# Patient Record
Sex: Female | Born: 1940 | Race: White | Hispanic: No | Marital: Single | Smoking: Former smoker
Health system: Southern US, Community
[De-identification: ages and names within clinical notes are randomized; demographics above are authoritative.]

## PROBLEM LIST (undated history)

## (undated) DIAGNOSIS — K219 Gastro-esophageal reflux disease without esophagitis: Secondary | ICD-10-CM

## (undated) DIAGNOSIS — D649 Anemia, unspecified: Secondary | ICD-10-CM

## (undated) DIAGNOSIS — M1990 Unspecified osteoarthritis, unspecified site: Secondary | ICD-10-CM

## (undated) DIAGNOSIS — E559 Vitamin D deficiency, unspecified: Secondary | ICD-10-CM

## (undated) DIAGNOSIS — Z86718 Personal history of other venous thrombosis and embolism: Secondary | ICD-10-CM

## (undated) DIAGNOSIS — I10 Essential (primary) hypertension: Secondary | ICD-10-CM

## (undated) DIAGNOSIS — J449 Chronic obstructive pulmonary disease, unspecified: Secondary | ICD-10-CM

## (undated) DIAGNOSIS — E119 Type 2 diabetes mellitus without complications: Secondary | ICD-10-CM

## (undated) DIAGNOSIS — R0789 Other chest pain: Secondary | ICD-10-CM

## (undated) DIAGNOSIS — B009 Herpesviral infection, unspecified: Secondary | ICD-10-CM

## (undated) DIAGNOSIS — N809 Endometriosis, unspecified: Secondary | ICD-10-CM

## (undated) DIAGNOSIS — Z87442 Personal history of urinary calculi: Secondary | ICD-10-CM

## (undated) DIAGNOSIS — E785 Hyperlipidemia, unspecified: Secondary | ICD-10-CM

## (undated) DIAGNOSIS — Z720 Tobacco use: Secondary | ICD-10-CM

## (undated) DIAGNOSIS — M8580 Other specified disorders of bone density and structure, unspecified site: Secondary | ICD-10-CM

## (undated) HISTORY — DX: Other chest pain: R07.89

## (undated) HISTORY — DX: Vitamin D deficiency, unspecified: E55.9

## (undated) HISTORY — PX: EYE SURGERY: SHX253

## (undated) HISTORY — DX: Personal history of other venous thrombosis and embolism: Z86.718

## (undated) HISTORY — PX: TONSILLECTOMY: SUR1361

## (undated) HISTORY — DX: Chronic obstructive pulmonary disease, unspecified: J44.9

## (undated) HISTORY — DX: Hyperlipidemia, unspecified: E78.5

## (undated) HISTORY — DX: Herpesviral infection, unspecified: B00.9

## (undated) HISTORY — DX: Anemia, unspecified: D64.9

## (undated) HISTORY — DX: Gastro-esophageal reflux disease without esophagitis: K21.9

## (undated) HISTORY — PX: HERNIA REPAIR: SHX51

## (undated) HISTORY — DX: Unspecified osteoarthritis, unspecified site: M19.90

## (undated) HISTORY — DX: Tobacco use: Z72.0

## (undated) HISTORY — PX: OTHER SURGICAL HISTORY: SHX169

## (undated) HISTORY — DX: Essential (primary) hypertension: I10

## (undated) HISTORY — DX: Endometriosis, unspecified: N80.9

## (undated) HISTORY — PX: APPENDECTOMY: SHX54

## (undated) HISTORY — DX: Type 2 diabetes mellitus without complications: E11.9

## (undated) HISTORY — DX: Other specified disorders of bone density and structure, unspecified site: M85.80

---

## 1998-02-17 ENCOUNTER — Ambulatory Visit: Admission: RE | Admit: 1998-02-17 | Discharge: 1998-02-17 | Payer: Self-pay | Admitting: Obstetrics and Gynecology

## 2000-04-11 ENCOUNTER — Encounter: Payer: Self-pay | Admitting: Obstetrics and Gynecology

## 2000-04-11 ENCOUNTER — Ambulatory Visit (HOSPITAL_COMMUNITY): Admission: RE | Admit: 2000-04-11 | Discharge: 2000-04-11 | Payer: Self-pay | Admitting: Obstetrics and Gynecology

## 2001-06-12 ENCOUNTER — Ambulatory Visit (HOSPITAL_COMMUNITY): Admission: RE | Admit: 2001-06-12 | Discharge: 2001-06-12 | Payer: Self-pay | Admitting: Gastroenterology

## 2001-08-06 ENCOUNTER — Other Ambulatory Visit: Admission: RE | Admit: 2001-08-06 | Discharge: 2001-08-06 | Payer: Self-pay | Admitting: Obstetrics and Gynecology

## 2001-08-13 ENCOUNTER — Encounter: Admission: RE | Admit: 2001-08-13 | Discharge: 2001-08-13 | Payer: Self-pay | Admitting: Obstetrics and Gynecology

## 2001-08-13 ENCOUNTER — Encounter: Payer: Self-pay | Admitting: Obstetrics and Gynecology

## 2002-10-11 ENCOUNTER — Other Ambulatory Visit: Admission: RE | Admit: 2002-10-11 | Discharge: 2002-10-11 | Payer: Self-pay | Admitting: Obstetrics and Gynecology

## 2002-10-25 ENCOUNTER — Encounter: Admission: RE | Admit: 2002-10-25 | Discharge: 2002-10-25 | Payer: Self-pay | Admitting: Obstetrics and Gynecology

## 2002-10-25 ENCOUNTER — Encounter: Payer: Self-pay | Admitting: Obstetrics and Gynecology

## 2003-01-30 ENCOUNTER — Encounter: Payer: Self-pay | Admitting: Obstetrics and Gynecology

## 2003-01-30 ENCOUNTER — Ambulatory Visit (HOSPITAL_COMMUNITY): Admission: RE | Admit: 2003-01-30 | Discharge: 2003-01-30 | Payer: Self-pay | Admitting: Obstetrics and Gynecology

## 2003-02-18 ENCOUNTER — Encounter (INDEPENDENT_AMBULATORY_CARE_PROVIDER_SITE_OTHER): Payer: Self-pay

## 2003-02-18 ENCOUNTER — Ambulatory Visit (HOSPITAL_COMMUNITY): Admission: RE | Admit: 2003-02-18 | Discharge: 2003-02-18 | Payer: Self-pay | Admitting: Obstetrics and Gynecology

## 2003-05-09 ENCOUNTER — Ambulatory Visit (HOSPITAL_COMMUNITY): Admission: RE | Admit: 2003-05-09 | Discharge: 2003-05-09 | Payer: Self-pay | Admitting: Internal Medicine

## 2004-01-04 ENCOUNTER — Encounter: Admission: RE | Admit: 2004-01-04 | Discharge: 2004-01-04 | Payer: Self-pay | Admitting: Internal Medicine

## 2004-01-11 ENCOUNTER — Inpatient Hospital Stay (HOSPITAL_COMMUNITY): Admission: AD | Admit: 2004-01-11 | Discharge: 2004-01-12 | Payer: Self-pay | Admitting: Interventional Cardiology

## 2004-01-20 ENCOUNTER — Ambulatory Visit (HOSPITAL_COMMUNITY): Admission: RE | Admit: 2004-01-20 | Discharge: 2004-01-20 | Payer: Self-pay | Admitting: Cardiology

## 2005-04-19 ENCOUNTER — Encounter: Admission: RE | Admit: 2005-04-19 | Discharge: 2005-04-19 | Payer: Self-pay | Admitting: Family Medicine

## 2005-04-22 ENCOUNTER — Encounter: Admission: RE | Admit: 2005-04-22 | Discharge: 2005-04-22 | Payer: Self-pay | Admitting: Family Medicine

## 2005-05-07 ENCOUNTER — Encounter: Admission: RE | Admit: 2005-05-07 | Discharge: 2005-05-07 | Payer: Self-pay | Admitting: Family Medicine

## 2006-06-24 ENCOUNTER — Encounter: Admission: RE | Admit: 2006-06-24 | Discharge: 2006-06-24 | Payer: Self-pay | Admitting: Obstetrics and Gynecology

## 2007-07-07 ENCOUNTER — Encounter: Admission: RE | Admit: 2007-07-07 | Discharge: 2007-07-07 | Payer: Self-pay | Admitting: Internal Medicine

## 2007-07-14 ENCOUNTER — Encounter: Admission: RE | Admit: 2007-07-14 | Discharge: 2007-07-14 | Payer: Self-pay | Admitting: Internal Medicine

## 2008-01-13 ENCOUNTER — Encounter: Admission: RE | Admit: 2008-01-13 | Discharge: 2008-01-13 | Payer: Self-pay | Admitting: Internal Medicine

## 2008-07-11 ENCOUNTER — Encounter: Admission: RE | Admit: 2008-07-11 | Discharge: 2008-07-11 | Payer: Self-pay | Admitting: Internal Medicine

## 2008-08-11 ENCOUNTER — Encounter: Admission: RE | Admit: 2008-08-11 | Discharge: 2008-08-11 | Payer: Self-pay | Admitting: Obstetrics and Gynecology

## 2009-03-24 ENCOUNTER — Ambulatory Visit (HOSPITAL_COMMUNITY): Admission: RE | Admit: 2009-03-24 | Discharge: 2009-03-24 | Payer: Self-pay | Admitting: Internal Medicine

## 2009-07-25 ENCOUNTER — Encounter: Admission: RE | Admit: 2009-07-25 | Discharge: 2009-07-25 | Payer: Self-pay | Admitting: Internal Medicine

## 2010-09-02 ENCOUNTER — Encounter: Payer: Self-pay | Admitting: Gastroenterology

## 2010-09-07 ENCOUNTER — Other Ambulatory Visit: Payer: Self-pay | Admitting: Internal Medicine

## 2010-09-07 DIAGNOSIS — Z1239 Encounter for other screening for malignant neoplasm of breast: Secondary | ICD-10-CM

## 2010-10-04 ENCOUNTER — Other Ambulatory Visit: Payer: Self-pay | Admitting: Internal Medicine

## 2010-10-04 ENCOUNTER — Ambulatory Visit
Admission: RE | Admit: 2010-10-04 | Discharge: 2010-10-04 | Disposition: A | Discharge status: Home / Self Care | Payer: Medicare Other | Source: Ambulatory Visit | Attending: Internal Medicine | Admitting: Internal Medicine

## 2010-10-04 DIAGNOSIS — Z1239 Encounter for other screening for malignant neoplasm of breast: Secondary | ICD-10-CM

## 2010-10-04 DIAGNOSIS — Z1231 Encounter for screening mammogram for malignant neoplasm of breast: Secondary | ICD-10-CM

## 2010-12-28 NOTE — Procedures (Signed)
[place]. [facility]  Patient:    Christina, Bennett Visit Number: [id number] MRN: [id number]          Service Type: Attending:  Verlin Grills, M.D. Dictated by:   Verlin Grills, M.D. Proc. Date: 06/12/01   CC:         Tyson Dense, M.D.   Procedure Report  REFERRING PHYSICIAN:  Tyson Dense, M.D.  PROCEDURE:  Proctocolonoscopy to the distal ascending colon.  PROCEDURE INDICATION:  [name] (date of birth [birth year]) is a 70 year old female who is due for her first surveillance colonoscopy with polypectomy to prevent colon cancer.  I discussed with Christina Bennett the complications associated with colonoscopy and polypectomy, including a 15 per thousand risk of bleeding and four per thousand risk of colon rupture requiring emergency surgery.  Christina Bennett has signed the operative permit.  ENDOSCOPIST:  Verlin Grills, M.D.  PREMEDICATION:  Demerol 50 mg, Versed 9 mg.  ENDOSCOPE:  Olympus pediatric colonoscope.  DESCRIPTION OF PROCEDURE:  After obtaining informed consent, Christina Bennett was placed in the left lateral decubitus position.  I administered intravenous Versed and intravenous Demerol to achieve conscious sedation for the procedure.  The patients blood pressure, oxygen saturation, and cardiac rhythm were monitored throughout the procedure and documented in the medical record.  Anal inspection was normal.  Digital rectal exam was normal.  The Olympus pediatric video colonoscope was introduced into the rectum and advanced to the distal ascending colon.  Due to colonic loop formation which could not be controlled with external abdominal pressure or by repositioning the patient from the left lateral decubitus position to the supine position and finally to the right lateral decubitus position, I was unable to examine the ascending colon, cecum, or ileocecal valve.  Colonic preparation for the exam today  was excellent.  Rectum normal.  Sigmoid colon and descending colon normal.  Splenic flexure normal.  Transverse colon normal.  Hepatic flexure normal.  Distal ascending colon normal.  Ascending colon, cecum, and ileocecal valve were not examined.  ASSESSMENT:  Normal screening proctocolonoscopy to the distal ascending colon. A complete colonoscopy was not performed due to colonic loop formation. Dictated by:   Verlin Grills, M.D. Attending:  Verlin Grills, M.D. DD:  06/12/01 TD:  06/13/01 Job: [id number]

## 2010-12-28 NOTE — Op Note (Signed)
NAME:  Christina Bennett, Christina Bennett                        ACCOUNT NO.:  [id number]   MEDICAL RECORD NO.:  [id number]                   PATIENT TYPE:  AMB   LOCATION:  SDC                                  FACILITY:  [facility]   PHYSICIAN:  Maxie Better, M.D.            DATE OF BIRTH:  1941-06-12   DATE OF PROCEDURE:  02/18/2003  DATE OF DISCHARGE:                                 OPERATIVE REPORT   PREOPERATIVE DIAGNOSIS:  Thickened endometrium, post menopausal patient.   POSTOPERATIVE DIAGNOSIS:  Thickened endometrium, post menopausal patient,  pending final pathology.   PROCEDURE:  Examination under anesthesia, endometrial biopsy.   ANESTHESIA:  General paracervical block.   SURGEON:  Maxie Better, M.D.   INDICATIONS FOR PROCEDURE:  This is a 70 year old gravida 0 post menopausal  patient who was found on CAT scan to have a thickened endometrium measuring  somewhere between 6 to 9 mm and confirmed by ultrasound who now presents for  evaluation.  The patient has a narrow vagina and stenotic os and was unable  to tolerate the procedure in the office.  The patient has had no post  menopausal bleeding.  The The risks and benefits of the procedure had been  explained to the patient, consent was signed, and the patient was  transferred to the operating room.   PROCEDURE:  Under adequate general anesthesia, the patient was placed in the  dorsal lithotomy position.  Examination under anesthesia with one digital  finger was notable for a small anteverted uterus, no adnexal masses were  appreciated, and a narrow vagina.  The patient was sterilely prepped and  draped in the usual fashion.  The bladder was catheterized for a moderate  amount of urine.  A Peterson speculum was placed in the vagina.  The cervix  was almost flush to the vaginal wall and deviated to the right.  A single  tooth tenaculum was placed initially attempted on the anterior lip of the  cervix, however, due to the flush  presentation of the cervix to the vagina,  the tenaculum was moved to the posterior lip which was then grasped.  A  small dilator was then utilized to try to traverse the internal os.  Now,  the single tooth tenaculum was placed on the anterior lip of the cervix and  the posterior tenaculum was removed.  This allowed for counter traction.  Again, the small dilator was gently used to probe the internal os which  subsequently suggested the internal os being traversed at which time the  Unimar from my office was utilized with the depth of sound to 5 cm as per  the CAT scan report and ultrasound dimension of the uterus to traverse the  internal os and to biopsy the endometrial cavity.  It appeared that some  tissue was obtained at which time the Unimar was removed.  The cervix was  then blocked with 10 mL of 1% Nesacaine  for additional postop management as  the patient is allergic to aspirin and all anti-inflammatory agents.  All  instruments were removed from the vagina.  The specimen was labeled  endometrial curettings/endometrial biopsy, and was sent to pathology.  Estimated blood loss minimal.  Complications were none.  The patient  tolerated the procedure well and was transferred to the recovery room in  stable condition.                                               Maxie Better, M.D.   [place]/MEDQ  D:  02/18/2003  T:  02/18/2003  Job:  [id number]

## 2010-12-28 NOTE — Discharge Summary (Signed)
NAME:  Christina Bennett, Christina Bennett                        ACCOUNT NO.:  [id number]   MEDICAL RECORD NO.:  [id number]                   PATIENT TYPE:  INP   LOCATION:  2029                                 FACILITY:  [facility]   PHYSICIAN:  Lyn Records, M.D.                DATE OF BIRTH:  1941-07-13   DATE OF ADMISSION:  01/11/2004  DATE OF DISCHARGE:  01/12/2004                                 DISCHARGE SUMMARY   CHIEF COMPLAINT AND REASON FOR ADMISSION:  Christina Bennett is a 70 year old  female patient with a 10-12 day history of substernal left chest pain that  radiated to the back, neck, and left arm.  She had CT angiogram of the chest  on Jan 04, 2004 which was negative for PE.  She was referred by Dr. Donette Larry  for a stress Cardiolite today.  This revealed an abnormal resting EKG with  diffuse ST segment depression in the inferior leads and the lateral leads in  V4-V6.  She was also having waxing and waning atypical chest pain.  Stress  Cardiolite was negative for ischemia but after exercise EKG became markedly  more abnormal.  Based on these findings, Dr. Katrinka Blazing felt it best to proceed  with diagnostic coronary angiogram.   The patient was admitted with the following diagnoses:  1. Chest pain with abnormal EKG, rule out coronary artery disease.  2. Cardiac risk factors of age, tobacco abuse, and positive family history.  3. Hypertension.   HOSPITAL COURSE:  Chest pain.  The patient was admitted to the telemetry  unit with subsequent IV heparin, IV nitroglycerin, low-dose beta-blocker.  Serial cardiac isoenzymes were checked.  BUN and creatinine were checked as  well as potassium and all of this was within normal limits.  The patient  continued to have some chest pain during the first 24 hours of admission  requiring increasing IV nitroglycerin up to 6 mL an hour.  BP remained  stable on this dosage.  The patient underwent cardiac catheterization on  January 12, 2004.  This showed no significant CAD  with normal LV function but  she did have significant coronary artery calcifications.  The patient was  deemed ready for discharge home later that afternoon.   FINAL DISCHARGE DIAGNOSES:  1. Chest pain with a normal cardiac catheterization.  2. Hypertension, controlled.   DISCHARGE MEDICATIONS:  1. Hydrochlorothiazide 12.5 mg daily.  2. Zocor 40 mg daily.  3. Miacalcin spray as previous.  4. Darvocet p.r.n.   ACTIVITY:  No bending, stooping, or straining or lifting greater than 5-10  pounds for the next two days.   DIET:  Cardiac.   WOUND CARE:  Shower only in the next two days.   FOLLOWUP APPOINTMENT:  She needs to see the nurse practitioner at our office  on Friday, June 10, at 10:30 a.m. for a groin check.      Allison L. Rennis Harding, N.P.  Lyn Records, M.D.    ALE/MEDQ  D:  02/06/2004  T:  02/06/2004  Job:  [id number]   cc:   Georgann Housekeeper, M.D.  [address]  [place]  [name] [address]  Fax: [phone]

## 2010-12-28 NOTE — Cardiovascular Report (Signed)
NAME:  Christina Bennett, Christina Bennett                        ACCOUNT NO.:  [id number]   MEDICAL RECORD NO.:  [id number]                   PATIENT TYPE:  INP   LOCATION:  2029                                 FACILITY:  [facility]   PHYSICIAN:  Lesleigh Noe, M.D.            DATE OF BIRTH:  08-31-1940   DATE OF PROCEDURE:  01/12/2004  DATE OF DISCHARGE:  01/12/2004                              CARDIAC CATHETERIZATION   INDICATIONS FOR PROCEDURE:  The patient has had an abnormal appearing EKG,  recurring chest discomfort and post exercise test exacerbation of diffuse ST-  T wave abnormality.  The test is being done to rule out significant coronary  disease.   PROCEDURE PERFORMED:  1. Left heart catheterization.  2. Selective coronary angiography.  3. Left ventriculography.  4. Angio-Seal arteriotomy closure.   DESCRIPTION:  After informed consent, a 6-French sheath was placed in the  right femoral artery using modified Seldinger technique.  A 6-French A2  multipurpose catheter was used for hemodynamic recordings, left  ventriculography by hand injection and selective right coronary angiography.  A #4 6 French left Judkins catheter was used for left coronary angiography.  The patient tolerated the diagnostic procedure without complications.  A  sheathogram was performed in the right femoral and arteriotomy closure with  Angio-Seal was performed without complications.   RESULTS:   I. HEMODYNAMIC DATA:  A.  Left ventricular pressure 116/5 mmHg.  B.  Aortic pressure 116/66 mmHg.   II. LEFT VENTRICULOGRAPHY:  The left ventricle is normal in size and  demonstrates normal contractility.  EF is 65%.   III. CORONARY ANGIOGRAPHY:  A.  Left main coronary:  Left main is free of  any significant obstruction.  Left main is relatively short.  B.  Left anterior descending coronary:  The LAD is transapical.  Proximal  luminal irregularities are noted.  There are luminal irregularities noted.  Up to 40-50%  narrowing is noted in the mid vessel after the first  significant diagonal.  No high grade obstruction is felt to be present.  C.  Circumflex artery:  Circumflex artery is large.  It gives origin to a  branching obtuse marginal.  No significant obstruction is noted.  D.  Right  coronary:  The right coronary artery is dominant giving origin to PDA and  several small left ventricular branches.  Irregularity is noted in the mid  vessel with up to 30% narrowing.  No high grade obstruction is seen.   CONCLUSION:  1. There is 50% mid LAD stenosis.  Luminal irregularities are noted in the     mid right coronary.  No high grade obstructive lesions are noted     throughout the coronary arterial tree.  2. Normal left ventricular function.  3. EKG abnormalities, probably metabolic in origin or related to     hyperventilation.  Certainly, no high grade lesions are noted.   PLAN:  Aggressive risk factor modification  including aspirin, statin and  requested the patient discontinue smoking.                                               Lesleigh Noe, M.D.    HWS/MEDQ  D:  01/12/2004  T:  01/13/2004  Job:  [id number]

## 2011-07-08 ENCOUNTER — Other Ambulatory Visit: Payer: Self-pay | Admitting: Gastroenterology

## 2011-07-16 ENCOUNTER — Other Ambulatory Visit: Payer: Self-pay | Admitting: Gastroenterology

## 2011-07-16 DIAGNOSIS — Z86010 Personal history of colonic polyps: Secondary | ICD-10-CM

## 2011-07-18 ENCOUNTER — Other Ambulatory Visit: Payer: Self-pay | Admitting: Gastroenterology

## 2011-07-18 DIAGNOSIS — K635 Polyp of colon: Secondary | ICD-10-CM

## 2011-08-19 ENCOUNTER — Other Ambulatory Visit: Payer: Medicare Other

## 2011-09-09 DIAGNOSIS — K219 Gastro-esophageal reflux disease without esophagitis: Secondary | ICD-10-CM | POA: Diagnosis not present

## 2011-09-09 DIAGNOSIS — R7309 Other abnormal glucose: Secondary | ICD-10-CM | POA: Diagnosis not present

## 2011-09-09 DIAGNOSIS — I10 Essential (primary) hypertension: Secondary | ICD-10-CM | POA: Diagnosis not present

## 2011-09-09 DIAGNOSIS — E782 Mixed hyperlipidemia: Secondary | ICD-10-CM | POA: Diagnosis not present

## 2011-09-09 DIAGNOSIS — M949 Disorder of cartilage, unspecified: Secondary | ICD-10-CM | POA: Diagnosis not present

## 2011-09-09 DIAGNOSIS — M899 Disorder of bone, unspecified: Secondary | ICD-10-CM | POA: Diagnosis not present

## 2011-09-09 DIAGNOSIS — J069 Acute upper respiratory infection, unspecified: Secondary | ICD-10-CM | POA: Diagnosis not present

## 2011-09-09 DIAGNOSIS — M1990 Unspecified osteoarthritis, unspecified site: Secondary | ICD-10-CM | POA: Diagnosis not present

## 2011-09-09 DIAGNOSIS — J449 Chronic obstructive pulmonary disease, unspecified: Secondary | ICD-10-CM | POA: Diagnosis not present

## 2011-09-12 DIAGNOSIS — E782 Mixed hyperlipidemia: Secondary | ICD-10-CM | POA: Diagnosis not present

## 2011-09-12 DIAGNOSIS — IMO0001 Reserved for inherently not codable concepts without codable children: Secondary | ICD-10-CM | POA: Diagnosis not present

## 2011-09-18 ENCOUNTER — Other Ambulatory Visit: Payer: Self-pay | Admitting: Internal Medicine

## 2011-09-18 DIAGNOSIS — Z1231 Encounter for screening mammogram for malignant neoplasm of breast: Secondary | ICD-10-CM

## 2011-10-09 ENCOUNTER — Ambulatory Visit
Admission: RE | Admit: 2011-10-09 | Discharge: 2011-10-09 | Disposition: A | Discharge status: Home / Self Care | Payer: Medicare Other | Source: Ambulatory Visit | Attending: Internal Medicine | Admitting: Internal Medicine

## 2011-10-09 DIAGNOSIS — Z1231 Encounter for screening mammogram for malignant neoplasm of breast: Secondary | ICD-10-CM

## 2011-10-23 DIAGNOSIS — M899 Disorder of bone, unspecified: Secondary | ICD-10-CM | POA: Diagnosis not present

## 2011-10-23 DIAGNOSIS — M949 Disorder of cartilage, unspecified: Secondary | ICD-10-CM | POA: Diagnosis not present

## 2011-11-04 DIAGNOSIS — H01009 Unspecified blepharitis unspecified eye, unspecified eyelid: Secondary | ICD-10-CM | POA: Diagnosis not present

## 2011-11-04 DIAGNOSIS — D3130 Benign neoplasm of unspecified choroid: Secondary | ICD-10-CM | POA: Diagnosis not present

## 2011-11-04 DIAGNOSIS — H2510 Age-related nuclear cataract, unspecified eye: Secondary | ICD-10-CM | POA: Diagnosis not present

## 2011-11-04 DIAGNOSIS — B0052 Herpesviral keratitis: Secondary | ICD-10-CM | POA: Diagnosis not present

## 2011-11-11 DIAGNOSIS — Z01419 Encounter for gynecological examination (general) (routine) without abnormal findings: Secondary | ICD-10-CM | POA: Diagnosis not present

## 2011-11-11 DIAGNOSIS — Z124 Encounter for screening for malignant neoplasm of cervix: Secondary | ICD-10-CM | POA: Diagnosis not present

## 2011-11-14 DIAGNOSIS — IMO0001 Reserved for inherently not codable concepts without codable children: Secondary | ICD-10-CM | POA: Diagnosis not present

## 2011-12-04 DIAGNOSIS — E782 Mixed hyperlipidemia: Secondary | ICD-10-CM | POA: Diagnosis not present

## 2011-12-04 DIAGNOSIS — I10 Essential (primary) hypertension: Secondary | ICD-10-CM | POA: Diagnosis not present

## 2011-12-04 DIAGNOSIS — E119 Type 2 diabetes mellitus without complications: Secondary | ICD-10-CM | POA: Diagnosis not present

## 2011-12-04 DIAGNOSIS — K219 Gastro-esophageal reflux disease without esophagitis: Secondary | ICD-10-CM | POA: Diagnosis not present

## 2012-01-13 DIAGNOSIS — H0230 Blepharochalasis unspecified eye, unspecified eyelid: Secondary | ICD-10-CM | POA: Diagnosis not present

## 2012-01-13 DIAGNOSIS — E119 Type 2 diabetes mellitus without complications: Secondary | ICD-10-CM | POA: Diagnosis not present

## 2012-01-13 DIAGNOSIS — H2510 Age-related nuclear cataract, unspecified eye: Secondary | ICD-10-CM | POA: Diagnosis not present

## 2012-01-13 DIAGNOSIS — B0052 Herpesviral keratitis: Secondary | ICD-10-CM | POA: Diagnosis not present

## 2012-03-11 DIAGNOSIS — M899 Disorder of bone, unspecified: Secondary | ICD-10-CM | POA: Diagnosis not present

## 2012-03-11 DIAGNOSIS — I10 Essential (primary) hypertension: Secondary | ICD-10-CM | POA: Diagnosis not present

## 2012-03-11 DIAGNOSIS — K219 Gastro-esophageal reflux disease without esophagitis: Secondary | ICD-10-CM | POA: Diagnosis not present

## 2012-03-11 DIAGNOSIS — Z0000 Encounter for general adult medical examination without abnormal findings: Secondary | ICD-10-CM | POA: Diagnosis not present

## 2012-03-11 DIAGNOSIS — J449 Chronic obstructive pulmonary disease, unspecified: Secondary | ICD-10-CM | POA: Diagnosis not present

## 2012-03-11 DIAGNOSIS — D649 Anemia, unspecified: Secondary | ICD-10-CM | POA: Diagnosis not present

## 2012-03-11 DIAGNOSIS — M949 Disorder of cartilage, unspecified: Secondary | ICD-10-CM | POA: Diagnosis not present

## 2012-03-11 DIAGNOSIS — Z1331 Encounter for screening for depression: Secondary | ICD-10-CM | POA: Diagnosis not present

## 2012-03-11 DIAGNOSIS — E782 Mixed hyperlipidemia: Secondary | ICD-10-CM | POA: Diagnosis not present

## 2012-03-11 DIAGNOSIS — E119 Type 2 diabetes mellitus without complications: Secondary | ICD-10-CM | POA: Diagnosis not present

## 2012-04-03 ENCOUNTER — Ambulatory Visit (INDEPENDENT_AMBULATORY_CARE_PROVIDER_SITE_OTHER): Payer: Medicare Other | Admitting: Surgery

## 2012-04-14 DIAGNOSIS — D649 Anemia, unspecified: Secondary | ICD-10-CM | POA: Diagnosis not present

## 2012-04-15 ENCOUNTER — Telehealth: Payer: Self-pay | Admitting: Oncology

## 2012-04-15 NOTE — Telephone Encounter (Signed)
S/W pt mother in re NP appt 9/12 @ 10:30 w/Dr. Clelia Croft Referring Dr. Georgann Housekeeper Dx- Donia Pounds NP packet mailed out.

## 2012-04-15 NOTE — Telephone Encounter (Signed)
C/D on 9/4 for visit on 9/12

## 2012-04-22 ENCOUNTER — Other Ambulatory Visit: Payer: Self-pay | Admitting: Oncology

## 2012-04-22 DIAGNOSIS — D649 Anemia, unspecified: Secondary | ICD-10-CM

## 2012-04-23 ENCOUNTER — Telehealth: Payer: Self-pay | Admitting: Internal Medicine

## 2012-04-23 ENCOUNTER — Ambulatory Visit: Payer: Medicare Other

## 2012-04-23 ENCOUNTER — Other Ambulatory Visit (HOSPITAL_BASED_OUTPATIENT_CLINIC_OR_DEPARTMENT_OTHER): Payer: Medicare Other | Admitting: Lab

## 2012-04-23 ENCOUNTER — Ambulatory Visit (HOSPITAL_BASED_OUTPATIENT_CLINIC_OR_DEPARTMENT_OTHER): Payer: Medicare Other | Admitting: Oncology

## 2012-04-23 VITALS — BP 147/87 | HR 87 | Temp 97.8°F | Resp 20 | Ht 62.0 in | Wt 181.0 lb

## 2012-04-23 DIAGNOSIS — D72829 Elevated white blood cell count, unspecified: Secondary | ICD-10-CM

## 2012-04-23 DIAGNOSIS — D509 Iron deficiency anemia, unspecified: Secondary | ICD-10-CM

## 2012-04-23 DIAGNOSIS — D649 Anemia, unspecified: Secondary | ICD-10-CM

## 2012-04-23 DIAGNOSIS — Z86718 Personal history of other venous thrombosis and embolism: Secondary | ICD-10-CM | POA: Diagnosis not present

## 2012-04-23 LAB — COMPREHENSIVE METABOLIC PANEL (CC13)
ALT: <6 U/L (ref 0–55)
AST: 11 U/L (ref 5–34)
Albumin: 3.5 g/dL (ref 3.5–5.0)
Alkaline Phosphatase: 87 U/L (ref 40–150)
BUN: 11 mg/dL (ref 7.0–26.0)
CO2: 25 mEq/L (ref 22–29)
Calcium: 9.2 mg/dL (ref 8.4–10.4)
Chloride: 104 mEq/L (ref 98–107)
Creatinine: 0.7 mg/dL (ref 0.6–1.1)
Glucose: 121 mg/dl — ABNORMAL HIGH (ref 70–99)
Potassium: 3.7 mEq/L (ref 3.5–5.1)
Sodium: 140 mEq/L (ref 136–145)
Total Bilirubin: 0.4 mg/dL (ref 0.20–1.20)
Total Protein: 6.7 g/dL (ref 6.4–8.3)

## 2012-04-23 LAB — CBC WITH DIFFERENTIAL/PLATELET
BASO%: 0.5 % (ref 0.0–2.0)
Basophils Absolute: 0.1 10*3/uL (ref 0.0–0.1)
EOS%: 5.4 % (ref 0.0–7.0)
Eosinophils Absolute: 0.6 10*3/uL — ABNORMAL HIGH (ref 0.0–0.5)
HCT: 32.5 % — ABNORMAL LOW (ref 34.8–46.6)
HGB: 9.8 g/dL — ABNORMAL LOW (ref 11.6–15.9)
LYMPH%: 21.9 % (ref 14.0–49.7)
MCH: 21.5 pg — ABNORMAL LOW (ref 25.1–34.0)
MCHC: 30.3 g/dL — ABNORMAL LOW (ref 31.5–36.0)
MCV: 71.1 fL — ABNORMAL LOW (ref 79.5–101.0)
MONO#: 0.8 10*3/uL (ref 0.1–0.9)
MONO%: 6.8 % (ref 0.0–14.0)
NEUT#: 7.2 10*3/uL — ABNORMAL HIGH (ref 1.5–6.5)
NEUT%: 65.4 % (ref 38.4–76.8)
Platelets: 253 10*3/uL (ref 145–400)
RBC: 4.57 10*6/uL (ref 3.70–5.45)
RDW: 19.2 % — ABNORMAL HIGH (ref 11.2–14.5)
WBC: 11.1 10*3/uL — ABNORMAL HIGH (ref 3.9–10.3)
lymph#: 2.4 10*3/uL (ref 0.9–3.3)

## 2012-04-23 LAB — IRON AND TIBC
%SAT: 6 % — ABNORMAL LOW (ref 20–55)
Iron: 26 ug/dL — ABNORMAL LOW (ref 42–145)
TIBC: 413 ug/dL (ref 250–470)
UIBC: 387 ug/dL (ref 125–400)

## 2012-04-23 LAB — FERRITIN: Ferritin: 5 ng/mL — ABNORMAL LOW (ref 10–291)

## 2012-04-23 LAB — CHCC SMEAR

## 2012-04-23 NOTE — Progress Notes (Signed)
Note dictated

## 2012-04-23 NOTE — Telephone Encounter (Signed)
Gave pt appt for December 2013 lab and MD 

## 2012-04-23 NOTE — Progress Notes (Signed)
CC:   Georgann Housekeeper, MD  REASON FOR CONSULTATION:  Anemia and leukocytosis.  HISTORY OF PRESENT ILLNESS:  Christina Bennett is a pleasant 71 year old woman, currently of [place], lived the majority of her life around [place] area. 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  She has a past medical history significant for COPD, hypertension, and diabetes, but for the most part has been in relatively reasonable health.  She gets her routine medical care with Dr. Donette Larry at [facility] at [facility]  She has had a longstanding history of heavy menstrual cycles, as well as endometriosis that required a laparotomy and an ovarian ablation at a young age.  She also had developed DVTs and phlebitis mostly on oral contraceptive, but none recently that she reports.  Her most recent CBC noted on 10/12/2011 showed her white cell count was 12.0, upper limit of normal was 11, her hemoglobin was 9.7, her MCV was 71, RDW of 19.  Her differential of the white cells was within normal range.  Previous CBC back in July 2013 showed her hemoglobin was 9.5, white cell count 13.7. She had again normal differential and MCV of 69.8.  The patient was prescribed oral iron supplements, which she has been taking once a day. She is not reporting any major problems with this.  Does not report any constipation.  Has not reported any diarrhea.  Had not reported any dyspepsia.  Overall she has really not had any symptomatology.  Had not had any constitutional symptoms.  No major changes in her performance status.  She did have a colonoscopy this year that was unrevealing.  REVIEW OF SYSTEMS:  Does not report any headaches, blurred vision, double vision.  Does not report any motor or sensory neuropathy.  Does not report any alteration in mental status.  Does not report any psychiatric issues or depression.  Does not report any fever, chills, sweats.  Does not report  any cough, hemoptysis, hematemesis.  No nausea or vomiting.  No abdominal pain, hematochezia, melena, genitourinary complaints.  Rest of review of systems unremarkable.  PAST MEDICAL HISTORY:  Significant for hypertension, diabetes, hyperlipidemia.  Has history of COPD.  History of herpetic eye infection.  History of GERD, osteoarthritis, vitamin D deficiency, history of deep vein thrombosis, history of endometriosis in the 60s.  SURGICAL HISTORY:  She is status post appendectomy and laparotomy for endometriosis.  FAMILY HISTORY:  Father had bladder cancer, although unclear whether he died from that or complications of other issues.  Mother died in her 80s due to coronary disease.  No history of any other malignancies or blood disorders.  SOCIAL HISTORY:  She is single.  She lives with her sister.  Denied any alcohol or tobacco abuse at this time.  ALLERGIES:  She is allergic to aspirin, causes rash, as well as all NSAIDs cause rash.  PHYSICAL EXAMINATION:  General:  Alert, awake woman, appeared in no active distress.  Vital Signs:  Her blood pressure 147/87, pulse 87, respirations 20, temperature is 97.8, weighs 181 pounds.  ECOG performance status is 1.  HEENT:  Head is normocephalic, atraumatic. Pupils equal, round, reactive to light.  Oral mucosa moist and pink. Neck:  Supple without lymphadenopathy.  Heart:  Regular rate and rhythm, S1 and S2.  Lungs:  Clear to auscultation.  No rhonchi, wheezes, or dullness to percussion.  Abdomen:  Soft, nontender.  No hepatosplenomegaly.  Extremities:  No clubbing, cyanosis, or edema.  Neurological:  Intact motor, sensory, and deep tendon reflexes.  LABORATORY DATA:  Showed a hemoglobin of 9.8, white cells 11.1, MCV 71, RDW of 19.2.  She had normal differential.  A peripheral smear was personally reviewed today and showed evidence of microcytosis and hypochromia, but really no evidence of any other schistocytosis or red cell  fragmentation.  I do not see any evidence of any dysplasia.  ASSESSMENT AND PLAN:  This is a pleasant 71 year old woman with the following issues: 1. Microcytic, hypochromic anemia associated with elevated RDW.  This     is really suggestive of iron-deficiency anemia.  I am checking her     iron stores at this time.  She had been on iron replacement for the     last month with minimal change at this time.  Again, after checking     her iron stores and confirming if indeed that is what we are     dealing with, I have offered her theoretically options of     increasing her oral iron replacements to twice a day and possibly     consider IV iron if she did not have any improvement in her     hemoglobin or iron levels.  She is willing to try the p.o. iron for     now and I will re-evaluate that in 3 months. 2. Leukocytosis.  Differential diagnosis discussed today in detail     with Ms. Westman.  That includes a reactive leukocytosis due to any     recent illnesses, infections, current health status, and also     related to her iron deficiency.  A lymphoproliferative disorder or     myeloproliferative disorder is also a possibility.  I think it is     less likely, as her white cell count is actually decreasing on     recent checks.  I do not see any evidence of that on her lab     testing today, so I think it is less of a possibility.  But for the     time being, I will continue to observe that.  Repeat her counts in     about 3 months and we can certainly reconsider any further workup     if needed to, such as BCR-ABL, JAK2 mutation, possible bone marrow     biopsy.  I think that none of these are indicated at this time.     All her questions were answered today.    ______________________________ Benjiman Core, M.D. FNS/MEDQ  D:  04/23/2012  T:  04/23/2012  Job:  [id number]

## 2012-05-07 DIAGNOSIS — Z23 Encounter for immunization: Secondary | ICD-10-CM | POA: Diagnosis not present

## 2012-07-13 DIAGNOSIS — H02059 Trichiasis without entropian unspecified eye, unspecified eyelid: Secondary | ICD-10-CM | POA: Diagnosis not present

## 2012-07-13 DIAGNOSIS — H2510 Age-related nuclear cataract, unspecified eye: Secondary | ICD-10-CM | POA: Diagnosis not present

## 2012-07-13 DIAGNOSIS — H179 Unspecified corneal scar and opacity: Secondary | ICD-10-CM | POA: Diagnosis not present

## 2012-07-23 ENCOUNTER — Telehealth: Payer: Self-pay | Admitting: Oncology

## 2012-07-23 ENCOUNTER — Other Ambulatory Visit (HOSPITAL_BASED_OUTPATIENT_CLINIC_OR_DEPARTMENT_OTHER): Payer: Medicare Other | Admitting: Lab

## 2012-07-23 ENCOUNTER — Ambulatory Visit (HOSPITAL_BASED_OUTPATIENT_CLINIC_OR_DEPARTMENT_OTHER): Payer: Medicare Other | Admitting: Oncology

## 2012-07-23 VITALS — BP 140/85 | HR 83 | Temp 97.7°F | Resp 20 | Ht 62.0 in | Wt 182.6 lb

## 2012-07-23 DIAGNOSIS — D649 Anemia, unspecified: Secondary | ICD-10-CM

## 2012-07-23 DIAGNOSIS — D509 Iron deficiency anemia, unspecified: Secondary | ICD-10-CM

## 2012-07-23 LAB — CBC WITH DIFFERENTIAL/PLATELET
BASO%: 0.6 % (ref 0.0–2.0)
Basophils Absolute: 0.1 10*3/uL (ref 0.0–0.1)
EOS%: 6.2 % (ref 0.0–7.0)
Eosinophils Absolute: 0.6 10*3/uL — ABNORMAL HIGH (ref 0.0–0.5)
HCT: 37.1 % (ref 34.8–46.6)
HGB: 11.8 g/dL (ref 11.6–15.9)
LYMPH%: 23.6 % (ref 14.0–49.7)
MCH: 24.5 pg — ABNORMAL LOW (ref 25.1–34.0)
MCHC: 31.8 g/dL (ref 31.5–36.0)
MCV: 77 fL — ABNORMAL LOW (ref 79.5–101.0)
MONO#: 0.7 10*3/uL (ref 0.1–0.9)
MONO%: 6.4 % (ref 0.0–14.0)
NEUT#: 6.5 10*3/uL (ref 1.5–6.5)
NEUT%: 63.2 % (ref 38.4–76.8)
Platelets: 246 10*3/uL (ref 145–400)
RBC: 4.82 10*6/uL (ref 3.70–5.45)
RDW: 18.6 % — ABNORMAL HIGH (ref 11.2–14.5)
WBC: 10.4 10*3/uL — ABNORMAL HIGH (ref 3.9–10.3)
lymph#: 2.5 10*3/uL (ref 0.9–3.3)

## 2012-07-23 LAB — IRON AND TIBC
%SAT: 14 % — ABNORMAL LOW (ref 20–55)
Iron: 62 ug/dL (ref 42–145)
TIBC: 436 ug/dL (ref 250–470)
UIBC: 374 ug/dL (ref 125–400)

## 2012-07-23 LAB — FERRITIN: Ferritin: 9 ng/mL — ABNORMAL LOW (ref 10–291)

## 2012-07-23 NOTE — Telephone Encounter (Signed)
appts made and printed for pt Christina °

## 2012-07-23 NOTE — Progress Notes (Signed)
Hematology and Oncology Follow Up Visit  Christina Bennett [id number] 22-Aug-1940 71 y.o. 07/23/2012 10:29 AM   Principle Diagnosis: 71 year old with iron deficiency anemia diagnosed in 04/2012. Her GI work up is negative.   Current therapy: Oral iron replacement twice a day.   Interim History: Christina Bennett presents today for a follow up visit. She is a pleasant women with the above history. Since her last visit, she has been doing well. She is taking oral iron once to twice a day with out complications. Overall she has really not had any symptomatology. Had not had any constitutional symptoms. No major changes in her performance status. No bleeding noted at this time. She did report constipation at times with oral iron.    Medications: I have reviewed the patient's current medications. Current outpatient prescriptions:acyclovir (ZOVIRAX) 400 MG tablet, Take 400 mg by mouth daily., Disp: , Rfl: ;  albuterol (PROVENTIL HFA;VENTOLIN HFA) 108 (90 BASE) MCG/ACT inhaler, Inhale 2 puffs into the lungs every 4 (four) hours as needed., Disp: , Rfl: ;  budesonide-formoterol (SYMBICORT) 80-4.5 MCG/ACT inhaler, Inhale 2 puffs into the lungs 2 (two) times daily., Disp: , Rfl:  calcitonin, salmon, (MIACALCIN/FORTICAL) 200 UNIT/ACT nasal spray, Place 1 spray into the nose daily., Disp: , Rfl: ;  carboxymethylcellulose (REFRESH PLUS) 0.5 % SOLN, 1 drop at bedtime., Disp: , Rfl: ;  ferrous sulfate 325 (65 FE) MG tablet, Take 325 mg by mouth daily with breakfast., Disp: , Rfl: ;  fish oil-omega-3 fatty acids 1000 MG capsule, Take 3 g by mouth daily., Disp: , Rfl:  fluticasone (FLOVENT DISKUS) 50 MCG/BLIST diskus inhaler, Inhale 2 puffs into the lungs daily., Disp: , Rfl: ;  hydrochlorothiazide (HYDRODIURIL) 25 MG tablet, Take 12.5 mg by mouth daily., Disp: , Rfl: ;  metFORMIN (GLUCOPHAGE) 500 MG tablet, Take 500 mg by mouth 2 (two) times daily with a meal., Disp: , Rfl: ;  omeprazole (PRILOSEC) 10 MG capsule, Take 10 mg  by mouth daily. Unsure of dose, Disp: , Rfl:  simvastatin (ZOCOR) 40 MG tablet, Take 40 mg by mouth every evening., Disp: , Rfl: ;  Vitamin D, Ergocalciferol, (DRISDOL) 50000 UNITS CAPS, Take 50,000 Units by mouth every 14 (fourteen) days., Disp: , Rfl:   Allergies:  Allergies  Allergen Reactions  . Ibuprofen     Past Medical History, Surgical history, Social history, and Family History were reviewed and updated.  Review of Systems: Constitutional:  Negative for fever, chills, night sweats, anorexia, weight loss, pain. Cardiovascular: no chest pain or dyspnea on exertion Respiratory: negative Neurological: negative Dermatological: negative ENT: negative Skin: Negative. Gastrointestinal: negative Genito-Urinary: negative Hematological and Lymphatic: negative Breast: negative Musculoskeletal: negative Remaining ROS negative. Physical Exam: Blood pressure 140/85, pulse 83, temperature 97.7 F (36.5 C), temperature source Oral, resp. rate 20, height 5\' 2"  (1.575 m), weight 182 lb 9.6 oz (82.827 kg). ECOG: 0 General appearance: alert Head: Normocephalic, without obvious abnormality, atraumatic Neck: no adenopathy, no carotid bruit, no JVD, supple, symmetrical, trachea midline and thyroid not enlarged, symmetric, no tenderness/mass/nodules Lymph nodes: Cervical, supraclavicular, and axillary nodes normal. Heart:regular rate and rhythm, S1, S2 normal, no murmur, click, rub or gallop Lung:chest clear, no wheezing, rales, normal symmetric air entry Abdomin: soft, non-tender, without masses or organomegaly EXT:no erythema, induration, or nodules   Lab Results: Lab Results  Component Value Date   WBC 10.4* 07/23/2012   HGB 11.8 07/23/2012   HCT 37.1 07/23/2012   MCV 77.0* 07/23/2012   PLT 246 07/23/2012  Chemistry      Component Value Date/Time   NA 140 04/23/2012 1017   K 3.7 04/23/2012 1017   CL 104 04/23/2012 1017   CO2 25 04/23/2012 1017   BUN 11.0 04/23/2012 1017    CREATININE 0.7 04/23/2012 1017      Component Value Date/Time   CALCIUM 9.2 04/23/2012 1017   ALKPHOS 87 04/23/2012 1017   AST 11 04/23/2012 1017   ALT <6 Repeated and Verified 04/23/2012 1017   BILITOT 0.40 04/23/2012 1017      Impression and Plan:  This is a pleasant 71 year old woman with the  following issues:  1. Microcytic, hypochromic anemia likely due to iron deficiency anemia. She is on oral iron with an excellent response. Her Hgb today back to normal.  I reccommended that she continues with oral iron once a day.  I will repeat her counts in 3 months. If her hgb remains normal she will follow up as needed.  If her counts drop, we will consider IV iron.  2. Leukocytosis. This is likely reactive and is resolving.      Miliano Cotten, MD 12/12/201310:29 AM

## 2012-09-21 DIAGNOSIS — E782 Mixed hyperlipidemia: Secondary | ICD-10-CM | POA: Diagnosis not present

## 2012-09-21 DIAGNOSIS — M1990 Unspecified osteoarthritis, unspecified site: Secondary | ICD-10-CM | POA: Diagnosis not present

## 2012-09-21 DIAGNOSIS — J449 Chronic obstructive pulmonary disease, unspecified: Secondary | ICD-10-CM | POA: Diagnosis not present

## 2012-09-21 DIAGNOSIS — E119 Type 2 diabetes mellitus without complications: Secondary | ICD-10-CM | POA: Diagnosis not present

## 2012-09-21 DIAGNOSIS — I10 Essential (primary) hypertension: Secondary | ICD-10-CM | POA: Diagnosis not present

## 2012-09-30 ENCOUNTER — Other Ambulatory Visit: Payer: Self-pay | Admitting: Internal Medicine

## 2012-09-30 DIAGNOSIS — Z1231 Encounter for screening mammogram for malignant neoplasm of breast: Secondary | ICD-10-CM

## 2012-10-21 ENCOUNTER — Other Ambulatory Visit (HOSPITAL_BASED_OUTPATIENT_CLINIC_OR_DEPARTMENT_OTHER): Payer: Medicare Other | Admitting: Lab

## 2012-10-21 ENCOUNTER — Telehealth: Payer: Self-pay | Admitting: Oncology

## 2012-10-21 ENCOUNTER — Encounter: Payer: Self-pay | Admitting: Oncology

## 2012-10-21 ENCOUNTER — Ambulatory Visit (HOSPITAL_BASED_OUTPATIENT_CLINIC_OR_DEPARTMENT_OTHER): Payer: Medicare Other | Admitting: Oncology

## 2012-10-21 VITALS — BP 161/72 | HR 85 | Temp 98.4°F | Resp 18 | Ht 62.0 in | Wt 183.2 lb

## 2012-10-21 DIAGNOSIS — D509 Iron deficiency anemia, unspecified: Secondary | ICD-10-CM | POA: Insufficient documentation

## 2012-10-21 DIAGNOSIS — D72829 Elevated white blood cell count, unspecified: Secondary | ICD-10-CM | POA: Diagnosis not present

## 2012-10-21 DIAGNOSIS — D649 Anemia, unspecified: Secondary | ICD-10-CM

## 2012-10-21 LAB — CBC WITH DIFFERENTIAL/PLATELET
BASO%: 1 % (ref 0.0–2.0)
Basophils Absolute: 0.1 10*3/uL (ref 0.0–0.1)
EOS%: 7.9 % — ABNORMAL HIGH (ref 0.0–7.0)
Eosinophils Absolute: 0.8 10*3/uL — ABNORMAL HIGH (ref 0.0–0.5)
HCT: 35.7 % (ref 34.8–46.6)
HGB: 11.3 g/dL — ABNORMAL LOW (ref 11.6–15.9)
LYMPH%: 23.8 % (ref 14.0–49.7)
MCH: 24.1 pg — ABNORMAL LOW (ref 25.1–34.0)
MCHC: 31.5 g/dL (ref 31.5–36.0)
MCV: 76.5 fL — ABNORMAL LOW (ref 79.5–101.0)
MONO#: 0.7 10*3/uL (ref 0.1–0.9)
MONO%: 6.5 % (ref 0.0–14.0)
NEUT#: 6.4 10*3/uL (ref 1.5–6.5)
NEUT%: 60.8 % (ref 38.4–76.8)
Platelets: 243 10*3/uL (ref 145–400)
RBC: 4.67 10*6/uL (ref 3.70–5.45)
RDW: 16.2 % — ABNORMAL HIGH (ref 11.2–14.5)
WBC: 10.5 10*3/uL — ABNORMAL HIGH (ref 3.9–10.3)
lymph#: 2.5 10*3/uL (ref 0.9–3.3)

## 2012-10-21 LAB — IRON AND TIBC
%SAT: 16 % — ABNORMAL LOW (ref 20–55)
Iron: 65 ug/dL (ref 42–145)
TIBC: 417 ug/dL (ref 250–470)
UIBC: 352 ug/dL (ref 125–400)

## 2012-10-21 LAB — FERRITIN: Ferritin: 10 ng/mL (ref 10–291)

## 2012-10-21 NOTE — Telephone Encounter (Signed)
gv and printed appt schedule for pt for June °

## 2012-10-21 NOTE — Patient Instructions (Signed)
Iron-Rich Diet  An iron-rich diet contains foods that are good sources of iron. Iron is an important mineral that helps your body produce hemoglobin. Hemoglobin is a protein in red blood cells that carries oxygen to the body's tissues. Sometimes, the iron level in your blood can be low. This may be caused by:  · A lack of iron in your diet.  · Blood loss.  · Times of growth, such as during pregnancy or during a child's growth and development.  Low levels of iron can cause a decrease in the number of red blood cells. This can result in iron deficiency anemia. Iron deficiency anemia symptoms include:  · Tiredness.  · Weakness.  · Irritability.  · Increased chance of infection.  Here are some recommendations for daily iron intake:  · Males older than 70 years of age need 8 mg of iron per day.  · Women ages 19 to 50 need 18 mg of iron per day.  · Pregnant women need 27 mg of iron per day, and women who are over 19 years of age and breastfeeding need 9 mg of iron per day.  · Women over the age of 50 need 8 mg of iron per day.  SOURCES OF IRON  There are 2 types of iron that are found in food: heme iron and nonheme iron. Heme iron is absorbed by the body better than nonheme iron. Heme iron is found in meat, poultry, and fish. Nonheme iron is found in grains, beans, and vegetables.  Heme Iron Sources  Food / Iron (mg)  · Chicken liver, 3 oz (85 g)/ 10 mg  · Beef liver, 3 oz (85 g)/ 5.5 mg  · Oysters, 3 oz (85 g)/ 8 mg  · Beef, 3 oz (85 g)/ 2 to 3 mg  · Shrimp, 3 oz (85 g)/ 2.8 mg  · Turkey, 3 oz (85 g)/ 2 mg  · Chicken, 3 oz (85 g) / 1 mg  · Fish (tuna, halibut), 3 oz (85 g)/ 1 mg  · Pork, 3 oz (85 g)/ 0.9 mg  Nonheme Iron Sources  Food / Iron (mg)  · Ready-to-eat breakfast cereal, iron-fortified / 3.9 to 7 mg  · Tofu, ½ cup / 3.4 mg  · Kidney beans, ½ cup / 2.6 mg  · Baked potato with skin / 2.7 mg  · Asparagus, ½ cup / 2.2 mg  · Avocado / 2 mg  · Dried peaches, ½ cup / 1.6 mg  · Raisins, ½ cup / 1.5 mg  · Soy milk, 1 cup  / 1.5 mg  · Whole-wheat bread, 1 slice / 1.2 mg  · Spinach, 1 cup / 0.8 mg  · Broccoli, ½ cup / 0.6 mg  IRON ABSORPTION  Certain foods can decrease the body's absorption of iron. Try to avoid these foods and beverages while eating meals with iron-containing foods:  · Coffee.  · Tea.  · Fiber.  · Soy.  Foods containing vitamin C can help increase the amount of iron your body absorbs from iron sources, especially from nonheme sources. Eat foods with vitamin C along with iron-containing foods to increase your iron absorption. Foods that are high in vitamin C include many fruits and vegetables. Some good sources are:  · Fresh orange juice.  · Oranges.  · Strawberries.  · Mangoes.  · Grapefruit.  · Red bell peppers.  · Green bell peppers.  · Broccoli.  · Potatoes with skin.  · Tomato juice.  Document 

## 2012-10-21 NOTE — Progress Notes (Signed)
Hematology and Oncology Follow Up Visit  Christina Bennett [id number] [birth year] 72 y.o. 10/21/2012 1:03 PM   Principle Diagnosis: 72 year old with iron deficiency anemia diagnosed in 04/2012. Her GI work up is negative.   Current therapy: Oral iron replacement once a day.   Interim History: [name] presents today for a follow up visit. She is a pleasant women with the above history. Since her last visit, she has been doing well. She is taking oral iron once a day with out complications. Overall she has really not had any symptomatology. Had not had any constitutional symptoms. No major changes in her performance status. No bleeding noted at this time. Denies constipation.    Medications: I have reviewed the patient's current medications. Current outpatient prescriptions:acyclovir (ZOVIRAX) 400 MG tablet, Take 400 mg by mouth daily., Disp: , Rfl: ;  albuterol (PROVENTIL HFA;VENTOLIN HFA) 108 (90 BASE) MCG/ACT inhaler, Inhale 2 puffs into the lungs every 4 (four) hours as needed., Disp: , Rfl: ;  calcitonin, salmon, (MIACALCIN/FORTICAL) 200 UNIT/ACT nasal spray, Place 1 spray into the nose daily., Disp: , Rfl:  carboxymethylcellulose (REFRESH PLUS) 0.5 % SOLN, 1 drop at bedtime., Disp: , Rfl: ;  ferrous sulfate 325 (65 FE) MG tablet, Take 325 mg by mouth 2 (two) times daily. , Disp: , Rfl: ;  fish oil-omega-3 fatty acids 1000 MG capsule, Take 3 g by mouth daily., Disp: , Rfl: ;  fluticasone (FLOVENT DISKUS) 50 MCG/BLIST diskus inhaler, Inhale 2 puffs into the lungs daily., Disp: , Rfl:  hydrochlorothiazide (HYDRODIURIL) 25 MG tablet, Take 12.5 mg by mouth daily., Disp: , Rfl: ;  metFORMIN (GLUCOPHAGE) 500 MG tablet, Take 500 mg by mouth 2 (two) times daily with a meal., Disp: , Rfl: ;  omeprazole (PRILOSEC) 10 MG capsule, Take 10 mg by mouth daily. Unsure of dose, Disp: , Rfl: ;  simvastatin (ZOCOR) 40 MG tablet, Take 40 mg by mouth every evening., Disp: , Rfl:  Vitamin D, Ergocalciferol, (DRISDOL)  50000 UNITS CAPS, Take 50,000 Units by mouth every 14 (fourteen) days., Disp: , Rfl:   Allergies:  Allergies  Allergen Reactions  . Ibuprofen     Past Medical History, Surgical history, Social history, and Family History were reviewed and updated.  Review of Systems: Constitutional:  Negative for fever, chills, night sweats, anorexia, weight loss, pain. Cardiovascular: no chest pain or dyspnea on exertion Respiratory: negative Neurological: negative Dermatological: negative ENT: negative Skin: Negative. Gastrointestinal: negative Genito-Urinary: negative Hematological and Lymphatic: negative Breast: negative Musculoskeletal: negative Remaining ROS negative. Physical Exam: Blood pressure 161/72, pulse 85, temperature 98.4 F (36.9 C), temperature source Oral, resp. rate 18, height 5\' 2"  (1.575 m), weight 183 lb 3.2 oz (83.099 kg). ECOG: 0 General appearance: alert Head: Normocephalic, without obvious abnormality, atraumatic Neck: no adenopathy, no carotid bruit, no JVD, supple, symmetrical, trachea midline and thyroid not enlarged, symmetric, no tenderness/mass/nodules Lymph nodes: Cervical, supraclavicular, and axillary nodes normal. Heart:regular rate and rhythm, S1, S2 normal, no murmur, click, rub or gallop Lung:chest clear, no wheezing, rales, normal symmetric air entry Abdomin: soft, non-tender, without masses or organomegaly EXT:no erythema, induration, or nodules   Lab Results: Lab Results  Component Value Date   WBC 10.5* 10/21/2012   HGB 11.3* 10/21/2012   HCT 35.7 10/21/2012   MCV 76.5* 10/21/2012   PLT 243 10/21/2012     Chemistry      Component Value Date/Time   NA 140 04/23/2012 1017   K 3.7 04/23/2012 1017   CL  104 04/23/2012 1017   CO2 25 04/23/2012 1017   BUN 11.0 04/23/2012 1017   CREATININE 0.7 04/23/2012 1017      Component Value Date/Time   CALCIUM 9.2 04/23/2012 1017   ALKPHOS 87 04/23/2012 1017   AST 11 04/23/2012 1017   ALT <6 Repeated and  Verified 04/23/2012 1017   BILITOT 0.40 04/23/2012 1017      Impression and Plan:  This is a pleasant 72 year old woman with the  following issues:  1. Microcytic, hypochromic anemia likely due to iron deficiency anemia. She is on oral iron once a day with slightly low Hgb. Hgb had normalized on ferrous sulfate BID. I have advised her to resume her iron BID. I will repeat her counts in 3 months. If her hgb remains normal she will follow up as needed. If her counts drop, we will consider IV iron.  2. Leukocytosis. This is likely reactive and is resolving.      Clenton Pare 3/12/20141:03 PM

## 2012-11-02 ENCOUNTER — Ambulatory Visit
Admission: RE | Admit: 2012-11-02 | Discharge: 2012-11-02 | Disposition: A | Discharge status: Home / Self Care | Payer: Medicare Other | Source: Ambulatory Visit | Attending: Internal Medicine | Admitting: Internal Medicine

## 2012-11-02 DIAGNOSIS — Z1231 Encounter for screening mammogram for malignant neoplasm of breast: Secondary | ICD-10-CM

## 2013-01-21 ENCOUNTER — Ambulatory Visit (HOSPITAL_BASED_OUTPATIENT_CLINIC_OR_DEPARTMENT_OTHER): Payer: Medicare Other | Admitting: Oncology

## 2013-01-21 ENCOUNTER — Other Ambulatory Visit (HOSPITAL_BASED_OUTPATIENT_CLINIC_OR_DEPARTMENT_OTHER): Payer: Medicare Other | Admitting: Lab

## 2013-01-21 ENCOUNTER — Telehealth: Payer: Self-pay | Admitting: Oncology

## 2013-01-21 VITALS — BP 161/87 | HR 80 | Temp 97.8°F | Resp 18 | Ht 62.0 in | Wt 187.6 lb

## 2013-01-21 DIAGNOSIS — D72829 Elevated white blood cell count, unspecified: Secondary | ICD-10-CM | POA: Diagnosis not present

## 2013-01-21 DIAGNOSIS — D509 Iron deficiency anemia, unspecified: Secondary | ICD-10-CM | POA: Diagnosis not present

## 2013-01-21 LAB — CBC WITH DIFFERENTIAL/PLATELET
BASO%: 0.9 % (ref 0.0–2.0)
Basophils Absolute: 0.1 10*3/uL (ref 0.0–0.1)
EOS%: 7.4 % — ABNORMAL HIGH (ref 0.0–7.0)
Eosinophils Absolute: 0.9 10*3/uL — ABNORMAL HIGH (ref 0.0–0.5)
HCT: 37.6 % (ref 34.8–46.6)
HGB: 12.2 g/dL (ref 11.6–15.9)
LYMPH%: 22.8 % (ref 14.0–49.7)
MCH: 25.5 pg (ref 25.1–34.0)
MCHC: 32.4 g/dL (ref 31.5–36.0)
MCV: 78.5 fL — ABNORMAL LOW (ref 79.5–101.0)
MONO#: 0.9 10*3/uL (ref 0.1–0.9)
MONO%: 7.4 % (ref 0.0–14.0)
NEUT#: 7.6 10*3/uL — ABNORMAL HIGH (ref 1.5–6.5)
NEUT%: 61.5 % (ref 38.4–76.8)
Platelets: 228 10*3/uL (ref 145–400)
RBC: 4.78 10*6/uL (ref 3.70–5.45)
RDW: 16.7 % — ABNORMAL HIGH (ref 11.2–14.5)
WBC: 12.4 10*3/uL — ABNORMAL HIGH (ref 3.9–10.3)
lymph#: 2.8 10*3/uL (ref 0.9–3.3)

## 2013-01-21 LAB — IRON AND TIBC
%SAT: 13 % — ABNORMAL LOW (ref 20–55)
Iron: 52 ug/dL (ref 42–145)
TIBC: 399 ug/dL (ref 250–470)
UIBC: 347 ug/dL (ref 125–400)

## 2013-01-21 LAB — FERRITIN: Ferritin: 13 ng/mL (ref 10–291)

## 2013-01-21 NOTE — Telephone Encounter (Signed)
gv and printed appt sched and avs for pt  °

## 2013-01-21 NOTE — Progress Notes (Signed)
Hematology and Oncology Follow Up Visit  Christina Bennett [id number] 03/22/1941 72 y.o. 01/21/2013 10:29 AM   Principle Diagnosis: 72 year old with iron deficiency anemia diagnosed in 04/2012. Her GI work up is negative.   Current therapy: Oral iron replacement twice a day.  Interim History: Christina Bennett presents today for a follow up visit. She is a pleasant women with the above history. Since her last visit, she has been doing well. She is taking oral iron twice a day with out complications. Overall she has really not had any symptomatology. Had not had any constitutional symptoms. No major changes in her performance status. No bleeding noted at this time. Denies constipation. Her energy is improved at this time.    Medications: I have reviewed the patient's current medications.  Current Outpatient Prescriptions  Medication Sig Dispense Refill  . acyclovir (ZOVIRAX) 400 MG tablet Take 400 mg by mouth daily.      Marland Kitchen albuterol (PROVENTIL HFA;VENTOLIN HFA) 108 (90 BASE) MCG/ACT inhaler Inhale 2 puffs into the lungs every 4 (four) hours as needed.      . calcitonin, salmon, (MIACALCIN/FORTICAL) 200 UNIT/ACT nasal spray Place 1 spray into the nose daily.      . carboxymethylcellulose (REFRESH PLUS) 0.5 % SOLN 1 drop at bedtime.      . ferrous sulfate 325 (65 FE) MG tablet Take 325 mg by mouth 2 (two) times daily.       . fish oil-omega-3 fatty acids 1000 MG capsule Take 3 g by mouth daily.      . fluticasone (FLOVENT DISKUS) 50 MCG/BLIST diskus inhaler Inhale 2 puffs into the lungs daily.      . hydrochlorothiazide (HYDRODIURIL) 25 MG tablet Take 12.5 mg by mouth daily.      . metFORMIN (GLUCOPHAGE) 500 MG tablet Take 500 mg by mouth 2 (two) times daily with a meal.      . omeprazole (PRILOSEC) 10 MG capsule Take 10 mg by mouth daily. Unsure of dose      . simvastatin (ZOCOR) 40 MG tablet Take 40 mg by mouth every evening.      . Vitamin D, Ergocalciferol, (DRISDOL) 50000 UNITS CAPS Take 50,000  Units by mouth every 14 (fourteen) days.       No current facility-administered medications for this visit.    Allergies:  Allergies  Allergen Reactions  . Ibuprofen     Past Medical History, Surgical history, Social history, and Family History were reviewed and updated.  Review of Systems: Constitutional:  Negative for fever, chills, night sweats, anorexia, weight loss, pain. Cardiovascular: no chest pain or dyspnea on exertion Respiratory: negative Neurological: negative Dermatological: negative ENT: negative Skin: Negative. Gastrointestinal: negative Genito-Urinary: negative Hematological and Lymphatic: negative Breast: negative Musculoskeletal: negative Remaining ROS negative. Physical Exam: Blood pressure 161/87, pulse 80, temperature 97.8 F (36.6 C), temperature source Oral, resp. rate 18, height 5\' 2"  (1.575 m), weight 187 lb 9.6 oz (85.095 kg). ECOG: 0 General appearance: alert Head: Normocephalic, without obvious abnormality, atraumatic Neck: no adenopathy, no carotid bruit, no JVD, supple, symmetrical, trachea midline and thyroid not enlarged, symmetric, no tenderness/mass/nodules Lymph nodes: Cervical, supraclavicular, and axillary nodes normal. Heart:regular rate and rhythm, S1, S2 normal, no murmur, click, rub or gallop Lung:chest clear, no wheezing, rales, normal symmetric air entry Abdomin: soft, non-tender, without masses or organomegaly EXT:no erythema, induration, or nodules   Lab Results: Lab Results  Component Value Date   WBC 12.4* 01/21/2013   HGB 12.2 01/21/2013   HCT  37.6 01/21/2013   MCV 78.5* 01/21/2013   PLT 228 01/21/2013     Chemistry      Component Value Date/Time   NA 140 04/23/2012 1017   K 3.7 04/23/2012 1017   CL 104 04/23/2012 1017   CO2 25 04/23/2012 1017   BUN 11.0 04/23/2012 1017   CREATININE 0.7 04/23/2012 1017      Component Value Date/Time   CALCIUM 9.2 04/23/2012 1017   ALKPHOS 87 04/23/2012 1017   AST 11 04/23/2012 1017    ALT <6 Repeated and Verified 04/23/2012 1017   BILITOT 0.40 04/23/2012 1017      Impression and Plan:  This is a pleasant 72 year old woman with the  following issues:  1. Microcytic, hypochromic anemia likely due to iron deficiency anemia which has improved with  oral iron twice a day. Hgb had normalized on ferrous sulfate BID. I have advised her to resume her iron daily. I will repeat her counts in 6 months. If her hgb remains normal she will follow up as needed. If her counts drop, we will consider Increase it again to BID  2. Leukocytosis. This is likely reactive and is resolving.      Khylah Kendra 6/12/201410:29 AM

## 2013-01-27 DIAGNOSIS — H02059 Trichiasis without entropian unspecified eye, unspecified eyelid: Secondary | ICD-10-CM | POA: Diagnosis not present

## 2013-01-27 DIAGNOSIS — H2510 Age-related nuclear cataract, unspecified eye: Secondary | ICD-10-CM | POA: Diagnosis not present

## 2013-01-27 DIAGNOSIS — H179 Unspecified corneal scar and opacity: Secondary | ICD-10-CM | POA: Diagnosis not present

## 2013-01-27 DIAGNOSIS — H04129 Dry eye syndrome of unspecified lacrimal gland: Secondary | ICD-10-CM | POA: Diagnosis not present

## 2013-03-22 DIAGNOSIS — I10 Essential (primary) hypertension: Secondary | ICD-10-CM | POA: Diagnosis not present

## 2013-03-22 DIAGNOSIS — J449 Chronic obstructive pulmonary disease, unspecified: Secondary | ICD-10-CM | POA: Diagnosis not present

## 2013-03-22 DIAGNOSIS — K219 Gastro-esophageal reflux disease without esophagitis: Secondary | ICD-10-CM | POA: Diagnosis not present

## 2013-03-22 DIAGNOSIS — E669 Obesity, unspecified: Secondary | ICD-10-CM | POA: Diagnosis not present

## 2013-03-22 DIAGNOSIS — Z1331 Encounter for screening for depression: Secondary | ICD-10-CM | POA: Diagnosis not present

## 2013-03-22 DIAGNOSIS — E119 Type 2 diabetes mellitus without complications: Secondary | ICD-10-CM | POA: Diagnosis not present

## 2013-03-22 DIAGNOSIS — Z0000 Encounter for general adult medical examination without abnormal findings: Secondary | ICD-10-CM | POA: Diagnosis not present

## 2013-03-22 DIAGNOSIS — E782 Mixed hyperlipidemia: Secondary | ICD-10-CM | POA: Diagnosis not present

## 2013-04-13 DIAGNOSIS — I10 Essential (primary) hypertension: Secondary | ICD-10-CM | POA: Diagnosis not present

## 2013-05-10 DIAGNOSIS — I10 Essential (primary) hypertension: Secondary | ICD-10-CM | POA: Diagnosis not present

## 2013-05-10 DIAGNOSIS — K219 Gastro-esophageal reflux disease without esophagitis: Secondary | ICD-10-CM | POA: Diagnosis not present

## 2013-05-10 DIAGNOSIS — E119 Type 2 diabetes mellitus without complications: Secondary | ICD-10-CM | POA: Diagnosis not present

## 2013-05-10 DIAGNOSIS — J449 Chronic obstructive pulmonary disease, unspecified: Secondary | ICD-10-CM | POA: Diagnosis not present

## 2013-05-10 DIAGNOSIS — R079 Chest pain, unspecified: Secondary | ICD-10-CM | POA: Diagnosis not present

## 2013-05-27 DIAGNOSIS — Z23 Encounter for immunization: Secondary | ICD-10-CM | POA: Diagnosis not present

## 2013-07-15 ENCOUNTER — Telehealth: Payer: Self-pay | Admitting: Oncology

## 2013-07-15 NOTE — Telephone Encounter (Signed)
moved 12/10 appt to 12/9 due to [facility] not working 12/10. s/w pt she is aware.

## 2013-07-20 ENCOUNTER — Ambulatory Visit (HOSPITAL_BASED_OUTPATIENT_CLINIC_OR_DEPARTMENT_OTHER): Payer: Medicare Other | Admitting: Oncology

## 2013-07-20 ENCOUNTER — Encounter: Payer: Self-pay | Admitting: Oncology

## 2013-07-20 ENCOUNTER — Other Ambulatory Visit (HOSPITAL_BASED_OUTPATIENT_CLINIC_OR_DEPARTMENT_OTHER): Payer: Medicare Other | Admitting: Lab

## 2013-07-20 ENCOUNTER — Telehealth: Payer: Self-pay | Admitting: Oncology

## 2013-07-20 VITALS — BP 135/87 | HR 83 | Temp 98.2°F | Resp 18 | Ht 62.0 in | Wt 192.2 lb

## 2013-07-20 DIAGNOSIS — D72829 Elevated white blood cell count, unspecified: Secondary | ICD-10-CM | POA: Diagnosis not present

## 2013-07-20 DIAGNOSIS — D509 Iron deficiency anemia, unspecified: Secondary | ICD-10-CM | POA: Diagnosis not present

## 2013-07-20 LAB — COMPREHENSIVE METABOLIC PANEL (CC13)
ALT: 6 U/L (ref 0–55)
AST: 13 U/L (ref 5–34)
Albumin: 3.5 g/dL (ref 3.5–5.0)
Alkaline Phosphatase: 83 U/L (ref 40–150)
Anion Gap: 11 mEq/L (ref 3–11)
BUN: 14.7 mg/dL (ref 7.0–26.0)
CO2: 26 mEq/L (ref 22–29)
Calcium: 9.4 mg/dL (ref 8.4–10.4)
Chloride: 106 mEq/L (ref 98–109)
Creatinine: 0.7 mg/dL (ref 0.6–1.1)
Glucose: 99 mg/dl (ref 70–140)
Potassium: 3.7 mEq/L (ref 3.5–5.1)
Sodium: 142 mEq/L (ref 136–145)
Total Bilirubin: 0.35 mg/dL (ref 0.20–1.20)
Total Protein: 7 g/dL (ref 6.4–8.3)

## 2013-07-20 LAB — CBC WITH DIFFERENTIAL/PLATELET
BASO%: 0.4 % (ref 0.0–2.0)
Basophils Absolute: 0 10*3/uL (ref 0.0–0.1)
EOS%: 6.5 % (ref 0.0–7.0)
Eosinophils Absolute: 0.7 10*3/uL — ABNORMAL HIGH (ref 0.0–0.5)
HCT: 35.9 % (ref 34.8–46.6)
HGB: 11.3 g/dL — ABNORMAL LOW (ref 11.6–15.9)
LYMPH%: 21.8 % (ref 14.0–49.7)
MCH: 25.6 pg (ref 25.1–34.0)
MCHC: 31.6 g/dL (ref 31.5–36.0)
MCV: 81 fL (ref 79.5–101.0)
MONO#: 0.7 10*3/uL (ref 0.1–0.9)
MONO%: 6.5 % (ref 0.0–14.0)
NEUT#: 6.8 10*3/uL — ABNORMAL HIGH (ref 1.5–6.5)
NEUT%: 64.8 % (ref 38.4–76.8)
Platelets: 235 10*3/uL (ref 145–400)
RBC: 4.43 10*6/uL (ref 3.70–5.45)
RDW: 16.2 % — ABNORMAL HIGH (ref 11.2–14.5)
WBC: 10.5 10*3/uL — ABNORMAL HIGH (ref 3.9–10.3)
lymph#: 2.3 10*3/uL (ref 0.9–3.3)

## 2013-07-20 LAB — FERRITIN CHCC: Ferritin: 12 ng/ml (ref 9–269)

## 2013-07-20 LAB — IRON AND TIBC CHCC
%SAT: 11 % — ABNORMAL LOW (ref 21–57)
Iron: 42 ug/dL (ref 41–142)
TIBC: 373 ug/dL (ref 236–444)
UIBC: 330 ug/dL (ref 120–384)

## 2013-07-20 NOTE — Progress Notes (Signed)
Hematology and Oncology Follow Up Visit  Christina Bennett [id number] 03-Apr-1941 72 y.o. 07/20/2013 12:52 PM   Principle Diagnosis: 72 year old with iron deficiency anemia diagnosed in 04/2012. Her GI work up is negative.   Current therapy: Oral iron replacement once a day.  Interim History: Christina Bennett presents today for a follow up visit. She is a pleasant women with the above history. Since her last visit, she has been doing well. She is taking oral iron once a day with out complications. Overall she has really not had any symptomatology. Had not had any constitutional symptoms. No major changes in her performance status. No bleeding noted at this time. Denies constipation. Her energy is improved at this time.    Medications: I have reviewed the patient's current medications.  Current Outpatient Prescriptions  Medication Sig Dispense Refill  . acyclovir (ZOVIRAX) 400 MG tablet Take 400 mg by mouth daily.      Marland Kitchen albuterol (PROVENTIL HFA;VENTOLIN HFA) 108 (90 BASE) MCG/ACT inhaler Inhale 2 puffs into the lungs every 4 (four) hours as needed.      . calcitonin, salmon, (MIACALCIN/FORTICAL) 200 UNIT/ACT nasal spray Place 1 spray into the nose daily.      . carboxymethylcellulose (REFRESH PLUS) 0.5 % SOLN 1 drop at bedtime.      . ferrous sulfate 325 (65 FE) MG tablet Take 325 mg by mouth 2 (two) times daily.       . fish oil-omega-3 fatty acids 1000 MG capsule Take 3 g by mouth daily.      . fluticasone (FLOVENT DISKUS) 50 MCG/BLIST diskus inhaler Inhale 2 puffs into the lungs daily.      . hydrochlorothiazide (HYDRODIURIL) 25 MG tablet Take 12.5 mg by mouth daily.      . metFORMIN (GLUCOPHAGE) 500 MG tablet Take 500 mg by mouth 2 (two) times daily with a meal.      . omeprazole (PRILOSEC) 10 MG capsule Take 10 mg by mouth daily. Unsure of dose      . simvastatin (ZOCOR) 40 MG tablet Take 40 mg by mouth every evening.      . Vitamin D, Ergocalciferol, (DRISDOL) 50000 UNITS CAPS Take 50,000  Units by mouth every 14 (fourteen) days.       No current facility-administered medications for this visit.    Allergies:  Allergies  Allergen Reactions  . Ibuprofen     Past Medical History, Surgical history, Social history, and Family History were reviewed and updated.  Review of Systems: Constitutional:  Negative for fever, chills, night sweats, anorexia, weight loss, pain. Cardiovascular: no chest pain or dyspnea on exertion Respiratory: negative Neurological: negative Dermatological: negative ENT: negative Skin: Negative. Gastrointestinal: negative Genito-Urinary: negative Hematological and Lymphatic: negative Breast: negative Musculoskeletal: negative Remaining ROS negative.  Physical Exam: Blood pressure 135/87, pulse 83, temperature 98.2 F (36.8 C), temperature source Oral, resp. rate 18, height 5\' 2"  (1.575 m), weight 192 lb 3.2 oz (87.181 kg). ECOG: 0 General appearance: alert Head: Normocephalic, without obvious abnormality, atraumatic Neck: no adenopathy, no carotid bruit, no JVD, supple, symmetrical, trachea midline and thyroid not enlarged, symmetric, no tenderness/mass/nodules Lymph nodes: Cervical, supraclavicular, and axillary nodes normal. Heart:regular rate and rhythm, S1, S2 normal, no murmur, click, rub or gallop Lung:chest clear, no wheezing, rales, normal symmetric air entry Abdomen: soft, non-tender, without masses or organomegaly EXT:no erythema, induration, or nodules   Lab Results: Lab Results  Component Value Date   WBC 10.5* 07/20/2013   HGB 11.3* 07/20/2013  HCT 35.9 07/20/2013   MCV 81.0 07/20/2013   PLT 235 07/20/2013     Chemistry      Component Value Date/Time   NA 142 07/20/2013 0941   K 3.7 07/20/2013 0941   CL 104 04/23/2012 1017   CO2 26 07/20/2013 0941   BUN 14.7 07/20/2013 0941   CREATININE 0.7 07/20/2013 0941      Component Value Date/Time   CALCIUM 9.4 07/20/2013 0941   ALKPHOS 83 07/20/2013 0941   AST 13 07/20/2013 0941    ALT 6 07/20/2013 0941   BILITOT 0.35 07/20/2013 0941      Impression and Plan:  This is a pleasant 72 year old woman with the following issues:   1. Microcytic, hypochromic anemia likely due to iron deficiency anemia which has improved with  oral iron daily. Hgb is stable. I have advised her to continue iron daily. I will repeat her counts in 6 months. If her hgb remains normal she will follow up as needed. If her counts drop, we will consider Increase it again to BID  2. Leukocytosis. This is likely reactive and is resolving.      Christina Bennett 12/9/201412:52 PM

## 2013-07-20 NOTE — Telephone Encounter (Signed)
per 12/9 POF appt made for RV in 6 mos Lab will be added for same date tomorrow when lab schedule maintenance is complete shh

## 2013-07-21 ENCOUNTER — Other Ambulatory Visit: Payer: Medicare Other | Admitting: Lab

## 2013-07-21 ENCOUNTER — Ambulatory Visit: Payer: Medicare Other | Admitting: Oncology

## 2013-08-18 DIAGNOSIS — H04129 Dry eye syndrome of unspecified lacrimal gland: Secondary | ICD-10-CM | POA: Diagnosis not present

## 2013-08-18 DIAGNOSIS — H1710 Central corneal opacity, unspecified eye: Secondary | ICD-10-CM | POA: Diagnosis not present

## 2013-08-18 DIAGNOSIS — E119 Type 2 diabetes mellitus without complications: Secondary | ICD-10-CM | POA: Diagnosis not present

## 2013-08-18 DIAGNOSIS — H2510 Age-related nuclear cataract, unspecified eye: Secondary | ICD-10-CM | POA: Diagnosis not present

## 2013-09-23 DIAGNOSIS — M949 Disorder of cartilage, unspecified: Secondary | ICD-10-CM | POA: Diagnosis not present

## 2013-09-23 DIAGNOSIS — E119 Type 2 diabetes mellitus without complications: Secondary | ICD-10-CM | POA: Diagnosis not present

## 2013-09-23 DIAGNOSIS — I10 Essential (primary) hypertension: Secondary | ICD-10-CM | POA: Diagnosis not present

## 2013-09-23 DIAGNOSIS — E782 Mixed hyperlipidemia: Secondary | ICD-10-CM | POA: Diagnosis not present

## 2013-09-23 DIAGNOSIS — M1990 Unspecified osteoarthritis, unspecified site: Secondary | ICD-10-CM | POA: Diagnosis not present

## 2013-09-23 DIAGNOSIS — J449 Chronic obstructive pulmonary disease, unspecified: Secondary | ICD-10-CM | POA: Diagnosis not present

## 2013-09-23 DIAGNOSIS — M899 Disorder of bone, unspecified: Secondary | ICD-10-CM | POA: Diagnosis not present

## 2013-09-23 DIAGNOSIS — K219 Gastro-esophageal reflux disease without esophagitis: Secondary | ICD-10-CM | POA: Diagnosis not present

## 2013-10-27 DIAGNOSIS — J449 Chronic obstructive pulmonary disease, unspecified: Secondary | ICD-10-CM | POA: Diagnosis not present

## 2013-11-08 ENCOUNTER — Other Ambulatory Visit: Payer: Self-pay

## 2013-11-08 DIAGNOSIS — Z1231 Encounter for screening mammogram for malignant neoplasm of breast: Secondary | ICD-10-CM

## 2013-11-29 ENCOUNTER — Ambulatory Visit
Admission: RE | Admit: 2013-11-29 | Discharge: 2013-11-29 | Disposition: A | Discharge status: Home / Self Care | Payer: Medicare Other | Source: Ambulatory Visit

## 2013-11-29 DIAGNOSIS — Z1231 Encounter for screening mammogram for malignant neoplasm of breast: Secondary | ICD-10-CM | POA: Diagnosis not present

## 2014-01-18 ENCOUNTER — Other Ambulatory Visit (HOSPITAL_BASED_OUTPATIENT_CLINIC_OR_DEPARTMENT_OTHER): Payer: Medicare Other

## 2014-01-18 ENCOUNTER — Ambulatory Visit (HOSPITAL_BASED_OUTPATIENT_CLINIC_OR_DEPARTMENT_OTHER): Payer: Medicare Other | Admitting: Oncology

## 2014-01-18 ENCOUNTER — Telehealth: Payer: Self-pay | Admitting: Oncology

## 2014-01-18 VITALS — BP 150/78 | HR 89 | Temp 97.8°F | Resp 18 | Ht 62.0 in | Wt 192.4 lb

## 2014-01-18 DIAGNOSIS — D509 Iron deficiency anemia, unspecified: Secondary | ICD-10-CM

## 2014-01-18 DIAGNOSIS — D72829 Elevated white blood cell count, unspecified: Secondary | ICD-10-CM | POA: Diagnosis not present

## 2014-01-18 LAB — CBC WITH DIFFERENTIAL/PLATELET
BASO%: 0.5 % (ref 0.0–2.0)
Basophils Absolute: 0.1 10*3/uL (ref 0.0–0.1)
EOS%: 6 % (ref 0.0–7.0)
Eosinophils Absolute: 0.8 10*3/uL — ABNORMAL HIGH (ref 0.0–0.5)
HCT: 36.2 % (ref 34.8–46.6)
HGB: 11.1 g/dL — ABNORMAL LOW (ref 11.6–15.9)
LYMPH%: 20.3 % (ref 14.0–49.7)
MCH: 24.3 pg — ABNORMAL LOW (ref 25.1–34.0)
MCHC: 30.7 g/dL — ABNORMAL LOW (ref 31.5–36.0)
MCV: 79 fL — ABNORMAL LOW (ref 79.5–101.0)
MONO#: 0.9 10*3/uL (ref 0.1–0.9)
MONO%: 7.5 % (ref 0.0–14.0)
NEUT#: 8.2 10*3/uL — ABNORMAL HIGH (ref 1.5–6.5)
NEUT%: 65.7 % (ref 38.4–76.8)
Platelets: 275 10*3/uL (ref 145–400)
RBC: 4.59 10*6/uL (ref 3.70–5.45)
RDW: 16.3 % — ABNORMAL HIGH (ref 11.2–14.5)
WBC: 12.4 10*3/uL — ABNORMAL HIGH (ref 3.9–10.3)
lymph#: 2.5 10*3/uL (ref 0.9–3.3)

## 2014-01-18 LAB — FERRITIN CHCC: Ferritin: 12 ng/ml (ref 9–269)

## 2014-01-18 LAB — IRON AND TIBC CHCC
%SAT: 18 % — ABNORMAL LOW (ref 21–57)
Iron: 67 ug/dL (ref 41–142)
TIBC: 376 ug/dL (ref 236–444)
UIBC: 310 ug/dL (ref 120–384)

## 2014-01-18 NOTE — Progress Notes (Signed)
Hematology and Oncology Follow Up Visit  Christina Bennett [id number] Jul 18, 1941 73 y.o. 01/18/2014 10:42 AM   Principle Diagnosis: 73 year old with iron deficiency anemia diagnosed in 04/2012. Her GI work up did not show a clear cut source of bleeding.  Current therapy: Oral iron replacement once a day.  Interim History: [name] presents today for a follow up visit. Since her last visit, she has been doing well. She is taking oral iron once a day with out complications. She reports that she has not missed any doses. She did not report any fatigue or tiredness. Had not had any constitutional symptoms. No major changes in her performance status. No bleeding noted at this time. Denies constipation, diarrhea or dyspepsia. He does not report any headaches or blurred vision or double vision. She does not report any syncope or seizures. She did not report any chest pain or shortness of breath. Is not reporting any frequency urgency or hesitancy. His review of systems unremarkable.    Medications: I have reviewed the patient's current medications.  Current Outpatient Prescriptions  Medication Sig Dispense Refill  . acyclovir (ZOVIRAX) 400 MG tablet Take 400 mg by mouth daily.      Marland Kitchen albuterol (PROVENTIL HFA;VENTOLIN HFA) 108 (90 BASE) MCG/ACT inhaler Inhale 2 puffs into the lungs every 4 (four) hours as needed.      . calcitonin, salmon, (MIACALCIN/FORTICAL) 200 UNIT/ACT nasal spray Place 1 spray into the nose daily.      . carboxymethylcellulose (REFRESH PLUS) 0.5 % SOLN 1 drop at bedtime.      . ferrous sulfate 325 (65 FE) MG tablet Take 325 mg by mouth 2 (two) times daily.       . fish oil-omega-3 fatty acids 1000 MG capsule Take 3 g by mouth daily.      . fluticasone (FLOVENT DISKUS) 50 MCG/BLIST diskus inhaler Inhale 2 puffs into the lungs daily.      . hydrochlorothiazide (HYDRODIURIL) 25 MG tablet Take 12.5 mg by mouth daily.      . metFORMIN (GLUCOPHAGE) 500 MG tablet Take 500 mg by mouth 2  (two) times daily with a meal.      . omeprazole (PRILOSEC) 10 MG capsule Take 10 mg by mouth daily. Unsure of dose      . simvastatin (ZOCOR) 40 MG tablet Take 40 mg by mouth every evening.      . Vitamin D, Ergocalciferol, (DRISDOL) 50000 UNITS CAPS Take 50,000 Units by mouth every 14 (fourteen) days.       No current facility-administered medications for this visit.    Allergies:  Allergies  Allergen Reactions  . Ibuprofen     Past Medical History, Surgical history, Social history, and Family History were reviewed and updated.    Physical Exam: Blood pressure 150/78, pulse 89, temperature 97.8 F (36.6 C), temperature source Oral, resp. rate 18, height 5\' 2"  (1.575 m), weight 192 lb 6.4 oz (87.272 kg), SpO2 97.00%. ECOG: 0 General appearance: alert awake not in any distress Head: Normocephalic, without obvious abnormality, atraumatic Neck: no adenopathy Lymph nodes: Cervical, supraclavicular, and axillary nodes normal. Heart:regular rate and rhythm, S1, S2 normal, no murmur, click, rub or gallop Lung:chest clear, no wheezing, rales, normal symmetric air entry Abdomen: soft, non-tender, without masses or organomegaly EXT:no erythema, induration, or nodules   Lab Results: Lab Results  Component Value Date   WBC 12.4* 01/18/2014   HGB 11.1* 01/18/2014   HCT 36.2 01/18/2014   MCV 79.0* 01/18/2014  PLT 275 01/18/2014     Chemistry      Component Value Date/Time   NA 142 07/20/2013 0941   K 3.7 07/20/2013 0941   CL 104 04/23/2012 1017   CO2 26 07/20/2013 0941   BUN 14.7 07/20/2013 0941   CREATININE 0.7 07/20/2013 0941      Component Value Date/Time   CALCIUM 9.4 07/20/2013 0941   ALKPHOS 83 07/20/2013 0941   AST 13 07/20/2013 0941   ALT 6 07/20/2013 0941   BILITOT 0.35 07/20/2013 0941      Impression and Plan:  This is a pleasant 73 year old woman with the following issues:   1. Microcytic, hypochromic anemia due to iron deficiency anemia which has improved with  oral iron  daily. Her hemoglobin today continue to be stable around 11. I am repeating her iron levels today as well. If she continues to be stable, we will continue on the current regimen. At her iron stores decline we will consider IV iron replacement which I have discussed with her today.  2. Leukocytosis. This appears to be a reactive in nature and I will continue to monitor it with her hemoglobin. Her differential is perfectly normal today.  3. Followup: Will be in 6 months to repeat laboratory testing.     Wyatt Portela 6/9/201510:42 AM

## 2014-01-18 NOTE — Telephone Encounter (Signed)
gv and printed appts sched and avs for ptf or DEC

## 2014-01-28 ENCOUNTER — Other Ambulatory Visit: Payer: Medicare Other

## 2014-02-16 DIAGNOSIS — H04129 Dry eye syndrome of unspecified lacrimal gland: Secondary | ICD-10-CM | POA: Diagnosis not present

## 2014-02-16 DIAGNOSIS — H2510 Age-related nuclear cataract, unspecified eye: Secondary | ICD-10-CM | POA: Diagnosis not present

## 2014-02-16 DIAGNOSIS — B0052 Herpesviral keratitis: Secondary | ICD-10-CM | POA: Diagnosis not present

## 2014-03-02 DIAGNOSIS — B0052 Herpesviral keratitis: Secondary | ICD-10-CM | POA: Diagnosis not present

## 2014-03-02 DIAGNOSIS — H02059 Trichiasis without entropian unspecified eye, unspecified eyelid: Secondary | ICD-10-CM | POA: Diagnosis not present

## 2014-03-02 DIAGNOSIS — H2510 Age-related nuclear cataract, unspecified eye: Secondary | ICD-10-CM | POA: Diagnosis not present

## 2014-03-29 DIAGNOSIS — Z6839 Body mass index (BMI) 39.0-39.9, adult: Secondary | ICD-10-CM | POA: Diagnosis not present

## 2014-03-29 DIAGNOSIS — I10 Essential (primary) hypertension: Secondary | ICD-10-CM | POA: Diagnosis not present

## 2014-03-29 DIAGNOSIS — Z0000 Encounter for general adult medical examination without abnormal findings: Secondary | ICD-10-CM | POA: Diagnosis not present

## 2014-03-29 DIAGNOSIS — E669 Obesity, unspecified: Secondary | ICD-10-CM | POA: Diagnosis not present

## 2014-03-29 DIAGNOSIS — K219 Gastro-esophageal reflux disease without esophagitis: Secondary | ICD-10-CM | POA: Diagnosis not present

## 2014-03-29 DIAGNOSIS — J449 Chronic obstructive pulmonary disease, unspecified: Secondary | ICD-10-CM | POA: Diagnosis not present

## 2014-03-29 DIAGNOSIS — E782 Mixed hyperlipidemia: Secondary | ICD-10-CM | POA: Diagnosis not present

## 2014-03-29 DIAGNOSIS — E119 Type 2 diabetes mellitus without complications: Secondary | ICD-10-CM | POA: Diagnosis not present

## 2014-03-29 DIAGNOSIS — Z23 Encounter for immunization: Secondary | ICD-10-CM | POA: Diagnosis not present

## 2014-03-29 DIAGNOSIS — Z1331 Encounter for screening for depression: Secondary | ICD-10-CM | POA: Diagnosis not present

## 2014-04-04 DIAGNOSIS — B0052 Herpesviral keratitis: Secondary | ICD-10-CM | POA: Diagnosis not present

## 2014-04-04 DIAGNOSIS — H01029 Squamous blepharitis unspecified eye, unspecified eyelid: Secondary | ICD-10-CM | POA: Diagnosis not present

## 2014-04-04 DIAGNOSIS — H2510 Age-related nuclear cataract, unspecified eye: Secondary | ICD-10-CM | POA: Diagnosis not present

## 2014-04-04 DIAGNOSIS — E119 Type 2 diabetes mellitus without complications: Secondary | ICD-10-CM | POA: Diagnosis not present

## 2014-06-13 DIAGNOSIS — Z23 Encounter for immunization: Secondary | ICD-10-CM | POA: Diagnosis not present

## 2014-07-19 ENCOUNTER — Other Ambulatory Visit (HOSPITAL_BASED_OUTPATIENT_CLINIC_OR_DEPARTMENT_OTHER): Payer: Medicare Other

## 2014-07-19 ENCOUNTER — Ambulatory Visit (HOSPITAL_BASED_OUTPATIENT_CLINIC_OR_DEPARTMENT_OTHER): Payer: Medicare Other | Admitting: Oncology

## 2014-07-19 ENCOUNTER — Telehealth: Payer: Self-pay | Admitting: *Deleted

## 2014-07-19 ENCOUNTER — Telehealth: Payer: Self-pay | Admitting: Oncology

## 2014-07-19 VITALS — BP 127/63 | HR 87 | Temp 98.3°F | Resp 18 | Ht 62.0 in | Wt 195.5 lb

## 2014-07-19 DIAGNOSIS — D509 Iron deficiency anemia, unspecified: Secondary | ICD-10-CM

## 2014-07-19 DIAGNOSIS — D500 Iron deficiency anemia secondary to blood loss (chronic): Secondary | ICD-10-CM

## 2014-07-19 DIAGNOSIS — D72829 Elevated white blood cell count, unspecified: Secondary | ICD-10-CM

## 2014-07-19 DIAGNOSIS — D649 Anemia, unspecified: Secondary | ICD-10-CM | POA: Insufficient documentation

## 2014-07-19 LAB — IRON AND TIBC CHCC
%SAT: 7 % — ABNORMAL LOW (ref 21–57)
Iron: 29 ug/dL — ABNORMAL LOW (ref 41–142)
TIBC: 390 ug/dL (ref 236–444)
UIBC: 361 ug/dL (ref 120–384)

## 2014-07-19 LAB — FERRITIN CHCC: Ferritin: 13 ng/ml (ref 9–269)

## 2014-07-19 LAB — CBC WITH DIFFERENTIAL/PLATELET
BASO%: 0.3 % (ref 0.0–2.0)
Basophils Absolute: 0 10*3/uL (ref 0.0–0.1)
EOS%: 6.4 % (ref 0.0–7.0)
Eosinophils Absolute: 0.8 10*3/uL — ABNORMAL HIGH (ref 0.0–0.5)
HCT: 36.5 % (ref 34.8–46.6)
HGB: 10.8 g/dL — ABNORMAL LOW (ref 11.6–15.9)
LYMPH%: 20.7 % (ref 14.0–49.7)
MCH: 24.7 pg — ABNORMAL LOW (ref 25.1–34.0)
MCHC: 29.6 g/dL — ABNORMAL LOW (ref 31.5–36.0)
MCV: 83.5 fL (ref 79.5–101.0)
MONO#: 0.8 10*3/uL (ref 0.1–0.9)
MONO%: 7.1 % (ref 0.0–14.0)
NEUT#: 7.7 10*3/uL — ABNORMAL HIGH (ref 1.5–6.5)
NEUT%: 65.5 % (ref 38.4–76.8)
Platelets: 261 10*3/uL (ref 145–400)
RBC: 4.37 10*6/uL (ref 3.70–5.45)
RDW: 16.1 % — ABNORMAL HIGH (ref 11.2–14.5)
WBC: 11.8 10*3/uL — ABNORMAL HIGH (ref 3.9–10.3)
lymph#: 2.4 10*3/uL (ref 0.9–3.3)

## 2014-07-19 LAB — COMPREHENSIVE METABOLIC PANEL (CC13)
ALT: <6 U/L (ref 0–55)
AST: 11 U/L (ref 5–34)
Albumin: 3.3 g/dL — ABNORMAL LOW (ref 3.5–5.0)
Alkaline Phosphatase: 81 U/L (ref 40–150)
Anion Gap: 11 mEq/L (ref 3–11)
BUN: 14.8 mg/dL (ref 7.0–26.0)
CO2: 27 mEq/L (ref 22–29)
Calcium: 9.7 mg/dL (ref 8.4–10.4)
Chloride: 104 mEq/L (ref 98–109)
Creatinine: 0.8 mg/dL (ref 0.6–1.1)
EGFR: 76 mL/min/{1.73_m2} — ABNORMAL LOW (ref >90)
Glucose: 97 mg/dl (ref 70–140)
Potassium: 4.2 mEq/L (ref 3.5–5.1)
Sodium: 142 mEq/L (ref 136–145)
Total Bilirubin: 0.34 mg/dL (ref 0.20–1.20)
Total Protein: 6.7 g/dL (ref 6.4–8.3)

## 2014-07-19 NOTE — Progress Notes (Signed)
Hematology and Oncology Follow Up Visit  Christina Bennett [id number] July 03, 1941 73 y.o. 07/19/2014 10:28 AM   Principle Diagnosis: 73 year old with iron deficiency anemia diagnosed in 04/2012. Her GI work up did not show a clear cut source of bleeding.  Current therapy: Oral iron replacement once a day.  Interim History: [name] presents today for a follow up visit. Since her last visit, she has developed a upper respiratory infection the last 3 weeks and she is recovering from it. She is taking oral iron once a day with few complications. She reports that she has missed a few doses related to diarrhea. She has not reported any constipation or dyspepsia but reports that her diarrhea interfere with her ability to take the iron regular basis. She did not report any fatigue or tiredness. Had not had any constitutional symptoms. No major changes in her performance status. No bleeding noted at this time. He does not report any headaches or blurred vision or double vision. She does not report any syncope or seizures. She did not report any chest pain or shortness of breath. Is not reporting any frequency urgency or hesitancy. His review of systems unremarkable.    Medications: I have reviewed the patient's current medications.  Current Outpatient Prescriptions  Medication Sig Dispense Refill  . acyclovir (ZOVIRAX) 400 MG tablet Take 400 mg by mouth daily.    Marland Kitchen albuterol (PROVENTIL HFA;VENTOLIN HFA) 108 (90 BASE) MCG/ACT inhaler Inhale 2 puffs into the lungs every 4 (four) hours as needed.    . calcitonin, salmon, (MIACALCIN/FORTICAL) 200 UNIT/ACT nasal spray Place 1 spray into the nose daily.    . carboxymethylcellulose (REFRESH PLUS) 0.5 % SOLN 1 drop at bedtime.    . ferrous sulfate 325 (65 FE) MG tablet Take 325 mg by mouth 2 (two) times daily.     . fish oil-omega-3 fatty acids 1000 MG capsule Take 3 g by mouth daily.    . fluticasone (FLOVENT DISKUS) 50 MCG/BLIST diskus inhaler Inhale 2 puffs  into the lungs daily.    . hydrochlorothiazide (HYDRODIURIL) 25 MG tablet Take 12.5 mg by mouth daily.    Marland Kitchen HYDROcodone-acetaminophen (NORCO/VICODIN) 5-325 MG per tablet   0  . losartan-hydrochlorothiazide (HYZAAR) 50-12.5 MG per tablet   2  . metFORMIN (GLUCOPHAGE) 500 MG tablet Take 500 mg by mouth 2 (two) times daily with a meal.    . omeprazole (PRILOSEC) 10 MG capsule Take 10 mg by mouth daily. Unsure of dose    . prednisoLONE acetate (PRED FORTE) 1 % ophthalmic suspension   5  . simvastatin (ZOCOR) 40 MG tablet Take 40 mg by mouth every evening.    . SYMBICORT 160-4.5 MCG/ACT inhaler   4  . Vitamin D, Ergocalciferol, (DRISDOL) 50000 UNITS CAPS Take 50,000 Units by mouth every 14 (fourteen) days.     No current facility-administered medications for this visit.    Allergies:  Allergies  Allergen Reactions  . Bee Venom Anaphylaxis  . Ibuprofen   . Percocet [Oxycodone-Acetaminophen]     Diabetes type 2- 1/13- metformin started  . Aspirin Rash    Past Medical History, Surgical history, Social history, and Family History were reviewed and updated.    Physical Exam: Blood pressure 127/63, pulse 87, temperature 98.3 F (36.8 C), temperature source Oral, resp. rate 18, height 5\' 2"  (1.575 m), weight 195 lb 8 oz (88.678 kg). ECOG: 0 General appearance: alert awake not in any distress Head: Normocephalic, without obvious abnormality Neck: no adenopathy Lymph  nodes: Cervical, supraclavicular, and axillary nodes normal. Heart:regular rate and rhythm, S1, S2 normal, no murmur, click, rub or gallop Lung:chest clear, no wheezing, rales, normal symmetric air entry Abdomen: soft, non-tender, without masses or organomegaly EXT:no erythema, induration, or nodules   Lab Results: Lab Results  Component Value Date   WBC 11.8* 07/19/2014   HGB 10.8* 07/19/2014   HCT 36.5 07/19/2014   MCV 83.5 07/19/2014   PLT 261 07/19/2014     Chemistry      Component Value Date/Time   NA 142  07/20/2013 0941   K 3.7 07/20/2013 0941   CL 104 04/23/2012 1017   CO2 26 07/20/2013 0941   BUN 14.7 07/20/2013 0941   CREATININE 0.7 07/20/2013 0941      Component Value Date/Time   CALCIUM 9.4 07/20/2013 0941   ALKPHOS 83 07/20/2013 0941   AST 13 07/20/2013 0941   ALT 6 07/20/2013 0941   BILITOT 0.35 07/20/2013 0941      Impression and Plan:  This is a pleasant 73 year old woman with the following issues:   1. Microcytic, hypochromic anemia due to iron deficiency anemia on oral iron daily. Her hemoglobin today has drifted down slightly and her iron stores appear to be depleted. Her oral iron tolerance have been poor and clearly she is not getting any improvement in her hemoglobin and iron studies. I discussed with her the risks and benefits of using IV iron instead. Complications from [facility] were discussed which includes arthralgias and myalgias, infusion related complications and rarely anaphylaxis. The benefit would be a quick improvement in her iron stores. She is agreeable to proceeding and would like to do that in January.  2. Leukocytosis. This appears to be a reactive in nature. Her differential is normal today.  3. Followup: Will be in 4 months to repeat laboratory testing.     [name] 12/8/201510:28 AM

## 2014-07-19 NOTE — Telephone Encounter (Signed)
Mailed out updated sch to pt with Jan schedule for IV Iron added, per staff in chemo due to insurance added 2 dates.... KJ

## 2014-07-19 NOTE — Telephone Encounter (Signed)
Pt confirmed labs/ov per 12/08 POF, gave pt AVS.... KJ, sent msg to add IV Iron in Jan, pt will need to be called concerning D/T for Iron

## 2014-07-19 NOTE — Telephone Encounter (Signed)
Per staff message and POF I have scheduled appts. Advised scheduler of appts. JMW  

## 2014-08-10 DIAGNOSIS — H2513 Age-related nuclear cataract, bilateral: Secondary | ICD-10-CM | POA: Diagnosis not present

## 2014-08-10 DIAGNOSIS — H17822 Peripheral opacity of cornea, left eye: Secondary | ICD-10-CM | POA: Diagnosis not present

## 2014-08-10 DIAGNOSIS — B0052 Herpesviral keratitis: Secondary | ICD-10-CM | POA: Diagnosis not present

## 2014-08-10 DIAGNOSIS — H18452 Nodular corneal degeneration, left eye: Secondary | ICD-10-CM | POA: Diagnosis not present

## 2014-08-16 ENCOUNTER — Ambulatory Visit (HOSPITAL_BASED_OUTPATIENT_CLINIC_OR_DEPARTMENT_OTHER): Payer: Medicare Other

## 2014-08-16 DIAGNOSIS — D509 Iron deficiency anemia, unspecified: Secondary | ICD-10-CM | POA: Diagnosis not present

## 2014-08-16 DIAGNOSIS — D500 Iron deficiency anemia secondary to blood loss (chronic): Secondary | ICD-10-CM

## 2014-08-16 MED ORDER — SODIUM CHLORIDE 0.9 % IV SOLN
Freq: Once | INTRAVENOUS | Status: AC
  Administered 2014-08-16: 12:00:00 via INTRAVENOUS

## 2014-08-16 MED ORDER — SODIUM CHLORIDE 0.9 % IV SOLN
510.0000 mg | Freq: Once | INTRAVENOUS | Status: AC
  Administered 2014-08-16: 510 mg via INTRAVENOUS
  Filled 2014-08-16: qty 17

## 2014-08-16 NOTE — Patient Instructions (Signed)

## 2014-08-23 ENCOUNTER — Ambulatory Visit (HOSPITAL_BASED_OUTPATIENT_CLINIC_OR_DEPARTMENT_OTHER): Payer: Medicare Other

## 2014-08-23 DIAGNOSIS — D509 Iron deficiency anemia, unspecified: Secondary | ICD-10-CM | POA: Diagnosis not present

## 2014-08-23 DIAGNOSIS — D500 Iron deficiency anemia secondary to blood loss (chronic): Secondary | ICD-10-CM

## 2014-08-23 MED ORDER — SODIUM CHLORIDE 0.9 % IV SOLN
510.0000 mg | Freq: Once | INTRAVENOUS | Status: AC
  Administered 2014-08-23: 510 mg via INTRAVENOUS
  Filled 2014-08-23: qty 17

## 2014-08-23 MED ORDER — SODIUM CHLORIDE 0.9 % IV SOLN
Freq: Once | INTRAVENOUS | Status: AC
  Administered 2014-08-23: 11:00:00 via INTRAVENOUS

## 2014-08-23 NOTE — Patient Instructions (Signed)

## 2014-09-14 DIAGNOSIS — H25813 Combined forms of age-related cataract, bilateral: Secondary | ICD-10-CM | POA: Diagnosis not present

## 2014-09-14 DIAGNOSIS — B0052 Herpesviral keratitis: Secondary | ICD-10-CM | POA: Diagnosis not present

## 2014-09-14 DIAGNOSIS — H17822 Peripheral opacity of cornea, left eye: Secondary | ICD-10-CM | POA: Diagnosis not present

## 2014-09-14 DIAGNOSIS — H02052 Trichiasis without entropian right lower eyelid: Secondary | ICD-10-CM | POA: Diagnosis not present

## 2014-09-14 DIAGNOSIS — E119 Type 2 diabetes mellitus without complications: Secondary | ICD-10-CM | POA: Diagnosis not present

## 2014-09-30 DIAGNOSIS — J449 Chronic obstructive pulmonary disease, unspecified: Secondary | ICD-10-CM | POA: Diagnosis not present

## 2014-09-30 DIAGNOSIS — E782 Mixed hyperlipidemia: Secondary | ICD-10-CM | POA: Diagnosis not present

## 2014-09-30 DIAGNOSIS — R079 Chest pain, unspecified: Secondary | ICD-10-CM | POA: Diagnosis not present

## 2014-09-30 DIAGNOSIS — I10 Essential (primary) hypertension: Secondary | ICD-10-CM | POA: Diagnosis not present

## 2014-09-30 DIAGNOSIS — E611 Iron deficiency: Secondary | ICD-10-CM | POA: Diagnosis not present

## 2014-09-30 DIAGNOSIS — M1990 Unspecified osteoarthritis, unspecified site: Secondary | ICD-10-CM | POA: Diagnosis not present

## 2014-09-30 DIAGNOSIS — E119 Type 2 diabetes mellitus without complications: Secondary | ICD-10-CM | POA: Diagnosis not present

## 2014-10-04 ENCOUNTER — Ambulatory Visit (INDEPENDENT_AMBULATORY_CARE_PROVIDER_SITE_OTHER): Payer: Medicare Other | Admitting: Interventional Cardiology

## 2014-10-04 ENCOUNTER — Encounter: Payer: Self-pay | Admitting: Interventional Cardiology

## 2014-10-04 VITALS — BP 158/90 | HR 95 | Ht 62.0 in | Wt 193.8 lb

## 2014-10-04 DIAGNOSIS — I25119 Atherosclerotic heart disease of native coronary artery with unspecified angina pectoris: Secondary | ICD-10-CM

## 2014-10-04 DIAGNOSIS — E785 Hyperlipidemia, unspecified: Secondary | ICD-10-CM | POA: Diagnosis not present

## 2014-10-04 DIAGNOSIS — I2510 Atherosclerotic heart disease of native coronary artery without angina pectoris: Secondary | ICD-10-CM | POA: Insufficient documentation

## 2014-10-04 DIAGNOSIS — I209 Angina pectoris, unspecified: Secondary | ICD-10-CM | POA: Diagnosis not present

## 2014-10-04 DIAGNOSIS — R079 Chest pain, unspecified: Secondary | ICD-10-CM | POA: Insufficient documentation

## 2014-10-04 DIAGNOSIS — E1169 Type 2 diabetes mellitus with other specified complication: Secondary | ICD-10-CM | POA: Insufficient documentation

## 2014-10-04 DIAGNOSIS — E118 Type 2 diabetes mellitus with unspecified complications: Secondary | ICD-10-CM

## 2014-10-04 DIAGNOSIS — E119 Type 2 diabetes mellitus without complications: Secondary | ICD-10-CM | POA: Insufficient documentation

## 2014-10-04 NOTE — Progress Notes (Signed)
Cardiology Office Note   Date:  10/04/2014   ID:  Jaidah, Lomax Jul 18, 1941, MRN [id number]  PCP:  No primary care provider on file.  Cardiologist:  Sinclair Grooms, MD   No chief complaint on file.     History of Present Illness: Christina Bennett is a 74 y.o. female who presents for chest pain and congestion. She has no priior history of CAD and prior cath unremarkable. Had 50 LAD by cath in 2005. Since I last saw her she has been diagnosed with diabetes. She has also had some dyspnea on exertion. There is no orthopnea or lower extremity edema.    Past Medical History  Diagnosis Date  . Anemia   . Hypertension   . Dyslipidemia   . GERD (gastroesophageal reflux disease)     H/H  . Osteoarthritis     Osteoarthritis of the hip, knee, and hand, vicodin , prn  . Osteopenia     BD in 2013  . Vitamin D deficiency   . Tobacco use     quit 03/2008  . History of DVT (deep vein thrombosis)     on OCP  . Herpes     herpes of the left eye, Dr Patrice Paradise optho  . Endometriosis     with ovarian radiation in 1966, gyn Dr cousins  . COPD (chronic obstructive pulmonary disease)   . Atypical chest pain     Atypical CP--stress test, cor angio, and CT chest negative in 2005. Nuclear study normal in 2011   . Mild anemia     WBC mild high, lab 2012/ HEMATOLOGY   . Diabetes     Diabetes type 2- 1/13- metformin started    No past surgical history on file.   Current Outpatient Prescriptions  Medication Sig Dispense Refill  . acyclovir (ZOVIRAX) 400 MG tablet Take 400 mg by mouth daily.    Marland Kitchen albuterol (PROVENTIL HFA;VENTOLIN HFA) 108 (90 BASE) MCG/ACT inhaler Inhale 2 puffs into the lungs every 4 (four) hours as needed.    . fish oil-omega-3 fatty acids 1000 MG capsule Take 3 g by mouth daily.    . fluticasone (FLOVENT DISKUS) 50 MCG/BLIST diskus inhaler Inhale 2 puffs into the lungs daily.    Marland Kitchen HYDROcodone-acetaminophen (NORCO/VICODIN) 5-325 MG per tablet   0  .  losartan-hydrochlorothiazide (HYZAAR) 50-12.5 MG per tablet Take 1 tablet by mouth daily.   2  . metFORMIN (GLUCOPHAGE) 500 MG tablet Take 500 mg by mouth 2 (two) times daily with a meal.    . omeprazole (PRILOSEC) 10 MG capsule Take 10 mg by mouth daily. Unsure of dose    . prednisoLONE acetate (PRED FORTE) 1 % ophthalmic suspension   5  . simvastatin (ZOCOR) 40 MG tablet Take 40 mg by mouth every evening.    . SYMBICORT 160-4.5 MCG/ACT inhaler   4  . Vitamin D, Ergocalciferol, (DRISDOL) 50000 UNITS CAPS Take 50,000 Units by mouth every 14 (fourteen) days.     No current facility-administered medications for this visit.    Allergies:   Bee venom; Ibuprofen; Percocet; and Aspirin    Social History:  The patient  reports that she has quit smoking. She does not have any smokeless tobacco history on file. She reports that she does not use illicit drugs.   Family History:  The patient's family history includes Heart disease in her brother and brother; Hypertension in her mother.    ROS:  Please see the history of  present illness.   Otherwise, review of systems are positive for none.   All other systems are reviewed and negative.    PHYSICAL EXAM: VS:  BP 158/90 mmHg  Pulse 95  Ht 5\' 2"  (1.575 m)  Wt 193 lb 12.8 oz (87.907 kg)  BMI 35.44 kg/m2 , BMI Body mass index is 35.44 kg/(m^2). GEN: Well nourished, well developed, in no acute distress HEENT: normal Neck: no JVD, carotid bruits, or masses Cardiac: RRR; no murmurs, rubs, or gallops,no edema  Respiratory:  clear to auscultation bilaterally, normal work of breathing GI: soft, nontender, nondistended, + BS MS: no deformity or atrophy Skin: warm and dry, no rash Neuro:  Strength and sensation are intact Psych: euthymic mood, full affect   EKG:  EKG is ordered today. The ekg ordered today demonstrates NSR with NSSTTWA.   Recent Labs: 07/19/2014: ALT <6; BUN 14.8; Creatinine 0.8; Hemoglobin 10.8*; Platelets 261; Potassium 4.2;  Sodium 142    Lipid Panel No results found for: CHOL, TRIG, HDL, CHOLHDL, VLDL, LDLCALC, LDLDIRECT    Wt Readings from Last 3 Encounters:  10/04/14 193 lb 12.8 oz (87.907 kg)  07/19/14 195 lb 8 oz (88.678 kg)  01/18/14 192 lb 6.4 oz (87.272 kg)      Other studies Reviewed: Additional studies/ records that were reviewed today include: Prior catheterization report. Review of the above records demonstrates: [facility] records were reviewed   ASSESSMENT AND PLAN:  1.  Coronary artery disease with nonobstructive LAD in 2005 2. Chest pain with atypical features. Patient has multiple risk factors including age, obesity, diabetes, and family history. We need to exclude myocardial ischemia as a source of the pain with a pharmacologic myocardial perfusion study. 3. Hypertension, controlled 4. Hyperlipidemia on therapy   Current medicines are reviewed at length with the patient today.  The patient does not have concerns regarding medicines.  The following changes have been made:  no change  Labs/ tests ordered today include:   Orders Placed This Encounter  Procedures  . Myocardial Perfusion Imaging  . EKG 12-Lead     Disposition:   FU with Daneen Schick in when necessary depending upon results    Signed, Sinclair Grooms, MD  10/04/2014 8:53 AM    [address], [place], [place]  [address] Phone: [phone]; Fax: [phone]

## 2014-10-04 NOTE — Patient Instructions (Signed)
Your physician recommends that you continue on your current medications as directed. Please refer to the Current Medication list given to you today.  Your physician has requested that you have a lexiscan myoview. For further information please visit [email or web address]. Please follow instruction sheet, as given.  Your physician recommends that you schedule a follow-up appointment as needed

## 2014-10-10 DIAGNOSIS — H2512 Age-related nuclear cataract, left eye: Secondary | ICD-10-CM | POA: Diagnosis not present

## 2014-10-11 DIAGNOSIS — Z961 Presence of intraocular lens: Secondary | ICD-10-CM | POA: Diagnosis not present

## 2014-10-11 DIAGNOSIS — H02052 Trichiasis without entropian right lower eyelid: Secondary | ICD-10-CM | POA: Diagnosis not present

## 2014-10-17 ENCOUNTER — Ambulatory Visit (HOSPITAL_COMMUNITY): Payer: Medicare Other | Attending: Internal Medicine | Admitting: Radiology

## 2014-10-17 ENCOUNTER — Encounter (HOSPITAL_COMMUNITY): Payer: Medicare Other

## 2014-10-17 DIAGNOSIS — I209 Angina pectoris, unspecified: Secondary | ICD-10-CM | POA: Insufficient documentation

## 2014-10-17 DIAGNOSIS — I25119 Atherosclerotic heart disease of native coronary artery with unspecified angina pectoris: Secondary | ICD-10-CM | POA: Diagnosis not present

## 2014-10-17 MED ORDER — TECHNETIUM TC 99M SESTAMIBI GENERIC - CARDIOLITE
10.0000 | Freq: Once | INTRAVENOUS | Status: AC | PRN
  Administered 2014-10-17: 10 via INTRAVENOUS

## 2014-10-17 MED ORDER — REGADENOSON 0.4 MG/5ML IV SOLN
0.4000 mg | Freq: Once | INTRAVENOUS | Status: AC
  Administered 2014-10-17: 0.4 mg via INTRAVENOUS

## 2014-10-17 MED ORDER — TECHNETIUM TC 99M SESTAMIBI GENERIC - CARDIOLITE
30.0000 | Freq: Once | INTRAVENOUS | Status: AC | PRN
  Administered 2014-10-17: 30 via INTRAVENOUS

## 2014-10-17 NOTE — Progress Notes (Signed)
[facility] [address] [place], [place] [address] [phone]    Cardiology Nuclear Med Study  Christina Bennett is a 74 y.o. female     MRN : [id number]     DOB: 1940/08/31  Procedure Date: 10/17/2014  Nuclear Med Background Indication for Stress Test:  Evaluation for Ischemia and Follow up CAD History:  CAD, MPI 2011 (normal) EF 82%, COPD Cardiac Risk Factors: Family History - CAD, History of Smoking, Hypertension, Lipids and NIDDM  Symptoms:  Chest Pain (last date of chest discomfort was last week) and DOE   Nuclear Pre-Procedure Caffeine/Decaff Intake:  None> 12 hrs NPO After: 9:30pm   Lungs:  clear O2 Sat: 94% on room air. IV 0.9% NS with Angio Cath:  22g  IV Site: R Hand x 1, tolerated well IV Started by:  Irven Baltimore, RN  Chest Size (in):  40 Cup Size: D  Height: 5\' 2"  (1.575 m)  Weight:  191 lb (86.637 kg)  BMI:  Body mass index is 34.93 kg/(m^2). Tech Comments:  Patient held Metformin this am. Irven Baltimore, RN.    Nuclear Med Study 1 or 2 day study: 1 day  Stress Test Type:  Carlton Adam  Reading MD: N/A  Order Authorizing Provider:  Daneen Schick, III, MD  Resting Radionuclide: Technetium 40m Sestamibi  Resting Radionuclide Dose: 11.0 mCi   Stress Radionuclide:  Technetium 62m Sestamibi  Stress Radionuclide Dose: 33.0 mCi           Stress Protocol Rest HR: 95 Stress HR: 104  Rest BP: 140/74 Stress BP: 148/75  Exercise Time (min): n/a METS: n/a   Predicted Max HR: 149 bpm % Max HR: 69.8 bpm Rate Pressure Product: 16328   Dose of Adenosine (mg):  n/a Dose of Lexiscan: 0.4 mg  Dose of Atropine (mg): n/a Dose of Dobutamine: n/a mcg/kg/min (at max HR)  Stress Test Technologist: Glade Lloyd, BS-ES  Nuclear Technologist:  Earl Many, CNMT     Rest Procedure:  Myocardial perfusion imaging was performed at rest 45 minutes following the intravenous administration of Technetium 37m Sestamibi. Rest ECG: NSR with non-specific ST-T  wave changes  Stress Procedure:  The patient received IV Lexiscan 0.4 mg over 15-seconds.  Technetium 93m Sestamibi injected at 30-seconds.  Quantitative spect images were obtained after a 45 minute delay.  During the infusion of Lexiscan the patient complained of fatigue and arms tingling.  These symptoms began to resolve in recovery.  Stress ECG: No significant change from baseline ECG  QPS Raw Data Images:  Mild diaphragmatic attenuation.  Normal left ventricular size. Stress Images:  Normal homogeneous uptake in all areas of the myocardium. Rest Images:  Normal homogeneous uptake in all areas of the myocardium. Subtraction (SDS):  No evidence of ischemia. Transient Ischemic Dilatation (Normal <1.22):  0.93 Lung/Heart Ratio (Normal <0.45):  0.31  Quantitative Gated Spect Images QGS EDV:  66 ml QGS ESV:  24 ml  Impression Exercise Capacity:  Lexiscan with no exercise. BP Response:  Normal blood pressure response. Clinical Symptoms:  No significant symptoms noted. ECG Impression:  No significant ST segment change suggestive of ischemia. Comparison with Prior Nuclear Study: No images to compare  Overall Impression:  Normal stress nuclear study.  LV Ejection Fraction: 63%.  LV Wall Motion:  NL LV Function; NL Wall Motion   [name] 10/17/2014

## 2014-10-24 ENCOUNTER — Telehealth: Payer: Self-pay | Admitting: Interventional Cardiology

## 2014-10-24 NOTE — Telephone Encounter (Signed)
New Msg         Pt calling to get results of stress test.   Please return call.

## 2014-10-24 NOTE — Telephone Encounter (Signed)
-----   Message from Belva Crome, MD sent at 10/20/2014  5:35 PM EST ----- The study is normal.

## 2014-10-24 NOTE — Telephone Encounter (Signed)
pt aware of myoview results.The study is normal.pt verbalized understandng.

## 2014-11-22 ENCOUNTER — Ambulatory Visit (HOSPITAL_BASED_OUTPATIENT_CLINIC_OR_DEPARTMENT_OTHER): Payer: Medicare Other | Admitting: Oncology

## 2014-11-22 ENCOUNTER — Telehealth: Payer: Self-pay | Admitting: Oncology

## 2014-11-22 ENCOUNTER — Other Ambulatory Visit (HOSPITAL_BASED_OUTPATIENT_CLINIC_OR_DEPARTMENT_OTHER): Payer: Medicare Other

## 2014-11-22 VITALS — BP 125/53 | HR 95 | Temp 98.4°F | Resp 18 | Ht 62.0 in | Wt 193.3 lb

## 2014-11-22 DIAGNOSIS — D509 Iron deficiency anemia, unspecified: Secondary | ICD-10-CM

## 2014-11-22 DIAGNOSIS — D72829 Elevated white blood cell count, unspecified: Secondary | ICD-10-CM

## 2014-11-22 DIAGNOSIS — D500 Iron deficiency anemia secondary to blood loss (chronic): Secondary | ICD-10-CM

## 2014-11-22 LAB — CBC WITH DIFFERENTIAL/PLATELET
BASO%: 0.3 % (ref 0.0–2.0)
Basophils Absolute: 0 10*3/uL (ref 0.0–0.1)
EOS%: 6.8 % (ref 0.0–7.0)
Eosinophils Absolute: 1 10*3/uL — ABNORMAL HIGH (ref 0.0–0.5)
HCT: 41 % (ref 34.8–46.6)
HGB: 12.6 g/dL (ref 11.6–15.9)
LYMPH%: 16.9 % (ref 14.0–49.7)
MCH: 28.3 pg (ref 25.1–34.0)
MCHC: 30.7 g/dL — ABNORMAL LOW (ref 31.5–36.0)
MCV: 91.9 fL (ref 79.5–101.0)
MONO#: 1.1 10*3/uL — ABNORMAL HIGH (ref 0.1–0.9)
MONO%: 7.3 % (ref 0.0–14.0)
NEUT#: 10.5 10*3/uL — ABNORMAL HIGH (ref 1.5–6.5)
NEUT%: 68.7 % (ref 38.4–76.8)
Platelets: 234 10*3/uL (ref 145–400)
RBC: 4.46 10*6/uL (ref 3.70–5.45)
RDW: 17.2 % — ABNORMAL HIGH (ref 11.2–14.5)
WBC: 15.2 10*3/uL — ABNORMAL HIGH (ref 3.9–10.3)
lymph#: 2.6 10*3/uL (ref 0.9–3.3)

## 2014-11-22 LAB — IRON AND TIBC CHCC
%SAT: 13 % — ABNORMAL LOW (ref 21–57)
Iron: 45 ug/dL (ref 41–142)
TIBC: 349 ug/dL (ref 236–444)
UIBC: 304 ug/dL (ref 120–384)

## 2014-11-22 LAB — FERRITIN CHCC: Ferritin: 101 ng/ml (ref 9–269)

## 2014-11-22 NOTE — Telephone Encounter (Signed)
gave and printed appt sched and avs for pt for Sept.... °

## 2014-11-22 NOTE — Progress Notes (Signed)
Hematology and Oncology Follow Up Visit  Christina Bennett [id number] February 21, 1941 74 y.o. 11/22/2014 10:11 AM   Principle Diagnosis: 74 year old with iron deficiency anemia diagnosed in 04/2012. Her GI work up did not show a clear cut source of bleeding.  Prior therapy: IV iron in the form of Feraheme of total 1000 mg given in January 2016.  Current therapy: Observation and surveillance.  Interim History: Christina Bennett presents today for a follow up visit. Since her last visit, she received IV iron in January 2016 without any incident. She reported there is some improvement in her energy but not dramatically different. She is no longer taking oral iron as you have reported diarrhea related to it.  She did not report any fatigue or tiredness. Had not had any constitutional symptoms. No major changes in her performance status. No bleeding noted at this time. He does not report any headaches or blurred vision or double vision. She does not report any syncope or seizures. She did not report any chest pain or shortness of breath. Is not reporting any frequency urgency or hesitancy. Her remaining review of systems unremarkable.    Medications: I have reviewed the patient's current medications.  Current Outpatient Prescriptions  Medication Sig Dispense Refill  . acyclovir (ZOVIRAX) 400 MG tablet Take 400 mg by mouth daily.    Marland Kitchen albuterol (PROVENTIL HFA;VENTOLIN HFA) 108 (90 BASE) MCG/ACT inhaler Inhale 2 puffs into the lungs every 4 (four) hours as needed.    . fish oil-omega-3 fatty acids 1000 MG capsule Take 3 g by mouth daily.    . fluticasone (FLOVENT DISKUS) 50 MCG/BLIST diskus inhaler Inhale 2 puffs into the lungs daily.    Marland Kitchen HYDROcodone-acetaminophen (NORCO/VICODIN) 5-325 MG per tablet   0  . losartan-hydrochlorothiazide (HYZAAR) 50-12.5 MG per tablet Take 1 tablet by mouth daily.   2  . metFORMIN (GLUCOPHAGE) 500 MG tablet Take 500 mg by mouth 2 (two) times daily with a meal.    . omeprazole  (PRILOSEC) 10 MG capsule Take 10 mg by mouth daily. Unsure of dose    . prednisoLONE acetate (PRED FORTE) 1 % ophthalmic suspension   5  . simvastatin (ZOCOR) 40 MG tablet Take 40 mg by mouth every evening.    . SYMBICORT 160-4.5 MCG/ACT inhaler   4  . Vitamin D, Ergocalciferol, (DRISDOL) 50000 UNITS CAPS Take 50,000 Units by mouth every 14 (fourteen) days.     No current facility-administered medications for this visit.    Allergies:  Allergies  Allergen Reactions  . Bee Venom Anaphylaxis  . Ibuprofen   . Percocet [Oxycodone-Acetaminophen]     Diabetes type 2- 1/13- metformin started  . Aspirin Rash    Past Medical History, Surgical history, Social history, and Family History were reviewed and updated.    Physical Exam: Blood pressure 125/53, pulse 95, temperature 98.4 F (36.9 C), temperature source Oral, resp. rate 18, height 5\' 2"  (1.575 m), weight 193 lb 4.8 oz (87.68 kg), SpO2 98 %. ECOG: 0 General appearance: alert awake not in any distress Head: Normocephalic, without obvious abnormality Neck: no adenopathy Lymph nodes: Cervical, supraclavicular, and axillary nodes normal. Heart:regular rate and rhythm, S1, S2 normal, no murmur, click, rub or gallop Lung:chest clear, no wheezing, rales, normal symmetric air entry Abdomen: soft, non-tender, without masses or organomegaly EXT:no erythema, induration, or nodules   Lab Results: Lab Results  Component Value Date   WBC 15.2* 11/22/2014   HGB 12.6 11/22/2014   HCT 41.0 11/22/2014  MCV 91.9 11/22/2014   PLT 234 11/22/2014     Chemistry      Component Value Date/Time   NA 142 07/19/2014 0939   K 4.2 07/19/2014 0939   CL 104 04/23/2012 1017   CO2 27 07/19/2014 0939   BUN 14.8 07/19/2014 0939   CREATININE 0.8 07/19/2014 0939      Component Value Date/Time   CALCIUM 9.7 07/19/2014 0939   ALKPHOS 81 07/19/2014 0939   AST 11 07/19/2014 0939   ALT <6 07/19/2014 0939   BILITOT 0.34 07/19/2014 0939       Impression and Plan:  This is a pleasant 74 year old woman with the following issues:   1. 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 She is status post IV iron in January 2016 with normalization of her hemoglobin. At this time, I have recommended continuing with observation surveillance and recheck her iron stores and 5-6 months. We can repeat IV iron if needed to the future.  2. Leukocytosis. This appears to be a reactive in nature. Her differential is normal today.  3. Followup: Will be in 5 months to repeat laboratory testing.     Christina Bennett 4/12/201610:11 AM

## 2014-11-23 DIAGNOSIS — H02052 Trichiasis without entropian right lower eyelid: Secondary | ICD-10-CM | POA: Diagnosis not present

## 2015-03-29 DIAGNOSIS — H01021 Squamous blepharitis right upper eyelid: Secondary | ICD-10-CM | POA: Diagnosis not present

## 2015-03-29 DIAGNOSIS — Z961 Presence of intraocular lens: Secondary | ICD-10-CM | POA: Diagnosis not present

## 2015-03-29 DIAGNOSIS — H01025 Squamous blepharitis left lower eyelid: Secondary | ICD-10-CM | POA: Diagnosis not present

## 2015-03-29 DIAGNOSIS — H01024 Squamous blepharitis left upper eyelid: Secondary | ICD-10-CM | POA: Diagnosis not present

## 2015-03-29 DIAGNOSIS — H2511 Age-related nuclear cataract, right eye: Secondary | ICD-10-CM | POA: Diagnosis not present

## 2015-03-29 DIAGNOSIS — H01022 Squamous blepharitis right lower eyelid: Secondary | ICD-10-CM | POA: Diagnosis not present

## 2015-04-05 DIAGNOSIS — Z0000 Encounter for general adult medical examination without abnormal findings: Secondary | ICD-10-CM | POA: Diagnosis not present

## 2015-04-12 ENCOUNTER — Other Ambulatory Visit: Payer: Self-pay | Admitting: Internal Medicine

## 2015-04-12 ENCOUNTER — Ambulatory Visit
Admission: RE | Admit: 2015-04-12 | Discharge: 2015-04-12 | Disposition: A | Discharge status: Home / Self Care | Payer: Medicare Other | Source: Ambulatory Visit | Attending: Internal Medicine | Admitting: Internal Medicine

## 2015-04-12 DIAGNOSIS — E782 Mixed hyperlipidemia: Secondary | ICD-10-CM | POA: Diagnosis not present

## 2015-04-12 DIAGNOSIS — J449 Chronic obstructive pulmonary disease, unspecified: Secondary | ICD-10-CM | POA: Diagnosis not present

## 2015-04-12 DIAGNOSIS — I10 Essential (primary) hypertension: Secondary | ICD-10-CM | POA: Diagnosis not present

## 2015-04-12 DIAGNOSIS — R059 Cough, unspecified: Secondary | ICD-10-CM

## 2015-04-12 DIAGNOSIS — M8588 Other specified disorders of bone density and structure, other site: Secondary | ICD-10-CM | POA: Diagnosis not present

## 2015-04-12 DIAGNOSIS — R05 Cough: Secondary | ICD-10-CM

## 2015-04-12 DIAGNOSIS — E119 Type 2 diabetes mellitus without complications: Secondary | ICD-10-CM | POA: Diagnosis not present

## 2015-04-12 DIAGNOSIS — Z0000 Encounter for general adult medical examination without abnormal findings: Secondary | ICD-10-CM | POA: Diagnosis not present

## 2015-04-12 DIAGNOSIS — E611 Iron deficiency: Secondary | ICD-10-CM | POA: Diagnosis not present

## 2015-04-12 DIAGNOSIS — M1990 Unspecified osteoarthritis, unspecified site: Secondary | ICD-10-CM | POA: Diagnosis not present

## 2015-04-12 DIAGNOSIS — Z1389 Encounter for screening for other disorder: Secondary | ICD-10-CM | POA: Diagnosis not present

## 2015-05-02 ENCOUNTER — Other Ambulatory Visit (HOSPITAL_BASED_OUTPATIENT_CLINIC_OR_DEPARTMENT_OTHER): Payer: Medicare Other

## 2015-05-02 ENCOUNTER — Ambulatory Visit (HOSPITAL_BASED_OUTPATIENT_CLINIC_OR_DEPARTMENT_OTHER): Payer: Medicare Other | Admitting: Oncology

## 2015-05-02 ENCOUNTER — Telehealth: Payer: Self-pay | Admitting: Oncology

## 2015-05-02 VITALS — BP 137/64 | HR 86 | Temp 98.4°F | Resp 18 | Ht 62.0 in | Wt 196.4 lb

## 2015-05-02 DIAGNOSIS — D509 Iron deficiency anemia, unspecified: Secondary | ICD-10-CM | POA: Diagnosis not present

## 2015-05-02 DIAGNOSIS — I25119 Atherosclerotic heart disease of native coronary artery with unspecified angina pectoris: Secondary | ICD-10-CM

## 2015-05-02 LAB — CBC WITH DIFFERENTIAL/PLATELET
BASO%: 0.2 % (ref 0.0–2.0)
Basophils Absolute: 0 10*3/uL (ref 0.0–0.1)
EOS%: 5.2 % (ref 0.0–7.0)
Eosinophils Absolute: 0.6 10*3/uL — ABNORMAL HIGH (ref 0.0–0.5)
HCT: 38.5 % (ref 34.8–46.6)
HGB: 11.8 g/dL (ref 11.6–15.9)
LYMPH%: 17.6 % (ref 14.0–49.7)
MCH: 26.7 pg (ref 25.1–34.0)
MCHC: 30.6 g/dL — ABNORMAL LOW (ref 31.5–36.0)
MCV: 87.1 fL (ref 79.5–101.0)
MONO#: 0.9 10*3/uL (ref 0.1–0.9)
MONO%: 7.8 % (ref 0.0–14.0)
NEUT#: 8.3 10*3/uL — ABNORMAL HIGH (ref 1.5–6.5)
NEUT%: 69.2 % (ref 38.4–76.8)
Platelets: 244 10*3/uL (ref 145–400)
RBC: 4.42 10*6/uL (ref 3.70–5.45)
RDW: 14.9 % — ABNORMAL HIGH (ref 11.2–14.5)
WBC: 12 10*3/uL — ABNORMAL HIGH (ref 3.9–10.3)
lymph#: 2.1 10*3/uL (ref 0.9–3.3)

## 2015-05-02 LAB — COMPREHENSIVE METABOLIC PANEL (CC13)
ALT: 7 U/L (ref 0–55)
AST: 12 U/L (ref 5–34)
Albumin: 3.5 g/dL (ref 3.5–5.0)
Alkaline Phosphatase: 82 U/L (ref 40–150)
Anion Gap: 6 mEq/L (ref 3–11)
BUN: 13 mg/dL (ref 7.0–26.0)
CO2: 28 mEq/L (ref 22–29)
Calcium: 9.4 mg/dL (ref 8.4–10.4)
Chloride: 107 mEq/L (ref 98–109)
Creatinine: 0.7 mg/dL (ref 0.6–1.1)
EGFR: 82 mL/min/{1.73_m2} — ABNORMAL LOW (ref >90)
Glucose: 103 mg/dl (ref 70–140)
Potassium: 4.2 mEq/L (ref 3.5–5.1)
Sodium: 141 mEq/L (ref 136–145)
Total Bilirubin: 0.38 mg/dL (ref 0.20–1.20)
Total Protein: 6.6 g/dL (ref 6.4–8.3)

## 2015-05-02 LAB — IRON AND TIBC CHCC
%SAT: 15 % — ABNORMAL LOW (ref 21–57)
Iron: 47 ug/dL (ref 41–142)
TIBC: 319 ug/dL (ref 236–444)
UIBC: 272 ug/dL (ref 120–384)

## 2015-05-02 LAB — FERRITIN CHCC: Ferritin: 28 ng/ml (ref 9–269)

## 2015-05-02 NOTE — Telephone Encounter (Signed)
per pof to sch pt appt-gave pt copy of avs °

## 2015-05-02 NOTE — Progress Notes (Signed)
Hematology and Oncology Follow Up Bennett  Christina Bennett [id number] 03/27/1941 74 y.o. 05/02/2015 10:17 AM   Principle Diagnosis: 74 year old with iron deficiency anemia diagnosed in 04/2012. Her GI work up did not show a clear cut source of bleeding.  Prior therapy: IV iron in the form of Feraheme of total 1000 mg given in January 2016.  Current therapy: Observation and surveillance.  Interim History: Christina Bennett presents today for a follow up Bennett. Since her last Bennett, she continues to feel well. Her energy and performance status remains improved after she have received IV iron in January 2016 without any incident. She is no longer taking oral iron as you have reported diarrhea related to it. No bleeding noted at this time. She did have a dental work and some mild bleeding afterwards but no other complaints. She has not reported any hematochezia or melena. She does not report any epistaxis or any GU bleeding.  He does not report any headaches or blurred vision or double vision. She does not report any syncope or seizures. She did not report any chest pain or shortness of breath. She does not report any cough or hemoptysis. She does not report any nausea, vomiting or abdominal pain. Is not reporting any frequency urgency or hesitancy. Her remaining review of systems unremarkable.    Medications: I have reviewed the patient's current medications.  Current Outpatient Prescriptions  Medication Sig Dispense Refill  . acyclovir (ZOVIRAX) 400 MG tablet Take 400 mg by mouth daily.    Marland Kitchen albuterol (PROVENTIL HFA;VENTOLIN HFA) 108 (90 BASE) MCG/ACT inhaler Inhale 2 puffs into the lungs every 4 (four) hours as needed.    . fish oil-omega-3 fatty acids 1000 MG capsule Take 3 g by mouth daily.    . fluticasone (FLONASE) 50 MCG/ACT nasal spray as directed.    . fluticasone (FLOVENT DISKUS) 50 MCG/BLIST diskus inhaler Inhale 2 puffs into the lungs daily.    Marland Kitchen HYDROcodone-acetaminophen (NORCO/VICODIN)  5-325 MG per tablet   0  . losartan-hydrochlorothiazide (HYZAAR) 50-12.5 MG per tablet Take 1 tablet by mouth daily.   2  . metFORMIN (GLUCOPHAGE) 500 MG tablet Take 500 mg by mouth 2 (two) times daily with a meal.    . omeprazole (PRILOSEC) 10 MG capsule Take 10 mg by mouth daily. Unsure of dose    . prednisoLONE acetate (PRED FORTE) 1 % ophthalmic suspension   5  . simvastatin (ZOCOR) 40 MG tablet Take 40 mg by mouth every evening.    . SYMBICORT 160-4.5 MCG/ACT inhaler   4  . Vitamin D, Ergocalciferol, (DRISDOL) 50000 UNITS CAPS Take 50,000 Units by mouth every 14 (fourteen) days.     No current facility-administered medications for this Bennett.    Allergies:  Allergies  Allergen Reactions  . Bee Venom Anaphylaxis  . Ibuprofen   . Percocet [Oxycodone-Acetaminophen]     Diabetes type 2- 1/13- metformin started  . Aspirin Rash    Past Medical History, Surgical history, Social history, and Family History were reviewed and updated.    Physical Exam: Blood pressure 137/64, pulse 86, temperature 98.4 F (36.9 C), temperature source Oral, resp. rate 18, height 5\' 2"  (1.575 m), weight 196 lb 6.4 oz (89.086 kg). ECOG: 0 General appearance: alert awake woman without distress. Head: Normocephalic, without obvious abnormality Neck: no adenopathy Lymph nodes: Cervical, supraclavicular, and axillary nodes normal. Heart:regular rate and rhythm, S1, S2 normal, no murmur, click, rub or gallop Lung:chest clear, no wheezing, rales, normal symmetric air  entry Abdomen: soft, non-tender, without masses or organomegaly EXT:no erythema, induration, or nodules   Lab Results: Lab Results  Component Value Date   WBC 12.0* 05/02/2015   HGB 11.8 05/02/2015   HCT 38.5 05/02/2015   MCV 87.1 05/02/2015   PLT 244 05/02/2015     Chemistry      Component Value Date/Time   NA 142 07/19/2014 0939   K 4.2 07/19/2014 0939   CL 104 04/23/2012 1017   CO2 27 07/19/2014 0939   BUN 14.8 07/19/2014 0939    CREATININE 0.8 07/19/2014 0939      Component Value Date/Time   CALCIUM 9.7 07/19/2014 0939   ALKPHOS 81 07/19/2014 0939   AST 11 07/19/2014 0939   ALT <6 07/19/2014 0939   BILITOT 0.34 07/19/2014 0939      Impression and Plan:  This is a pleasant 74 year old woman with the following issues:   1. 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 She is status post IV iron in January 2016 with normalization of her hemoglobin. Her hemoglobin continues to be within normal range and we will continue to monitor her iron stores. If she develops worsening iron deficiency, repeat IV iron infusion will be done at that time.  2. Leukocytosis. This appears to be a reactive in nature. Her differential is normal today. Her white cell count has been fluctuating close to the normal range.  3. Followup: Will be in 4 months to repeat laboratory testing.     ZNBVAP,OLIDC 9/20/201610:17 AM

## 2015-05-04 ENCOUNTER — Other Ambulatory Visit: Payer: Self-pay

## 2015-05-04 DIAGNOSIS — M859 Disorder of bone density and structure, unspecified: Secondary | ICD-10-CM | POA: Diagnosis not present

## 2015-05-04 DIAGNOSIS — Z1231 Encounter for screening mammogram for malignant neoplasm of breast: Secondary | ICD-10-CM

## 2015-05-04 DIAGNOSIS — M8589 Other specified disorders of bone density and structure, multiple sites: Secondary | ICD-10-CM | POA: Diagnosis not present

## 2015-05-29 DIAGNOSIS — H01024 Squamous blepharitis left upper eyelid: Secondary | ICD-10-CM | POA: Diagnosis not present

## 2015-05-29 DIAGNOSIS — H01022 Squamous blepharitis right lower eyelid: Secondary | ICD-10-CM | POA: Diagnosis not present

## 2015-05-29 DIAGNOSIS — H01021 Squamous blepharitis right upper eyelid: Secondary | ICD-10-CM | POA: Diagnosis not present

## 2015-05-29 DIAGNOSIS — Z961 Presence of intraocular lens: Secondary | ICD-10-CM | POA: Diagnosis not present

## 2015-05-29 DIAGNOSIS — B0052 Herpesviral keratitis: Secondary | ICD-10-CM | POA: Diagnosis not present

## 2015-05-29 DIAGNOSIS — H2511 Age-related nuclear cataract, right eye: Secondary | ICD-10-CM | POA: Diagnosis not present

## 2015-05-29 DIAGNOSIS — Z23 Encounter for immunization: Secondary | ICD-10-CM | POA: Diagnosis not present

## 2015-05-29 DIAGNOSIS — H01025 Squamous blepharitis left lower eyelid: Secondary | ICD-10-CM | POA: Diagnosis not present

## 2015-05-31 DIAGNOSIS — H16042 Marginal corneal ulcer, left eye: Secondary | ICD-10-CM | POA: Diagnosis not present

## 2015-05-31 DIAGNOSIS — B0052 Herpesviral keratitis: Secondary | ICD-10-CM | POA: Diagnosis not present

## 2015-06-05 DIAGNOSIS — B0052 Herpesviral keratitis: Secondary | ICD-10-CM | POA: Diagnosis not present

## 2015-06-05 DIAGNOSIS — H16042 Marginal corneal ulcer, left eye: Secondary | ICD-10-CM | POA: Diagnosis not present

## 2015-06-07 ENCOUNTER — Ambulatory Visit
Admission: RE | Admit: 2015-06-07 | Discharge: 2015-06-07 | Disposition: A | Discharge status: Home / Self Care | Payer: Medicare Other | Source: Ambulatory Visit

## 2015-06-07 DIAGNOSIS — Z1231 Encounter for screening mammogram for malignant neoplasm of breast: Secondary | ICD-10-CM

## 2015-06-08 ENCOUNTER — Ambulatory Visit: Payer: Medicare Other

## 2015-06-12 DIAGNOSIS — B0052 Herpesviral keratitis: Secondary | ICD-10-CM | POA: Diagnosis not present

## 2015-06-12 DIAGNOSIS — H2511 Age-related nuclear cataract, right eye: Secondary | ICD-10-CM | POA: Diagnosis not present

## 2015-07-03 DIAGNOSIS — H01025 Squamous blepharitis left lower eyelid: Secondary | ICD-10-CM | POA: Diagnosis not present

## 2015-07-03 DIAGNOSIS — H01024 Squamous blepharitis left upper eyelid: Secondary | ICD-10-CM | POA: Diagnosis not present

## 2015-07-03 DIAGNOSIS — H2511 Age-related nuclear cataract, right eye: Secondary | ICD-10-CM | POA: Diagnosis not present

## 2015-07-03 DIAGNOSIS — H01021 Squamous blepharitis right upper eyelid: Secondary | ICD-10-CM | POA: Diagnosis not present

## 2015-07-03 DIAGNOSIS — H01022 Squamous blepharitis right lower eyelid: Secondary | ICD-10-CM | POA: Diagnosis not present

## 2015-07-03 DIAGNOSIS — B0052 Herpesviral keratitis: Secondary | ICD-10-CM | POA: Diagnosis not present

## 2015-08-31 ENCOUNTER — Telehealth: Payer: Self-pay | Admitting: Oncology

## 2015-08-31 NOTE — Telephone Encounter (Signed)
patient called and r/s 1/20 lab/fu to 2/7 - patient has new date/time

## 2015-09-01 ENCOUNTER — Ambulatory Visit: Payer: Medicare Other | Admitting: Oncology

## 2015-09-01 ENCOUNTER — Other Ambulatory Visit: Payer: Medicare Other

## 2015-09-06 DIAGNOSIS — H01021 Squamous blepharitis right upper eyelid: Secondary | ICD-10-CM | POA: Diagnosis not present

## 2015-09-06 DIAGNOSIS — H01022 Squamous blepharitis right lower eyelid: Secondary | ICD-10-CM | POA: Diagnosis not present

## 2015-09-06 DIAGNOSIS — H25811 Combined forms of age-related cataract, right eye: Secondary | ICD-10-CM | POA: Diagnosis not present

## 2015-09-06 DIAGNOSIS — Z961 Presence of intraocular lens: Secondary | ICD-10-CM | POA: Diagnosis not present

## 2015-09-06 DIAGNOSIS — H01024 Squamous blepharitis left upper eyelid: Secondary | ICD-10-CM | POA: Diagnosis not present

## 2015-09-06 DIAGNOSIS — B0052 Herpesviral keratitis: Secondary | ICD-10-CM | POA: Diagnosis not present

## 2015-09-06 DIAGNOSIS — H01025 Squamous blepharitis left lower eyelid: Secondary | ICD-10-CM | POA: Diagnosis not present

## 2015-09-19 ENCOUNTER — Telehealth: Payer: Self-pay | Admitting: Oncology

## 2015-09-19 ENCOUNTER — Other Ambulatory Visit (HOSPITAL_BASED_OUTPATIENT_CLINIC_OR_DEPARTMENT_OTHER): Payer: Medicare Other

## 2015-09-19 ENCOUNTER — Ambulatory Visit (HOSPITAL_BASED_OUTPATIENT_CLINIC_OR_DEPARTMENT_OTHER): Payer: Medicare Other | Admitting: Oncology

## 2015-09-19 VITALS — BP 125/58 | HR 105 | Temp 98.7°F | Resp 19 | Wt 194.8 lb

## 2015-09-19 DIAGNOSIS — D72829 Elevated white blood cell count, unspecified: Secondary | ICD-10-CM

## 2015-09-19 DIAGNOSIS — D509 Iron deficiency anemia, unspecified: Secondary | ICD-10-CM | POA: Diagnosis not present

## 2015-09-19 LAB — CBC WITH DIFFERENTIAL/PLATELET
BASO%: 0.2 % (ref 0.0–2.0)
Basophils Absolute: 0 10*3/uL (ref 0.0–0.1)
EOS%: 4.9 % (ref 0.0–7.0)
Eosinophils Absolute: 0.8 10*3/uL — ABNORMAL HIGH (ref 0.0–0.5)
HCT: 37.3 % (ref 34.8–46.6)
HGB: 11.3 g/dL — ABNORMAL LOW (ref 11.6–15.9)
LYMPH%: 14.7 % (ref 14.0–49.7)
MCH: 25.2 pg (ref 25.1–34.0)
MCHC: 30.3 g/dL — ABNORMAL LOW (ref 31.5–36.0)
MCV: 83.3 fL (ref 79.5–101.0)
MONO#: 1.1 10*3/uL — ABNORMAL HIGH (ref 0.1–0.9)
MONO%: 6.5 % (ref 0.0–14.0)
NEUT#: 12.6 10*3/uL — ABNORMAL HIGH (ref 1.5–6.5)
NEUT%: 73.7 % (ref 38.4–76.8)
Platelets: 294 10*3/uL (ref 145–400)
RBC: 4.48 10*6/uL (ref 3.70–5.45)
RDW: 15.8 % — ABNORMAL HIGH (ref 11.2–14.5)
WBC: 17.1 10*3/uL — ABNORMAL HIGH (ref 3.9–10.3)
lymph#: 2.5 10*3/uL (ref 0.9–3.3)
nRBC: 0 % (ref 0–0)

## 2015-09-19 LAB — IRON AND TIBC
%SAT: 7 % — ABNORMAL LOW (ref 21–57)
Iron: 24 ug/dL — ABNORMAL LOW (ref 41–142)
TIBC: 349 ug/dL (ref 236–444)
UIBC: 325 ug/dL (ref 120–384)

## 2015-09-19 LAB — FERRITIN: Ferritin: 19 ng/ml (ref 9–269)

## 2015-09-19 NOTE — Progress Notes (Signed)
Hematology and Oncology Follow Up Visit  Christina Bennett [id number] [birth year] 75 y.o. 09/19/2015 2:59 PM   Principle Diagnosis: 75 year old with iron deficiency anemia diagnosed in 04/2012. Her GI work up did not show a clear cut source of bleeding.  Prior therapy: IV iron in the form of Feraheme of total 1000 mg given in January 2016.  Current therapy: Observation and surveillance.  Interim History: Christina Bennett presents today for a follow up visit. Since her last visit, she reports a upper respiratory tract infection that is still recovering from. She reports sinus congestion and nonproductive cough. That have affected her energy and have been overall fatigued. She is no longer taking oral iron. No bleeding noted at this time. She has not reported any hematochezia or melena. She does not report any epistaxis or any GU bleeding.  He does not report any headaches or blurred vision or double vision. She does not report any syncope or seizures. She did not report any chest pain or shortness of breath. She does not report any hemoptysis. She does not report any nausea, vomiting or abdominal pain. Is not reporting any frequency urgency or hesitancy. Her remaining review of systems unremarkable.    Medications: I have reviewed the patient's current medications.  Current Outpatient Prescriptions  Medication Sig Dispense Refill  . acyclovir (ZOVIRAX) 400 MG tablet Take 400 mg by mouth daily.    Marland Kitchen albuterol (PROVENTIL HFA;VENTOLIN HFA) 108 (90 BASE) MCG/ACT inhaler Inhale 2 puffs into the lungs every 4 (four) hours as needed.    . fluticasone (FLONASE) 50 MCG/ACT nasal spray as directed.    . fluticasone (FLOVENT DISKUS) 50 MCG/BLIST diskus inhaler Inhale 2 puffs into the lungs daily.    Marland Kitchen HYDROcodone-acetaminophen (NORCO/VICODIN) 5-325 MG per tablet   0  . losartan-hydrochlorothiazide (HYZAAR) 50-12.5 MG per tablet Take 1 tablet by mouth daily.   2  . metFORMIN (GLUCOPHAGE) 500 MG tablet Take 500 mg  by mouth 2 (two) times daily with a meal.    . omeprazole (PRILOSEC) 10 MG capsule Take 10 mg by mouth daily. Unsure of dose    . prednisoLONE acetate (PRED FORTE) 1 % ophthalmic suspension   5  . simvastatin (ZOCOR) 40 MG tablet Take 40 mg by mouth every evening.    . SYMBICORT 160-4.5 MCG/ACT inhaler   4  . Vitamin D, Ergocalciferol, (DRISDOL) 50000 UNITS CAPS Take 50,000 Units by mouth every 14 (fourteen) days.     No current facility-administered medications for this visit.    Allergies:  Allergies  Allergen Reactions  . Bee Venom Anaphylaxis  . Ibuprofen Shortness Of Breath and Swelling    Throat swells.  Marland Kitchen Percocet [Oxycodone-Acetaminophen] Itching  . Aspirin Rash    Past Medical History, Surgical history, Social history, and Family History were reviewed and updated.    Physical Exam: Blood pressure 125/58, pulse 105, temperature 98.7 F (37.1 C), temperature source Oral, resp. rate 19, weight 194 lb 12.8 oz (88.361 kg), SpO2 95 %. ECOG: 0 General appearance: alert awake woman without respiratory distress. Head: Normocephalic, without obvious abnormality No oral ulcers or lesions.  Neck: no adenopathy Lymph nodes: Cervical, supraclavicular, and axillary nodes normal. Heart:regular rate and rhythm, S1, S2 normal, no murmur, click, rub or gallop Lung:chest clear, no wheezing, rales, normal symmetric air entry Abdomen: soft, non-tender, without masses or organomegaly no shifting dullness or ascites.  EXT:no erythema, induration, or nodules   Lab Results: Lab Results  Component Value Date   WBC  17.1* 09/19/2015   HGB 11.3* 09/19/2015   HCT 37.3 09/19/2015   MCV 83.3 09/19/2015   PLT 294 09/19/2015     Chemistry      Component Value Date/Time   NA 141 05/02/2015 0952   K 4.2 05/02/2015 0952   CL 104 04/23/2012 1017   CO2 28 05/02/2015 0952   BUN 13.0 05/02/2015 0952   CREATININE 0.7 05/02/2015 0952      Component Value Date/Time   CALCIUM 9.4 05/02/2015 0952    ALKPHOS 82 05/02/2015 0952   AST 12 05/02/2015 0952   ALT 7 05/02/2015 0952   BILITOT 0.38 05/02/2015 0952      Impression and Plan:  This is a pleasant 75 year old woman with the following issues:   1. 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 She is status post IV iron in January 2016 with normalization of her hemoglobin. Her hemoglobin is slightly lower today and may be a sign of developing iron deficiency anemia. Her iron stores have been adequate and that will be repeated today. I discussed with her the risks and benefits of repeating IV iron potential in the future.  She is agreeable to it but prefers to have that done later on in the year. We will repeat iron studies in 4 months  and consider that at that time.  2. Leukocytosis. This appears to be a reactive in nature. Her differential continues to be normal at this time. We'll continue to monitor moving forward.    3. Followup: Will be in 4 months to repeat laboratory testing.     [id number] 2/7/20172:59 PM

## 2015-09-19 NOTE — Telephone Encounter (Signed)
Talked with and scheduled this patient’s appointment(s) while patient was here in our office.       [facility]. °

## 2015-10-11 DIAGNOSIS — M1990 Unspecified osteoarthritis, unspecified site: Secondary | ICD-10-CM | POA: Diagnosis not present

## 2015-10-11 DIAGNOSIS — I10 Essential (primary) hypertension: Secondary | ICD-10-CM | POA: Diagnosis not present

## 2015-10-11 DIAGNOSIS — E782 Mixed hyperlipidemia: Secondary | ICD-10-CM | POA: Diagnosis not present

## 2015-10-11 DIAGNOSIS — J449 Chronic obstructive pulmonary disease, unspecified: Secondary | ICD-10-CM | POA: Diagnosis not present

## 2015-10-11 DIAGNOSIS — J309 Allergic rhinitis, unspecified: Secondary | ICD-10-CM | POA: Diagnosis not present

## 2015-10-11 DIAGNOSIS — E611 Iron deficiency: Secondary | ICD-10-CM | POA: Diagnosis not present

## 2015-10-11 DIAGNOSIS — Z7984 Long term (current) use of oral hypoglycemic drugs: Secondary | ICD-10-CM | POA: Diagnosis not present

## 2015-10-11 DIAGNOSIS — E119 Type 2 diabetes mellitus without complications: Secondary | ICD-10-CM | POA: Diagnosis not present

## 2015-10-11 DIAGNOSIS — G8929 Other chronic pain: Secondary | ICD-10-CM | POA: Diagnosis not present

## 2015-12-11 DIAGNOSIS — H2511 Age-related nuclear cataract, right eye: Secondary | ICD-10-CM | POA: Diagnosis not present

## 2015-12-11 DIAGNOSIS — H02052 Trichiasis without entropian right lower eyelid: Secondary | ICD-10-CM | POA: Diagnosis not present

## 2015-12-11 DIAGNOSIS — E119 Type 2 diabetes mellitus without complications: Secondary | ICD-10-CM | POA: Diagnosis not present

## 2015-12-11 DIAGNOSIS — H02055 Trichiasis without entropian left lower eyelid: Secondary | ICD-10-CM | POA: Diagnosis not present

## 2015-12-11 DIAGNOSIS — Z961 Presence of intraocular lens: Secondary | ICD-10-CM | POA: Diagnosis not present

## 2015-12-11 DIAGNOSIS — B0052 Herpesviral keratitis: Secondary | ICD-10-CM | POA: Diagnosis not present

## 2015-12-29 ENCOUNTER — Telehealth: Payer: Self-pay | Admitting: Oncology

## 2015-12-29 NOTE — Telephone Encounter (Signed)
S/w pt, advised appt chg from 6/6 due to md pal. Gave new appt for 6/22 @ 9.30 but pt wants something later in the day. R/s'd 6/22 appt to 6/23 @ 12.45p. Pt verbalized understanding.

## 2016-01-16 ENCOUNTER — Ambulatory Visit: Payer: Medicare Other | Admitting: Oncology

## 2016-01-16 ENCOUNTER — Other Ambulatory Visit: Payer: Medicare Other

## 2016-02-01 ENCOUNTER — Ambulatory Visit: Payer: Medicare Other | Admitting: Oncology

## 2016-02-01 ENCOUNTER — Other Ambulatory Visit: Payer: Medicare Other

## 2016-02-02 ENCOUNTER — Ambulatory Visit (HOSPITAL_BASED_OUTPATIENT_CLINIC_OR_DEPARTMENT_OTHER): Payer: Medicare Other | Admitting: Oncology

## 2016-02-02 ENCOUNTER — Telehealth: Payer: Self-pay | Admitting: Oncology

## 2016-02-02 ENCOUNTER — Other Ambulatory Visit (HOSPITAL_BASED_OUTPATIENT_CLINIC_OR_DEPARTMENT_OTHER): Payer: Medicare Other

## 2016-02-02 VITALS — BP 134/64 | HR 88 | Temp 98.5°F | Resp 18 | Ht 62.0 in | Wt 194.3 lb

## 2016-02-02 DIAGNOSIS — D509 Iron deficiency anemia, unspecified: Secondary | ICD-10-CM

## 2016-02-02 DIAGNOSIS — D72829 Elevated white blood cell count, unspecified: Secondary | ICD-10-CM

## 2016-02-02 LAB — CBC WITH DIFFERENTIAL/PLATELET
BASO%: 0.2 % (ref 0.0–2.0)
Basophils Absolute: 0 10*3/uL (ref 0.0–0.1)
EOS%: 5.1 % (ref 0.0–7.0)
Eosinophils Absolute: 0.8 10*3/uL — ABNORMAL HIGH (ref 0.0–0.5)
HCT: 35.8 % (ref 34.8–46.6)
HGB: 10.8 g/dL — ABNORMAL LOW (ref 11.6–15.9)
LYMPH%: 18 % (ref 14.0–49.7)
MCH: 23.9 pg — ABNORMAL LOW (ref 25.1–34.0)
MCHC: 30.2 g/dL — ABNORMAL LOW (ref 31.5–36.0)
MCV: 79.4 fL — ABNORMAL LOW (ref 79.5–101.0)
MONO#: 1.1 10*3/uL — ABNORMAL HIGH (ref 0.1–0.9)
MONO%: 7.1 % (ref 0.0–14.0)
NEUT#: 10.3 10*3/uL — ABNORMAL HIGH (ref 1.5–6.5)
NEUT%: 69.6 % (ref 38.4–76.8)
Platelets: 322 10*3/uL (ref 145–400)
RBC: 4.51 10*6/uL (ref 3.70–5.45)
RDW: 16.6 % — ABNORMAL HIGH (ref 11.2–14.5)
WBC: 14.7 10*3/uL — ABNORMAL HIGH (ref 3.9–10.3)
lymph#: 2.7 10*3/uL (ref 0.9–3.3)
nRBC: 0 % (ref 0–0)

## 2016-02-02 LAB — IRON AND TIBC
%SAT: 6 % — ABNORMAL LOW (ref 21–57)
Iron: 22 ug/dL — ABNORMAL LOW (ref 41–142)
TIBC: 379 ug/dL (ref 236–444)
UIBC: 357 ug/dL (ref 120–384)

## 2016-02-02 LAB — FERRITIN: Ferritin: 11 ng/ml (ref 9–269)

## 2016-02-02 NOTE — Telephone Encounter (Signed)
Gave and pritned appt sched and avs for pt for June July and Sept

## 2016-02-02 NOTE — Progress Notes (Signed)
Hematology and Oncology Follow Up Visit  Christina Bennett [id number] 1941/03/24 75 y.o. 02/02/2016 1:04 PM   Principle Diagnosis: 75- year old with iron deficiency anemia diagnosed in 04/2012. Her GI work up did not show a clear cut source of bleeding.  Prior therapy: IV iron in the form of Feraheme of total 1000 mg given in January 2016.  Current therapy: Observation and surveillance.  Interim History: [name] presents today for a follow up visit. Since her last visit, she reports slight decline in her energy and performance status. She has reported some fatigue and tiredness but denied any bleeding. She does not report any hematochezia or melena. Does not report any GU bleeding. She had been intolerant to oral iron in the past. She continues to perform activities of daily living without any decline. She does report some respiratory complaints at wheezing at times.  He does not report any headaches or blurred vision or double vision. She does not report any syncope or seizures. She did not report any chest pain or shortness of breath. She does not report any hemoptysis. She does not report any nausea, vomiting or abdominal pain. Is not reporting any frequency urgency or hesitancy. Her remaining review of systems unremarkable.    Medications: I have reviewed the patient's current medications.  Current Outpatient Prescriptions  Medication Sig Dispense Refill  . acyclovir (ZOVIRAX) 400 MG tablet Take 400 mg by mouth daily.    Marland Kitchen albuterol (PROVENTIL HFA;VENTOLIN HFA) 108 (90 BASE) MCG/ACT inhaler Inhale 2 puffs into the lungs every 4 (four) hours as needed.    . fluticasone (FLONASE) 50 MCG/ACT nasal spray as directed.    . fluticasone (FLOVENT DISKUS) 50 MCG/BLIST diskus inhaler Inhale 2 puffs into the lungs daily.    Marland Kitchen HYDROcodone-acetaminophen (NORCO/VICODIN) 5-325 MG per tablet   0  . losartan-hydrochlorothiazide (HYZAAR) 50-12.5 MG per tablet Take 1 tablet by mouth daily.   2  .  metFORMIN (GLUCOPHAGE) 500 MG tablet Take 500 mg by mouth 2 (two) times daily with a meal.    . omeprazole (PRILOSEC) 10 MG capsule Take 10 mg by mouth daily. Unsure of dose    . prednisoLONE acetate (PRED FORTE) 1 % ophthalmic suspension   5  . simvastatin (ZOCOR) 40 MG tablet Take 40 mg by mouth every evening.    . SYMBICORT 160-4.5 MCG/ACT inhaler   4  . Vitamin D, Ergocalciferol, (DRISDOL) 50000 UNITS CAPS Take 50,000 Units by mouth every 14 (fourteen) days.     No current facility-administered medications for this visit.    Allergies:  Allergies  Allergen Reactions  . Bee Venom Anaphylaxis  . Ibuprofen Shortness Of Breath and Swelling    Throat swells.  Marland Kitchen Percocet [Oxycodone-Acetaminophen] Itching  . Aspirin Rash    Past Medical History, Surgical history, Social history, and Family History were reviewed and updated.    Physical Exam: Blood pressure 134/64, pulse 88, temperature 98.5 F (36.9 C), temperature source Oral, resp. rate 18, height 5\' 2"  (1.575 m), weight 194 lb 4.8 oz (88.134 kg), SpO2 96 %. ECOG: 0 General appearance: Pleasant-appearing woman without distress. Head: Normocephalic, without obvious abnormality No oral thrush noted. Neck: no adenopathy Lymph nodes: Cervical, supraclavicular, and axillary nodes normal. Heart:regular rate and rhythm, S1, S2 normal, no murmur, click, rub or gallop Lung:chest clear, no wheezing, rales, normal symmetric air entry Abdomen: soft, non-tender, without masses or organomegaly no rebound or guarding. EXT:no erythema, induration, or nodules   Lab Results: Lab Results  Component  Value Date   WBC 17.1* 09/19/2015   HGB 11.3* 09/19/2015   HCT 37.3 09/19/2015   MCV 83.3 09/19/2015   PLT 294 09/19/2015     Chemistry      Component Value Date/Time   NA 141 05/02/2015 0952   K 4.2 05/02/2015 0952   CL 104 04/23/2012 1017   CO2 28 05/02/2015 0952   BUN 13.0 05/02/2015 0952   CREATININE 0.7 05/02/2015 0952       Component Value Date/Time   CALCIUM 9.4 05/02/2015 0952   ALKPHOS 82 05/02/2015 0952   AST 12 05/02/2015 0952   ALT 7 05/02/2015 0952   BILITOT 0.38 05/02/2015 0952      Impression and Plan:  This is a pleasant 75 year old woman with the following issues:   1. 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 She is status post IV iron in January 2016 with normalization of her hemoglobin.   Her iron studies from February 2017 showed declining iron levels as well as ferritin. She is more symptomatic at this time and likely will require intravenous iron. Her iron studies are currently pending but she is willing to proceed with Feraheme if needed.  Risks and benefits of this treatment were reviewed and she is agreeable. She will receive a total of 1000 mg if her iron is low.  2. Leukocytosis. This appears to be a reactive in nature. Her differential continues to be normal at this time. We'll continue to monitor moving forward.    3. Followup: Will be in 4 months to repeat laboratory testing.     [facility] 6/23/20171:04 PM

## 2016-02-02 NOTE — Addendum Note (Signed)
Addended by: Randolm Idol on: 02/02/2016 01:17 PM   Modules accepted: Orders, Medications

## 2016-02-09 ENCOUNTER — Ambulatory Visit (HOSPITAL_BASED_OUTPATIENT_CLINIC_OR_DEPARTMENT_OTHER): Payer: Medicare Other

## 2016-02-09 VITALS — BP 126/65 | HR 92 | Temp 98.9°F | Resp 18

## 2016-02-09 DIAGNOSIS — D509 Iron deficiency anemia, unspecified: Secondary | ICD-10-CM | POA: Diagnosis present

## 2016-02-09 DIAGNOSIS — D500 Iron deficiency anemia secondary to blood loss (chronic): Secondary | ICD-10-CM

## 2016-02-09 MED ORDER — FERUMOXYTOL INJECTION 510 MG/17 ML
510.0000 mg | Freq: Once | INTRAVENOUS | Status: AC
  Administered 2016-02-09: 510 mg via INTRAVENOUS
  Filled 2016-02-09: qty 17

## 2016-02-09 MED ORDER — SODIUM CHLORIDE 0.9 % IV SOLN
Freq: Once | INTRAVENOUS | Status: AC
  Administered 2016-02-09: 11:00:00 via INTRAVENOUS

## 2016-02-09 NOTE — Patient Instructions (Signed)
Ferumoxytol injection What is this medicine? FERUMOXYTOL is an iron complex. Iron is used to make healthy red blood cells, which carry oxygen and nutrients throughout the body. This medicine is used to treat iron deficiency anemia in people with chronic kidney disease. This medicine may be used for other purposes; ask your health care provider or pharmacist if you have questions. What should I tell my health care provider before I take this medicine? They need to know if you have any of these conditions: -anemia not caused by low iron levels -high levels of iron in the blood -magnetic resonance imaging (MRI) test scheduled -an unusual or allergic reaction to iron, other medicines, foods, dyes, or preservatives -pregnant or trying to get pregnant -breast-feeding How should I use this medicine? This medicine is for injection into a vein. It is given by a health care professional in a hospital or clinic setting. Talk to your pediatrician regarding the use of this medicine in children. Special care may be needed. Overdosage: If you think you have taken too much of this medicine contact a poison control center or emergency room at once. NOTE: This medicine is only for you. Do not share this medicine with others. What if I miss a dose? It is important not to miss your dose. Call your doctor or health care professional if you are unable to keep an appointment. What may interact with this medicine? This medicine may interact with the following medications: -other iron products This list may not describe all possible interactions. Give your health care provider a list of all the medicines, herbs, non-prescription drugs, or dietary supplements you use. Also tell them if you smoke, drink alcohol, or use illegal drugs. Some items may interact with your medicine. What should I watch for while using this medicine? Visit your doctor or healthcare professional regularly. Tell your doctor or healthcare  professional if your symptoms do not start to get better or if they get worse. You may need blood work done while you are taking this medicine. You may need to follow a special diet. Talk to your doctor. Foods that contain iron include: whole grains/cereals, dried fruits, beans, or peas, leafy green vegetables, and organ meats (liver, kidney). What side effects may I notice from receiving this medicine? Side effects that you should report to your doctor or health care professional as soon as possible: -allergic reactions like skin rash, itching or hives, swelling of the face, lips, or tongue -breathing problems -changes in blood pressure -feeling faint or lightheaded, falls -fever or chills -flushing, sweating, or hot feelings -swelling of the ankles or feet Side effects that usually do not require medical attention (Report these to your doctor or health care professional if they continue or are bothersome.): -diarrhea -headache -nausea, vomiting -stomach pain This list may not describe all possible side effects. Call your doctor for medical advice about side effects. You may report side effects to FDA at 1-800-FDA-1088. Where should I keep my medicine? This drug is given in a hospital or clinic and will not be stored at home. NOTE: This sheet is a summary. It may not cover all possible information. If you have questions about this medicine, talk to your doctor, pharmacist, or health care provider.    2016, Elsevier/Gold Standard. (2012-03-13 15:23:36)    Iron Deficiency Anemia, Adult Anemia is a condition in which there are less red blood cells or hemoglobin in the blood than normal. Hemoglobin is the part of red blood cells that carries oxygen.   Iron deficiency anemia is anemia caused by too little iron. It is the most common type of anemia. It may leave you tired and short of breath. CAUSES   Lack of iron in the diet.  Poor absorption of iron, as seen with intestinal  disorders.  Intestinal bleeding.  Heavy periods. SIGNS AND SYMPTOMS  Mild anemia may not be noticeable. Symptoms may include:  Fatigue.  Headache.  Pale skin.  Weakness.  Tiredness.  Shortness of breath.  Dizziness.  Cold hands and feet.  Fast or irregular heartbeat. DIAGNOSIS  Diagnosis requires a thorough evaluation and physical exam by your health care provider. Blood tests are generally used to confirm iron deficiency anemia. Additional tests may be done to find the underlying cause of your anemia. These may include:  Testing for blood in the stool (fecal occult blood test).  A procedure to see inside the colon and rectum (colonoscopy).  A procedure to see inside the esophagus and stomach (endoscopy). TREATMENT  Iron deficiency anemia is treated by correcting the cause of the deficiency. Treatment may involve:  Adding iron-rich foods to your diet.  Taking iron supplements. Pregnant or breastfeeding women need to take extra iron because their normal diet usually does not provide the required amount.  Taking vitamins. Vitamin C improves the absorption of iron. Your health care provider may recommend that you take your iron tablets with a glass of orange juice or vitamin C supplement.  Medicines to make heavy menstrual flow lighter.  Surgery. HOME CARE INSTRUCTIONS   Take iron as directed by your health care provider.  If you cannot tolerate taking iron supplements by mouth, talk to your health care provider about taking them through a vein (intravenously) or an injection into a muscle.  For the best iron absorption, iron supplements should be taken on an empty stomach. If you cannot tolerate them on an empty stomach, you may need to take them with food.  Do not drink milk or take antacids at the same time as your iron supplements. Milk and antacids may interfere with the absorption of iron.  Iron supplements can cause constipation. Make sure to include fiber  in your diet to prevent constipation. A stool softener may also be recommended.  Take vitamins as directed by your health care provider.  Eat a diet rich in iron. Foods high in iron include liver, lean beef, whole-grain bread, eggs, dried fruit, and dark green leafy vegetables. SEEK IMMEDIATE MEDICAL CARE IF:   You faint. If this happens, do not drive. Call your local emergency services (911 in [place] if no other help is available.  You have chest pain.  You feel nauseous or vomit.  You have severe or increased shortness of breath with activity.  You feel weak.  You have a rapid heartbeat.  You have unexplained sweating.  You become light-headed when getting up from a chair or bed. MAKE SURE YOU:   Understand these instructions.  Will watch your condition.  Will get help right away if you are not doing well or get worse.   This information is not intended to replace advice given to you by your health care provider. Make sure you discuss any questions you have with your health care provider.   Document Released: 07/26/2000 Document Revised: 08/19/2014 Document Reviewed: 04/05/2013 Elsevier Interactive Patient Education 2016 Elsevier Inc.  

## 2016-02-16 ENCOUNTER — Ambulatory Visit (HOSPITAL_BASED_OUTPATIENT_CLINIC_OR_DEPARTMENT_OTHER): Payer: Medicare Other

## 2016-02-16 VITALS — BP 125/60 | HR 82 | Temp 98.8°F | Resp 18

## 2016-02-16 DIAGNOSIS — D500 Iron deficiency anemia secondary to blood loss (chronic): Secondary | ICD-10-CM

## 2016-02-16 DIAGNOSIS — D509 Iron deficiency anemia, unspecified: Secondary | ICD-10-CM | POA: Diagnosis present

## 2016-02-16 MED ORDER — SODIUM CHLORIDE 0.9 % IV SOLN
510.0000 mg | Freq: Once | INTRAVENOUS | Status: AC
  Administered 2016-02-16: 510 mg via INTRAVENOUS
  Filled 2016-02-16: qty 17

## 2016-02-16 MED ORDER — SODIUM CHLORIDE 0.9 % IV SOLN
Freq: Once | INTRAVENOUS | Status: AC
  Administered 2016-02-16: 11:00:00 via INTRAVENOUS

## 2016-02-16 NOTE — Patient Instructions (Signed)

## 2016-03-04 DIAGNOSIS — H2511 Age-related nuclear cataract, right eye: Secondary | ICD-10-CM | POA: Diagnosis not present

## 2016-03-04 DIAGNOSIS — H01021 Squamous blepharitis right upper eyelid: Secondary | ICD-10-CM | POA: Diagnosis not present

## 2016-03-04 DIAGNOSIS — H01024 Squamous blepharitis left upper eyelid: Secondary | ICD-10-CM | POA: Diagnosis not present

## 2016-03-04 DIAGNOSIS — B0052 Herpesviral keratitis: Secondary | ICD-10-CM | POA: Diagnosis not present

## 2016-03-04 DIAGNOSIS — H01022 Squamous blepharitis right lower eyelid: Secondary | ICD-10-CM | POA: Diagnosis not present

## 2016-03-04 DIAGNOSIS — H01025 Squamous blepharitis left lower eyelid: Secondary | ICD-10-CM | POA: Diagnosis not present

## 2016-03-11 DIAGNOSIS — H01022 Squamous blepharitis right lower eyelid: Secondary | ICD-10-CM | POA: Diagnosis not present

## 2016-03-11 DIAGNOSIS — H01021 Squamous blepharitis right upper eyelid: Secondary | ICD-10-CM | POA: Diagnosis not present

## 2016-03-11 DIAGNOSIS — H01024 Squamous blepharitis left upper eyelid: Secondary | ICD-10-CM | POA: Diagnosis not present

## 2016-03-11 DIAGNOSIS — H01025 Squamous blepharitis left lower eyelid: Secondary | ICD-10-CM | POA: Diagnosis not present

## 2016-03-11 DIAGNOSIS — B0052 Herpesviral keratitis: Secondary | ICD-10-CM | POA: Diagnosis not present

## 2016-03-11 DIAGNOSIS — H2511 Age-related nuclear cataract, right eye: Secondary | ICD-10-CM | POA: Diagnosis not present

## 2016-03-18 DIAGNOSIS — Z961 Presence of intraocular lens: Secondary | ICD-10-CM | POA: Diagnosis not present

## 2016-03-18 DIAGNOSIS — H01024 Squamous blepharitis left upper eyelid: Secondary | ICD-10-CM | POA: Diagnosis not present

## 2016-03-18 DIAGNOSIS — H2511 Age-related nuclear cataract, right eye: Secondary | ICD-10-CM | POA: Diagnosis not present

## 2016-03-18 DIAGNOSIS — B0052 Herpesviral keratitis: Secondary | ICD-10-CM | POA: Diagnosis not present

## 2016-03-18 DIAGNOSIS — H01025 Squamous blepharitis left lower eyelid: Secondary | ICD-10-CM | POA: Diagnosis not present

## 2016-03-18 DIAGNOSIS — H01022 Squamous blepharitis right lower eyelid: Secondary | ICD-10-CM | POA: Diagnosis not present

## 2016-03-18 DIAGNOSIS — H01021 Squamous blepharitis right upper eyelid: Secondary | ICD-10-CM | POA: Diagnosis not present

## 2016-03-20 DIAGNOSIS — H01025 Squamous blepharitis left lower eyelid: Secondary | ICD-10-CM | POA: Diagnosis not present

## 2016-03-20 DIAGNOSIS — H01022 Squamous blepharitis right lower eyelid: Secondary | ICD-10-CM | POA: Diagnosis not present

## 2016-03-20 DIAGNOSIS — H16232 Neurotrophic keratoconjunctivitis, left eye: Secondary | ICD-10-CM | POA: Diagnosis not present

## 2016-03-20 DIAGNOSIS — B0052 Herpesviral keratitis: Secondary | ICD-10-CM | POA: Diagnosis not present

## 2016-03-20 DIAGNOSIS — H01021 Squamous blepharitis right upper eyelid: Secondary | ICD-10-CM | POA: Diagnosis not present

## 2016-03-20 DIAGNOSIS — H01024 Squamous blepharitis left upper eyelid: Secondary | ICD-10-CM | POA: Diagnosis not present

## 2016-03-27 DIAGNOSIS — Z961 Presence of intraocular lens: Secondary | ICD-10-CM | POA: Diagnosis not present

## 2016-03-27 DIAGNOSIS — H04221 Epiphora due to insufficient drainage, right lacrimal gland: Secondary | ICD-10-CM | POA: Diagnosis not present

## 2016-03-27 DIAGNOSIS — H2511 Age-related nuclear cataract, right eye: Secondary | ICD-10-CM | POA: Diagnosis not present

## 2016-03-27 DIAGNOSIS — H02052 Trichiasis without entropian right lower eyelid: Secondary | ICD-10-CM | POA: Diagnosis not present

## 2016-03-27 DIAGNOSIS — B0052 Herpesviral keratitis: Secondary | ICD-10-CM | POA: Diagnosis not present

## 2016-03-28 DIAGNOSIS — R1310 Dysphagia, unspecified: Secondary | ICD-10-CM | POA: Diagnosis not present

## 2016-03-28 DIAGNOSIS — E119 Type 2 diabetes mellitus without complications: Secondary | ICD-10-CM | POA: Diagnosis not present

## 2016-03-28 DIAGNOSIS — B0052 Herpesviral keratitis: Secondary | ICD-10-CM | POA: Diagnosis not present

## 2016-03-28 DIAGNOSIS — Z1389 Encounter for screening for other disorder: Secondary | ICD-10-CM | POA: Diagnosis not present

## 2016-03-28 DIAGNOSIS — E611 Iron deficiency: Secondary | ICD-10-CM | POA: Diagnosis not present

## 2016-03-28 DIAGNOSIS — I10 Essential (primary) hypertension: Secondary | ICD-10-CM | POA: Diagnosis not present

## 2016-03-28 DIAGNOSIS — J449 Chronic obstructive pulmonary disease, unspecified: Secondary | ICD-10-CM | POA: Diagnosis not present

## 2016-03-28 DIAGNOSIS — M1990 Unspecified osteoarthritis, unspecified site: Secondary | ICD-10-CM | POA: Diagnosis not present

## 2016-03-28 DIAGNOSIS — E782 Mixed hyperlipidemia: Secondary | ICD-10-CM | POA: Diagnosis not present

## 2016-03-28 DIAGNOSIS — Z0000 Encounter for general adult medical examination without abnormal findings: Secondary | ICD-10-CM | POA: Diagnosis not present

## 2016-04-03 DIAGNOSIS — H2511 Age-related nuclear cataract, right eye: Secondary | ICD-10-CM | POA: Diagnosis not present

## 2016-04-03 DIAGNOSIS — B0052 Herpesviral keratitis: Secondary | ICD-10-CM | POA: Diagnosis not present

## 2016-04-03 DIAGNOSIS — Z961 Presence of intraocular lens: Secondary | ICD-10-CM | POA: Diagnosis not present

## 2016-04-10 ENCOUNTER — Other Ambulatory Visit: Payer: Self-pay | Admitting: Gastroenterology

## 2016-04-10 DIAGNOSIS — H04123 Dry eye syndrome of bilateral lacrimal glands: Secondary | ICD-10-CM | POA: Diagnosis not present

## 2016-04-10 DIAGNOSIS — H2511 Age-related nuclear cataract, right eye: Secondary | ICD-10-CM | POA: Diagnosis not present

## 2016-04-10 DIAGNOSIS — B0052 Herpesviral keratitis: Secondary | ICD-10-CM | POA: Diagnosis not present

## 2016-04-10 DIAGNOSIS — R1310 Dysphagia, unspecified: Secondary | ICD-10-CM

## 2016-04-10 DIAGNOSIS — Z961 Presence of intraocular lens: Secondary | ICD-10-CM | POA: Diagnosis not present

## 2016-04-22 ENCOUNTER — Ambulatory Visit
Admission: RE | Admit: 2016-04-22 | Discharge: 2016-04-22 | Disposition: A | Discharge status: Home / Self Care | Payer: Medicare Other | Source: Ambulatory Visit | Attending: Gastroenterology | Admitting: Gastroenterology

## 2016-04-22 DIAGNOSIS — R1310 Dysphagia, unspecified: Secondary | ICD-10-CM | POA: Diagnosis not present

## 2016-04-22 DIAGNOSIS — K224 Dyskinesia of esophagus: Secondary | ICD-10-CM | POA: Diagnosis not present

## 2016-05-05 DIAGNOSIS — Z1211 Encounter for screening for malignant neoplasm of colon: Secondary | ICD-10-CM | POA: Diagnosis not present

## 2016-05-05 DIAGNOSIS — Z1212 Encounter for screening for malignant neoplasm of rectum: Secondary | ICD-10-CM | POA: Diagnosis not present

## 2016-05-06 ENCOUNTER — Encounter (HOSPITAL_COMMUNITY): Payer: Self-pay | Admitting: Emergency Medicine

## 2016-05-06 DIAGNOSIS — E119 Type 2 diabetes mellitus without complications: Secondary | ICD-10-CM | POA: Insufficient documentation

## 2016-05-06 DIAGNOSIS — J449 Chronic obstructive pulmonary disease, unspecified: Secondary | ICD-10-CM | POA: Diagnosis not present

## 2016-05-06 DIAGNOSIS — Z7984 Long term (current) use of oral hypoglycemic drugs: Secondary | ICD-10-CM | POA: Diagnosis not present

## 2016-05-06 DIAGNOSIS — N201 Calculus of ureter: Secondary | ICD-10-CM | POA: Diagnosis not present

## 2016-05-06 DIAGNOSIS — I10 Essential (primary) hypertension: Secondary | ICD-10-CM | POA: Insufficient documentation

## 2016-05-06 DIAGNOSIS — R109 Unspecified abdominal pain: Secondary | ICD-10-CM | POA: Diagnosis present

## 2016-05-06 DIAGNOSIS — Z79899 Other long term (current) drug therapy: Secondary | ICD-10-CM | POA: Diagnosis not present

## 2016-05-06 DIAGNOSIS — Z7951 Long term (current) use of inhaled steroids: Secondary | ICD-10-CM | POA: Diagnosis not present

## 2016-05-06 DIAGNOSIS — Z87891 Personal history of nicotine dependence: Secondary | ICD-10-CM | POA: Insufficient documentation

## 2016-05-06 DIAGNOSIS — N132 Hydronephrosis with renal and ureteral calculous obstruction: Secondary | ICD-10-CM | POA: Diagnosis not present

## 2016-05-06 LAB — URINALYSIS, ROUTINE W REFLEX MICROSCOPIC
Bilirubin Urine: NEGATIVE
Glucose, UA: NEGATIVE mg/dL
Ketones, ur: NEGATIVE mg/dL
Leukocytes, UA: NEGATIVE
Nitrite: NEGATIVE
Protein, ur: NEGATIVE mg/dL
Specific Gravity, Urine: 1.028 (ref 1.005–1.030)
pH: 5 (ref 5.0–8.0)

## 2016-05-06 LAB — URINE MICROSCOPIC-ADD ON

## 2016-05-06 NOTE — ED Triage Notes (Signed)
Pt is c/o left flank pain that started about 230 this afternoon and has progressively gotten worse    Pt has nausea without vomiting  Pt is also c/o difficulty urinating  Pt states she has been dribbling urine but not able to empty her bladder  Pt states she has had diarrhea today but took immodium for that and has not had any more

## 2016-05-06 NOTE — ED Notes (Signed)
Patient's bladder was scanned and showed 48ml of urine in bladder.

## 2016-05-07 ENCOUNTER — Emergency Department (HOSPITAL_COMMUNITY): Payer: Medicare Other

## 2016-05-07 ENCOUNTER — Emergency Department (HOSPITAL_COMMUNITY)
Admission: EM | Admit: 2016-05-07 | Discharge: 2016-05-07 | Disposition: A | Discharge status: Home / Self Care | Payer: Medicare Other | Attending: Emergency Medicine | Admitting: Emergency Medicine

## 2016-05-07 DIAGNOSIS — N201 Calculus of ureter: Secondary | ICD-10-CM

## 2016-05-07 DIAGNOSIS — D500 Iron deficiency anemia secondary to blood loss (chronic): Secondary | ICD-10-CM

## 2016-05-07 DIAGNOSIS — N132 Hydronephrosis with renal and ureteral calculous obstruction: Secondary | ICD-10-CM | POA: Diagnosis not present

## 2016-05-07 LAB — COMPREHENSIVE METABOLIC PANEL
ALT: 9 U/L — ABNORMAL LOW (ref 14–54)
AST: 18 U/L (ref 15–41)
Albumin: 4.1 g/dL (ref 3.5–5.0)
Alkaline Phosphatase: 81 U/L (ref 38–126)
Anion gap: 12 (ref 5–15)
BUN: 22 mg/dL — ABNORMAL HIGH (ref 6–20)
CO2: 23 mmol/L (ref 22–32)
Calcium: 9.2 mg/dL (ref 8.9–10.3)
Chloride: 101 mmol/L (ref 101–111)
Creatinine, Ser: 1.06 mg/dL — ABNORMAL HIGH (ref 0.44–1.00)
GFR calc Af Amer: 59 mL/min — ABNORMAL LOW (ref >60)
GFR calc non Af Amer: 51 mL/min — ABNORMAL LOW (ref >60)
Glucose, Bld: 149 mg/dL — ABNORMAL HIGH (ref 65–99)
Potassium: 3.8 mmol/L (ref 3.5–5.1)
Sodium: 136 mmol/L (ref 135–145)
Total Bilirubin: 0.8 mg/dL (ref 0.3–1.2)
Total Protein: 7.4 g/dL (ref 6.5–8.1)

## 2016-05-07 LAB — CBC WITH DIFFERENTIAL/PLATELET
Basophils Absolute: 0 10*3/uL (ref 0.0–0.1)
Basophils Relative: 0 %
Eosinophils Absolute: 0.1 10*3/uL (ref 0.0–0.7)
Eosinophils Relative: 1 %
HCT: 40.6 % (ref 36.0–46.0)
Hemoglobin: 12.7 g/dL (ref 12.0–15.0)
Lymphocytes Relative: 8 %
Lymphs Abs: 1.5 10*3/uL (ref 0.7–4.0)
MCH: 28.5 pg (ref 26.0–34.0)
MCHC: 31.3 g/dL (ref 30.0–36.0)
MCV: 91.2 fL (ref 78.0–100.0)
Monocytes Absolute: 0.9 10*3/uL (ref 0.1–1.0)
Monocytes Relative: 5 %
Neutro Abs: 15.8 10*3/uL — ABNORMAL HIGH (ref 1.7–7.7)
Neutrophils Relative %: 86 %
Platelets: 242 10*3/uL (ref 150–400)
RBC: 4.45 MIL/uL (ref 3.87–5.11)
RDW: 18.8 % — ABNORMAL HIGH (ref 11.5–15.5)
WBC: 18.4 10*3/uL — ABNORMAL HIGH (ref 4.0–10.5)

## 2016-05-07 MED ORDER — HYDROCODONE-ACETAMINOPHEN 5-325 MG PO TABS: 1.0000 | ORAL_TABLET | Freq: Four times a day (QID) | ORAL | 0 refills | Status: DC | PRN

## 2016-05-07 MED ORDER — ONDANSETRON HCL 4 MG/2ML IJ SOLN
4.0000 mg | Freq: Once | INTRAMUSCULAR | Status: AC
  Administered 2016-05-07: 4 mg via INTRAVENOUS
  Filled 2016-05-07: qty 2

## 2016-05-07 MED ORDER — FENTANYL CITRATE (PF) 100 MCG/2ML IJ SOLN
50.0000 ug | Freq: Once | INTRAMUSCULAR | Status: AC
  Administered 2016-05-07: 50 ug via INTRAVENOUS
  Filled 2016-05-07: qty 2

## 2016-05-07 MED ORDER — ONDANSETRON HCL 4 MG PO TABS: 4.0000 mg | ORAL_TABLET | Freq: Three times a day (TID) | ORAL | 0 refills | Status: DC | PRN

## 2016-05-07 NOTE — Discharge Instructions (Signed)
Drink plenty of fluids. Take the medications as prescribed. Return to the ED if you get fever or have uncontrolled vomiting or pain. You need to follow up with Dr Lysle Rubens about the area on your adrenal gland. You can follow up with Dr Risa Grill, the urologist on call, for your kidney stone.

## 2016-05-07 NOTE — ED Provider Notes (Signed)
[facility] DEPT Provider Note   CSN: [id number] Arrival date & time: 05/06/16  2147 By signing my name below, I, Dyke Brackett, attest that this documentation has been prepared under the direction and in the presence of Rolland Porter, MD . Electronically Signed: Dyke Brackett, Scribe. 05/07/2016. 2:38 AM.   Time seen 02:19 AM  History   Chief Complaint Chief Complaint  Patient presents with  . Flank Pain    HPI Christina Bennett is a 75 y.o. female with hx of COPD, diabetes, dyslipidemia, and GERD who presents to the Emergency Department complaining of constant, sharp left lower abdominal pain radiating into her left flank onset today at 3 pm. Pt states the pain makes it difficult to sit still. No modifying or alleviating factors noted. She has never had this problem before.She notes assocaited difficulty urinating, nausea,  chills, and diarrhea 5x which began yesterday morning. Pt states she has diarrhea often. She denies tobacco use and rarely drinks caffeine. She is not on oxygen at home. Pt denies fever, dysuria,hematuria, vomiting and hematochezia. No Family history of renal stones. She denies hx of significant milk or caffeine ingestion.    The history is provided by the patient. No language interpreter was used.   Past Medical History:  Diagnosis Date  . Anemia   . Atypical chest pain    Atypical CP--stress test, cor angio, and CT chest negative in 2005. Nuclear study normal in 2011   . COPD (chronic obstructive pulmonary disease) ([place])   . Diabetes ([place])    Diabetes type 2- 1/13- metformin started  . Dyslipidemia   . Endometriosis    with ovarian radiation in 1966, gyn Dr cousins  . GERD (gastroesophageal reflux disease)    H/H  . Herpes    herpes of the left eye, Dr Patrice Paradise optho  . History of DVT (deep vein thrombosis)    on OCP  . Hypertension   . Mild anemia    WBC mild high, lab 2012/ HEMATOLOGY   . Osteoarthritis    Osteoarthritis of the hip, knee, and hand,  vicodin , prn  . Osteopenia    BD in 2013  . Tobacco use    quit 03/2008  . Vitamin D deficiency     Patient Active Problem List   Diagnosis Date Noted  . Coronary artery disease involving native heart 10/04/2014  . Chest pain 10/04/2014  . Type II diabetes mellitus with complication ([place]) [id number]  . Hyperlipidemia 10/04/2014  . Anemia 07/19/2014  . Iron deficiency anemia 10/21/2012    History reviewed. No pertinent surgical history.  OB History    No data available     Home Medications    Prior to Admission medications   Medication Sig Start Date End Date Taking? Authorizing Provider  albuterol (PROVENTIL HFA;VENTOLIN HFA) 108 (90 BASE) MCG/ACT inhaler Inhale 2 puffs into the lungs every 4 (four) hours as needed.   Yes Historical Provider, MD  fluticasone (FLONASE) 50 MCG/ACT nasal spray as directed. 08/24/14  Yes Historical Provider, MD  loratadine (CLARITIN) 10 MG tablet Take 10 mg by mouth 2 (two) times daily.   Yes Historical Provider, MD  losartan-hydrochlorothiazide (HYZAAR) 50-12.5 MG per tablet Take 1 tablet by mouth daily.  04/12/14  Yes Historical Provider, MD  metFORMIN (GLUCOPHAGE) 500 MG tablet Take 500 mg by mouth 2 (two) times daily with a meal.   Yes Historical Provider, MD  montelukast (SINGULAIR) 10 MG tablet Take 10 mg by mouth every evening.  01/31/16  Yes Historical Provider, MD  omeprazole (PRILOSEC) 10 MG capsule Take 20 mg by mouth daily.    Yes Historical Provider, MD  prednisoLONE acetate (PRED FORTE) 1 % ophthalmic suspension Place 1 drop into the left eye 3 (three) times daily.  05/24/14  Yes Historical Provider, MD  simvastatin (ZOCOR) 40 MG tablet Take 40 mg by mouth every evening.   Yes Historical Provider, MD  SYMBICORT 160-4.5 MCG/ACT inhaler Inhale 2 puffs into the lungs 2 (two) times daily.  06/13/14  Yes Historical Provider, MD  valACYclovir (VALTREX) 500 MG tablet Take 500 mg by mouth 2 (two) times daily.  01/31/16  Yes Historical Provider, MD    Vitamin D, Ergocalciferol, (DRISDOL) 50000 UNITS CAPS Take 50,000 Units by mouth every 14 (fourteen) days.   Yes Historical Provider, MD  HYDROcodone-acetaminophen (NORCO/VICODIN) 5-325 MG tablet Take 1-2 tablets by mouth every 6 (six) hours as needed for moderate pain. 05/07/16   Rolland Porter, MD  ondansetron (ZOFRAN) 4 MG tablet Take 1 tablet (4 mg total) by mouth every 8 (eight) hours as needed for nausea or vomiting. 05/07/16   Rolland Porter, MD    Family History Family History  Problem Relation Age of Onset  . Hypertension Mother   . Heart disease Brother   . Heart disease Brother     Social History Social History  Substance Use Topics  . Smoking status: Former Research scientist (life sciences)  . Smokeless tobacco: Never Used  . Alcohol use No   Lives with sister  Allergies   Bee venom; Ibuprofen; Percocet [oxycodone-acetaminophen]; and Aspirin   Review of Systems Review of Systems  All other systems reviewed and are negative.  10 systems reviewed and all are negative for acute change except as noted in the HPI.  Physical Exam Updated Vital Signs BP 174/81 (BP Location: Left Arm)   Pulse 96   Temp 98.2 F (36.8 C) (Oral)   Resp 18   Ht 5\' 3"  (1.6 m)   Wt 185 lb (83.9 kg)   SpO2 94%   BMI 32.77 kg/m   Vital signs normal    Physical Exam  Constitutional: She is oriented to person, place, and time. She appears well-developed and well-nourished.  Non-toxic appearance. She does not appear ill. No distress.  HENT:  Head: Normocephalic and atraumatic.  Right Ear: External ear normal.  Left Ear: External ear normal.  Nose: Nose normal. No mucosal edema or rhinorrhea.  Mouth/Throat: Oropharynx is clear and moist and mucous membranes are normal. No dental abscesses or uvula swelling.  Eyes: Conjunctivae and EOM are normal. Pupils are equal, round, and reactive to light.  Neck: Normal range of motion and full passive range of motion without pain. Neck supple.  Cardiovascular: Normal rate, regular  rhythm and normal heart sounds.  Exam reveals no gallop and no friction rub.   No murmur heard. Pulmonary/Chest: Effort normal and breath sounds normal. No respiratory distress. She has no wheezes. She has no rhonchi. She has no rales. She exhibits no tenderness and no crepitus.  Abdominal: Soft. Normal appearance and bowel sounds are normal. She exhibits no distension. There is tenderness. There is no rebound and no guarding.    Tender in left abdomen diffusely  Genitourinary:  Genitourinary Comments: Tender left flank  Musculoskeletal: Normal range of motion. She exhibits no edema or tenderness.  Neurological: She is alert and oriented to person, place, and time. She has normal strength. No cranial nerve deficit.  Skin: Skin is warm, dry and intact. No rash  noted. No erythema. No pallor.  Psychiatric: She has a normal mood and affect. Her speech is normal and behavior is normal. Her mood appears not anxious.  Nursing note and vitals reviewed.  ED Treatments / Results  Labs (all labs ordered are listed, but only abnormal results are displayed) Results for orders placed or performed during the hospital encounter of 05/07/16  Urinalysis, Routine w reflex microscopic- may I&O cath if menses  Result Value Ref Range   Color, Urine YELLOW YELLOW   APPearance CLOUDY (A) CLEAR   Specific Gravity, Urine 1.028 1.005 - 1.030   pH 5.0 5.0 - 8.0   Glucose, UA NEGATIVE NEGATIVE mg/dL   Hgb urine dipstick SMALL (A) NEGATIVE   Bilirubin Urine NEGATIVE NEGATIVE   Ketones, ur NEGATIVE NEGATIVE mg/dL   Protein, ur NEGATIVE NEGATIVE mg/dL   Nitrite NEGATIVE NEGATIVE   Leukocytes, UA NEGATIVE NEGATIVE  Urine microscopic-add on  Result Value Ref Range   Squamous Epithelial / LPF 0-5 (A) NONE SEEN   WBC, UA 0-5 0 - 5 WBC/hpf   RBC / HPF 0-5 0 - 5 RBC/hpf   Bacteria, UA FEW (A) NONE SEEN   Urine-Other LESS THAN 10 mL OF URINE SUBMITTED   Comprehensive metabolic panel  Result Value Ref Range    Sodium 136 135 - 145 mmol/L   Potassium 3.8 3.5 - 5.1 mmol/L   Chloride 101 101 - 111 mmol/L   CO2 23 22 - 32 mmol/L   Glucose, Bld 149 (H) 65 - 99 mg/dL   BUN 22 (H) 6 - 20 mg/dL   Creatinine, Ser 1.06 (H) 0.44 - 1.00 mg/dL   Calcium 9.2 8.9 - 10.3 mg/dL   Total Protein 7.4 6.5 - 8.1 g/dL   Albumin 4.1 3.5 - 5.0 g/dL   AST 18 15 - 41 U/L   ALT 9 (L) 14 - 54 U/L   Alkaline Phosphatase 81 38 - 126 U/L   Total Bilirubin 0.8 0.3 - 1.2 mg/dL   GFR calc non Af Amer 51 (L) >60 mL/min   GFR calc Af Amer 59 (L) >60 mL/min   Anion gap 12 5 - 15  CBC with Differential  Result Value Ref Range   WBC 18.4 (H) 4.0 - 10.5 K/uL   RBC 4.45 3.87 - 5.11 MIL/uL   Hemoglobin 12.7 12.0 - 15.0 g/dL   HCT 40.6 36.0 - 46.0 %   MCV 91.2 78.0 - 100.0 fL   MCH 28.5 26.0 - 34.0 pg   MCHC 31.3 30.0 - 36.0 g/dL   RDW 18.8 (H) 11.5 - 15.5 %   Platelets 242 150 - 400 K/uL   Neutrophils Relative % 86 %   Neutro Abs 15.8 (H) 1.7 - 7.7 K/uL   Lymphocytes Relative 8 %   Lymphs Abs 1.5 0.7 - 4.0 K/uL   Monocytes Relative 5 %   Monocytes Absolute 0.9 0.1 - 1.0 K/uL   Eosinophils Relative 1 %   Eosinophils Absolute 0.1 0.0 - 0.7 K/uL   Basophils Relative 0 %   Basophils Absolute 0.0 0.0 - 0.1 K/uL   Laboratory interpretation all normal except persistent leukocytosis, new renal insufficiency    EKG  EKG Interpretation None       Radiology Ct Renal Stone Study  Result Date: 05/07/2016 CLINICAL DATA:  Acute onset of left flank pain.  Initial encounter. EXAM: CT ABDOMEN AND PELVIS WITHOUT CONTRAST TECHNIQUE: Multidetector CT imaging of the abdomen and pelvis was performed following the standard protocol without  IV contrast. COMPARISON:  CT of the abdomen and pelvis performed 04/22/2005 FINDINGS: Lower chest: Mild bibasilar atelectasis or scarring is noted. Mild coronary artery calcification is noted. Hepatobiliary: The liver is unremarkable in appearance. The gallbladder is unremarkable in appearance. The  common bile duct remains normal in caliber. Pancreas: The pancreas is within normal limits. Spleen: The spleen is unremarkable in appearance. 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 This is a very slow rate of growth over the past 11 years from 1.7 cm, and is likely benign, though would correlate with adrenal labs. The right adrenal gland is unremarkable in appearance. Minimal left-sided hydronephrosis is seen, with left-sided perinephric stranding. A small obstructing 3 mm stone is noted distally at the left vesicoureteral junction, along the base of the bladder. No nonobstructing renal stones are identified. The right kidney is unremarkable in appearance. Stomach/Bowel: The stomach is unremarkable in appearance. The small bowel is within normal limits. The appendix is not visualized; there is no evidence for appendicitis. The colon is unremarkable in appearance. Vascular/Lymphatic: Scattered calcification is seen along the abdominal aorta and its branches. Mild calcification is seen along the superior mesenteric artery. The inferior vena cava is grossly unremarkable. No retroperitoneal lymphadenopathy is seen. No pelvic sidewall lymphadenopathy is identified. Reproductive: The bladder is largely decompressed and otherwise grossly unremarkable. The uterus is grossly unremarkable in appearance. No suspicious adnexal masses are seen. Other: No additional soft tissue abnormalities are seen. Postoperative change is noted at the right lower quadrant. Musculoskeletal: No acute osseous abnormalities are identified. Mild vacuum phenomenon and disc space narrowing is noted at L4-L5. The visualized musculature is unremarkable in appearance. IMPRESSION: 1. Minimal left-sided hydronephrosis, with small obstructing 3 mm stone noted distally at the left vesicoureteral junction, along the base of the bladder. 2. **An incidental finding of potential clinical significance has been  found. 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 This is a very slow rate of growth, and likely reflects a benign lesion, though would correlate with adrenal abscess. ** 3. Mild bibasilar atelectasis or scarring noted. 4. Mild coronary artery calcifications seen. 5. Scattered aortic atherosclerosis noted. Mild calcification along the superior mesenteric artery. Electronically Signed   By: Garald Balding M.D.   On: 05/07/2016 03:28    Procedures Procedures (including critical care time)  Medications Ordered in ED Medications  fentaNYL (SUBLIMAZE) injection 50 mcg (50 mcg Intravenous Given 05/07/16 0334)  ondansetron (ZOFRAN) injection 4 mg (4 mg Intravenous Given 05/07/16 0331)  fentaNYL (SUBLIMAZE) injection 50 mcg (50 mcg Intravenous Given 05/07/16 0424)     Initial Impression / Assessment and Plan / ED Course  I have reviewed the triage vital signs and the nursing notes.  Pertinent labs & imaging results that were available during my care of the patient were reviewed by me and considered in my medical decision making (see chart for details).  Clinical Course  DIAGNOSTIC STUDIES:  Oxygen Saturation is 94% on RA, low by my interpretation.    COORDINATION OF CARE:  2:25 AM Will order fentanyl for pain and Discussed treatment plan with pt at bedside and pt agreed to plan. Renal CT done for suspicion of renal stone, she could also have diverticulitis which would also be visualized on CT  3:54 AM Discussed test results. We discussed the radiologist concern about the mass on her adrenal gland, she is going to follow up with Dr Lysle Rubens about that. Pt  is still having pain. She was given more fentanyl Waiting for CMET to result so pt can have Toradol.   Pt states her pain is improved and she is ready to be discharged home. She was not given toradol because of her new renal insufficiency.    Final Clinical Impressions(s) / ED Diagnoses   Final diagnoses:  Left  ureteral stone   New Prescriptions New Prescriptions   HYDROCODONE-ACETAMINOPHEN (NORCO/VICODIN) 5-325 MG TABLET    Take 1-2 tablets by mouth every 6 (six) hours as needed for moderate pain.   ONDANSETRON (ZOFRAN) 4 MG TABLET    Take 1 tablet (4 mg total) by mouth every 8 (eight) hours as needed for nausea or vomiting.    Plan discharge  Rolland Porter, MD, FACEP  I personally performed the services described in this documentation, which was scribed in my presence. The recorded information has been reviewed and considered.  Rolland Porter, MD, Barbette Or, MD 05/07/16 [id number]

## 2016-05-16 DIAGNOSIS — Z23 Encounter for immunization: Secondary | ICD-10-CM | POA: Diagnosis not present

## 2016-05-20 DIAGNOSIS — H04123 Dry eye syndrome of bilateral lacrimal glands: Secondary | ICD-10-CM | POA: Diagnosis not present

## 2016-05-20 DIAGNOSIS — Z961 Presence of intraocular lens: Secondary | ICD-10-CM | POA: Diagnosis not present

## 2016-05-20 DIAGNOSIS — H01022 Squamous blepharitis right lower eyelid: Secondary | ICD-10-CM | POA: Diagnosis not present

## 2016-05-20 DIAGNOSIS — H01021 Squamous blepharitis right upper eyelid: Secondary | ICD-10-CM | POA: Diagnosis not present

## 2016-05-20 DIAGNOSIS — H2511 Age-related nuclear cataract, right eye: Secondary | ICD-10-CM | POA: Diagnosis not present

## 2016-05-20 DIAGNOSIS — H01025 Squamous blepharitis left lower eyelid: Secondary | ICD-10-CM | POA: Diagnosis not present

## 2016-05-20 DIAGNOSIS — H01024 Squamous blepharitis left upper eyelid: Secondary | ICD-10-CM | POA: Diagnosis not present

## 2016-05-20 DIAGNOSIS — B0052 Herpesviral keratitis: Secondary | ICD-10-CM | POA: Diagnosis not present

## 2016-06-03 ENCOUNTER — Other Ambulatory Visit: Payer: Self-pay | Admitting: Gastroenterology

## 2016-06-07 ENCOUNTER — Other Ambulatory Visit (HOSPITAL_BASED_OUTPATIENT_CLINIC_OR_DEPARTMENT_OTHER): Payer: Medicare Other

## 2016-06-07 ENCOUNTER — Telehealth: Payer: Self-pay | Admitting: Oncology

## 2016-06-07 ENCOUNTER — Ambulatory Visit (HOSPITAL_BASED_OUTPATIENT_CLINIC_OR_DEPARTMENT_OTHER): Payer: Medicare Other | Admitting: Oncology

## 2016-06-07 VITALS — BP 148/82 | HR 88 | Temp 98.4°F | Resp 18 | Ht 63.0 in | Wt 195.3 lb

## 2016-06-07 DIAGNOSIS — D72829 Elevated white blood cell count, unspecified: Secondary | ICD-10-CM | POA: Diagnosis not present

## 2016-06-07 DIAGNOSIS — D509 Iron deficiency anemia, unspecified: Secondary | ICD-10-CM

## 2016-06-07 LAB — CBC WITH DIFFERENTIAL/PLATELET
BASO%: 0.5 % (ref 0.0–2.0)
Basophils Absolute: 0.1 10*3/uL (ref 0.0–0.1)
EOS%: 5.7 % (ref 0.0–7.0)
Eosinophils Absolute: 0.8 10*3/uL — ABNORMAL HIGH (ref 0.0–0.5)
HCT: 39.5 % (ref 34.8–46.6)
HGB: 12.5 g/dL (ref 11.6–15.9)
LYMPH%: 15.8 % (ref 14.0–49.7)
MCH: 28.6 pg (ref 25.1–34.0)
MCHC: 31.7 g/dL (ref 31.5–36.0)
MCV: 90.2 fL (ref 79.5–101.0)
MONO#: 0.9 10*3/uL (ref 0.1–0.9)
MONO%: 6.5 % (ref 0.0–14.0)
NEUT#: 10 10*3/uL — ABNORMAL HIGH (ref 1.5–6.5)
NEUT%: 71.5 % (ref 38.4–76.8)
Platelets: 251 10*3/uL (ref 145–400)
RBC: 4.37 10*6/uL (ref 3.70–5.45)
RDW: 15.4 % — ABNORMAL HIGH (ref 11.2–14.5)
WBC: 14 10*3/uL — ABNORMAL HIGH (ref 3.9–10.3)
lymph#: 2.2 10*3/uL (ref 0.9–3.3)

## 2016-06-07 LAB — IRON AND TIBC
%SAT: 11 % — ABNORMAL LOW (ref 21–57)
Iron: 36 ug/dL — ABNORMAL LOW (ref 41–142)
TIBC: 314 ug/dL (ref 236–444)
UIBC: 278 ug/dL (ref 120–384)

## 2016-06-07 LAB — FERRITIN: Ferritin: 61 ng/ml (ref 9–269)

## 2016-06-07 NOTE — Progress Notes (Signed)
Hematology and Oncology Follow Up Visit  CAIDENCE DEAS [id number] April 08, 1941 75 y.o. 06/07/2016 11:29 AM   Principle Diagnosis: 89- year old with iron deficiency anemia diagnosed in 04/2012. Her GI work up did not show a clear cut source of bleeding.  Prior therapy: IV iron in the form of Feraheme of total 1000 mg given in January 2016.  Current therapy: Observation and surveillance.  Interim History: [name] presents today for a follow up visit. Since her last visit, she reports no major changes in her health. She did have colon cancer screening test by DNA which was positive and she is scheduled to have a colonoscopy in the next few weeks. She denied any hematochezia, melena or decrease in her performance status. Does not report any GU bleeding. She had been intolerant to oral iron in the past. She continues to perform activities of daily living without any decline. Her IV iron and was last given in June 2017 and have tolerated it well. Her last colonoscopy was in  2012.  He does not report any headaches or blurred vision or double vision. She does not report any syncope or seizures. She did not report any chest pain or shortness of breath. She does not report any hemoptysis. She does not report any nausea, vomiting or abdominal pain. Is not reporting any frequency urgency or hesitancy. Her remaining review of systems unremarkable.    Medications: I have reviewed the patient's current medications.  Current Outpatient Prescriptions  Medication Sig Dispense Refill  . albuterol (PROVENTIL HFA;VENTOLIN HFA) 108 (90 BASE) MCG/ACT inhaler Inhale 2 puffs into the lungs every 4 (four) hours as needed.    . fluticasone (FLONASE) 50 MCG/ACT nasal spray as directed.    Marland Kitchen HYDROcodone-acetaminophen (NORCO/VICODIN) 5-325 MG tablet Take 1-2 tablets by mouth every 6 (six) hours as needed for moderate pain. 25 tablet 0  . loratadine (CLARITIN) 10 MG tablet Take 10 mg by mouth 2 (two) times daily.    Marland Kitchen  losartan-hydrochlorothiazide (HYZAAR) 50-12.5 MG per tablet Take 1 tablet by mouth daily.   2  . metFORMIN (GLUCOPHAGE) 500 MG tablet Take 500 mg by mouth 2 (two) times daily with a meal.    . montelukast (SINGULAIR) 10 MG tablet Take 10 mg by mouth every evening.   2  . omeprazole (PRILOSEC) 10 MG capsule Take 20 mg by mouth daily.     . ondansetron (ZOFRAN) 4 MG tablet Take 1 tablet (4 mg total) by mouth every 8 (eight) hours as needed for nausea or vomiting. 10 tablet 0  . prednisoLONE acetate (PRED FORTE) 1 % ophthalmic suspension Place 1 drop into the left eye 3 (three) times daily.   5  . simvastatin (ZOCOR) 40 MG tablet Take 40 mg by mouth every evening.    . SYMBICORT 160-4.5 MCG/ACT inhaler Inhale 2 puffs into the lungs 2 (two) times daily.   4  . valACYclovir (VALTREX) 500 MG tablet Take 500 mg by mouth 2 (two) times daily.   0  . Vitamin D, Ergocalciferol, (DRISDOL) 50000 UNITS CAPS Take 50,000 Units by mouth every 14 (fourteen) days.     No current facility-administered medications for this visit.     Allergies:  Allergies  Allergen Reactions  . Aspirin Swelling and Rash    Throat swells.  . Bee Venom Anaphylaxis  . Ibuprofen Shortness Of Breath and Swelling    Throat swells.  Richardo Hanks [Oxycodone-Acetaminophen] Itching    Past Medical History, Surgical history, Social history, and  Family History were reviewed and updated.    Physical Exam: Blood pressure (!) 148/82, pulse 88, temperature 98.4 F (36.9 C), temperature source Oral, resp. rate 18, height 5\' 3"  (1.6 m), weight 195 lb 4.8 oz (88.6 kg), SpO2 97 %. ECOG: 0 General appearance: Well-appearing woman without distress. Head: Normocephalic, without obvious abnormality No oral ulcers or lesions. Neck: no adenopathy Lymph nodes: Cervical, supraclavicular, and axillary nodes normal. Heart:regular rate and rhythm, S1, S2 normal, no murmur, click, rub or gallop Lung:chest clear, no wheezing, rales, normal symmetric  air entry Abdomen: soft, non-tender, without masses or organomegaly no shifting dullness or ascites. EXT:no erythema, induration, or nodules   Lab Results: Lab Results  Component Value Date   WBC 14.0 (H) 06/07/2016   HGB 12.5 06/07/2016   HCT 39.5 06/07/2016   MCV 90.2 06/07/2016   PLT 251 06/07/2016     Chemistry      Component Value Date/Time   NA 136 05/07/2016 0326   NA 141 05/02/2015 0952   K 3.8 05/07/2016 0326   K 4.2 05/02/2015 0952   CL 101 05/07/2016 0326   CL 104 04/23/2012 1017   CO2 23 05/07/2016 0326   CO2 28 05/02/2015 0952   BUN 22 (H) 05/07/2016 0326   BUN 13.0 05/02/2015 0952   CREATININE 1.06 (H) 05/07/2016 0326   CREATININE 0.7 05/02/2015 0952      Component Value Date/Time   CALCIUM 9.2 05/07/2016 0326   CALCIUM 9.4 05/02/2015 0952   ALKPHOS 81 05/07/2016 0326   ALKPHOS 82 05/02/2015 0952   AST 18 05/07/2016 0326   AST 12 05/02/2015 0952   ALT 9 (L) 05/07/2016 0326   ALT 7 05/02/2015 0952   BILITOT 0.8 05/07/2016 0326   BILITOT 0.38 05/02/2015 0952      Impression and Plan:  This is a pleasant 75 year old woman with the following issues:   1. 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 She is status post IV iron in January 2016 with normalization of her hemoglobin.   Her last IV iron infusion was June 2017 with normalization of her hemoglobin. Her repeat iron studies are currently pending. We will continue to check on her hemoglobin iron studies periodically and replace as needed.  2. Colon cancer screening: She will have a colonoscopy done in the near future.  3. Leukocytosis. This appears to be a reactive in nature. Her white cell count have actually improved since last visit.   4. Followup: Will be in 4 months to repeat laboratory testing.     Myli Pae 10/27/201711:29 AM

## 2016-06-07 NOTE — Telephone Encounter (Signed)
Follow up appointment and lab was scheduled for February, per 06/07/16 los. AVS report and Appointment schedule given to patient, per 06/07/16 los.

## 2016-06-13 ENCOUNTER — Other Ambulatory Visit: Payer: Self-pay | Admitting: Gastroenterology

## 2016-06-25 ENCOUNTER — Other Ambulatory Visit: Payer: Self-pay | Admitting: Internal Medicine

## 2016-06-25 DIAGNOSIS — Z1231 Encounter for screening mammogram for malignant neoplasm of breast: Secondary | ICD-10-CM

## 2016-06-27 ENCOUNTER — Encounter (HOSPITAL_COMMUNITY): Payer: Self-pay | Admitting: *Deleted

## 2016-07-03 DIAGNOSIS — H2511 Age-related nuclear cataract, right eye: Secondary | ICD-10-CM | POA: Diagnosis not present

## 2016-07-03 DIAGNOSIS — H01024 Squamous blepharitis left upper eyelid: Secondary | ICD-10-CM | POA: Diagnosis not present

## 2016-07-03 DIAGNOSIS — H01022 Squamous blepharitis right lower eyelid: Secondary | ICD-10-CM | POA: Diagnosis not present

## 2016-07-03 DIAGNOSIS — B0052 Herpesviral keratitis: Secondary | ICD-10-CM | POA: Diagnosis not present

## 2016-07-03 DIAGNOSIS — Z961 Presence of intraocular lens: Secondary | ICD-10-CM | POA: Diagnosis not present

## 2016-07-03 DIAGNOSIS — H01021 Squamous blepharitis right upper eyelid: Secondary | ICD-10-CM | POA: Diagnosis not present

## 2016-07-03 DIAGNOSIS — H01025 Squamous blepharitis left lower eyelid: Secondary | ICD-10-CM | POA: Diagnosis not present

## 2016-07-09 ENCOUNTER — Ambulatory Visit (HOSPITAL_COMMUNITY): Payer: Medicare Other | Admitting: Anesthesiology

## 2016-07-09 ENCOUNTER — Encounter (HOSPITAL_COMMUNITY)
Admission: RE | Disposition: A | Discharge status: Home / Self Care | Payer: Self-pay | Source: Ambulatory Visit | Attending: Gastroenterology

## 2016-07-09 ENCOUNTER — Encounter (HOSPITAL_COMMUNITY): Payer: Self-pay

## 2016-07-09 ENCOUNTER — Ambulatory Visit (HOSPITAL_COMMUNITY)
Admission: RE | Admit: 2016-07-09 | Discharge: 2016-07-09 | Disposition: A | Discharge status: Home / Self Care | Payer: Medicare Other | Source: Ambulatory Visit | Attending: Gastroenterology | Admitting: Gastroenterology

## 2016-07-09 DIAGNOSIS — K219 Gastro-esophageal reflux disease without esophagitis: Secondary | ICD-10-CM | POA: Insufficient documentation

## 2016-07-09 DIAGNOSIS — Z7984 Long term (current) use of oral hypoglycemic drugs: Secondary | ICD-10-CM | POA: Diagnosis not present

## 2016-07-09 DIAGNOSIS — D123 Benign neoplasm of transverse colon: Secondary | ICD-10-CM | POA: Insufficient documentation

## 2016-07-09 DIAGNOSIS — E119 Type 2 diabetes mellitus without complications: Secondary | ICD-10-CM | POA: Diagnosis not present

## 2016-07-09 DIAGNOSIS — E7800 Pure hypercholesterolemia, unspecified: Secondary | ICD-10-CM | POA: Diagnosis not present

## 2016-07-09 DIAGNOSIS — M1990 Unspecified osteoarthritis, unspecified site: Secondary | ICD-10-CM | POA: Diagnosis not present

## 2016-07-09 DIAGNOSIS — Z86718 Personal history of other venous thrombosis and embolism: Secondary | ICD-10-CM | POA: Insufficient documentation

## 2016-07-09 DIAGNOSIS — Z1211 Encounter for screening for malignant neoplasm of colon: Secondary | ICD-10-CM | POA: Insufficient documentation

## 2016-07-09 DIAGNOSIS — D125 Benign neoplasm of sigmoid colon: Secondary | ICD-10-CM | POA: Diagnosis not present

## 2016-07-09 DIAGNOSIS — Z6834 Body mass index (BMI) 34.0-34.9, adult: Secondary | ICD-10-CM | POA: Diagnosis not present

## 2016-07-09 DIAGNOSIS — R195 Other fecal abnormalities: Secondary | ICD-10-CM | POA: Diagnosis not present

## 2016-07-09 DIAGNOSIS — R1310 Dysphagia, unspecified: Secondary | ICD-10-CM | POA: Diagnosis not present

## 2016-07-09 DIAGNOSIS — Z886 Allergy status to analgesic agent status: Secondary | ICD-10-CM | POA: Insufficient documentation

## 2016-07-09 DIAGNOSIS — Z87891 Personal history of nicotine dependence: Secondary | ICD-10-CM | POA: Diagnosis not present

## 2016-07-09 DIAGNOSIS — D124 Benign neoplasm of descending colon: Secondary | ICD-10-CM | POA: Diagnosis not present

## 2016-07-09 DIAGNOSIS — K317 Polyp of stomach and duodenum: Secondary | ICD-10-CM | POA: Insufficient documentation

## 2016-07-09 DIAGNOSIS — I10 Essential (primary) hypertension: Secondary | ICD-10-CM | POA: Diagnosis not present

## 2016-07-09 DIAGNOSIS — Z885 Allergy status to narcotic agent status: Secondary | ICD-10-CM | POA: Diagnosis not present

## 2016-07-09 DIAGNOSIS — Z91030 Bee allergy status: Secondary | ICD-10-CM | POA: Insufficient documentation

## 2016-07-09 DIAGNOSIS — D122 Benign neoplasm of ascending colon: Secondary | ICD-10-CM | POA: Diagnosis not present

## 2016-07-09 DIAGNOSIS — Z923 Personal history of irradiation: Secondary | ICD-10-CM | POA: Diagnosis not present

## 2016-07-09 DIAGNOSIS — J449 Chronic obstructive pulmonary disease, unspecified: Secondary | ICD-10-CM | POA: Diagnosis not present

## 2016-07-09 HISTORY — PX: ESOPHAGOGASTRODUODENOSCOPY: SHX5428

## 2016-07-09 HISTORY — PX: COLONOSCOPY WITH PROPOFOL: SHX5780

## 2016-07-09 HISTORY — PX: BALLOON DILATION: SHX5330

## 2016-07-09 HISTORY — DX: Personal history of urinary calculi: Z87.442

## 2016-07-09 LAB — GLUCOSE, CAPILLARY
Glucose-Capillary: 117 mg/dL — ABNORMAL HIGH (ref 65–99)
Glucose-Capillary: 109 mg/dL — ABNORMAL HIGH (ref 65–99)

## 2016-07-09 SURGERY — ESOPHAGOGASTRODUODENOSCOPY (EGD) WITH PROPOFOL
Anesthesia: Monitor Anesthesia Care

## 2016-07-09 SURGERY — COLONOSCOPY WITH PROPOFOL
Anesthesia: Monitor Anesthesia Care

## 2016-07-09 MED ORDER — PROPOFOL 500 MG/50ML IV EMUL
INTRAVENOUS | Status: DC | PRN
  Administered 2016-07-09: 140 ug/kg/min via INTRAVENOUS

## 2016-07-09 MED ORDER — SODIUM CHLORIDE 0.9 % IV SOLN: INTRAVENOUS | Status: DC

## 2016-07-09 MED ORDER — PROPOFOL 10 MG/ML IV BOLUS
INTRAVENOUS | Status: AC
  Filled 2016-07-09: qty 40

## 2016-07-09 MED ORDER — LACTATED RINGERS IV SOLN
INTRAVENOUS | Status: DC | PRN
  Administered 2016-07-09: 07:00:00 via INTRAVENOUS

## 2016-07-09 MED ORDER — LIDOCAINE 2% (20 MG/ML) 5 ML SYRINGE
INTRAMUSCULAR | Status: DC | PRN
  Administered 2016-07-09: 100 mg via INTRAVENOUS

## 2016-07-09 MED ORDER — PROPOFOL 10 MG/ML IV BOLUS
INTRAVENOUS | Status: DC | PRN
  Administered 2016-07-09 (×5): 20 mg via INTRAVENOUS

## 2016-07-09 MED ORDER — LIDOCAINE 2% (20 MG/ML) 5 ML SYRINGE
INTRAMUSCULAR | Status: AC
  Filled 2016-07-09: qty 5

## 2016-07-09 MED ORDER — PROPOFOL 10 MG/ML IV BOLUS
INTRAVENOUS | Status: AC
  Filled 2016-07-09: qty 20

## 2016-07-09 SURGICAL SUPPLY — 21 items

## 2016-07-09 NOTE — Anesthesia Postprocedure Evaluation (Signed)
Anesthesia Post Note  Patient: Christina Bennett  Procedure(s) Performed: Procedure(s) (LRB): COLONOSCOPY WITH PROPOFOL (N/A) ESOPHAGOGASTRODUODENOSCOPY (EGD) (N/A) BALLOON DILATION (N/A)  Patient location during evaluation: Endoscopy Anesthesia Type: Regional Level of consciousness: awake and alert, oriented and patient cooperative Pain management: pain level controlled Vital Signs Assessment: post-procedure vital signs reviewed and stable Respiratory status: spontaneous breathing, nonlabored ventilation and respiratory function stable Cardiovascular status: blood pressure returned to baseline and stable Postop Assessment: no signs of nausea or vomiting Anesthetic complications: no    Last Vitals:  Vitals:   07/09/16 0656 07/09/16 0917  BP: (!) 138/49   Pulse: 89 78  Resp: 14 16  Temp: 36.7 C     Last Pain:  Vitals:   07/09/16 0656  TempSrc: Oral                 Christina Bennett,E. Tyreik Delahoussaye

## 2016-07-09 NOTE — Transfer of Care (Signed)
Immediate Anesthesia Transfer of Care Note  Patient: Christina Bennett  Procedure(s) Performed: Procedure(s): COLONOSCOPY WITH PROPOFOL (N/A) ESOPHAGOGASTRODUODENOSCOPY (EGD) (N/A) BALLOON DILATION (N/A)  Patient Location: Endoscopy Unit  Anesthesia Type:MAC  Level of Consciousness: awake, alert  and oriented  Airway & Oxygen Therapy: Patient Spontanous Breathing and Patient connected to face mask oxygen  Post-op Assessment: Report given to RN and Post -op Vital signs reviewed and stable  Post vital signs: Reviewed and stable  Last Vitals:  Vitals:   07/09/16 0656  BP: (!) 138/49  Pulse: 89  Resp: 14  Temp: 36.7 C    Last Pain:  Vitals:   07/09/16 0656  TempSrc: Oral         Complications: No apparent anesthesia complications

## 2016-07-09 NOTE — Op Note (Signed)
[facility] Patient Name: Christina Bennett Procedure Date: 07/09/2016 MRN: [id number] Attending MD: Garlan Fair , MD Date of Birth: 1941-08-08 CSN: [id number] Age: 75 Admit Type: Outpatient Procedure:                Upper GI endoscopy Indications:              Dysphagia Providers:                Garlan Fair, MD, Hilma Favors, RN, Despina Pole Tech, Technician, Danley Danker, CRNA Referring MD:              Medicines:                Propofol per Anesthesia Complications:            No immediate complications. Estimated Blood Loss:     Estimated blood loss was minimal. Procedure:                Pre-Anesthesia Assessment:                           - Prior to the procedure, a History and Physical                            was performed, and patient medications and                            allergies were reviewed. The patient's tolerance of                            previous anesthesia was also reviewed. The risks                            and benefits of the procedure and the sedation                            options and risks were discussed with the patient.                            All questions were answered, and informed consent                            was obtained. Prior Anticoagulants: The patient has                            taken no previous anticoagulant or antiplatelet                            agents. ASA Grade Assessment: III - A patient with                            severe systemic disease. After reviewing the risks  and benefits, the patient was deemed in                            satisfactory condition to undergo the procedure.                           After obtaining informed consent, the endoscope was                            passed under direct vision. Throughout the                            procedure, the patient's blood pressure, pulse, and   oxygen saturations were monitored continuously. The                            [id number] scope was introduced through the                            mouth, and advanced to the second part of duodenum.                            The upper GI endoscopy was accomplished without                            difficulty. The patient tolerated the procedure                            well. Scope In: Scope Out: Findings:      The Z-line was regular and was found 40 cm from the incisors.      The examined esophagus was normal. Biopsies were taken with a cold       forceps for histology to R/O EoE.      A single 7 mm sessile polyp with no bleeding and no stigmata of recent       bleeding was found in the cardia. The polyp was biopsied and then       removed with a cold snare. Resection was complete. The polyp was       biopsied but not retrieved.      The examined duodenum was normal. Impression:               - Z-line regular, 40 cm from the incisors.                           - Normal esophagus. Biopsied.                           - A single gastric polyp. Resected and retrieved.                           - Normal examined duodenum. Moderate Sedation:      N/A- Per Anesthesia Care Recommendation:           - Patient has a contact number available for  emergencies. The signs and symptoms of potential                            delayed complications were discussed with the                            patient. Return to normal activities tomorrow.                            Written discharge instructions were provided to the                            patient.                           - Return to primary care physician PRN.                           - Resume previous diet.                           - Continue present medications. Procedure Code(s):        --- Professional ---                           [phone], Esophagogastroduodenoscopy, flexible,                             transoral; with removal of tumor(s), polyp(s), or                            other lesion(s) by snare technique Diagnosis Code(s):        --- Professional ---                           K31.7, Polyp of stomach and duodenum                           R13.10, Dysphagia, unspecified CPT copyright 2016 American Medical Association. All rights reserved. The codes documented in this report are preliminary and upon coder review may  be revised to meet current compliance requirements. Earle Gell, MD Garlan Fair, MD 07/09/2016 9:19:44 AM This report has been signed electronically. Number of Addenda: 0

## 2016-07-09 NOTE — Anesthesia Preprocedure Evaluation (Addendum)
Anesthesia Evaluation  Patient identified by MRN, date of birth, ID band Patient awake    Reviewed: Allergy & Precautions, NPO status , Patient's Chart, lab work & pertinent test results  History of Anesthesia Complications Negative for: history of anesthetic complications  Airway Mallampati: II  TM Distance: >3 FB Neck ROM: Full    Dental  (+) Edentulous Upper, Missing, Dental Advisory Given   Pulmonary COPD,  COPD inhaler, former smoker (quit 2009),    breath sounds clear to auscultation       Cardiovascular hypertension, Pt. on medications (-) angina+ DVT   Rhythm:Regular Rate:Normal  '16 stress: normal   Neuro/Psych negative neurological ROS  negative psych ROS   GI/Hepatic Neg liver ROS, GERD  Medicated and Controlled,  Endo/Other  diabetes (glu 117), Oral Hypoglycemic AgentsMorbid obesity  Renal/GU negative Renal ROS     Musculoskeletal   Abdominal (+) + obese,   Peds  Hematology negative hematology ROS (+)   Anesthesia Other Findings   Reproductive/Obstetrics                            Anesthesia Physical Anesthesia Plan  ASA: III  Anesthesia Plan: MAC   Post-op Pain Management:    Induction:   Airway Management Planned: Natural Airway and Nasal Cannula  Additional Equipment:   Intra-op Plan:   Post-operative Plan:   Informed Consent: I have reviewed the patients History and Physical, chart, labs and discussed the procedure including the risks, benefits and alternatives for the proposed anesthesia with the patient or authorized representative who has indicated his/her understanding and acceptance.   Dental advisory given  Plan Discussed with: CRNA and Surgeon  Anesthesia Plan Comments: (Plan routine monitors, MAC)        Anesthesia Quick Evaluation

## 2016-07-09 NOTE — Op Note (Signed)
[facility] Patient Name: Christina Bennett Procedure Date: 07/09/2016 MRN: [id number] Attending MD: Garlan Fair , MD Date of Birth: 1941-04-26 CSN: [id number] Age: 75 Admit Type: Outpatient Procedure:                Colonoscopy Indications:              Screening for colorectal malignant neoplasm:                            positive cologuard stool based colon cancer                            screening test Providers:                Garlan Fair, MD, Hilma Favors, RN, Despina Pole Tech, Technician, Danley Danker, CRNA Referring MD:              Medicines:                Propofol per Anesthesia Complications:            No immediate complications. Estimated Blood Loss:     Estimated blood loss: none. Procedure:                Pre-Anesthesia Assessment:                           - Prior to the procedure, a History and Physical                            was performed, and patient medications and                            allergies were reviewed. The patient's tolerance of                            previous anesthesia was also reviewed. The risks                            and benefits of the procedure and the sedation                            options and risks were discussed with the patient.                            All questions were answered, and informed consent                            was obtained. Prior Anticoagulants: The patient has                            taken no previous anticoagulant or antiplatelet                            agents. ASA  Grade Assessment: III - A patient with                            severe systemic disease. After reviewing the risks                            and benefits, the patient was deemed in                            satisfactory condition to undergo the procedure.                           After obtaining informed consent, the colonoscope                            was passed  under direct vision. Throughout the                            procedure, the patient's blood pressure, pulse, and                            oxygen saturations were monitored continuously. The                            [id number]) scope was introduced through                            the anus and advanced to the the cecum, identified                            by appendiceal orifice and ileocecal valve. The                            colonoscopy was somewhat difficult due to                            significant looping. The patient tolerated the                            procedure well. The quality of the bowel                            preparation was good. The appendiceal orifice and                            the rectum were photographed. Scope In: 8:25:21 AM Scope Out: 9:03:14 AM Scope Withdrawal Time: 0 hours 24 minutes 38 seconds  Total Procedure Duration: 0 hours 37 minutes 53 seconds  Findings:      The perianal and digital rectal examinations were normal.      A 10 mm polyp was found in the mid sigmoid colon. The polyp was       pedunculated. The polyp was removed with a hot snare. Resection and       retrieval were complete.  A 5 mm polyp was found in the mid sigmoid colon. The polyp was sessile.       The polyp was removed with a cold snare. Resection and retrieval were       complete.      A 4 mm polyp was found in the descending colon. The polyp was sessile.       The polyp was removed with a cold snare. Resection and retrieval were       complete.      A 5 mm polyp was found in the distal transverse colon. The polyp was       sessile. The polyp was removed with a cold snare. Resection and       retrieval were complete.      The exam was otherwise without abnormality. Impression:               - One 10 mm polyp in the mid sigmoid colon, removed                            with a hot snare. Resected and retrieved.                           - One 5 mm  polyp in the mid sigmoid colon, removed                            with a cold snare. Resected and retrieved.                           - One 4 mm polyp in the descending colon, removed                            with a cold snare. Resected and retrieved.                           - One 5 mm polyp in the distal transverse colon,                            removed with a cold snare. Resected and retrieved.                           - The examination was otherwise normal. Moderate Sedation:      N/A- Per Anesthesia Care Recommendation:           - Patient has a contact number available for                            emergencies. The signs and symptoms of potential                            delayed complications were discussed with the                            patient. Return to normal activities tomorrow.  Written discharge instructions were provided to the                            patient.                           - Repeat colonoscopy date to be determined after                            pending pathology results are reviewed for                            surveillance.                           - Resume previous diet.                           - Continue present medications. Procedure Code(s):        --- Professional ---                           [phone], Colonoscopy, flexible; with removal of                            tumor(s), polyp(s), or other lesion(s) by snare                            technique Diagnosis Code(s):        --- Professional ---                           Z12.11, Encounter for screening for malignant                            neoplasm of colon                           D12.5, Benign neoplasm of sigmoid colon                           D12.4, Benign neoplasm of descending colon                           D12.3, Benign neoplasm of transverse colon (hepatic                            flexure or splenic flexure) CPT copyright 2016 American  Medical Association. All rights reserved. The codes documented in this report are preliminary and upon coder review may  be revised to meet current compliance requirements. Earle Gell, MD Garlan Fair, MD 07/09/2016 9:13:44 AM This report has been signed electronically. Number of Addenda: 0

## 2016-07-09 NOTE — H&P (Signed)
Problems: Dysphagia. Positive cologuard stool based colon cancer screening test. 04/22/2016 barium esophagram with tablet showed cervical spine osteophytes indenting the esophagus, nonspecific esophageal dysmotility  History: The patient is a 75 year old female born 10/08/1940. She is scheduled to undergo a screening colonoscopy to evaluate her positive cologuard stool based colon cancer screening test and diagnostic esophagogastroduodenoscopy to evaluate intermittent esophageal dysphagia.  Past medical history: Herniorrhaphy with mesh. Appendectomy. Cervical polyp surgery. Tonsillectomy. Foot surgery. Laparotomy to treat endometriosis. Hypertension. Hypercholesterolemia. Gastroesophageal reflux. Osteoarthritis. Endometriosis with barium radiation in 1966. Herpes of the left eye. Chronic obstructive pulmonary disease. Type 2 diabetes mellitus.  Medication allergies: Aspirin. Percocet. Ibuprofen. Bee sting.  Exam: The patient is alert and lying comfortably on the endoscopy stretcher. Abdomen is soft and nontender to palpation. Lungs are clear to auscultation. Cardiac exam reveals a regular rhythm.  Plan: Proceed with diagnostic esophagogastroduodenoscopy followed by screening colonoscopy

## 2016-07-09 NOTE — Discharge Instructions (Signed)

## 2016-07-10 ENCOUNTER — Encounter (HOSPITAL_COMMUNITY): Payer: Self-pay | Admitting: Gastroenterology

## 2016-07-31 ENCOUNTER — Ambulatory Visit
Admission: RE | Admit: 2016-07-31 | Discharge: 2016-07-31 | Disposition: A | Discharge status: Home / Self Care | Payer: Medicare Other | Source: Ambulatory Visit | Attending: Internal Medicine | Admitting: Internal Medicine

## 2016-07-31 DIAGNOSIS — Z1231 Encounter for screening mammogram for malignant neoplasm of breast: Secondary | ICD-10-CM | POA: Diagnosis not present

## 2016-09-30 DIAGNOSIS — E119 Type 2 diabetes mellitus without complications: Secondary | ICD-10-CM | POA: Diagnosis not present

## 2016-09-30 DIAGNOSIS — J449 Chronic obstructive pulmonary disease, unspecified: Secondary | ICD-10-CM | POA: Diagnosis not present

## 2016-09-30 DIAGNOSIS — Z7984 Long term (current) use of oral hypoglycemic drugs: Secondary | ICD-10-CM | POA: Diagnosis not present

## 2016-09-30 DIAGNOSIS — G8929 Other chronic pain: Secondary | ICD-10-CM | POA: Diagnosis not present

## 2016-09-30 DIAGNOSIS — E782 Mixed hyperlipidemia: Secondary | ICD-10-CM | POA: Diagnosis not present

## 2016-09-30 DIAGNOSIS — I10 Essential (primary) hypertension: Secondary | ICD-10-CM | POA: Diagnosis not present

## 2016-09-30 DIAGNOSIS — J209 Acute bronchitis, unspecified: Secondary | ICD-10-CM | POA: Diagnosis not present

## 2016-10-04 ENCOUNTER — Ambulatory Visit (HOSPITAL_BASED_OUTPATIENT_CLINIC_OR_DEPARTMENT_OTHER): Payer: Medicare Other | Admitting: Oncology

## 2016-10-04 ENCOUNTER — Telehealth: Payer: Self-pay | Admitting: Oncology

## 2016-10-04 ENCOUNTER — Other Ambulatory Visit (HOSPITAL_BASED_OUTPATIENT_CLINIC_OR_DEPARTMENT_OTHER): Payer: Medicare Other

## 2016-10-04 VITALS — BP 169/93 | HR 93 | Temp 98.8°F | Resp 18 | Ht 63.0 in | Wt 195.4 lb

## 2016-10-04 DIAGNOSIS — D72829 Elevated white blood cell count, unspecified: Secondary | ICD-10-CM

## 2016-10-04 DIAGNOSIS — D509 Iron deficiency anemia, unspecified: Secondary | ICD-10-CM

## 2016-10-04 LAB — CBC WITH DIFFERENTIAL/PLATELET
BASO%: 0.2 % (ref 0.0–2.0)
Basophils Absolute: 0 10*3/uL (ref 0.0–0.1)
EOS%: 0.1 % (ref 0.0–7.0)
Eosinophils Absolute: 0 10*3/uL (ref 0.0–0.5)
HCT: 36.2 % (ref 34.8–46.6)
HGB: 11.4 g/dL — ABNORMAL LOW (ref 11.6–15.9)
LYMPH%: 13.1 % — ABNORMAL LOW (ref 14.0–49.7)
MCH: 27.3 pg (ref 25.1–34.0)
MCHC: 31.6 g/dL (ref 31.5–36.0)
MCV: 86.6 fL (ref 79.5–101.0)
MONO#: 1.5 10*3/uL — ABNORMAL HIGH (ref 0.1–0.9)
MONO%: 8.5 % (ref 0.0–14.0)
NEUT#: 13.8 10*3/uL — ABNORMAL HIGH (ref 1.5–6.5)
NEUT%: 78.1 % — ABNORMAL HIGH (ref 38.4–76.8)
Platelets: 254 10*3/uL (ref 145–400)
RBC: 4.18 10*6/uL (ref 3.70–5.45)
RDW: 15.5 % — ABNORMAL HIGH (ref 11.2–14.5)
WBC: 17.6 10*3/uL — ABNORMAL HIGH (ref 3.9–10.3)
lymph#: 2.3 10*3/uL (ref 0.9–3.3)

## 2016-10-04 LAB — IRON AND TIBC
%SAT: 14 % — ABNORMAL LOW (ref 21–57)
Iron: 46 ug/dL (ref 41–142)
TIBC: 323 ug/dL (ref 236–444)
UIBC: 276 ug/dL (ref 120–384)

## 2016-10-04 LAB — FERRITIN: Ferritin: 25 ng/ml (ref 9–269)

## 2016-10-04 NOTE — Telephone Encounter (Signed)
Called patient to inform her of her new scheduled appointment times. Spoke with patient she is satisfied with dates/times given.

## 2016-10-04 NOTE — Progress Notes (Signed)
Hematology and Oncology Follow Up Visit  Christina Bennett [id number] 04-Oct-1940 76 y.o. 10/04/2016 10:34 AM   Principle Diagnosis: 76 year old with iron deficiency anemia diagnosed in 04/2012. Her GI work up did not show a clear cut source of bleeding.  Prior therapy: IV iron in the form of Feraheme of total 1000 mg given intermittently last treatment given in July 2017.  Current therapy: Observation and surveillance. Repeat IV iron as needed.  Interim History: [name] presents today for a follow up visit. Since her last visit, she underwent repeat colonoscopy and endoscopy with polyps removed without any evidence of malignancy. She did not report any hematochezia or melena prior or since that time. She was diagnosed with upper respiratory tract infection and flu. She is currently on prednisone and finishing that course. Her respiratory symptoms are improving slowly at this time. She denied any chest pain or difficulty breathing.  He does not report any headaches or blurred vision or double vision. She does not report any syncope or seizures. She did not report any chest pain or shortness of breath. She does not report any hemoptysis. She does not report any nausea, vomiting or abdominal pain. Is not reporting any frequency urgency or hesitancy. Her remaining review of systems unremarkable.    Medications: I have reviewed the patient's current medications.  Current Outpatient Prescriptions  Medication Sig Dispense Refill  . albuterol (PROVENTIL HFA;VENTOLIN HFA) 108 (90 BASE) MCG/ACT inhaler Inhale 2 puffs into the lungs every 4 (four) hours as needed for wheezing or shortness of breath.     Marland Kitchen azithromycin (ZITHROMAX) 250 MG tablet     . fluticasone (FLONASE) 50 MCG/ACT nasal spray Place 2 sprays into both nostrils every evening.     Marland Kitchen HYDROcodone-acetaminophen (NORCO/VICODIN) 5-325 MG tablet Take 1-2 tablets by mouth every 6 (six) hours as needed for moderate pain. (Patient taking  differently: Take 2 tablets by mouth every evening. ) 25 tablet 0  . loratadine (CLARITIN) 10 MG tablet Take 10 mg by mouth 2 (two) times daily.    Marland Kitchen losartan-hydrochlorothiazide (HYZAAR) 50-12.5 MG per tablet Take 1 tablet by mouth daily.   2  . metFORMIN (GLUCOPHAGE) 500 MG tablet Take 500 mg by mouth 2 (two) times daily with a meal.    . montelukast (SINGULAIR) 10 MG tablet Take 10 mg by mouth every evening.   2  . omeprazole (PRILOSEC) 20 MG capsule Take 20 mg by mouth daily as needed (heartburn).    . prednisoLONE acetate (PRED FORTE) 1 % ophthalmic suspension Place 1 drop into the left eye 3 (three) times daily.   5  . predniSONE (DELTASONE) 10 MG tablet     . simvastatin (ZOCOR) 40 MG tablet Take 40 mg by mouth every evening.    . SYMBICORT 160-4.5 MCG/ACT inhaler Inhale 2 puffs into the lungs every evening.   4  . valACYclovir (VALTREX) 500 MG tablet Take 500 mg by mouth 2 (two) times daily.   0  . Vitamin D, Ergocalciferol, (DRISDOL) 50000 UNITS CAPS Take 50,000 Units by mouth every 14 (fourteen) days.     No current facility-administered medications for this visit.     Allergies:  Allergies  Allergen Reactions  . Aspirin Swelling and Rash    Throat swells.  . Bee Venom Anaphylaxis  . Ibuprofen Shortness Of Breath and Swelling    Throat swells.  Richardo Hanks [Oxycodone-Acetaminophen] Itching    Past Medical History, Surgical history, Social history, and Family History were reviewed and  updated.    Physical Exam: Blood pressure (!) 169/93, pulse 93, temperature 98.8 F (37.1 C), temperature source Oral, resp. rate 18, height 5\' 3"  (1.6 m), weight 195 lb 6.4 oz (88.6 kg), SpO2 93 %. ECOG: 0 General appearance: Alert, awake woman without distress. Head: Normocephalic, without obvious abnormality No oral thrush. Neck: no adenopathy Lymph nodes: Cervical, supraclavicular, and axillary nodes normal. Heart:regular rate and rhythm, S1, S2 normal, no murmur, click, rub or  gallop Lung:chest clear, no wheezing, rales, normal symmetric air entry Abdomen: soft, non-tender, without masses or organomegaly no rebound or guarding. EXT:no erythema, induration, or nodules   Lab Results: Lab Results  Component Value Date   WBC 17.6 (H) 10/04/2016   HGB 11.4 (L) 10/04/2016   HCT 36.2 10/04/2016   MCV 86.6 10/04/2016   PLT 254 10/04/2016     Chemistry      Component Value Date/Time   NA 136 05/07/2016 0326   NA 141 05/02/2015 0952   K 3.8 05/07/2016 0326   K 4.2 05/02/2015 0952   CL 101 05/07/2016 0326   CL 104 04/23/2012 1017   CO2 23 05/07/2016 0326   CO2 28 05/02/2015 0952   BUN 22 (H) 05/07/2016 0326   BUN 13.0 05/02/2015 0952   CREATININE 1.06 (H) 05/07/2016 0326   CREATININE 0.7 05/02/2015 0952      Component Value Date/Time   CALCIUM 9.2 05/07/2016 0326   CALCIUM 9.4 05/02/2015 0952   ALKPHOS 81 05/07/2016 0326   ALKPHOS 82 05/02/2015 0952   AST 18 05/07/2016 0326   AST 12 05/02/2015 0952   ALT 9 (L) 05/07/2016 0326   ALT 7 05/02/2015 0952   BILITOT 0.8 05/07/2016 0326   BILITOT 0.38 05/02/2015 0952      Results for Christina, Bennett (MRN [id number]) as of 10/04/2016 10:25  Ref. Range 06/07/2016 11:02  Iron Latest Ref Range: 41 - 142 ug/dL 36 (L)  UIBC Latest Ref Range: 120 - 384 ug/dL 278  TIBC Latest Ref Range: 236 - 444 ug/dL 314  %SAT Latest Ref Range: 21 - 57 % 11 (L)  Ferritin Latest Ref Range: 9 - 269 ng/ml 61     Impression and Plan:  76 year old woman with the following issues:   1. 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 She is status post optimal IV iron infusions in the past.  Her last IV iron infusion was June 2017 with normalization of her hemoglobin.   Her iron studies and hemoglobin have normalized but potentially be declining slowly. She will likely require repeat IV iron soon. She is asymptomatic at this time I would like to defer that option to the summertime. We  will schedule follow-up on that time and repeat infusion.  2. Colon cancer screening: She is up-to-date at this time.  3. Leukocytosis. This appears to be a reactive in nature. Her white cell count has increased related to prednisone use.   4. Followup: Will be in 5 months to repeat laboratory testing and likely IV iron infusion.     [id number] 2/23/201810:34 AM

## 2016-10-04 NOTE — Telephone Encounter (Signed)
Appointments scheduled per 2/23 LOS. Patient given AVS report and calendars with future scheduled appointments. °

## 2016-10-23 DIAGNOSIS — B0051 Herpesviral iridocyclitis: Secondary | ICD-10-CM | POA: Diagnosis not present

## 2016-10-23 DIAGNOSIS — B0052 Herpesviral keratitis: Secondary | ICD-10-CM | POA: Diagnosis not present

## 2016-10-23 DIAGNOSIS — Z961 Presence of intraocular lens: Secondary | ICD-10-CM | POA: Diagnosis not present

## 2016-10-23 DIAGNOSIS — H04123 Dry eye syndrome of bilateral lacrimal glands: Secondary | ICD-10-CM | POA: Diagnosis not present

## 2016-10-23 DIAGNOSIS — H2511 Age-related nuclear cataract, right eye: Secondary | ICD-10-CM | POA: Diagnosis not present

## 2016-11-06 DIAGNOSIS — G894 Chronic pain syndrome: Secondary | ICD-10-CM | POA: Diagnosis not present

## 2016-11-06 DIAGNOSIS — I10 Essential (primary) hypertension: Secondary | ICD-10-CM | POA: Diagnosis not present

## 2016-11-06 DIAGNOSIS — L989 Disorder of the skin and subcutaneous tissue, unspecified: Secondary | ICD-10-CM | POA: Diagnosis not present

## 2016-11-19 DIAGNOSIS — L821 Other seborrheic keratosis: Secondary | ICD-10-CM | POA: Diagnosis not present

## 2016-11-19 DIAGNOSIS — H61001 Unspecified perichondritis of right external ear: Secondary | ICD-10-CM | POA: Diagnosis not present

## 2016-11-27 DIAGNOSIS — Z961 Presence of intraocular lens: Secondary | ICD-10-CM | POA: Diagnosis not present

## 2016-11-27 DIAGNOSIS — H2511 Age-related nuclear cataract, right eye: Secondary | ICD-10-CM | POA: Diagnosis not present

## 2016-11-27 DIAGNOSIS — B0052 Herpesviral keratitis: Secondary | ICD-10-CM | POA: Diagnosis not present

## 2016-12-09 DIAGNOSIS — Z87891 Personal history of nicotine dependence: Secondary | ICD-10-CM | POA: Diagnosis not present

## 2016-12-09 DIAGNOSIS — Z7984 Long term (current) use of oral hypoglycemic drugs: Secondary | ICD-10-CM | POA: Diagnosis not present

## 2016-12-09 DIAGNOSIS — E119 Type 2 diabetes mellitus without complications: Secondary | ICD-10-CM | POA: Diagnosis not present

## 2016-12-09 DIAGNOSIS — J449 Chronic obstructive pulmonary disease, unspecified: Secondary | ICD-10-CM | POA: Diagnosis not present

## 2016-12-09 DIAGNOSIS — H1132 Conjunctival hemorrhage, left eye: Secondary | ICD-10-CM | POA: Diagnosis not present

## 2016-12-09 DIAGNOSIS — H2511 Age-related nuclear cataract, right eye: Secondary | ICD-10-CM | POA: Diagnosis not present

## 2016-12-09 DIAGNOSIS — B0052 Herpesviral keratitis: Secondary | ICD-10-CM | POA: Diagnosis not present

## 2016-12-09 DIAGNOSIS — H209 Unspecified iridocyclitis: Secondary | ICD-10-CM | POA: Diagnosis not present

## 2016-12-10 DIAGNOSIS — H1132 Conjunctival hemorrhage, left eye: Secondary | ICD-10-CM | POA: Diagnosis not present

## 2016-12-10 DIAGNOSIS — B0052 Herpesviral keratitis: Secondary | ICD-10-CM | POA: Diagnosis not present

## 2016-12-10 DIAGNOSIS — H2511 Age-related nuclear cataract, right eye: Secondary | ICD-10-CM | POA: Diagnosis not present

## 2016-12-10 DIAGNOSIS — H179 Unspecified corneal scar and opacity: Secondary | ICD-10-CM | POA: Diagnosis not present

## 2016-12-10 DIAGNOSIS — H43392 Other vitreous opacities, left eye: Secondary | ICD-10-CM | POA: Diagnosis not present

## 2016-12-16 ENCOUNTER — Encounter: Payer: Self-pay | Admitting: *Deleted

## 2016-12-16 DIAGNOSIS — Z961 Presence of intraocular lens: Secondary | ICD-10-CM | POA: Diagnosis not present

## 2016-12-16 DIAGNOSIS — B0052 Herpesviral keratitis: Secondary | ICD-10-CM | POA: Diagnosis not present

## 2016-12-16 DIAGNOSIS — H25811 Combined forms of age-related cataract, right eye: Secondary | ICD-10-CM | POA: Diagnosis not present

## 2016-12-17 DIAGNOSIS — B0052 Herpesviral keratitis: Secondary | ICD-10-CM | POA: Diagnosis not present

## 2016-12-17 DIAGNOSIS — D72829 Elevated white blood cell count, unspecified: Secondary | ICD-10-CM | POA: Diagnosis not present

## 2016-12-17 DIAGNOSIS — R899 Unspecified abnormal finding in specimens from other organs, systems and tissues: Secondary | ICD-10-CM | POA: Diagnosis not present

## 2016-12-26 DIAGNOSIS — H179 Unspecified corneal scar and opacity: Secondary | ICD-10-CM | POA: Diagnosis not present

## 2016-12-26 DIAGNOSIS — H43392 Other vitreous opacities, left eye: Secondary | ICD-10-CM | POA: Diagnosis not present

## 2016-12-30 DIAGNOSIS — H1712 Central corneal opacity, left eye: Secondary | ICD-10-CM | POA: Diagnosis not present

## 2016-12-30 DIAGNOSIS — H2511 Age-related nuclear cataract, right eye: Secondary | ICD-10-CM | POA: Diagnosis not present

## 2016-12-30 DIAGNOSIS — B0052 Herpesviral keratitis: Secondary | ICD-10-CM | POA: Diagnosis not present

## 2017-01-08 DIAGNOSIS — H2511 Age-related nuclear cataract, right eye: Secondary | ICD-10-CM | POA: Diagnosis not present

## 2017-01-08 DIAGNOSIS — B0052 Herpesviral keratitis: Secondary | ICD-10-CM | POA: Diagnosis not present

## 2017-01-08 DIAGNOSIS — H1712 Central corneal opacity, left eye: Secondary | ICD-10-CM | POA: Diagnosis not present

## 2017-01-29 DIAGNOSIS — B0052 Herpesviral keratitis: Secondary | ICD-10-CM | POA: Diagnosis not present

## 2017-01-29 DIAGNOSIS — Z961 Presence of intraocular lens: Secondary | ICD-10-CM | POA: Diagnosis not present

## 2017-01-29 DIAGNOSIS — H20012 Primary iridocyclitis, left eye: Secondary | ICD-10-CM | POA: Diagnosis not present

## 2017-01-29 DIAGNOSIS — H16012 Central corneal ulcer, left eye: Secondary | ICD-10-CM | POA: Diagnosis not present

## 2017-02-03 DIAGNOSIS — B0052 Herpesviral keratitis: Secondary | ICD-10-CM | POA: Diagnosis not present

## 2017-02-03 DIAGNOSIS — H16012 Central corneal ulcer, left eye: Secondary | ICD-10-CM | POA: Diagnosis not present

## 2017-02-03 DIAGNOSIS — Z961 Presence of intraocular lens: Secondary | ICD-10-CM | POA: Diagnosis not present

## 2017-02-03 DIAGNOSIS — H20012 Primary iridocyclitis, left eye: Secondary | ICD-10-CM | POA: Diagnosis not present

## 2017-02-07 DIAGNOSIS — E1165 Type 2 diabetes mellitus with hyperglycemia: Secondary | ICD-10-CM | POA: Diagnosis not present

## 2017-02-07 DIAGNOSIS — Z7984 Long term (current) use of oral hypoglycemic drugs: Secondary | ICD-10-CM | POA: Diagnosis not present

## 2017-02-07 DIAGNOSIS — E119 Type 2 diabetes mellitus without complications: Secondary | ICD-10-CM | POA: Diagnosis not present

## 2017-02-10 DIAGNOSIS — B0052 Herpesviral keratitis: Secondary | ICD-10-CM | POA: Diagnosis not present

## 2017-02-10 DIAGNOSIS — Z961 Presence of intraocular lens: Secondary | ICD-10-CM | POA: Diagnosis not present

## 2017-02-10 DIAGNOSIS — H2012 Chronic iridocyclitis, left eye: Secondary | ICD-10-CM | POA: Diagnosis not present

## 2017-02-10 DIAGNOSIS — H16012 Central corneal ulcer, left eye: Secondary | ICD-10-CM | POA: Diagnosis not present

## 2017-02-13 DIAGNOSIS — H2511 Age-related nuclear cataract, right eye: Secondary | ICD-10-CM | POA: Diagnosis not present

## 2017-02-13 DIAGNOSIS — B0052 Herpesviral keratitis: Secondary | ICD-10-CM | POA: Diagnosis not present

## 2017-02-13 DIAGNOSIS — H179 Unspecified corneal scar and opacity: Secondary | ICD-10-CM | POA: Diagnosis not present

## 2017-02-13 DIAGNOSIS — H43392 Other vitreous opacities, left eye: Secondary | ICD-10-CM | POA: Diagnosis not present

## 2017-02-13 DIAGNOSIS — H43812 Vitreous degeneration, left eye: Secondary | ICD-10-CM | POA: Diagnosis not present

## 2017-02-17 DIAGNOSIS — Z961 Presence of intraocular lens: Secondary | ICD-10-CM | POA: Diagnosis not present

## 2017-02-17 DIAGNOSIS — H16012 Central corneal ulcer, left eye: Secondary | ICD-10-CM | POA: Diagnosis not present

## 2017-02-17 DIAGNOSIS — B0052 Herpesviral keratitis: Secondary | ICD-10-CM | POA: Diagnosis not present

## 2017-02-26 DIAGNOSIS — B0052 Herpesviral keratitis: Secondary | ICD-10-CM | POA: Diagnosis not present

## 2017-02-26 DIAGNOSIS — Z961 Presence of intraocular lens: Secondary | ICD-10-CM | POA: Diagnosis not present

## 2017-02-26 DIAGNOSIS — H16012 Central corneal ulcer, left eye: Secondary | ICD-10-CM | POA: Diagnosis not present

## 2017-03-05 ENCOUNTER — Ambulatory Visit: Payer: Medicare Other | Admitting: Oncology

## 2017-03-05 ENCOUNTER — Other Ambulatory Visit (HOSPITAL_BASED_OUTPATIENT_CLINIC_OR_DEPARTMENT_OTHER): Payer: Medicare Other

## 2017-03-05 ENCOUNTER — Telehealth: Payer: Self-pay | Admitting: Oncology

## 2017-03-05 ENCOUNTER — Ambulatory Visit (HOSPITAL_BASED_OUTPATIENT_CLINIC_OR_DEPARTMENT_OTHER): Payer: Medicare Other

## 2017-03-05 ENCOUNTER — Ambulatory Visit (HOSPITAL_BASED_OUTPATIENT_CLINIC_OR_DEPARTMENT_OTHER): Payer: Medicare Other | Admitting: Oncology

## 2017-03-05 ENCOUNTER — Other Ambulatory Visit: Payer: Medicare Other

## 2017-03-05 VITALS — BP 112/59 | HR 88 | Temp 98.6°F | Resp 18

## 2017-03-05 VITALS — BP 140/60 | HR 88 | Temp 98.9°F | Resp 18 | Ht 63.0 in | Wt 203.3 lb

## 2017-03-05 DIAGNOSIS — D500 Iron deficiency anemia secondary to blood loss (chronic): Secondary | ICD-10-CM

## 2017-03-05 DIAGNOSIS — D509 Iron deficiency anemia, unspecified: Secondary | ICD-10-CM

## 2017-03-05 LAB — CBC WITH DIFFERENTIAL/PLATELET
BASO%: 0.6 % (ref 0.0–2.0)
Basophils Absolute: 0.1 10*3/uL (ref 0.0–0.1)
EOS%: 1.9 % (ref 0.0–7.0)
Eosinophils Absolute: 0.2 10*3/uL (ref 0.0–0.5)
HCT: 35.4 % (ref 34.8–46.6)
HGB: 11.1 g/dL — ABNORMAL LOW (ref 11.6–15.9)
LYMPH%: 13.2 % — ABNORMAL LOW (ref 14.0–49.7)
MCH: 27.3 pg (ref 25.1–34.0)
MCHC: 31.5 g/dL (ref 31.5–36.0)
MCV: 86.7 fL (ref 79.5–101.0)
MONO#: 1 10*3/uL — ABNORMAL HIGH (ref 0.1–0.9)
MONO%: 8.3 % (ref 0.0–14.0)
NEUT#: 8.7 10*3/uL — ABNORMAL HIGH (ref 1.5–6.5)
NEUT%: 76 % (ref 38.4–76.8)
Platelets: 196 10*3/uL (ref 145–400)
RBC: 4.08 10*6/uL (ref 3.70–5.45)
RDW: 18.2 % — ABNORMAL HIGH (ref 11.2–14.5)
WBC: 11.5 10*3/uL — ABNORMAL HIGH (ref 3.9–10.3)
lymph#: 1.5 10*3/uL (ref 0.9–3.3)

## 2017-03-05 LAB — FERRITIN: Ferritin: 26 ng/ml (ref 9–269)

## 2017-03-05 LAB — IRON AND TIBC
%SAT: 11 % — ABNORMAL LOW (ref 21–57)
Iron: 39 ug/dL — ABNORMAL LOW (ref 41–142)
TIBC: 359 ug/dL (ref 236–444)
UIBC: 319 ug/dL (ref 120–384)

## 2017-03-05 MED ORDER — FERUMOXYTOL INJECTION 510 MG/17 ML
510.0000 mg | Freq: Once | INTRAVENOUS | Status: AC
  Administered 2017-03-05: 510 mg via INTRAVENOUS
  Filled 2017-03-05: qty 17

## 2017-03-05 MED ORDER — SODIUM CHLORIDE 0.9 % IV SOLN
Freq: Once | INTRAVENOUS | Status: AC
  Administered 2017-03-05: 10:00:00 via INTRAVENOUS

## 2017-03-05 NOTE — Telephone Encounter (Signed)
Scheduled appt per 7/25 los - Gave patient AVS and calender per los.  

## 2017-03-05 NOTE — Progress Notes (Signed)
Hematology and Oncology Follow Up Visit  Christina Bennett [id number] [birth year] 76 y.o. 03/05/2017 9:26 AM   Principle Diagnosis: 76 year old with iron deficiency anemia diagnosed in 04/2012. Her GI work up did not show a clear cut source of bleeding.  Prior therapy: IV iron in the form of Feraheme of total 1000 mg given intermittently last treatment given in July 2017.  Current therapy: Observation and surveillance. Repeat IV iron scheduled for July 2018.  Interim History: Christina Bennett presents today for a follow up visit. Since her last visit, she reports increased fatigue and tiredness. She denied any chest pain or exertional dyspnea. She did not report any hematochezia or melena prior or since that time. Her respiratory symptoms are improving slowly at this time after dealing with a respiratory illness in February 2018. She continues to eat reasonably well and her performance status has not changed.  He does not report any headaches or blurred vision or double vision. She does not report any syncope or seizures. She did not report any chest pain or shortness of breath. She does not report any hemoptysis. She does not report any nausea, vomiting or abdominal pain. Is not reporting any frequency urgency or hesitancy. Her remaining review of systems unremarkable.    Medications: I have reviewed the patient's current medications.  Current Outpatient Prescriptions  Medication Sig Dispense Refill  . albuterol (PROVENTIL HFA;VENTOLIN HFA) 108 (90 BASE) MCG/ACT inhaler Inhale 2 puffs into the lungs every 4 (four) hours as needed for wheezing or shortness of breath.     Marland Kitchen azithromycin (ZITHROMAX) 250 MG tablet     . fluticasone (FLONASE) 50 MCG/ACT nasal spray Place 2 sprays into both nostrils every evening.     Marland Kitchen HYDROcodone-acetaminophen (NORCO/VICODIN) 5-325 MG tablet Take 1-2 tablets by mouth every 6 (six) hours as needed for moderate pain. (Patient taking differently: Take 2 tablets by mouth  every evening. ) 25 tablet 0  . loratadine (CLARITIN) 10 MG tablet Take 10 mg by mouth 2 (two) times daily.    Marland Kitchen losartan-hydrochlorothiazide (HYZAAR) 50-12.5 MG per tablet Take 1 tablet by mouth daily.   2  . metFORMIN (GLUCOPHAGE) 500 MG tablet Take 500 mg by mouth 2 (two) times daily with a meal.    . montelukast (SINGULAIR) 10 MG tablet Take 10 mg by mouth every evening.   2  . omeprazole (PRILOSEC) 20 MG capsule Take 20 mg by mouth daily as needed (heartburn).    . prednisoLONE acetate (PRED FORTE) 1 % ophthalmic suspension Place 1 drop into the left eye 3 (three) times daily.   5  . predniSONE (DELTASONE) 10 MG tablet     . simvastatin (ZOCOR) 40 MG tablet Take 40 mg by mouth every evening.    . SYMBICORT 160-4.5 MCG/ACT inhaler Inhale 2 puffs into the lungs every evening.   4  . valACYclovir (VALTREX) 500 MG tablet Take 500 mg by mouth 2 (two) times daily.   0  . Vitamin D, Ergocalciferol, (DRISDOL) 50000 UNITS CAPS Take 50,000 Units by mouth every 14 (fourteen) days.     No current facility-administered medications for this visit.     Allergies:  Allergies  Allergen Reactions  . Aspirin Swelling and Rash    Throat swells.  . Bee Venom Anaphylaxis  . Ibuprofen Shortness Of Breath and Swelling    Throat swells.  Christina Bennett [Oxycodone-Acetaminophen] Itching    Past Medical History, Surgical history, Social history, and Family History were reviewed and updated.  Physical Exam: Blood pressure 140/60, pulse 88, temperature 98.9 F (37.2 C), temperature source Oral, resp. rate 18, height 5\' 3"  (1.6 m), weight 203 lb 4.8 oz (92.2 kg), SpO2 93 %. ECOG: 0 General appearance: Pleasant-appearing woman without distress. Head: Normocephalic, without obvious abnormality No oral ulcers or thrush. Neck: no adenopathy Lymph nodes: Cervical, supraclavicular, and axillary nodes normal. Heart:regular rate and rhythm, S1, S2 normal, no murmur, click, rub or gallop Lung:chest clear, no  wheezing, rales, normal symmetric air entry Abdomen: soft, non-tender, without masses or organomegaly no shifting dullness or ascites. EXT:no erythema, induration, or nodules   Lab Results: Lab Results  Component Value Date   WBC 11.5 (H) 03/05/2017   HGB 11.1 (L) 03/05/2017   HCT 35.4 03/05/2017   MCV 86.7 03/05/2017   PLT 196 03/05/2017     Chemistry      Component Value Date/Time   NA 136 05/07/2016 0326   NA 141 05/02/2015 0952   K 3.8 05/07/2016 0326   K 4.2 05/02/2015 0952   CL 101 05/07/2016 0326   CL 104 04/23/2012 1017   CO2 23 05/07/2016 0326   CO2 28 05/02/2015 0952   BUN 22 (H) 05/07/2016 0326   BUN 13.0 05/02/2015 0952   CREATININE 1.06 (H) 05/07/2016 0326   CREATININE 0.7 05/02/2015 0952      Component Value Date/Time   CALCIUM 9.2 05/07/2016 0326   CALCIUM 9.4 05/02/2015 0952   ALKPHOS 81 05/07/2016 0326   ALKPHOS 82 05/02/2015 0952   AST 18 05/07/2016 0326   AST 12 05/02/2015 0952   ALT 9 (L) 05/07/2016 0326   ALT 7 05/02/2015 0952   BILITOT 0.8 05/07/2016 0326   BILITOT 0.38 05/02/2015 0952     Results for Christina Bennett (MRN [id number]) as of 03/05/2017 09:19  Ref. Range 10/04/2016 10:03  Iron Latest Ref Range: 41 - 142 ug/dL 46  UIBC Latest Ref Range: 120 - 384 ug/dL 276  TIBC Latest Ref Range: 236 - 444 ug/dL 323  %SAT Latest Ref Range: 21 - 57 % 14 (L)  Ferritin Latest Ref Range: 9 - 269 ng/ml 25       Impression and Plan:  76 year old woman with the following issues:   1. 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 She is status post optimal IV iron infusions in the past.  Her last IV iron infusion was June 2017 with normalization of her hemoglobin.   Her Hemoglobin continues to drift down with increased RDW indicating worsening iron deficiency. Risks and benefits of IV iron infusion were reviewed today and she is agreeable to proceed. Complications associated with Feraheme were discussed  again which include arthralgias, myalgias and rarely anaphylaxis.  2. Colon cancer screening: She is up-to-date at this time.  3. Leukocytosis. This appears to be a reactive in nature. Her white cell count appears to normalized.   4. Followup: Will be in 6 months to repeat iron studies.     Cyd Hostler 7/25/20189:26 AM

## 2017-03-05 NOTE — Patient Instructions (Signed)

## 2017-03-12 ENCOUNTER — Ambulatory Visit (HOSPITAL_BASED_OUTPATIENT_CLINIC_OR_DEPARTMENT_OTHER): Payer: Medicare Other

## 2017-03-12 VITALS — BP 133/70 | HR 90 | Temp 99.6°F | Resp 18

## 2017-03-12 DIAGNOSIS — D500 Iron deficiency anemia secondary to blood loss (chronic): Secondary | ICD-10-CM

## 2017-03-12 DIAGNOSIS — D509 Iron deficiency anemia, unspecified: Secondary | ICD-10-CM | POA: Diagnosis present

## 2017-03-12 MED ORDER — SODIUM CHLORIDE 0.9 % IV SOLN
510.0000 mg | Freq: Once | INTRAVENOUS | Status: AC
  Administered 2017-03-12: 510 mg via INTRAVENOUS
  Filled 2017-03-12: qty 17

## 2017-03-12 MED ORDER — SODIUM CHLORIDE 0.9 % IV SOLN
Freq: Once | INTRAVENOUS | Status: AC
  Administered 2017-03-12: 12:00:00 via INTRAVENOUS

## 2017-03-12 NOTE — Patient Instructions (Signed)

## 2017-03-19 DIAGNOSIS — Z961 Presence of intraocular lens: Secondary | ICD-10-CM | POA: Diagnosis not present

## 2017-03-19 DIAGNOSIS — H02052 Trichiasis without entropian right lower eyelid: Secondary | ICD-10-CM | POA: Diagnosis not present

## 2017-03-19 DIAGNOSIS — H16012 Central corneal ulcer, left eye: Secondary | ICD-10-CM | POA: Diagnosis not present

## 2017-03-19 DIAGNOSIS — B0052 Herpesviral keratitis: Secondary | ICD-10-CM | POA: Diagnosis not present

## 2017-03-26 DIAGNOSIS — H02055 Trichiasis without entropian left lower eyelid: Secondary | ICD-10-CM | POA: Diagnosis not present

## 2017-03-26 DIAGNOSIS — B0052 Herpesviral keratitis: Secondary | ICD-10-CM | POA: Diagnosis not present

## 2017-03-26 DIAGNOSIS — H16012 Central corneal ulcer, left eye: Secondary | ICD-10-CM | POA: Diagnosis not present

## 2017-03-26 DIAGNOSIS — Z961 Presence of intraocular lens: Secondary | ICD-10-CM | POA: Diagnosis not present

## 2017-04-02 DIAGNOSIS — D72829 Elevated white blood cell count, unspecified: Secondary | ICD-10-CM | POA: Diagnosis not present

## 2017-04-02 DIAGNOSIS — G894 Chronic pain syndrome: Secondary | ICD-10-CM | POA: Diagnosis not present

## 2017-04-02 DIAGNOSIS — E782 Mixed hyperlipidemia: Secondary | ICD-10-CM | POA: Diagnosis not present

## 2017-04-02 DIAGNOSIS — E611 Iron deficiency: Secondary | ICD-10-CM | POA: Diagnosis not present

## 2017-04-02 DIAGNOSIS — E119 Type 2 diabetes mellitus without complications: Secondary | ICD-10-CM | POA: Diagnosis not present

## 2017-04-02 DIAGNOSIS — R0981 Nasal congestion: Secondary | ICD-10-CM | POA: Diagnosis not present

## 2017-04-02 DIAGNOSIS — I10 Essential (primary) hypertension: Secondary | ICD-10-CM | POA: Diagnosis not present

## 2017-04-02 DIAGNOSIS — E1165 Type 2 diabetes mellitus with hyperglycemia: Secondary | ICD-10-CM | POA: Diagnosis not present

## 2017-04-02 DIAGNOSIS — J449 Chronic obstructive pulmonary disease, unspecified: Secondary | ICD-10-CM | POA: Diagnosis not present

## 2017-04-02 DIAGNOSIS — Z0000 Encounter for general adult medical examination without abnormal findings: Secondary | ICD-10-CM | POA: Diagnosis not present

## 2017-04-02 DIAGNOSIS — Z7984 Long term (current) use of oral hypoglycemic drugs: Secondary | ICD-10-CM | POA: Diagnosis not present

## 2017-04-02 DIAGNOSIS — Z1389 Encounter for screening for other disorder: Secondary | ICD-10-CM | POA: Diagnosis not present

## 2017-04-16 DIAGNOSIS — H16012 Central corneal ulcer, left eye: Secondary | ICD-10-CM | POA: Diagnosis not present

## 2017-04-16 DIAGNOSIS — Z961 Presence of intraocular lens: Secondary | ICD-10-CM | POA: Diagnosis not present

## 2017-04-16 DIAGNOSIS — B0052 Herpesviral keratitis: Secondary | ICD-10-CM | POA: Diagnosis not present

## 2017-06-11 DIAGNOSIS — Z23 Encounter for immunization: Secondary | ICD-10-CM | POA: Diagnosis not present

## 2017-06-16 DIAGNOSIS — H2511 Age-related nuclear cataract, right eye: Secondary | ICD-10-CM | POA: Diagnosis not present

## 2017-06-16 DIAGNOSIS — Z961 Presence of intraocular lens: Secondary | ICD-10-CM | POA: Diagnosis not present

## 2017-06-16 DIAGNOSIS — H17822 Peripheral opacity of cornea, left eye: Secondary | ICD-10-CM | POA: Diagnosis not present

## 2017-06-16 DIAGNOSIS — H1712 Central corneal opacity, left eye: Secondary | ICD-10-CM | POA: Diagnosis not present

## 2017-06-16 DIAGNOSIS — B0052 Herpesviral keratitis: Secondary | ICD-10-CM | POA: Diagnosis not present

## 2017-06-24 DIAGNOSIS — H2511 Age-related nuclear cataract, right eye: Secondary | ICD-10-CM | POA: Diagnosis not present

## 2017-06-26 DIAGNOSIS — H2511 Age-related nuclear cataract, right eye: Secondary | ICD-10-CM | POA: Diagnosis not present

## 2017-09-05 ENCOUNTER — Inpatient Hospital Stay: Payer: Medicare Other

## 2017-09-05 ENCOUNTER — Telehealth: Payer: Self-pay | Admitting: Oncology

## 2017-09-05 ENCOUNTER — Inpatient Hospital Stay: Payer: Medicare Other | Attending: Oncology | Admitting: Oncology

## 2017-09-05 VITALS — BP 127/61 | HR 95 | Temp 99.0°F | Resp 18 | Ht 63.0 in | Wt 201.7 lb

## 2017-09-05 DIAGNOSIS — R400 Somnolence: Secondary | ICD-10-CM | POA: Insufficient documentation

## 2017-09-05 DIAGNOSIS — J449 Chronic obstructive pulmonary disease, unspecified: Secondary | ICD-10-CM | POA: Diagnosis not present

## 2017-09-05 DIAGNOSIS — D509 Iron deficiency anemia, unspecified: Secondary | ICD-10-CM

## 2017-09-05 LAB — CBC WITH DIFFERENTIAL/PLATELET
Basophils Absolute: 0.1 10*3/uL (ref 0.0–0.1)
Basophils Relative: 1 %
Eosinophils Absolute: 0.5 10*3/uL (ref 0.0–0.5)
Eosinophils Relative: 4 %
HCT: 36.4 % (ref 34.8–46.6)
Hemoglobin: 11.6 g/dL (ref 11.6–15.9)
Lymphocytes Relative: 14 %
Lymphs Abs: 1.8 10*3/uL (ref 0.9–3.3)
MCH: 29.4 pg (ref 25.1–34.0)
MCHC: 32 g/dL (ref 31.5–36.0)
MCV: 91.9 fL (ref 79.5–101.0)
Monocytes Absolute: 0.8 10*3/uL (ref 0.1–0.9)
Monocytes Relative: 6 %
Neutro Abs: 9.6 10*3/uL — ABNORMAL HIGH (ref 1.5–6.5)
Neutrophils Relative %: 75 %
Platelets: 245 10*3/uL (ref 145–400)
RBC: 3.96 MIL/uL (ref 3.70–5.45)
RDW: 14.8 % (ref 11.2–16.1)
WBC: 12.8 10*3/uL — ABNORMAL HIGH (ref 3.9–10.3)

## 2017-09-05 LAB — IRON AND TIBC
Iron: 42 ug/dL (ref 41–142)
Saturation Ratios: 15 % — ABNORMAL LOW (ref 21–57)
TIBC: 286 ug/dL (ref 236–444)
UIBC: 244 ug/dL

## 2017-09-05 LAB — FERRITIN: Ferritin: 73 ng/mL (ref 9–269)

## 2017-09-05 NOTE — Telephone Encounter (Signed)
Scheduled appt per 12/5 los - Gave patient AVS and calender per los.   

## 2017-09-05 NOTE — Progress Notes (Signed)
Hematology and Oncology Follow Up Visit  Christina Bennett [id number] 1940/12/09 77 y.o. 09/05/2017 10:28 AM    Principle Diagnosis: 77 year old with iron deficiency anemia related to poor absorption of oral iron diagnosed in 04/2012. Her GI work up including colonoscopy and endoscopy did not show source of bleeding.  Prior therapy: IV iron in the form of Feraheme of total 1000 mg given intermittently.   Current therapy: Observation and surveillance. Repeat IV iron as needed.  Interim History: Christina Bennett is here for a follow-up by herself.  She reports feeling reasonably well without any major changes in her health.  She does report sleepiness during the day although she does have a lot of sleep disruption.  She does report nocturia which interrupts her sleep she is also been told that she snores frequently.  She has not been diagnosed with sleep apnea but does have COPD.  She denied any hematochezia, melena or hemoptysis.  She denies any GI complaints including abdominal fullness or weight loss.  She continues to attend to activities of daily living.  She received IV iron infusion in July 2018 with some improvement in her symptoms.  He does not report any headaches or blurred vision or double vision. She does not report any syncope or seizures. She did not report any chest pain or shortness of breath.  She does not report any cough, wheezing.  She does not report any hemoptysis. She does not report any nausea, vomiting or abdominal pain. Is not reporting any frequency urgency or hesitancy.  She does not report any skeletal complaints.  She does not report any skin rashes or lesions.  Her remaining review of systems unremarkable.    Medications: I have reviewed the patient's current medications.  Current Outpatient Medications  Medication Sig Dispense Refill  . albuterol (PROVENTIL HFA;VENTOLIN HFA) 108 (90 BASE) MCG/ACT inhaler Inhale 2 puffs into the lungs every 4 (four) hours as needed for  wheezing or shortness of breath.     Marland Kitchen azithromycin (ZITHROMAX) 250 MG tablet     . fluticasone (FLONASE) 50 MCG/ACT nasal spray Place 2 sprays into both nostrils every evening.     Marland Kitchen HYDROcodone-acetaminophen (NORCO/VICODIN) 5-325 MG tablet Take 1-2 tablets by mouth every 6 (six) hours as needed for moderate pain. (Patient taking differently: Take 2 tablets by mouth every evening. ) 25 tablet 0  . loratadine (CLARITIN) 10 MG tablet Take 10 mg by mouth 2 (two) times daily.    Marland Kitchen losartan-hydrochlorothiazide (HYZAAR) 50-12.5 MG per tablet Take 1 tablet by mouth daily.   2  . metFORMIN (GLUCOPHAGE) 500 MG tablet Take 500 mg by mouth 2 (two) times daily with a meal.    . montelukast (SINGULAIR) 10 MG tablet Take 10 mg by mouth every evening.   2  . omeprazole (PRILOSEC) 20 MG capsule Take 20 mg by mouth daily as needed (heartburn).    . prednisoLONE acetate (PRED FORTE) 1 % ophthalmic suspension Place 1 drop into the left eye 3 (three) times daily.   5  . predniSONE (DELTASONE) 10 MG tablet     . simvastatin (ZOCOR) 40 MG tablet Take 40 mg by mouth every evening.    . SYMBICORT 160-4.5 MCG/ACT inhaler Inhale 2 puffs into the lungs every evening.   4  . valACYclovir (VALTREX) 500 MG tablet Take 500 mg by mouth 2 (two) times daily.   0  . Vitamin D, Ergocalciferol, (DRISDOL) 50000 UNITS CAPS Take 50,000 Units by mouth every 14 (fourteen) days.  No current facility-administered medications for this visit.     Allergies:  Allergies  Allergen Reactions  . Aspirin Swelling and Rash    Throat swells.  . Bee Venom Anaphylaxis  . Ibuprofen Shortness Of Breath and Swelling    Throat swells.  Marland Kitchen Percocet [Oxycodone-Acetaminophen] Itching    Past Medical History, Surgical history, Social history, and Family History reviewed and unchanged at this time.    Physical Exam: Blood pressure 127/61, pulse 95, temperature 99 F (37.2 C), temperature source Oral, resp. rate 18, height 5\' 3"  (1.6 m),  weight 201 lb 11.2 oz (91.5 kg), SpO2 96 %. ECOG: 0 General appearance: Well-appearing gentleman without distress. Head: Normocephalic, without obvious abnormality No oral ulcers or thrush. Eyes: No scleral icterus. Lymph nodes: Cervical, supraclavicular, and axillary nodes normal. Heart:regular rate and rhythm, S1, S2 normal, no murmur, click, rub or gallop Lung:chest clear in all lung Christina Bennett, no wheezing, rales, normal symmetric air entry Abdomen: soft, non-tender, without masses or organomegaly  Musculoskeletal: No joint pain or tenderness. Skin: No rashes or lesions.   Lab Results: Lab Results  Component Value Date   WBC 12.8 (H) 09/05/2017   HGB 11.6 09/05/2017   HCT 36.4 09/05/2017   MCV 91.9 09/05/2017   PLT 245 09/05/2017     Chemistry      Component Value Date/Time   NA 136 05/07/2016 0326   NA 141 05/02/2015 0952   K 3.8 05/07/2016 0326   K 4.2 05/02/2015 0952   CL 101 05/07/2016 0326   CL 104 04/23/2012 1017   CO2 23 05/07/2016 0326   CO2 28 05/02/2015 0952   BUN 22 (H) 05/07/2016 0326   BUN 13.0 05/02/2015 0952   CREATININE 1.06 (H) 05/07/2016 0326   CREATININE 0.7 05/02/2015 0952      Component Value Date/Time   CALCIUM 9.2 05/07/2016 0326   CALCIUM 9.4 05/02/2015 0952   ALKPHOS 81 05/07/2016 0326   ALKPHOS 82 05/02/2015 0952   AST 18 05/07/2016 0326   AST 12 05/02/2015 0952   ALT 9 (L) 05/07/2016 0326   ALT 7 05/02/2015 0952   BILITOT 0.8 05/07/2016 0326   BILITOT 0.38 05/02/2015 0952      Results for Christina, Bennett (MRN [id number]) as of 09/05/2017 10:23  Ref. Range 03/05/2017 08:56  Iron Latest Ref Range: 41 - 142 ug/dL 39 (L)  UIBC Latest Ref Range: 120 - 384 ug/dL 319  TIBC Latest Ref Range: 236 - 444 ug/dL 359  %SAT Latest Ref Range: 21 - 57 % 11 (L)  Ferritin Latest Ref Range: 9 - 269 ng/ml 26       Impression and Plan:  77 year old woman with the following issues:   1.  Iron deficiency diagnosed in 2013.  The etiology is related  to poor oral iron absorption.   She is currently receiving intravenous iron as needed over the last treatment in July 2018.  Iron studies at the time showed an iron level of 38 and ferritin of 26.  Her hemoglobin today is normal and iron studies are currently pending.  If she requires repeat iron she is willing to proceed with.  Complication associated with this therapy was reviewed again and she has not experienced any difficulties.  2. Colon cancer screening: She is up-to-date with her last colonoscopy in 2017.  No GI symptoms noted.  3. Leukocytosis.  Reactive in nature and related to her COPD.  No evidence of a lymphoproliferative disorder.  4.  Somnolence: I do  not think this is related to her anemia given that her hemoglobin is normal at this time.  I recommended a pulmonary evaluation including ruling out sleep apnea.  5. Followup: Will be in 6 months to repeat iron studies.  15  minutes was spent with the patient face-to-face today.  More than 50% of time was dedicated to patient counseling, education and answering her questions.   Roxy Cedar Akiera Allbaugh 1/25/201910:28 AM

## 2017-09-15 DIAGNOSIS — E782 Mixed hyperlipidemia: Secondary | ICD-10-CM | POA: Diagnosis not present

## 2017-09-15 DIAGNOSIS — I10 Essential (primary) hypertension: Secondary | ICD-10-CM | POA: Diagnosis not present

## 2017-09-15 DIAGNOSIS — Z1389 Encounter for screening for other disorder: Secondary | ICD-10-CM | POA: Diagnosis not present

## 2017-09-15 DIAGNOSIS — N183 Chronic kidney disease, stage 3 (moderate): Secondary | ICD-10-CM | POA: Diagnosis not present

## 2017-09-15 DIAGNOSIS — K219 Gastro-esophageal reflux disease without esophagitis: Secondary | ICD-10-CM | POA: Diagnosis not present

## 2017-09-15 DIAGNOSIS — E1122 Type 2 diabetes mellitus with diabetic chronic kidney disease: Secondary | ICD-10-CM | POA: Diagnosis not present

## 2017-09-15 DIAGNOSIS — J449 Chronic obstructive pulmonary disease, unspecified: Secondary | ICD-10-CM | POA: Diagnosis not present

## 2017-09-15 DIAGNOSIS — R0609 Other forms of dyspnea: Secondary | ICD-10-CM | POA: Diagnosis not present

## 2017-09-15 DIAGNOSIS — G8929 Other chronic pain: Secondary | ICD-10-CM | POA: Diagnosis not present

## 2017-09-17 DIAGNOSIS — H02052 Trichiasis without entropian right lower eyelid: Secondary | ICD-10-CM | POA: Diagnosis not present

## 2017-09-17 DIAGNOSIS — B0052 Herpesviral keratitis: Secondary | ICD-10-CM | POA: Diagnosis not present

## 2017-09-17 DIAGNOSIS — H17822 Peripheral opacity of cornea, left eye: Secondary | ICD-10-CM | POA: Diagnosis not present

## 2017-09-17 DIAGNOSIS — H1712 Central corneal opacity, left eye: Secondary | ICD-10-CM | POA: Diagnosis not present

## 2017-09-17 DIAGNOSIS — Z961 Presence of intraocular lens: Secondary | ICD-10-CM | POA: Diagnosis not present

## 2017-09-17 DIAGNOSIS — H02055 Trichiasis without entropian left lower eyelid: Secondary | ICD-10-CM | POA: Diagnosis not present

## 2017-10-14 DIAGNOSIS — N183 Chronic kidney disease, stage 3 (moderate): Secondary | ICD-10-CM | POA: Diagnosis not present

## 2017-10-14 DIAGNOSIS — I10 Essential (primary) hypertension: Secondary | ICD-10-CM | POA: Diagnosis not present

## 2017-10-14 DIAGNOSIS — G894 Chronic pain syndrome: Secondary | ICD-10-CM | POA: Diagnosis not present

## 2017-10-14 NOTE — Progress Notes (Signed)
Cardiology Office Note    Date:  10/15/2017   ID:  [name], [name] 1941/01/25, MRN [id number]  PCP:  Wenda Low, MD  Cardiologist: Sinclair Grooms, MD   Chief Complaint  Patient presents with  . Shortness of Breath  . Chest Pain    History of Present Illness:  Christina Bennett is a 77 y.o. female who presents ith non-obstructive CAD (50% LAD 78295), diabetes mellitus, COPD who presents for.   Has noted episodes of left chest and back discomfort.  Episodes seem to occur with activity.  Also even more concerning is excessive fatigue, increased daytime sleepiness, and she has a history of snoring.  No prior evaluation for sleep apnea.  Prior ischemic evaluations have been unremarkable.  His most recent   Past Medical History:  Diagnosis Date  . Anemia    iron insufion on 05/2016   . Atypical chest pain    Atypical CP--stress test, cor angio, and CT chest negative in 2005. Nuclear study normal in 2011   . COPD (chronic obstructive pulmonary disease) ([place])   . Diabetes ([place])    Diabetes type 2- 1/13- metformin started  . Dyslipidemia   . Endometriosis    with ovarian radiation in 1966, gyn Dr cousins  . GERD (gastroesophageal reflux disease)    H/H  . Herpes    herpes of the left eye, Dr Patrice Paradise optho  . History of DVT (deep vein thrombosis)    on OCP  . History of kidney stones   . Hypertension   . Mild anemia    WBC mild high, lab 2012/ HEMATOLOGY   . Osteoarthritis    Osteoarthritis of the hip, knee, and hand, vicodin , prn  . Osteopenia    BD in 2013  . Tobacco use    quit 03/2008  . Vitamin D deficiency     Past Surgical History:  Procedure Laterality Date  . APPENDECTOMY    . BALLOON DILATION N/A 07/09/2016   Procedure: BALLOON DILATION;  Surgeon: Garlan Fair, MD;  Location: [facility];  Service: Endoscopy;  Laterality: N/A;  . COLONOSCOPY WITH PROPOFOL N/A 07/09/2016   Procedure: COLONOSCOPY WITH PROPOFOL;  Surgeon: Garlan Fair, MD;   Location: [facility];  Service: Endoscopy;  Laterality: N/A;  . ESOPHAGOGASTRODUODENOSCOPY N/A 07/09/2016   Procedure: ESOPHAGOGASTRODUODENOSCOPY (EGD);  Surgeon: Garlan Fair, MD;  Location: [facility];  Service: Endoscopy;  Laterality: N/A;  . EYE SURGERY     left eye cataract srugery   . HERNIA REPAIR    . surgery fro endometriosis    . TONSILLECTOMY      Current Medications: Outpatient Medications Prior to Visit  Medication Sig Dispense Refill  . albuterol (PROVENTIL HFA;VENTOLIN HFA) 108 (90 BASE) MCG/ACT inhaler Inhale 2 puffs into the lungs every 4 (four) hours as needed for wheezing or shortness of breath.     . fluticasone (FLONASE) 50 MCG/ACT nasal spray Place 2 sprays into both nostrils every evening.     Marland Kitchen HYDROcodone-acetaminophen (NORCO/VICODIN) 5-325 MG tablet Take 1 tablet by mouth 2 (two) times daily as needed for moderate pain.    Marland Kitchen losartan-hydrochlorothiazide (HYZAAR) 100-25 MG tablet Take 1 tablet by mouth daily.    . montelukast (SINGULAIR) 10 MG tablet Take 10 mg by mouth every evening.   2  . omeprazole (PRILOSEC) 20 MG capsule Take 20 mg by mouth daily.     . simvastatin (ZOCOR) 40 MG tablet Take 40 mg by mouth every  evening.    . SYMBICORT 160-4.5 MCG/ACT inhaler Inhale 2 puffs into the lungs every evening.   4  . valACYclovir (VALTREX) 500 MG tablet Take 500 mg by mouth 2 (two) times daily.   0  . Vitamin D, Ergocalciferol, (DRISDOL) 50000 UNITS CAPS Take 50,000 Units by mouth every 14 (fourteen) days.    Marland Kitchen azithromycin (ZITHROMAX) 250 MG tablet     . HYDROcodone-acetaminophen (NORCO/VICODIN) 5-325 MG tablet Take 1-2 tablets by mouth every 6 (six) hours as needed for moderate pain. (Patient not taking: Reported on 10/15/2017) 25 tablet 0  . loratadine (CLARITIN) 10 MG tablet Take 10 mg by mouth 2 (two) times daily.    Marland Kitchen losartan-hydrochlorothiazide (HYZAAR) 50-12.5 MG per tablet Take 1 tablet by mouth daily.   2  . metFORMIN (GLUCOPHAGE) 500 MG tablet Take  500 mg by mouth 2 (two) times daily with a meal.    . prednisoLONE acetate (PRED FORTE) 1 % ophthalmic suspension Place 1 drop into the left eye 3 (three) times daily.   5  . predniSONE (DELTASONE) 10 MG tablet      No facility-administered medications prior to visit.      Allergies:   Aspirin; Bee venom; Ibuprofen; and Percocet [oxycodone-acetaminophen]   Social History   Socioeconomic History  . Marital status: Single    Spouse name: None  . Number of children: None  . Years of education: None  . Highest education level: None  Social Needs  . Financial resource strain: None  . Food insecurity - worry: None  . Food insecurity - inability: None  . Transportation needs - medical: None  . Transportation needs - non-medical: None  Occupational History  . None  Tobacco Use  . Smoking status: Former Research scientist (life sciences)  . Smokeless tobacco: Never Used  Substance and Sexual Activity  . Alcohol use: No  . Drug use: No  . Sexual activity: None  Other Topics Concern  . None  Social History Narrative  . None     Family History:  The patient's family history includes Heart disease in her brother and brother; Hypertension in her mother.   ROS:   Please see the history of present illness.    Tired all the time.  States her sister notices that she does snore but does not complaint.  Back pain. All other systems reviewed and are negative.   PHYSICAL EXAM:   VS:  BP 112/62   Pulse 94   Ht 5\' 3"  (1.6 m)   Wt 204 lb (92.5 kg)   BMI 36.14 kg/m    GEN: Well nourished, well developed, in no acute distress.  New morbid obesity. HEENT: normal  Neck: no JVD, carotid bruits, or masses Cardiac: RRR; no murmurs, rubs, or gallops,no edema  Respiratory:  clear to auscultation bilaterally, normal work of breathing GI: soft, nontender, nondistended, + BS MS: no deformity or atrophy  Skin: warm and dry, no rash Neuro:  Alert and Oriented x 3, Strength and sensation are intact Psych: euthymic mood,  full affect  Wt Readings from Last 3 Encounters:  10/15/17 204 lb (92.5 kg)  09/05/17 201 lb 11.2 oz (91.5 kg)  03/05/17 203 lb 4.8 oz (92.2 kg)      Studies/Labs Reviewed:   EKG:  EKG sinus rhythm, nonspecific T wave flattening and ST-T abnormality.  When compared to prior tracings from 2016, no significant change has occurred.  Recent Labs: 09/05/2017: Hemoglobin 11.6; Platelets 245   Lipid Panel No results found for:  CHOL, TRIG, HDL, CHOLHDL, VLDL, LDLCALC, LDLDIRECT  Additional studies/ records that were reviewed today include:  Chest CT performed in 2005 did not reveal any evidence of coronary calcification.    ASSESSMENT:    1. Coronary artery disease of native artery of native heart with stable angina pectoris ([place])   2. Mixed hyperlipidemia   3. Type 2 diabetes mellitus with complication, without long-term current use of insulin ([place])   4. Snoring      PLAN:  In order of problems listed above:  1. Chest discomfort with atypical features.  Repeat myocardial perfusion imaging with Lexiscan stress to rule out myocardial ischemia. 2. LDL target less than 70. 3. Hemoglobin A1c less than 7. 4. Likely needs a sleep study.  May help explain dyspnea and excessive daytime sleepiness.  PRN follow-up of significant abnormalities noted on nuclear scintigraphy   Medication Adjustments/Labs and Tests Ordered: Current medicines are reviewed at length with the patient today.  Concerns regarding medicines are outlined above.  Medication changes, Labs and Tests ordered today are listed in the Patient Instructions below. There are no Patient Instructions on file for this visit.   Signed, Sinclair Grooms, MD  10/15/2017 3:57 PM    [facility] [place], [place], [place]  [address] Phone: [phone]; Fax: [phone]

## 2017-10-15 ENCOUNTER — Ambulatory Visit (INDEPENDENT_AMBULATORY_CARE_PROVIDER_SITE_OTHER): Payer: Medicare Other | Admitting: Interventional Cardiology

## 2017-10-15 ENCOUNTER — Encounter: Payer: Self-pay | Admitting: Interventional Cardiology

## 2017-10-15 ENCOUNTER — Encounter (INDEPENDENT_AMBULATORY_CARE_PROVIDER_SITE_OTHER): Payer: Self-pay

## 2017-10-15 VITALS — BP 112/62 | HR 94 | Ht 63.0 in | Wt 204.0 lb

## 2017-10-15 DIAGNOSIS — E118 Type 2 diabetes mellitus with unspecified complications: Secondary | ICD-10-CM

## 2017-10-15 DIAGNOSIS — R0683 Snoring: Secondary | ICD-10-CM | POA: Diagnosis not present

## 2017-10-15 DIAGNOSIS — E782 Mixed hyperlipidemia: Secondary | ICD-10-CM

## 2017-10-15 DIAGNOSIS — R0602 Shortness of breath: Secondary | ICD-10-CM | POA: Diagnosis not present

## 2017-10-15 DIAGNOSIS — I25118 Atherosclerotic heart disease of native coronary artery with other forms of angina pectoris: Secondary | ICD-10-CM | POA: Diagnosis not present

## 2017-10-15 NOTE — Patient Instructions (Signed)
Medication Instructions:  Your physician recommends that you continue on your current medications as directed. Please refer to the Current Medication list given to you today.  Labwork: None  Testing/Procedures: Your physician has requested that you have a lexiscan myoview. For further information please visit [email or web address]. Please follow instruction sheet, as given.   Follow-Up: Your physician recommends that you schedule a follow-up appointment as needed with Dr. Smith.    Any Other Special Instructions Will Be Listed Below (If Applicable).     If you need a refill on your cardiac medications before your next appointment, please call your pharmacy.   

## 2017-10-20 ENCOUNTER — Telehealth (HOSPITAL_COMMUNITY): Payer: Self-pay | Admitting: *Deleted

## 2017-10-20 NOTE — Telephone Encounter (Signed)
Patient given detailed instructions per Myocardial Perfusion Study Information Sheet for the test on 10/23/17 at 0945. Patient notified to arrive 15 minutes early and that it is imperative to arrive on time for appointment to keep from having the test rescheduled.  If you need to cancel or reschedule your appointment, please call the office within 24 hours of your appointment. . Patient verbalized understanding.Christina Bennett, Ranae Palms

## 2017-10-23 ENCOUNTER — Ambulatory Visit (HOSPITAL_COMMUNITY): Payer: Medicare Other | Attending: Internal Medicine

## 2017-10-23 DIAGNOSIS — R0602 Shortness of breath: Secondary | ICD-10-CM | POA: Insufficient documentation

## 2017-10-23 LAB — MYOCARDIAL PERFUSION IMAGING
LV dias vol: 74 mL (ref 46–106)
LV sys vol: 29 mL
Peak HR: 100 {beats}/min
RATE: 0.3
Rest HR: 83 {beats}/min
SDS: 1
SRS: 2
SSS: 3
TID: 0.81

## 2017-10-23 MED ORDER — TECHNETIUM TC 99M TETROFOSMIN IV KIT
31.0000 | PACK | Freq: Once | INTRAVENOUS | Status: AC | PRN
  Administered 2017-10-23: 31 via INTRAVENOUS
  Filled 2017-10-23: qty 31

## 2017-10-23 MED ORDER — REGADENOSON 0.4 MG/5ML IV SOLN
0.4000 mg | Freq: Once | INTRAVENOUS | Status: AC
  Administered 2017-10-23: 0.4 mg via INTRAVENOUS

## 2017-10-23 MED ORDER — TECHNETIUM TC 99M TETROFOSMIN IV KIT
10.5000 | PACK | Freq: Once | INTRAVENOUS | Status: AC | PRN
  Administered 2017-10-23: 10.5 via INTRAVENOUS
  Filled 2017-10-23: qty 11

## 2017-12-16 DIAGNOSIS — N183 Chronic kidney disease, stage 3 (moderate): Secondary | ICD-10-CM | POA: Diagnosis not present

## 2018-01-12 DIAGNOSIS — H35352 Cystoid macular degeneration, left eye: Secondary | ICD-10-CM | POA: Diagnosis not present

## 2018-01-12 DIAGNOSIS — H1712 Central corneal opacity, left eye: Secondary | ICD-10-CM | POA: Diagnosis not present

## 2018-01-12 DIAGNOSIS — B0052 Herpesviral keratitis: Secondary | ICD-10-CM | POA: Diagnosis not present

## 2018-01-12 DIAGNOSIS — H2513 Age-related nuclear cataract, bilateral: Secondary | ICD-10-CM | POA: Diagnosis not present

## 2018-01-13 DIAGNOSIS — Z961 Presence of intraocular lens: Secondary | ICD-10-CM | POA: Diagnosis not present

## 2018-01-13 DIAGNOSIS — H43812 Vitreous degeneration, left eye: Secondary | ICD-10-CM | POA: Diagnosis not present

## 2018-01-13 DIAGNOSIS — H35352 Cystoid macular degeneration, left eye: Secondary | ICD-10-CM | POA: Diagnosis not present

## 2018-01-13 DIAGNOSIS — H35072 Retinal telangiectasis, left eye: Secondary | ICD-10-CM | POA: Diagnosis not present

## 2018-01-16 DIAGNOSIS — H35352 Cystoid macular degeneration, left eye: Secondary | ICD-10-CM | POA: Diagnosis not present

## 2018-01-16 DIAGNOSIS — H35072 Retinal telangiectasis, left eye: Secondary | ICD-10-CM | POA: Diagnosis not present

## 2018-01-16 DIAGNOSIS — H43812 Vitreous degeneration, left eye: Secondary | ICD-10-CM | POA: Diagnosis not present

## 2018-01-16 DIAGNOSIS — Z961 Presence of intraocular lens: Secondary | ICD-10-CM | POA: Diagnosis not present

## 2018-03-03 ENCOUNTER — Inpatient Hospital Stay: Payer: Medicare Other | Attending: Oncology | Admitting: Oncology

## 2018-03-03 ENCOUNTER — Inpatient Hospital Stay: Payer: Medicare Other

## 2018-03-03 ENCOUNTER — Telehealth: Payer: Self-pay | Admitting: Oncology

## 2018-03-03 VITALS — BP 118/64 | HR 82 | Temp 97.6°F | Resp 16 | Ht 63.0 in | Wt 200.4 lb

## 2018-03-03 DIAGNOSIS — D72829 Elevated white blood cell count, unspecified: Secondary | ICD-10-CM

## 2018-03-03 DIAGNOSIS — D509 Iron deficiency anemia, unspecified: Secondary | ICD-10-CM | POA: Diagnosis not present

## 2018-03-03 DIAGNOSIS — I25118 Atherosclerotic heart disease of native coronary artery with other forms of angina pectoris: Secondary | ICD-10-CM | POA: Diagnosis not present

## 2018-03-03 LAB — CBC WITH DIFFERENTIAL (CANCER CENTER ONLY)
Basophils Absolute: 0.1 10*3/uL (ref 0.0–0.1)
Basophils Relative: 1 %
Eosinophils Absolute: 0.4 10*3/uL (ref 0.0–0.5)
Eosinophils Relative: 3 %
HCT: 34.2 % — ABNORMAL LOW (ref 34.8–46.6)
Hemoglobin: 10.6 g/dL — ABNORMAL LOW (ref 11.6–15.9)
Lymphocytes Relative: 14 %
Lymphs Abs: 1.8 10*3/uL (ref 0.9–3.3)
MCH: 26.7 pg (ref 25.1–34.0)
MCHC: 31.1 g/dL — ABNORMAL LOW (ref 31.5–36.0)
MCV: 85.8 fL (ref 79.5–101.0)
Monocytes Absolute: 0.9 10*3/uL (ref 0.1–0.9)
Monocytes Relative: 7 %
Neutro Abs: 10.1 10*3/uL — ABNORMAL HIGH (ref 1.5–6.5)
Neutrophils Relative %: 75 %
Platelet Count: 250 10*3/uL (ref 145–400)
RBC: 3.98 MIL/uL (ref 3.70–5.45)
RDW: 16.2 % — ABNORMAL HIGH (ref 11.2–14.5)
WBC Count: 13.2 10*3/uL — ABNORMAL HIGH (ref 3.9–10.3)

## 2018-03-03 LAB — IRON AND TIBC
Iron: 22 ug/dL — ABNORMAL LOW (ref 41–142)
Saturation Ratios: 7 % — ABNORMAL LOW (ref 21–57)
TIBC: 324 ug/dL (ref 236–444)
UIBC: 302 ug/dL

## 2018-03-03 LAB — FERRITIN: Ferritin: 36 ng/mL (ref 11–307)

## 2018-03-03 NOTE — Telephone Encounter (Signed)
Gave patient avs and calendar of upcoming appts.  °

## 2018-03-03 NOTE — Progress Notes (Signed)
Hematology and Oncology Follow Up Visit  Christina Bennett [id number] 1940/08/18 77 y.o. 03/03/2018 9:58 AM    Principle Diagnosis: 77 year old with iron deficiency anemia diagnosed in September 2013.  Her iron deficiency is related to poor absorption with negative GI work-up.  g.  Prior therapy: IV iron in the form of Feraheme of total 1000 mg given intermittently.   Current therapy:  Repeat IV iron as needed.  Interim History: Christina Bennett is here for a follow-up visit.  Since the last visit, she reports no major changes or concerns.  She denies any excessive fatigue or tiredness.  He denies any hematochezia or melena.  She remains active and attends to activities of daily living.  She continues to have visual problems which have been chronic in nature.  She denies any complications in the past related to intravenous iron with improvement in her symptoms.  Her last IV iron infusion in August 2018 benefited her symptoms at the time.   He does not report any headaches or blurred vision or double vision. She does not report any alteration mental status or confusion.  She denies any fevers or chills or sweats. She did not report any chest pain or shortness of breath.  She does not report any cough, wheezing.  She does not report any hemoptysis. She does not report any nausea, vomiting or abdominal pain.  She does not report any constipation or diarrhea.  Is not reporting any frequency urgency or hesitancy.  She does not report any arthralgias or myalgias.  She does not report any skin rashes or lesions.  She does not report any bleeding clotting tendencies.  Her remaining review of systems is negative.   Medications: I have reviewed the patient's current medications.  Current Outpatient Medications  Medication Sig Dispense Refill  . albuterol (PROVENTIL HFA;VENTOLIN HFA) 108 (90 BASE) MCG/ACT inhaler Inhale 2 puffs into the lungs every 4 (four) hours as needed for wheezing or shortness of breath.      . fluticasone (FLONASE) 50 MCG/ACT nasal spray Place 2 sprays into both nostrils every evening.     Marland Kitchen HYDROcodone-acetaminophen (NORCO/VICODIN) 5-325 MG tablet Take 1 tablet by mouth 2 (two) times daily as needed for moderate pain.    Marland Kitchen losartan-hydrochlorothiazide (HYZAAR) 100-25 MG tablet Take 1 tablet by mouth daily.    . montelukast (SINGULAIR) 10 MG tablet Take 10 mg by mouth every evening.   2  . omeprazole (PRILOSEC) 20 MG capsule Take 20 mg by mouth daily.     . simvastatin (ZOCOR) 40 MG tablet Take 40 mg by mouth every evening.    . SYMBICORT 160-4.5 MCG/ACT inhaler Inhale 2 puffs into the lungs every evening.   4  . valACYclovir (VALTREX) 500 MG tablet Take 500 mg by mouth 2 (two) times daily.   0  . Vitamin D, Ergocalciferol, (DRISDOL) 50000 UNITS CAPS Take 50,000 Units by mouth every 14 (fourteen) days.     No current facility-administered medications for this visit.     Allergies:  Allergies  Allergen Reactions  . Aspirin Swelling and Rash    Throat swells.  . Bee Venom Anaphylaxis  . Ibuprofen Shortness Of Breath and Swelling    Throat swells.  Marland Kitchen Percocet [Oxycodone-Acetaminophen] Itching    Past Medical History, Surgical history, Social history, and Family History reviewed and unchanged at this time.    Physical Exam:  ECOG: 0 General appearance: Alert, awake woman without distress. Head: Atraumatic without abnormalities. Eyes: Pupils are equal and  round reactive to light. Lymph nodes: No lymphadenopathy noted in the cervical, supraclavicular, or axillary nodes  Heart:regular rate and rhythm, without any murmurs or gallops. Lung: Clear to auscultation without any wheezes or dullness to percussion. Abdomen: soft, any rebound or guarding.  No shifting dullness or ascites. Musculoskeletal: No musculoskeletal complaints including arthralgias or myalgias. Skin: No ecchymosis or petechiae.   Lab Results: Lab Results  Component Value Date   WBC 12.8 (H)  09/05/2017   HGB 11.6 09/05/2017   HCT 36.4 09/05/2017   MCV 91.9 09/05/2017   PLT 245 09/05/2017     Chemistry      Component Value Date/Time   NA 136 05/07/2016 0326   NA 141 05/02/2015 0952   K 3.8 05/07/2016 0326   K 4.2 05/02/2015 0952   CL 101 05/07/2016 0326   CL 104 04/23/2012 1017   CO2 23 05/07/2016 0326   CO2 28 05/02/2015 0952   BUN 22 (H) 05/07/2016 0326   BUN 13.0 05/02/2015 0952   CREATININE 1.06 (H) 05/07/2016 0326   CREATININE 0.7 05/02/2015 0952      Component Value Date/Time   CALCIUM 9.2 05/07/2016 0326   CALCIUM 9.4 05/02/2015 0952   ALKPHOS 81 05/07/2016 0326   ALKPHOS 82 05/02/2015 0952   AST 18 05/07/2016 0326   AST 12 05/02/2015 0952   ALT 9 (L) 05/07/2016 0326   ALT 7 05/02/2015 0952   BILITOT 0.8 05/07/2016 0326   BILITOT 0.38 05/02/2015 0952        Results for Christina Bennett (MRN [id number]) as of 03/03/2018 10:10  Ref. Range 09/05/2017 09:51  Iron Latest Ref Range: 41 - 142 ug/dL 42  UIBC Latest Units: ug/dL 244  TIBC Latest Ref Range: 236 - 444 ug/dL 286  Saturation Ratios Latest Ref Range: 21 - 57 % 15 (L)  Ferritin Latest Ref Range: 9 - 269 ng/mL 73      Impression and Plan:  77 year old woman with the following issues:   1.  Iron deficiency related to poor oral iron absorption documented in 2013.  She has been receiving intermittent intravenous iron infusion about once a year.  Her laboratory data from today were reviewed which showed a hemoglobin of 10.6 and elevated RDW indicating iron deficiency.  Her iron studies are currently pending and will likely document iron deficiency.  Risks and benefits of proceeding with intravenous iron was reviewed today and she is agreeable to proceed if her iron is indeed low.  2. Colon cancer screening: No GI symptoms to suggest colorectal bleeding.  Is up-to-date on colonoscopy.  3. Leukocytosis.  White cell count remains mildly elevated and appears to be reactive in nature.   4.  Followup: Will be in 6 months for a follow-up on repeat iron studies.  15  minutes was spent with the patient face-to-face today.  More than 50% of time was dedicated to patient counseling, education and coordinating her future plan of care.   Zola Button 7/23/20199:58 AM

## 2018-03-11 ENCOUNTER — Inpatient Hospital Stay: Payer: Medicare Other

## 2018-03-11 VITALS — BP 117/52 | HR 86 | Temp 98.4°F | Resp 20 | Ht 63.0 in | Wt 199.9 lb

## 2018-03-11 DIAGNOSIS — D509 Iron deficiency anemia, unspecified: Secondary | ICD-10-CM | POA: Diagnosis not present

## 2018-03-11 DIAGNOSIS — D72829 Elevated white blood cell count, unspecified: Secondary | ICD-10-CM | POA: Diagnosis not present

## 2018-03-11 DIAGNOSIS — D500 Iron deficiency anemia secondary to blood loss (chronic): Secondary | ICD-10-CM

## 2018-03-11 MED ORDER — SODIUM CHLORIDE 0.9% FLUSH
10.0000 mL | INTRAVENOUS | Status: DC | PRN
  Filled 2018-03-11: qty 10

## 2018-03-11 MED ORDER — SODIUM CHLORIDE 0.9 % IV SOLN
Freq: Once | INTRAVENOUS | Status: AC
  Administered 2018-03-11: 11:00:00 via INTRAVENOUS
  Filled 2018-03-11: qty 250

## 2018-03-11 MED ORDER — SODIUM CHLORIDE 0.9 % IV SOLN
510.0000 mg | Freq: Once | INTRAVENOUS | Status: AC
  Administered 2018-03-11: 510 mg via INTRAVENOUS
  Filled 2018-03-11: qty 17

## 2018-03-11 NOTE — Patient Instructions (Signed)

## 2018-03-11 NOTE — Progress Notes (Signed)
Pt tolerated infusion well, no problems or concerns noted, IV removed tip intact, Pt. Left clinic alert, oriented and ambulatory. Geannie Risen

## 2018-03-18 ENCOUNTER — Inpatient Hospital Stay: Payer: Medicare Other | Attending: Oncology

## 2018-03-18 VITALS — BP 117/56 | HR 85 | Temp 98.4°F | Resp 20

## 2018-03-18 DIAGNOSIS — D509 Iron deficiency anemia, unspecified: Secondary | ICD-10-CM | POA: Diagnosis not present

## 2018-03-18 DIAGNOSIS — D500 Iron deficiency anemia secondary to blood loss (chronic): Secondary | ICD-10-CM

## 2018-03-18 MED ORDER — SODIUM CHLORIDE 0.9% FLUSH
10.0000 mL | INTRAVENOUS | Status: DC | PRN
  Filled 2018-03-18: qty 10

## 2018-03-18 MED ORDER — SODIUM CHLORIDE 0.9 % IV SOLN
Freq: Once | INTRAVENOUS | Status: AC
  Administered 2018-03-18: 10:00:00 via INTRAVENOUS
  Filled 2018-03-18: qty 250

## 2018-03-18 MED ORDER — SODIUM CHLORIDE 0.9 % IV SOLN
510.0000 mg | Freq: Once | INTRAVENOUS | Status: AC
  Administered 2018-03-18: 510 mg via INTRAVENOUS
  Filled 2018-03-18: qty 17

## 2018-03-18 NOTE — Patient Instructions (Signed)

## 2018-04-19 IMAGING — CT CT RENAL STONE PROTOCOL
2 of 3 series · 15 of 46 positions shown, 17 images · non-contrast
Comparison: CT of the abdomen and pelvis performed 04/22/2005

CLINICAL DATA: Acute onset of left flank pain.  Initial encounter.

EXAM:
CT ABDOMEN AND PELVIS WITHOUT CONTRAST
TECHNIQUE: Multidetector CT imaging of the abdomen and pelvis was performed
following the standard protocol without IV contrast.

[Series 3: lung · axial · 0.81mm/px · z∈[-40,+30]mm · 12 of 41 slices shown, 14 images]
[im 3/41  soft-tissue]
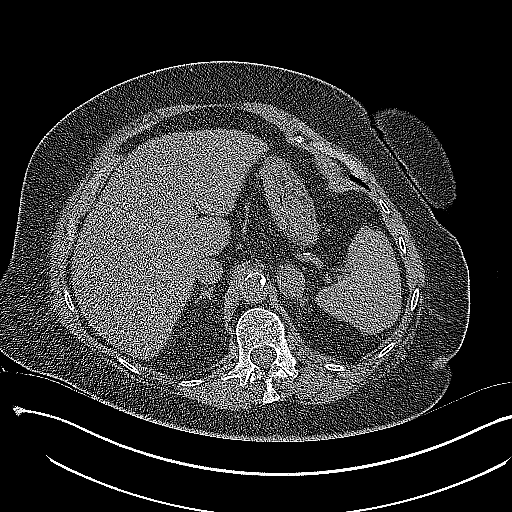
[im 3/41  bone]
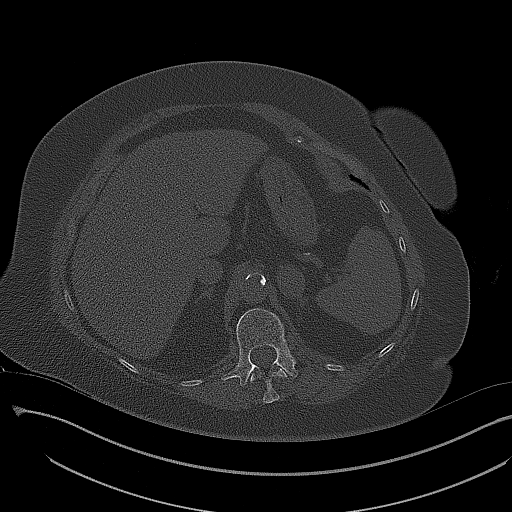
[im 6/41  soft-tissue]
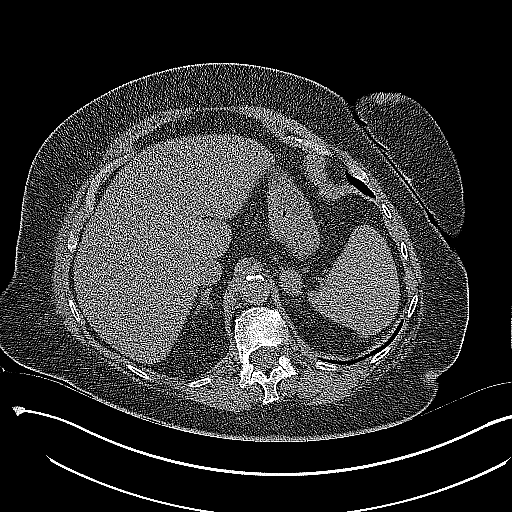
[im 10/41  soft-tissue]
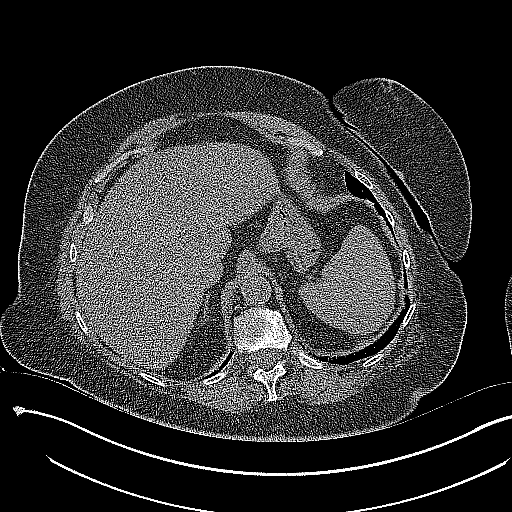
[im 12/41  soft-tissue]
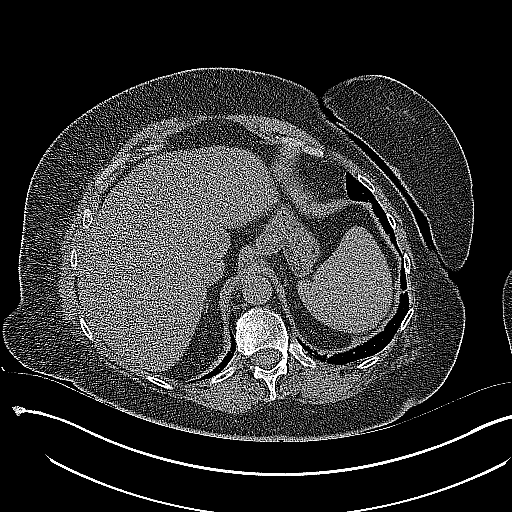
[im 16/41  soft-tissue]
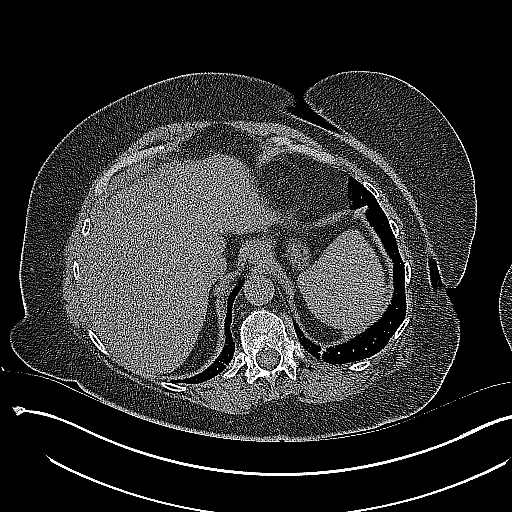
[im 19/41  soft-tissue]
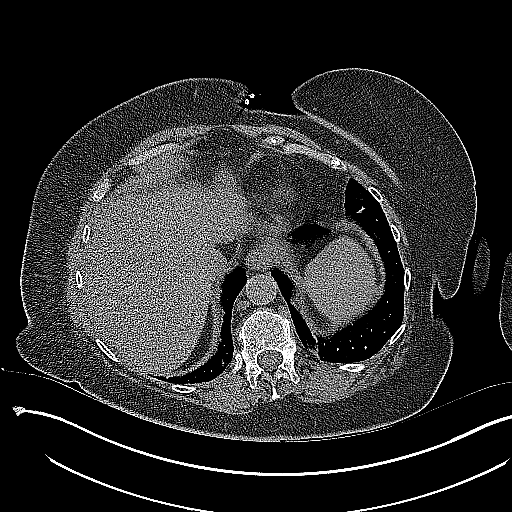
[im 22/41  soft-tissue]
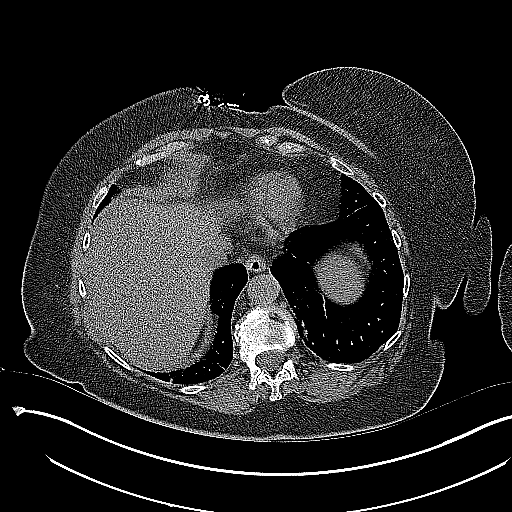
[im 25/41  soft-tissue]
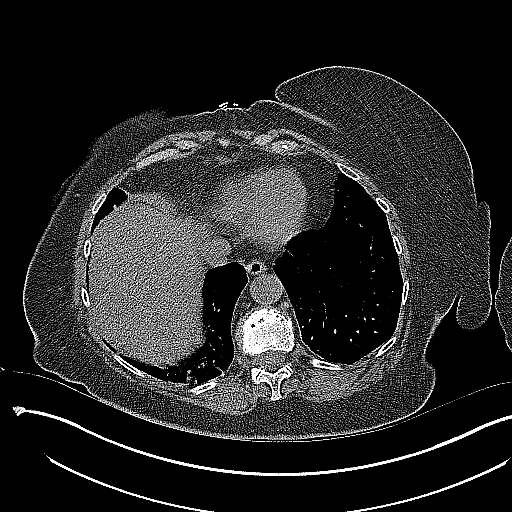
[im 29/41  soft-tissue]
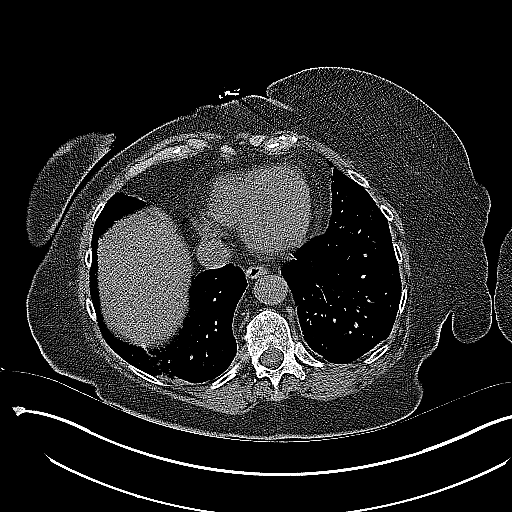
[im 29/41  bone]
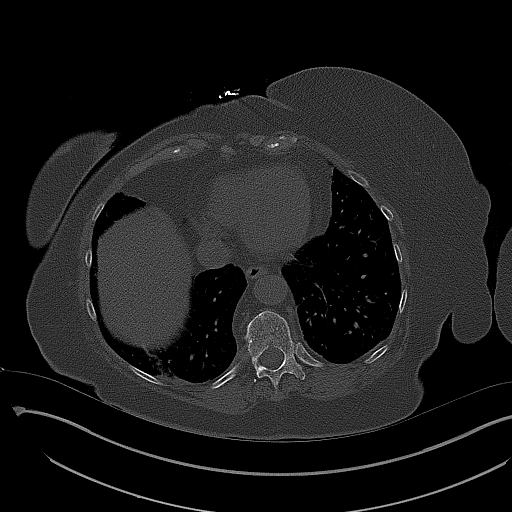
[im 31/41  soft-tissue]
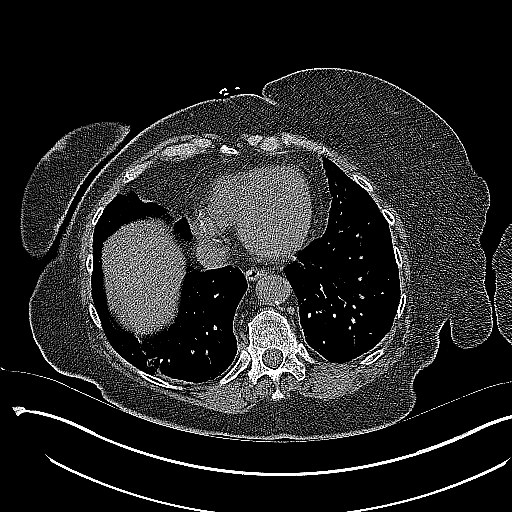
[im 35/41  soft-tissue]
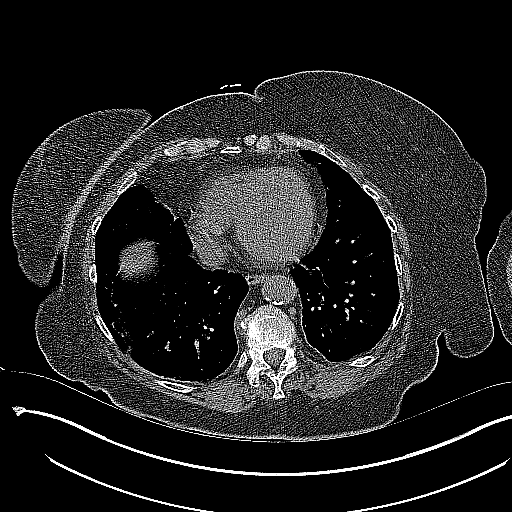
[im 38/41  soft-tissue]
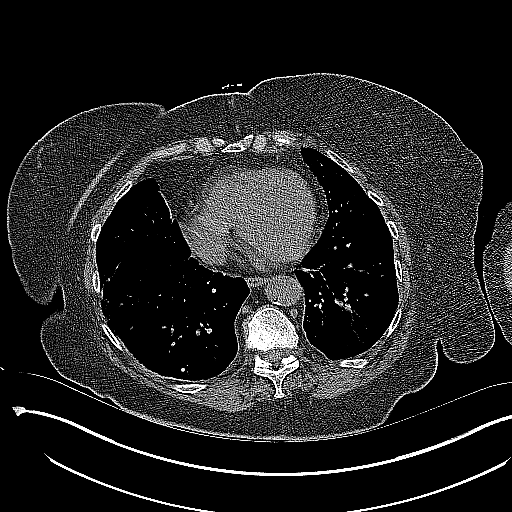

[Series 4: coronal · coronal · 0.75mm/px · 3 of 155 slices shown]
[im 52/155  soft-tissue]
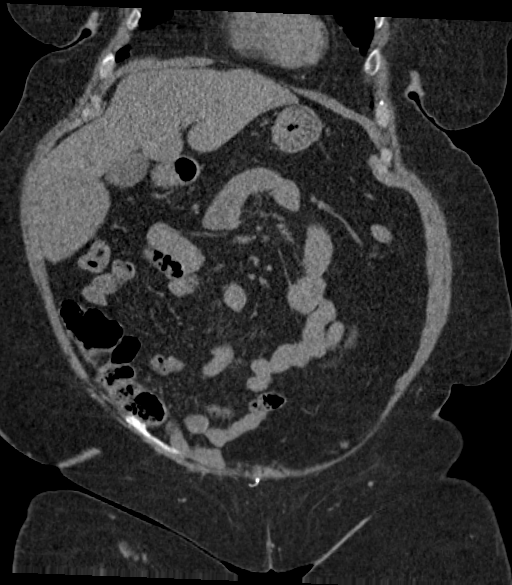
[im 69/155  soft-tissue]
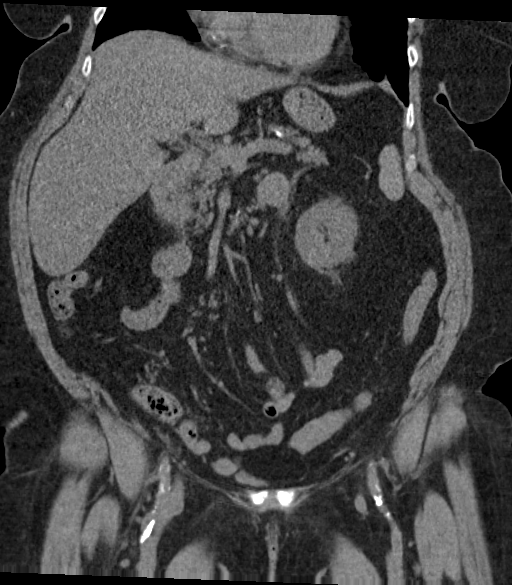
[im 86/155  soft-tissue]
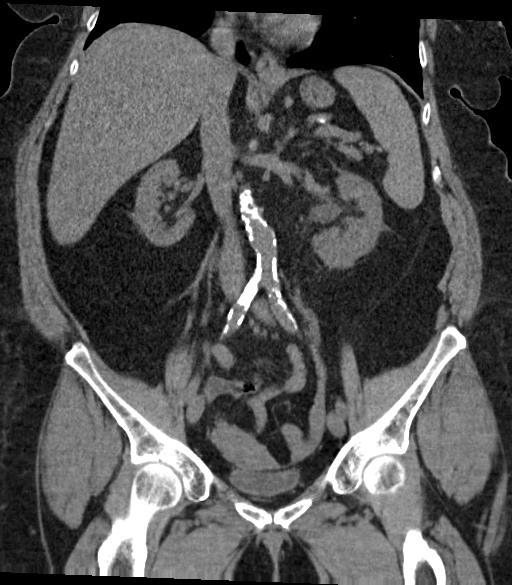

[15 of 46 positions shown; findings below may reference images not displayed]

FINDINGS: Lower chest: Mild bibasilar atelectasis or scarring is noted. Mild
coronary artery calcification is noted.

Hepatobiliary: The liver is unremarkable in appearance. The
gallbladder is unremarkable in appearance. The common bile duct
remains normal in caliber.

Pancreas: The pancreas is within normal limits.

Spleen: The spleen is unremarkable in appearance.

Adrenals/Urinary Tract: The patient's left adrenal mass has
increased in size to 2.9 cm. This is a very slow rate of growth over
the past 11 years from 1.7 cm, and is likely benign, though would
correlate with adrenal labs. The right adrenal gland is unremarkable
in appearance.

Minimal left-sided hydronephrosis is seen, with left-sided
perinephric stranding. A small obstructing 3 mm stone is noted
distally at the left vesicoureteral junction, along the base of the
bladder. No nonobstructing renal stones are identified. The right
kidney is unremarkable in appearance.

Stomach/Bowel: The stomach is unremarkable in appearance. The small
bowel is within normal limits. The appendix is not visualized; there
is no evidence for appendicitis. The colon is unremarkable in
appearance.

Vascular/Lymphatic: Scattered calcification is seen along the
abdominal aorta and its branches. Mild calcification is seen along
the superior mesenteric artery. The inferior vena cava is grossly
unremarkable. No retroperitoneal lymphadenopathy is seen. No pelvic
sidewall lymphadenopathy is identified.

Reproductive: The bladder is largely decompressed and otherwise
grossly unremarkable. The uterus is grossly unremarkable in
appearance. No suspicious adnexal masses are seen.

Other: No additional soft tissue abnormalities are seen.
Postoperative change is noted at the right lower quadrant.

Musculoskeletal: No acute osseous abnormalities are identified. Mild
vacuum phenomenon and disc space narrowing is noted at L4-L5. The
visualized musculature is unremarkable in appearance.
IMPRESSION: 1. Minimal left-sided hydronephrosis, with small obstructing 3 mm
stone noted distally at the left vesicoureteral junction, along the
base of the bladder.
2. **An incidental finding of potential clinical significance has
been found. Adrenal mass has increased in size from 1.7 cm 11 years
ago, to 2.9 cm. This is a very slow rate of growth, and likely
reflects a benign lesion, though would correlate with adrenal
abscess. **
3. Mild bibasilar atelectasis or scarring noted.
4. Mild coronary artery calcifications seen.
5. Scattered aortic atherosclerosis noted. Mild calcification along
the superior mesenteric artery.

## 2018-04-20 DIAGNOSIS — I10 Essential (primary) hypertension: Secondary | ICD-10-CM | POA: Diagnosis not present

## 2018-04-20 DIAGNOSIS — Z0000 Encounter for general adult medical examination without abnormal findings: Secondary | ICD-10-CM | POA: Diagnosis not present

## 2018-04-20 DIAGNOSIS — E611 Iron deficiency: Secondary | ICD-10-CM | POA: Diagnosis not present

## 2018-04-20 DIAGNOSIS — E1122 Type 2 diabetes mellitus with diabetic chronic kidney disease: Secondary | ICD-10-CM | POA: Diagnosis not present

## 2018-04-20 DIAGNOSIS — E782 Mixed hyperlipidemia: Secondary | ICD-10-CM | POA: Diagnosis not present

## 2018-04-20 DIAGNOSIS — Z23 Encounter for immunization: Secondary | ICD-10-CM | POA: Diagnosis not present

## 2018-04-20 DIAGNOSIS — J449 Chronic obstructive pulmonary disease, unspecified: Secondary | ICD-10-CM | POA: Diagnosis not present

## 2018-04-20 DIAGNOSIS — N183 Chronic kidney disease, stage 3 (moderate): Secondary | ICD-10-CM | POA: Diagnosis not present

## 2018-04-20 DIAGNOSIS — G894 Chronic pain syndrome: Secondary | ICD-10-CM | POA: Diagnosis not present

## 2018-04-20 DIAGNOSIS — M8588 Other specified disorders of bone density and structure, other site: Secondary | ICD-10-CM | POA: Diagnosis not present

## 2018-04-20 DIAGNOSIS — Z1389 Encounter for screening for other disorder: Secondary | ICD-10-CM | POA: Diagnosis not present

## 2018-05-06 ENCOUNTER — Other Ambulatory Visit: Payer: Self-pay | Admitting: Internal Medicine

## 2018-05-06 DIAGNOSIS — Z1231 Encounter for screening mammogram for malignant neoplasm of breast: Secondary | ICD-10-CM

## 2018-05-20 DIAGNOSIS — B0052 Herpesviral keratitis: Secondary | ICD-10-CM | POA: Diagnosis not present

## 2018-05-20 DIAGNOSIS — H17822 Peripheral opacity of cornea, left eye: Secondary | ICD-10-CM | POA: Diagnosis not present

## 2018-05-20 DIAGNOSIS — Z961 Presence of intraocular lens: Secondary | ICD-10-CM | POA: Diagnosis not present

## 2018-05-20 DIAGNOSIS — H1712 Central corneal opacity, left eye: Secondary | ICD-10-CM | POA: Diagnosis not present

## 2018-05-25 DIAGNOSIS — M8588 Other specified disorders of bone density and structure, other site: Secondary | ICD-10-CM | POA: Diagnosis not present

## 2018-06-09 ENCOUNTER — Ambulatory Visit
Admission: RE | Admit: 2018-06-09 | Discharge: 2018-06-09 | Disposition: A | Discharge status: Home / Self Care | Payer: Medicare Other | Source: Ambulatory Visit | Attending: Internal Medicine | Admitting: Internal Medicine

## 2018-06-09 DIAGNOSIS — Z1231 Encounter for screening mammogram for malignant neoplasm of breast: Secondary | ICD-10-CM | POA: Diagnosis not present

## 2018-06-10 ENCOUNTER — Other Ambulatory Visit: Payer: Self-pay | Admitting: Internal Medicine

## 2018-06-10 DIAGNOSIS — R928 Other abnormal and inconclusive findings on diagnostic imaging of breast: Secondary | ICD-10-CM

## 2018-06-12 ENCOUNTER — Ambulatory Visit
Admission: RE | Admit: 2018-06-12 | Discharge: 2018-06-12 | Disposition: A | Discharge status: Home / Self Care | Payer: Medicare Other | Source: Ambulatory Visit | Attending: Internal Medicine | Admitting: Internal Medicine

## 2018-06-12 ENCOUNTER — Other Ambulatory Visit: Payer: Self-pay | Admitting: Internal Medicine

## 2018-06-12 DIAGNOSIS — R928 Other abnormal and inconclusive findings on diagnostic imaging of breast: Secondary | ICD-10-CM

## 2018-06-12 DIAGNOSIS — N6310 Unspecified lump in the right breast, unspecified quadrant: Secondary | ICD-10-CM

## 2018-06-17 ENCOUNTER — Other Ambulatory Visit: Payer: Self-pay | Admitting: Internal Medicine

## 2018-06-17 DIAGNOSIS — N6310 Unspecified lump in the right breast, unspecified quadrant: Secondary | ICD-10-CM

## 2018-07-01 DIAGNOSIS — H35072 Retinal telangiectasis, left eye: Secondary | ICD-10-CM | POA: Diagnosis not present

## 2018-07-01 DIAGNOSIS — H35352 Cystoid macular degeneration, left eye: Secondary | ICD-10-CM | POA: Diagnosis not present

## 2018-07-01 DIAGNOSIS — H43812 Vitreous degeneration, left eye: Secondary | ICD-10-CM | POA: Diagnosis not present

## 2018-07-01 DIAGNOSIS — H43392 Other vitreous opacities, left eye: Secondary | ICD-10-CM | POA: Diagnosis not present

## 2018-08-19 DIAGNOSIS — B0052 Herpesviral keratitis: Secondary | ICD-10-CM | POA: Diagnosis not present

## 2018-08-19 DIAGNOSIS — H17822 Peripheral opacity of cornea, left eye: Secondary | ICD-10-CM | POA: Diagnosis not present

## 2018-08-19 DIAGNOSIS — H1712 Central corneal opacity, left eye: Secondary | ICD-10-CM | POA: Diagnosis not present

## 2018-08-19 DIAGNOSIS — H01022 Squamous blepharitis right lower eyelid: Secondary | ICD-10-CM | POA: Diagnosis not present

## 2018-08-19 DIAGNOSIS — H01021 Squamous blepharitis right upper eyelid: Secondary | ICD-10-CM | POA: Diagnosis not present

## 2018-08-19 DIAGNOSIS — H01025 Squamous blepharitis left lower eyelid: Secondary | ICD-10-CM | POA: Diagnosis not present

## 2018-08-19 DIAGNOSIS — H01024 Squamous blepharitis left upper eyelid: Secondary | ICD-10-CM | POA: Diagnosis not present

## 2018-08-19 DIAGNOSIS — H04123 Dry eye syndrome of bilateral lacrimal glands: Secondary | ICD-10-CM | POA: Diagnosis not present

## 2018-09-01 ENCOUNTER — Inpatient Hospital Stay (HOSPITAL_BASED_OUTPATIENT_CLINIC_OR_DEPARTMENT_OTHER): Payer: Medicare Other | Admitting: Oncology

## 2018-09-01 ENCOUNTER — Telehealth: Payer: Self-pay | Admitting: Oncology

## 2018-09-01 ENCOUNTER — Encounter: Payer: Self-pay | Admitting: Oncology

## 2018-09-01 ENCOUNTER — Inpatient Hospital Stay: Payer: Medicare Other | Attending: Oncology

## 2018-09-01 VITALS — BP 121/70 | HR 92 | Temp 98.2°F | Resp 18 | Ht 63.0 in | Wt 199.6 lb

## 2018-09-01 DIAGNOSIS — D500 Iron deficiency anemia secondary to blood loss (chronic): Secondary | ICD-10-CM

## 2018-09-01 DIAGNOSIS — D508 Other iron deficiency anemias: Secondary | ICD-10-CM | POA: Insufficient documentation

## 2018-09-01 DIAGNOSIS — K909 Intestinal malabsorption, unspecified: Secondary | ICD-10-CM

## 2018-09-01 DIAGNOSIS — Z79899 Other long term (current) drug therapy: Secondary | ICD-10-CM | POA: Insufficient documentation

## 2018-09-01 DIAGNOSIS — D509 Iron deficiency anemia, unspecified: Secondary | ICD-10-CM

## 2018-09-01 DIAGNOSIS — D72829 Elevated white blood cell count, unspecified: Secondary | ICD-10-CM | POA: Insufficient documentation

## 2018-09-01 LAB — CBC WITH DIFFERENTIAL (CANCER CENTER ONLY)
Abs Immature Granulocytes: 0.08 10*3/uL — ABNORMAL HIGH (ref 0.00–0.07)
Basophils Absolute: 0.1 10*3/uL (ref 0.0–0.1)
Basophils Relative: 0 %
Eosinophils Absolute: 0.4 10*3/uL (ref 0.0–0.5)
Eosinophils Relative: 3 %
HCT: 36.8 % (ref 36.0–46.0)
Hemoglobin: 11.3 g/dL — ABNORMAL LOW (ref 12.0–15.0)
Immature Granulocytes: 1 %
Lymphocytes Relative: 15 %
Lymphs Abs: 2.1 10*3/uL (ref 0.7–4.0)
MCH: 29.3 pg (ref 26.0–34.0)
MCHC: 30.7 g/dL (ref 30.0–36.0)
MCV: 95.3 fL (ref 80.0–100.0)
Monocytes Absolute: 0.9 10*3/uL (ref 0.1–1.0)
Monocytes Relative: 7 %
Neutro Abs: 10.5 10*3/uL — ABNORMAL HIGH (ref 1.7–7.7)
Neutrophils Relative %: 74 %
Platelet Count: 255 10*3/uL (ref 150–400)
RBC: 3.86 MIL/uL — ABNORMAL LOW (ref 3.87–5.11)
RDW: 14.2 % (ref 11.5–15.5)
WBC Count: 14.1 10*3/uL — ABNORMAL HIGH (ref 4.0–10.5)
nRBC: 0 % (ref 0.0–0.2)

## 2018-09-01 LAB — IRON AND TIBC
Iron: 35 ug/dL — ABNORMAL LOW (ref 41–142)
Saturation Ratios: 14 % — ABNORMAL LOW (ref 21–57)
TIBC: 255 ug/dL (ref 236–444)
UIBC: 220 ug/dL (ref 120–384)

## 2018-09-01 LAB — FERRITIN: Ferritin: 102 ng/mL (ref 11–307)

## 2018-09-01 NOTE — Progress Notes (Signed)
Hematology and Oncology Follow Up Visit  Christina Bennett [id number] [birth year] 78 y.o. 09/01/2018 9:57 AM    Principle Diagnosis: 78 year old with iron deficiency anemia related to poor absorption diagnosed and 2013.   Prior therapy: IV iron in the form of Feraheme of total 1000 mg given every 6 to 12 months.  Current therapy:  Repeat IV iron as needed.  Interim History: [name] is here for repeat evaluation.  Since last visit, she reports no major changes in her health.  She tolerated intravenous iron infusion in July 2019 without any complications.  She reported improvement in her performance status and activity level.  She denies any hematochezia, melena or epistaxis.  She continues to attend activities of daily living.   He does not report any headaches or blurred vision or double vision. She does not report any dizziness or lethargy.  She denies any fevers or chills or sweats. She did not report any chest pain or dyspnea on exertion.  She does not report any cough, wheezing.  She does not report any hemoptysis. She does not report any nausea, vomiting or early satiety.  She does not report any changes in bowel habits.  Is not reporting any frequency urgency or hesitancy.  She does not report any arthralgias or myalgias.  She does not report any ecchymosis or petechiae. Her remaining review of systems is negative.   Medications: I have reviewed the patient's current medications.  Current Outpatient Medications  Medication Sig Dispense Refill  . albuterol (PROVENTIL HFA;VENTOLIN HFA) 108 (90 BASE) MCG/ACT inhaler Inhale 2 puffs into the lungs every 4 (four) hours as needed for wheezing or shortness of breath.     . fluticasone (FLONASE) 50 MCG/ACT nasal spray Place 2 sprays into both nostrils every evening.     Marland Kitchen HYDROcodone-acetaminophen (NORCO/VICODIN) 5-325 MG tablet Take 1 tablet by mouth 2 (two) times daily as needed for moderate pain.    Marland Kitchen losartan-hydrochlorothiazide (HYZAAR)  100-25 MG tablet Take 1 tablet by mouth daily.    . montelukast (SINGULAIR) 10 MG tablet Take 10 mg by mouth every evening.   2  . omeprazole (PRILOSEC) 20 MG capsule Take 20 mg by mouth daily.     . simvastatin (ZOCOR) 40 MG tablet Take 40 mg by mouth every evening.    . SYMBICORT 160-4.5 MCG/ACT inhaler Inhale 2 puffs into the lungs every evening.   4  . valACYclovir (VALTREX) 500 MG tablet Take 500 mg by mouth 2 (two) times daily.   0  . Vitamin D, Ergocalciferol, (DRISDOL) 50000 UNITS CAPS Take 50,000 Units by mouth every 14 (fourteen) days.     No current facility-administered medications for this visit.     Allergies:  Allergies  Allergen Reactions  . Aspirin Swelling and Rash    Throat swells.  . Bee Venom Anaphylaxis  . Ibuprofen Shortness Of Breath and Swelling    Throat swells.  Marland Kitchen Percocet [Oxycodone-Acetaminophen] Itching    Past Medical History, Surgical history, Social history, and Family History reviewed and unchanged at this time.    Physical Exam: Blood pressure 121/70, pulse 92, temperature 98.2 F (36.8 C), temperature source Oral, resp. rate 18, height 5\' 3"  (1.6 m), weight 199 lb 9.6 oz (90.5 kg), SpO2 97 %.   ECOG: 0   General appearance: Comfortable appearing without any discomfort Head: Normocephalic without any trauma Oropharynx: Mucous membranes are moist and pink without any thrush or ulcers. Eyes: Pupils are equal and round reactive to light. Lymph  nodes: No cervical, supraclavicular, inguinal or axillary lymphadenopathy.   Heart:regular rate and rhythm.  S1 and S2 without leg edema. Lung: Clear without any rhonchi or wheezes.  No dullness to percussion. Abdomin: Soft, nontender, nondistended with good bowel sounds.  No hepatosplenomegaly. Musculoskeletal: No joint deformity or effusion.  Full range of motion noted. Neurological: No deficits noted on motor, sensory and deep tendon reflex exam. Skin: No petechial rash or dryness.  Appeared moist.      Lab Results: Lab Results  Component Value Date   WBC 14.1 (H) 09/01/2018   HGB 11.3 (L) 09/01/2018   HCT 36.8 09/01/2018   MCV 95.3 09/01/2018   PLT 255 09/01/2018     Chemistry      Component Value Date/Time   NA 136 05/07/2016 0326   NA 141 05/02/2015 0952   K 3.8 05/07/2016 0326   K 4.2 05/02/2015 0952   CL 101 05/07/2016 0326   CL 104 04/23/2012 1017   CO2 23 05/07/2016 0326   CO2 28 05/02/2015 0952   BUN 22 (H) 05/07/2016 0326   BUN 13.0 05/02/2015 0952   CREATININE 1.06 (H) 05/07/2016 0326   CREATININE 0.7 05/02/2015 0952      Component Value Date/Time   CALCIUM 9.2 05/07/2016 0326   CALCIUM 9.4 05/02/2015 0952   ALKPHOS 81 05/07/2016 0326   ALKPHOS 82 05/02/2015 0952   AST 18 05/07/2016 0326   AST 12 05/02/2015 0952   ALT 9 (L) 05/07/2016 0326   ALT 7 05/02/2015 0952   BILITOT 0.8 05/07/2016 0326   BILITOT 0.38 05/02/2015 0952        Results for Christina, Bennett (MRN [id number]) as of 09/01/2018 09:57  Ref. Range 09/05/2017 09:51 03/03/2018 09:47  Iron Latest Ref Range: 41 - 142 ug/dL 42 22 (L)  UIBC Latest Units: ug/dL 244 302  TIBC Latest Ref Range: 236 - 444 ug/dL 286 324  Saturation Ratios Latest Ref Range: 21 - 57 % 15 (L) 7 (L)  Ferritin Latest Ref Range: 11 - 307 ng/mL 73 36       Impression and Plan:  78 year old woman with:   1.  Anemia: Related to iron deficiency and poor iron absorption.  She continues to receive intermittent intravenous iron infusion without complications.  Iron studies in July 2019 showed slight decline and received intravenous iron in the interim.  Her hemoglobin is improved at this time and she is asymptomatic.  Plan is to continue with intermittent intravenous iron infusion likely on an annual basis.  This will be done sooner if her iron studies today are declining.  2. Colon cancer screening: She continues to receive colonoscopy [place].  She is up-to-date.  3. Leukocytosis.  Reactive in nature and  fluctuating.  No changes from her baseline.   4. Followup: Will be in 6 months for possible iron infusion and repeat laboratory testing.  15  minutes was spent with the patient face-to-face today.  More than 50% of time was dedicated to knowing disease status, laboratory data and treatment options.   Firas Shadad 1/21/20209:57 AM

## 2018-09-01 NOTE — Telephone Encounter (Signed)
Scheduled appt per 01/21 los. ° °Printed calendar and avs. °

## 2018-10-21 DIAGNOSIS — M1990 Unspecified osteoarthritis, unspecified site: Secondary | ICD-10-CM | POA: Diagnosis not present

## 2018-10-21 DIAGNOSIS — N183 Chronic kidney disease, stage 3 (moderate): Secondary | ICD-10-CM | POA: Diagnosis not present

## 2018-10-21 DIAGNOSIS — I10 Essential (primary) hypertension: Secondary | ICD-10-CM | POA: Diagnosis not present

## 2018-10-21 DIAGNOSIS — G894 Chronic pain syndrome: Secondary | ICD-10-CM | POA: Diagnosis not present

## 2018-10-21 DIAGNOSIS — K219 Gastro-esophageal reflux disease without esophagitis: Secondary | ICD-10-CM | POA: Diagnosis not present

## 2018-10-21 DIAGNOSIS — G8929 Other chronic pain: Secondary | ICD-10-CM | POA: Diagnosis not present

## 2018-10-21 DIAGNOSIS — D509 Iron deficiency anemia, unspecified: Secondary | ICD-10-CM | POA: Diagnosis not present

## 2018-10-21 DIAGNOSIS — E782 Mixed hyperlipidemia: Secondary | ICD-10-CM | POA: Diagnosis not present

## 2018-10-21 DIAGNOSIS — E1122 Type 2 diabetes mellitus with diabetic chronic kidney disease: Secondary | ICD-10-CM | POA: Diagnosis not present

## 2018-10-21 DIAGNOSIS — Z7984 Long term (current) use of oral hypoglycemic drugs: Secondary | ICD-10-CM | POA: Diagnosis not present

## 2018-10-21 DIAGNOSIS — J449 Chronic obstructive pulmonary disease, unspecified: Secondary | ICD-10-CM | POA: Diagnosis not present

## 2018-11-04 DIAGNOSIS — M79606 Pain in leg, unspecified: Secondary | ICD-10-CM | POA: Diagnosis not present

## 2018-11-04 DIAGNOSIS — M25561 Pain in right knee: Secondary | ICD-10-CM | POA: Diagnosis not present

## 2018-12-11 DIAGNOSIS — M25561 Pain in right knee: Secondary | ICD-10-CM | POA: Diagnosis not present

## 2018-12-16 ENCOUNTER — Other Ambulatory Visit: Payer: Medicare Other

## 2018-12-22 DIAGNOSIS — M25551 Pain in right hip: Secondary | ICD-10-CM | POA: Diagnosis not present

## 2018-12-22 DIAGNOSIS — S83241A Other tear of medial meniscus, current injury, right knee, initial encounter: Secondary | ICD-10-CM | POA: Diagnosis not present

## 2018-12-25 DIAGNOSIS — M25561 Pain in right knee: Secondary | ICD-10-CM | POA: Diagnosis not present

## 2018-12-29 DIAGNOSIS — M1711 Unilateral primary osteoarthritis, right knee: Secondary | ICD-10-CM | POA: Diagnosis not present

## 2019-01-11 ENCOUNTER — Other Ambulatory Visit: Payer: Self-pay

## 2019-01-11 ENCOUNTER — Ambulatory Visit
Admission: RE | Admit: 2019-01-11 | Discharge: 2019-01-11 | Disposition: A | Discharge status: Home / Self Care | Payer: Medicare Other | Source: Ambulatory Visit | Attending: Internal Medicine | Admitting: Internal Medicine

## 2019-01-11 DIAGNOSIS — N6001 Solitary cyst of right breast: Secondary | ICD-10-CM | POA: Diagnosis not present

## 2019-01-11 DIAGNOSIS — R928 Other abnormal and inconclusive findings on diagnostic imaging of breast: Secondary | ICD-10-CM | POA: Diagnosis not present

## 2019-01-11 DIAGNOSIS — N6310 Unspecified lump in the right breast, unspecified quadrant: Secondary | ICD-10-CM

## 2019-02-09 DIAGNOSIS — M1711 Unilateral primary osteoarthritis, right knee: Secondary | ICD-10-CM | POA: Diagnosis not present

## 2019-02-10 ENCOUNTER — Telehealth: Payer: Self-pay | Admitting: *Deleted

## 2019-02-10 NOTE — Telephone Encounter (Signed)
Called patient to to get her scheduled for a follow up appt. Appt made, advised patient to wear a mask, asked covid questions and reviewed no visitor policy with patient. Patient voice understanding.                COVID-19 Pre-Screening Questions:  . In the past 7 to 10 days have you had a cough,  shortness of breath, headache, congestion, fever (100 or greater) body aches, chills, sore throat, or sudden loss of taste or sense of smell?NO . Have you been around anyone with known Covid 19.NO . Have you been around anyone who is awaiting Covid 19 test results in the past 7 to 10 days?NO . Have you been around anyone who has been exposed to Covid 19, or has mentioned symptoms of Covid 19 within the past 7 to 10 days?NO  If you have any concerns/questions about symptoms patients report during screening (either on the phone or at threshold). Contact the provider seeing the patient or DOD for further guidance.  If neither are available contact a member of the leadership team.

## 2019-02-10 NOTE — Telephone Encounter (Signed)
   Primary Cardiologist:Christina Nicholes Stairs III, MD  Chart reviewed as part of pre-operative protocol coverage. Because of Christina Bennett's past medical history and time since last visit (more than 15 months), he/she will require a follow-up visit in order to better assess preoperative cardiovascular risk.  Pre-op covering staff: - Please schedule appointment and call patient to inform them. - Please contact requesting surgeon's office via preferred method (i.e, phone, fax) to inform them of need for appointment prior to surgery.  If applicable, this message will also be routed to pharmacy pool and/or primary cardiologist for input on holding anticoagulant/antiplatelet agent as requested below so that this information is available at time of patient's appointment.   Kerin Ransom, PA-C  02/10/2019, 2:46 PM

## 2019-02-10 NOTE — Telephone Encounter (Signed)
   [facility] Pre-operative Risk Assessment    Request for surgical clearance:  1. 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 When is this surgery scheduled? TBD   3. What type of clearance is required (medical clearance vs. Pharmacy clearance to hold med vs. Both)? Medical clearance  4. Are there any medications that need to be held prior to surgery and how long?none listed   5. Practice name and name of physician performing surgery? [facility]  Elsie Saas, MD  6. What is your office phone number [phone]   7.   What is your office fax number [phone] Attn Sherri  8.   Anesthesia type (None, local, MAC, general) ? None listed   Juventino Slovak 02/10/2019, 2:29 PM  _________________________________________________________________   (provider comments below)

## 2019-02-17 DIAGNOSIS — H01021 Squamous blepharitis right upper eyelid: Secondary | ICD-10-CM | POA: Diagnosis not present

## 2019-02-17 DIAGNOSIS — H01024 Squamous blepharitis left upper eyelid: Secondary | ICD-10-CM | POA: Diagnosis not present

## 2019-02-17 DIAGNOSIS — H01022 Squamous blepharitis right lower eyelid: Secondary | ICD-10-CM | POA: Diagnosis not present

## 2019-02-17 DIAGNOSIS — H04123 Dry eye syndrome of bilateral lacrimal glands: Secondary | ICD-10-CM | POA: Diagnosis not present

## 2019-02-17 DIAGNOSIS — H01025 Squamous blepharitis left lower eyelid: Secondary | ICD-10-CM | POA: Diagnosis not present

## 2019-02-17 DIAGNOSIS — B0052 Herpesviral keratitis: Secondary | ICD-10-CM | POA: Diagnosis not present

## 2019-02-17 DIAGNOSIS — H1712 Central corneal opacity, left eye: Secondary | ICD-10-CM | POA: Diagnosis not present

## 2019-02-17 DIAGNOSIS — H17822 Peripheral opacity of cornea, left eye: Secondary | ICD-10-CM | POA: Diagnosis not present

## 2019-02-23 ENCOUNTER — Telehealth: Payer: Self-pay | Admitting: Physician Assistant

## 2019-02-23 NOTE — Telephone Encounter (Signed)
New Message ° ° ° °Left message to confirm appt and answer covid questions  °

## 2019-02-23 NOTE — Telephone Encounter (Signed)
Follow up    Patient is returning call in reference to screening.

## 2019-02-23 NOTE — Progress Notes (Signed)
Cardiology Office Note    Date:  02/24/2019   ID:  Christina Bennett, Christina Bennett 1940/11/04, MRN [id number]  PCP:  Wenda Low, MD  Cardiologist:  Dr. Tamala Julian   Chief Complaint: surgical clearance for Right total knee replacement   History of Present Illness:   Christina Bennett is a 78 y.o. 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   Not on ASA due to allergy (throat swelling and itching).  Stress test 10/2017 was low risk study without reversible ischemia. LVEF 61% with normal WM.  Last seen by Dr. Tamala Julian 10/15/2017>> follow up PRN.   Today patient denies any cardiac symptoms.  Currently new sedentary lifestyle secondary to severe right knee pain.  Previously, she was very active doing yard work and walking.  The patient denies nausea, vomiting, fever, chest pain, palpitations, shortness of breath, orthopnea, PND, dizziness, syncope, cough, congestion, abdominal pain, hematochezia, melena, lower extremity edema.   Past Medical History:  Diagnosis Date  . Anemia    iron insufion on 05/2016   . Atypical chest pain    Atypical CP--stress test, cor angio, and CT chest negative in 2005. Nuclear study normal in 2011   . COPD (chronic obstructive pulmonary disease) ([place])   . Diabetes ([place])    Diabetes type 2- 1/13- metformin started  . Dyslipidemia   . Endometriosis    with ovarian radiation in 1966, gyn Dr cousins  . GERD (gastroesophageal reflux disease)    H/H  . Herpes    herpes of the left eye, Dr Patrice Paradise optho  . History of DVT (deep vein thrombosis)    on OCP  . History of kidney stones   . Hypertension   . Mild anemia    WBC mild high, lab 2012/ HEMATOLOGY   . Osteoarthritis    Osteoarthritis of the hip, knee, and hand, vicodin , prn  . Osteopenia    BD in 2013  . Tobacco use    quit 03/2008  . Vitamin D deficiency     Past Surgical History:  Procedure Laterality Date  . APPENDECTOMY    .  BALLOON DILATION N/A 07/09/2016   Procedure: BALLOON DILATION;  Surgeon: Garlan Fair, MD;  Location: [facility];  Service: Endoscopy;  Laterality: N/A;  . COLONOSCOPY WITH PROPOFOL N/A 07/09/2016   Procedure: COLONOSCOPY WITH PROPOFOL;  Surgeon: Garlan Fair, MD;  Location: [facility];  Service: Endoscopy;  Laterality: N/A;  . ESOPHAGOGASTRODUODENOSCOPY N/A 07/09/2016   Procedure: ESOPHAGOGASTRODUODENOSCOPY (EGD);  Surgeon: Garlan Fair, MD;  Location: [facility];  Service: Endoscopy;  Laterality: N/A;  . EYE SURGERY     left eye cataract srugery   . HERNIA REPAIR    . surgery fro endometriosis    . TONSILLECTOMY      Current Medications: Prior to Admission medications   Medication Sig Start Date End Date Taking? Authorizing Provider  albuterol (PROVENTIL HFA;VENTOLIN HFA) 108 (90 BASE) MCG/ACT inhaler Inhale 2 puffs into the lungs every 4 (four) hours as needed for wheezing or shortness of breath.     [provider]  fluticasone (FLONASE) 50 MCG/ACT nasal spray Place 2 sprays into both nostrils every evening.  08/24/14   [provider]  HYDROcodone-acetaminophen (NORCO/VICODIN) 5-325 MG tablet Take 1 tablet by mouth 2 (two) times daily as needed for moderate pain.    [provider]  losartan-hydrochlorothiazide (HYZAAR) 100-25 MG tablet Take 1 tablet by mouth  daily.    [provider]  montelukast (SINGULAIR) 10 MG tablet Take 10 mg by mouth every evening.  01/31/16   [provider]  omeprazole (PRILOSEC) 20 MG capsule Take 20 mg by mouth daily.     [provider]  simvastatin (ZOCOR) 40 MG tablet Take 40 mg by mouth every evening.    [provider]  SYMBICORT 160-4.5 MCG/ACT inhaler Inhale 2 puffs into the lungs every evening.  06/13/14   [provider]  valACYclovir (VALTREX) 500 MG tablet Take 500 mg by mouth 2 (two) times daily.  01/31/16   [provider]  Vitamin D, Ergocalciferol,  (DRISDOL) 50000 UNITS CAPS Take 50,000 Units by mouth every 14 (fourteen) days.    [provider]    Allergies:   Aspirin, Bee venom, Ibuprofen, and Percocet [oxycodone-acetaminophen]   Social History   Socioeconomic History  . Marital status: Single    Spouse name: Not on file  . Number of children: Not on file  . Years of education: Not on file  . Highest education level: Not on file  Occupational History  . Not on file  Social Needs  . Financial resource strain: Not on file  . Food insecurity    Worry: Not on file    Inability: Not on file  . Transportation needs    Medical: Not on file    Non-medical: Not on file  Tobacco Use  . Smoking status: Former Research scientist (life sciences)  . Smokeless tobacco: Never Used  Substance and Sexual Activity  . Alcohol use: No  . Drug use: No  . Sexual activity: Not on file  Lifestyle  . Physical activity    Days per week: Not on file    Minutes per session: Not on file  . Stress: Not on file  Relationships  . Social Herbalist on phone: Not on file    Gets together: Not on file    Attends religious service: Not on file    Active member of club or organization: Not on file    Attends meetings of clubs or organizations: Not on file    Relationship status: Not on file  Other Topics Concern  . Not on file  Social History Narrative  . Not on file     Family History:  The patient's family history includes Heart disease in her brother and brother; Hypertension in her mother.   ROS:   Please see the history of present illness.    ROS All other systems reviewed and are negative.   PHYSICAL EXAM:   VS:  BP 122/72   Pulse 93   Ht 5\' 3"  (1.6 m)   Wt 188 lb 1.9 oz (85.3 kg)   SpO2 96%   BMI 33.32 kg/m    GEN: Well nourished, well developed, in no acute distress  HEENT: normal  Neck: no JVD, carotid bruits, or masses Cardiac: RRR; no murmurs, rubs, or gallops,no edema  Respiratory:  clear to auscultation bilaterally, normal  work of breathing GI: soft, nontender, nondistended, + BS MS: no deformity or atrophy  Skin: warm and dry, no rash Neuro:  Alert and Oriented x 3, Strength and sensation are intact Psych: euthymic mood, full affect  Wt Readings from Last 3 Encounters:  02/24/19 188 lb 1.9 oz (85.3 kg)  09/01/18 199 lb 9.6 oz (90.5 kg)  03/11/18 199 lb 14.4 oz (90.7 kg)      Studies/Labs Reviewed:   EKG:  EKG is  ordered today.  The ekg ordered today demonstrates SR at rate of 93 bpm  Recent Labs: 09/01/2018: Hemoglobin 11.3; Platelet Count 255   Lipid Panel No results found for: CHOL, TRIG, HDL, CHOLHDL, VLDL, LDLCALC, LDLDIRECT  Additional studies/ records that were reviewed today include:   Stress test 10/2017  Nuclear stress EF: 61%.  No T wave inversion was noted during stress.  There was no ST segment deviation noted during stress.  This is a low risk study.   No reversible ischemia. LVEF 61% with normal wall motion. This is a low risk study.   ASSESSMENT & PLAN:    1. Non obstructive CAD (50% LAD by cath in 2005) - Low risk myoview 10/2017. No angina. Not on ASA due to allergy.  Continue statin.   2. HTN - BP stable on Losartan.   3. HLD - Managed by PCP. Continue Zocor.   4. DM - Per PCP  5. Pre-op clearance  -Patient is not active secondary to right knee pain.  Last year she was active doing yard work and walking.  She had reassuring stress test last year.  EKG without acute changes today.  No cardiac symptoms.  Given past medical history and time since last visit, based on ACC/AHA guidelines, Christina Bennett would be at acceptable risk for the planned procedure without further cardiovascular testing.   I will route this recommendation to the requesting party via Epic fax function and remove from pre-op pool.  Please call with questions.  [place], [place] 02/24/2019, 11:15 AM      Medication Adjustments/Labs and Tests Ordered: Current medicines are  reviewed at length with the patient today.  Concerns regarding medicines are outlined above.  Medication changes, Labs and Tests ordered today are listed in the Patient Instructions below. Patient Instructions  Medication Instructions:  Your physician recommends that you continue on your current medications as directed. Please refer to the Current Medication list given to you today.  If you need a refill on your cardiac medications before your next appointment, please call your pharmacy.   Lab work: None ordered  If you have labs (blood work) drawn today and your tests are completely normal, you will receive your results only by: Marland Kitchen MyChart Message (if you have MyChart) OR . A paper copy in the mail If you have any lab test that is abnormal or we need to change your treatment, we will call you to review the results.  Testing/Procedures: None ordered  Follow-Up: At [facility], you and your health needs are our priority.  As part of our continuing mission to provide you with exceptional heart care, we have created designated Provider Care Teams.  These Care Teams include your primary Cardiologist (physician) and Advanced Practice Providers (APPs -  Physician Assistants and Nurse Practitioners) who all work together to provide you with the care you need, when you need it. You will need a follow up appointment AS NEEDED. Any Other Special Instructions Will Be Listed Below (If Applicable).       Jarrett Soho, [place]  02/24/2019 11:13 AM    [address], [place], [place]  [address] Phone: [phone]; Fax: [phone]

## 2019-02-23 NOTE — Telephone Encounter (Signed)

## 2019-02-24 ENCOUNTER — Encounter: Payer: Self-pay | Admitting: Physician Assistant

## 2019-02-24 ENCOUNTER — Ambulatory Visit (INDEPENDENT_AMBULATORY_CARE_PROVIDER_SITE_OTHER): Payer: Medicare Other | Admitting: Physician Assistant

## 2019-02-24 ENCOUNTER — Other Ambulatory Visit: Payer: Self-pay

## 2019-02-24 ENCOUNTER — Encounter (INDEPENDENT_AMBULATORY_CARE_PROVIDER_SITE_OTHER): Payer: Self-pay

## 2019-02-24 VITALS — BP 122/72 | HR 93 | Ht 63.0 in | Wt 188.1 lb

## 2019-02-24 DIAGNOSIS — I25118 Atherosclerotic heart disease of native coronary artery with other forms of angina pectoris: Secondary | ICD-10-CM | POA: Diagnosis not present

## 2019-02-24 DIAGNOSIS — E782 Mixed hyperlipidemia: Secondary | ICD-10-CM | POA: Diagnosis not present

## 2019-02-24 DIAGNOSIS — E118 Type 2 diabetes mellitus with unspecified complications: Secondary | ICD-10-CM

## 2019-02-24 DIAGNOSIS — Z01818 Encounter for other preprocedural examination: Secondary | ICD-10-CM | POA: Diagnosis not present

## 2019-02-24 NOTE — Patient Instructions (Addendum)
Medication Instructions:  Your physician recommends that you continue on your current medications as directed. Please refer to the Current Medication list given to you today.  If you need a refill on your cardiac medications before your next appointment, please call your pharmacy.   Lab work: None ordered  If you have labs (blood work) drawn today and your tests are completely normal, you will receive your results only by: Marland Kitchen MyChart Message (if you have MyChart) OR . A paper copy in the mail If you have any lab test that is abnormal or we need to change your treatment, we will call you to review the results.  Testing/Procedures: None ordered  Follow-Up: At [facility], you and your health needs are our priority.  As part of our continuing mission to provide you with exceptional heart care, we have created designated Provider Care Teams.  These Care Teams include your primary Cardiologist (physician) and Advanced Practice Providers (APPs -  Physician Assistants and Nurse Practitioners) who all work together to provide you with the care you need, when you need it. You will need a follow up appointment AS NEEDED. Any Other Special Instructions Will Be Listed Below (If Applicable).

## 2019-03-02 ENCOUNTER — Inpatient Hospital Stay: Payer: Medicare Other

## 2019-03-02 ENCOUNTER — Inpatient Hospital Stay (HOSPITAL_BASED_OUTPATIENT_CLINIC_OR_DEPARTMENT_OTHER): Payer: Medicare Other | Admitting: Oncology

## 2019-03-02 ENCOUNTER — Other Ambulatory Visit: Payer: Self-pay

## 2019-03-02 ENCOUNTER — Inpatient Hospital Stay: Payer: Medicare Other | Attending: Oncology

## 2019-03-02 ENCOUNTER — Telehealth: Payer: Self-pay | Admitting: Oncology

## 2019-03-02 VITALS — BP 110/58 | HR 80 | Temp 98.7°F | Resp 18

## 2019-03-02 VITALS — BP 111/60 | HR 88 | Temp 99.2°F | Resp 17 | Ht 63.0 in | Wt 186.9 lb

## 2019-03-02 DIAGNOSIS — Z79899 Other long term (current) drug therapy: Secondary | ICD-10-CM

## 2019-03-02 DIAGNOSIS — D72829 Elevated white blood cell count, unspecified: Secondary | ICD-10-CM | POA: Diagnosis not present

## 2019-03-02 DIAGNOSIS — D500 Iron deficiency anemia secondary to blood loss (chronic): Secondary | ICD-10-CM | POA: Diagnosis not present

## 2019-03-02 DIAGNOSIS — D509 Iron deficiency anemia, unspecified: Secondary | ICD-10-CM | POA: Diagnosis not present

## 2019-03-02 DIAGNOSIS — Z886 Allergy status to analgesic agent status: Secondary | ICD-10-CM

## 2019-03-02 DIAGNOSIS — Z885 Allergy status to narcotic agent status: Secondary | ICD-10-CM | POA: Diagnosis not present

## 2019-03-02 LAB — CBC WITH DIFFERENTIAL (CANCER CENTER ONLY)
Abs Immature Granulocytes: 0.08 10*3/uL — ABNORMAL HIGH (ref 0.00–0.07)
Basophils Absolute: 0.1 10*3/uL (ref 0.0–0.1)
Basophils Relative: 0 %
Eosinophils Absolute: 0.6 10*3/uL — ABNORMAL HIGH (ref 0.0–0.5)
Eosinophils Relative: 4 %
HCT: 35.3 % — ABNORMAL LOW (ref 36.0–46.0)
Hemoglobin: 10.6 g/dL — ABNORMAL LOW (ref 12.0–15.0)
Immature Granulocytes: 1 %
Lymphocytes Relative: 12 %
Lymphs Abs: 1.9 10*3/uL (ref 0.7–4.0)
MCH: 26.6 pg (ref 26.0–34.0)
MCHC: 30 g/dL (ref 30.0–36.0)
MCV: 88.7 fL (ref 80.0–100.0)
Monocytes Absolute: 0.9 10*3/uL (ref 0.1–1.0)
Monocytes Relative: 6 %
Neutro Abs: 11.6 10*3/uL — ABNORMAL HIGH (ref 1.7–7.7)
Neutrophils Relative %: 77 %
Platelet Count: 335 10*3/uL (ref 150–400)
RBC: 3.98 MIL/uL (ref 3.87–5.11)
RDW: 15.6 % — ABNORMAL HIGH (ref 11.5–15.5)
WBC Count: 15 10*3/uL — ABNORMAL HIGH (ref 4.0–10.5)
nRBC: 0 % (ref 0.0–0.2)

## 2019-03-02 LAB — IRON AND TIBC
Iron: 29 ug/dL — ABNORMAL LOW (ref 41–142)
Saturation Ratios: 9 % — ABNORMAL LOW (ref 21–57)
TIBC: 309 ug/dL (ref 236–444)
UIBC: 280 ug/dL (ref 120–384)

## 2019-03-02 LAB — FERRITIN: Ferritin: 59 ng/mL (ref 11–307)

## 2019-03-02 MED ORDER — SODIUM CHLORIDE 0.9 % IV SOLN
Freq: Once | INTRAVENOUS | Status: AC
  Administered 2019-03-02: 12:00:00 via INTRAVENOUS
  Filled 2019-03-02: qty 250

## 2019-03-02 MED ORDER — SODIUM CHLORIDE 0.9 % IV SOLN
510.0000 mg | Freq: Once | INTRAVENOUS | Status: AC
  Administered 2019-03-02: 510 mg via INTRAVENOUS
  Filled 2019-03-02: qty 17

## 2019-03-02 NOTE — Telephone Encounter (Signed)
Gave avs and calendar ° °

## 2019-03-02 NOTE — Progress Notes (Signed)
Hematology and Oncology Follow Up Visit  Christina Bennett [id number] Dec 20, 1940 78 y.o. 03/02/2019 11:53 AM    Principle Diagnosis: 78 year old woman with anemia diagnosed in 2013.  She was found to have iron deficiency related to inability to absorb oral iron.  Prior therapy: IV iron in the form of Feraheme of total 1000 mg given as needed.  Current therapy: Feraheme 1000 mg every 12 months.  Last treatment given in August 2020.  Interim History: [name] is here for a follow-up.  Since her last visit, he reports no major changes in her health.  He denies excessive fatigue, tiredness or hematochezia.  She denies any melena or recent hospitalizations.  She is scheduled to have a knee replacements tentatively in September.  She is ambulating without any difficulties or falls.  She has tolerated IV iron replacement as needed without any issues.  She denied any alteration mental status, neuropathy, confusion or dizziness.  Denies any headaches or lethargy.  Denies any night sweats, weight loss or changes in appetite.  Denied orthopnea, dyspnea on exertion or chest discomfort.  Denies shortness of breath, difficulty breathing hemoptysis or cough.  Denies any abdominal distention, nausea, early satiety or dyspepsia.  Denies any hematuria, frequency, dysuria or nocturia.  Denies any skin irritation, dryness or rash.  Denies any ecchymosis or petechiae.  Denies any lymphadenopathy or clotting.  Denies any heat or cold intolerance.  Denies any anxiety or depression.  Remaining review of system is negative.      Medications: No changes with review. Current Outpatient Medications  Medication Sig Dispense Refill  . albuterol (PROVENTIL HFA;VENTOLIN HFA) 108 (90 BASE) MCG/ACT inhaler Inhale 2 puffs into the lungs every 4 (four) hours as needed for wheezing or shortness of breath.     . fluticasone (FLONASE) 50 MCG/ACT nasal spray Place 2 sprays into both nostrils every evening.     .  hydrochlorothiazide (HYDRODIURIL) 25 MG tablet Take 25 mg by mouth daily.    Marland Kitchen HYDROcodone-acetaminophen (NORCO/VICODIN) 5-325 MG tablet Take 1 tablet by mouth 2 (two) times daily as needed for moderate pain.    Marland Kitchen losartan (COZAAR) 100 MG tablet Take 100 mg by mouth daily.    Marland Kitchen losartan-hydrochlorothiazide (HYZAAR) 100-25 MG tablet Take 1 tablet by mouth daily.    . montelukast (SINGULAIR) 10 MG tablet Take 10 mg by mouth every evening.   2  . omeprazole (PRILOSEC) 20 MG capsule Take 20 mg by mouth daily.     . simvastatin (ZOCOR) 40 MG tablet Take 40 mg by mouth every evening.    . SYMBICORT 160-4.5 MCG/ACT inhaler Inhale 2 puffs into the lungs every evening.   4  . valACYclovir (VALTREX) 500 MG tablet Take 500 mg by mouth 2 (two) times daily.   0  . Vitamin D, Ergocalciferol, (DRISDOL) 50000 UNITS CAPS Take 50,000 Units by mouth every 14 (fourteen) days.     No current facility-administered medications for this visit.     Allergies:  Allergies  Allergen Reactions  . Aspirin Swelling and Rash    Throat swells.  . Bee Venom Anaphylaxis  . Ibuprofen Shortness Of Breath and Swelling    Throat swells.  Marland Kitchen Percocet [Oxycodone-Acetaminophen] Itching    Past Medical History, Surgical history, Social history, and Family History without any changes on review.    Physical Exam: Blood pressure 111/60, pulse 88, temperature 99.2 F (37.3 C), temperature source Oral, resp. rate 17, height 5\' 3"  (1.6 m), weight 186 lb 14.4 oz (  84.8 kg), SpO2 98 %.   ECOG: 0     General appearance: Alert, awake without any distress. Head: Atraumatic without abnormalities Oropharynx: Without any thrush or ulcers. Eyes: No scleral icterus. Lymph nodes: No lymphadenopathy noted in the cervical, supraclavicular, or axillary nodes Heart:regular rate and rhythm, without any murmurs or gallops.   Lung: Clear to auscultation without any rhonchi, wheezes or dullness to percussion. Abdomin: Soft, nontender  without any shifting dullness or ascites. Musculoskeletal: No clubbing or cyanosis. Neurological: No motor or sensory deficits. Skin: No rashes or lesions.     Lab Results: Lab Results  Component Value Date   WBC 15.0 (H) 03/02/2019   HGB 10.6 (L) 03/02/2019   HCT 35.3 (L) 03/02/2019   MCV 88.7 03/02/2019   PLT 335 03/02/2019     Chemistry      Component Value Date/Time   NA 136 05/07/2016 0326   NA 141 05/02/2015 0952   K 3.8 05/07/2016 0326   K 4.2 05/02/2015 0952   CL 101 05/07/2016 0326   CL 104 04/23/2012 1017   CO2 23 05/07/2016 0326   CO2 28 05/02/2015 0952   BUN 22 (H) 05/07/2016 0326   BUN 13.0 05/02/2015 0952   CREATININE 1.06 (H) 05/07/2016 0326   CREATININE 0.7 05/02/2015 0952      Component Value Date/Time   CALCIUM 9.2 05/07/2016 0326   CALCIUM 9.4 05/02/2015 0952   ALKPHOS 81 05/07/2016 0326   ALKPHOS 82 05/02/2015 0952   AST 18 05/07/2016 0326   AST 12 05/02/2015 0952   ALT 9 (L) 05/07/2016 0326   ALT 7 05/02/2015 0952   BILITOT 0.8 05/07/2016 0326   BILITOT 0.38 05/02/2015 0952         Results for MUSHKA, LACONTE (MRN [id number]) as of 03/02/2019 11:59  Ref. Range 03/03/2018 09:47 09/01/2018 09:44  Iron Latest Ref Range: 41 - 142 ug/dL 22 (L) 35 (L)  UIBC Latest Ref Range: 120 - 384 ug/dL 302 220  TIBC Latest Ref Range: 236 - 444 ug/dL 324 255  Saturation Ratios Latest Ref Range: 21 - 57 % 7 (L) 14 (L)  Ferritin Latest Ref Range: 11 - 307 ng/mL 36 102       Impression and Plan:  78 year old woman with:   1.  Iron deficiency anemia diagnosed in 2013 related to poor oral iron absorption.  She has tolerated IV iron infusion intermittently without any major complications.  Iron studies obtained in January 2020 showed still iron deficiency with iron level of 35 with saturation of 14%.  Her hemoglobin today does indicate slight decline which likely requires repeat IV iron infusion.  Risks and benefits of proceeding with IV iron was  discussed today and she is agreeable to continue.  She will receive Feraheme for a total of 1 g in 2 infusions.  Risk of anaphylaxis were also reiterated and she is agreeable to proceed.  2. Colon cancer screening: She is up-to-date with colonoscopy.  3. Leukocytosis.  Reactive in nature without any evidence to suggest hematological disorder.   4. Followup: In 6 months for repeat laboratory testing.  15  minutes was spent with the patient face-to-face today.  More than 50% of time was spent on reviewing her laboratory results, treatment options and complications related to therapy.   Zola Button 7/21/202011:53 AM

## 2019-03-02 NOTE — Patient Instructions (Signed)

## 2019-03-05 DIAGNOSIS — N183 Chronic kidney disease, stage 3 (moderate): Secondary | ICD-10-CM | POA: Diagnosis not present

## 2019-03-05 DIAGNOSIS — E1122 Type 2 diabetes mellitus with diabetic chronic kidney disease: Secondary | ICD-10-CM | POA: Diagnosis not present

## 2019-03-05 DIAGNOSIS — M179 Osteoarthritis of knee, unspecified: Secondary | ICD-10-CM | POA: Diagnosis not present

## 2019-03-05 DIAGNOSIS — I10 Essential (primary) hypertension: Secondary | ICD-10-CM | POA: Diagnosis not present

## 2019-03-05 DIAGNOSIS — Z7984 Long term (current) use of oral hypoglycemic drugs: Secondary | ICD-10-CM | POA: Diagnosis not present

## 2019-03-05 DIAGNOSIS — D509 Iron deficiency anemia, unspecified: Secondary | ICD-10-CM | POA: Diagnosis not present

## 2019-03-05 DIAGNOSIS — Z01818 Encounter for other preprocedural examination: Secondary | ICD-10-CM | POA: Diagnosis not present

## 2019-03-09 ENCOUNTER — Inpatient Hospital Stay: Payer: Medicare Other

## 2019-03-09 ENCOUNTER — Other Ambulatory Visit: Payer: Self-pay

## 2019-03-09 VITALS — BP 122/66 | HR 86 | Temp 98.9°F | Resp 18

## 2019-03-09 DIAGNOSIS — D500 Iron deficiency anemia secondary to blood loss (chronic): Secondary | ICD-10-CM

## 2019-03-09 DIAGNOSIS — D509 Iron deficiency anemia, unspecified: Secondary | ICD-10-CM

## 2019-03-09 DIAGNOSIS — Z79899 Other long term (current) drug therapy: Secondary | ICD-10-CM | POA: Diagnosis not present

## 2019-03-09 DIAGNOSIS — Z886 Allergy status to analgesic agent status: Secondary | ICD-10-CM | POA: Diagnosis not present

## 2019-03-09 DIAGNOSIS — Z885 Allergy status to narcotic agent status: Secondary | ICD-10-CM | POA: Diagnosis not present

## 2019-03-09 DIAGNOSIS — D72829 Elevated white blood cell count, unspecified: Secondary | ICD-10-CM | POA: Diagnosis not present

## 2019-03-09 MED ORDER — SODIUM CHLORIDE 0.9 % IV SOLN
Freq: Once | INTRAVENOUS | Status: AC
  Administered 2019-03-09: 12:00:00 via INTRAVENOUS
  Filled 2019-03-09: qty 250

## 2019-03-09 MED ORDER — SODIUM CHLORIDE 0.9 % IV SOLN
510.0000 mg | Freq: Once | INTRAVENOUS | Status: AC
  Administered 2019-03-09: 510 mg via INTRAVENOUS
  Filled 2019-03-09: qty 17

## 2019-03-09 NOTE — Patient Instructions (Signed)

## 2019-04-26 DIAGNOSIS — D509 Iron deficiency anemia, unspecified: Secondary | ICD-10-CM | POA: Diagnosis not present

## 2019-04-26 DIAGNOSIS — E1122 Type 2 diabetes mellitus with diabetic chronic kidney disease: Secondary | ICD-10-CM | POA: Diagnosis not present

## 2019-04-26 DIAGNOSIS — Z0000 Encounter for general adult medical examination without abnormal findings: Secondary | ICD-10-CM | POA: Diagnosis not present

## 2019-04-26 DIAGNOSIS — I10 Essential (primary) hypertension: Secondary | ICD-10-CM | POA: Diagnosis not present

## 2019-04-26 DIAGNOSIS — N183 Chronic kidney disease, stage 3 (moderate): Secondary | ICD-10-CM | POA: Diagnosis not present

## 2019-04-26 DIAGNOSIS — G894 Chronic pain syndrome: Secondary | ICD-10-CM | POA: Diagnosis not present

## 2019-04-26 DIAGNOSIS — Z23 Encounter for immunization: Secondary | ICD-10-CM | POA: Diagnosis not present

## 2019-04-26 DIAGNOSIS — Z1389 Encounter for screening for other disorder: Secondary | ICD-10-CM | POA: Diagnosis not present

## 2019-04-26 DIAGNOSIS — E782 Mixed hyperlipidemia: Secondary | ICD-10-CM | POA: Diagnosis not present

## 2019-04-26 DIAGNOSIS — M179 Osteoarthritis of knee, unspecified: Secondary | ICD-10-CM | POA: Diagnosis not present

## 2019-04-26 DIAGNOSIS — D72829 Elevated white blood cell count, unspecified: Secondary | ICD-10-CM | POA: Diagnosis not present

## 2019-04-26 DIAGNOSIS — J449 Chronic obstructive pulmonary disease, unspecified: Secondary | ICD-10-CM | POA: Diagnosis not present

## 2019-09-02 ENCOUNTER — Inpatient Hospital Stay: Payer: Medicare Other | Attending: Oncology | Admitting: Oncology

## 2019-09-02 ENCOUNTER — Inpatient Hospital Stay: Payer: Medicare Other

## 2019-09-02 ENCOUNTER — Other Ambulatory Visit: Payer: Self-pay

## 2019-09-02 VITALS — BP 152/68 | HR 81 | Temp 98.5°F | Resp 18 | Ht 63.0 in | Wt 186.2 lb

## 2019-09-02 DIAGNOSIS — D500 Iron deficiency anemia secondary to blood loss (chronic): Secondary | ICD-10-CM

## 2019-09-02 DIAGNOSIS — Z79899 Other long term (current) drug therapy: Secondary | ICD-10-CM | POA: Insufficient documentation

## 2019-09-02 DIAGNOSIS — D72829 Elevated white blood cell count, unspecified: Secondary | ICD-10-CM | POA: Insufficient documentation

## 2019-09-02 DIAGNOSIS — Z7951 Long term (current) use of inhaled steroids: Secondary | ICD-10-CM | POA: Insufficient documentation

## 2019-09-02 DIAGNOSIS — D509 Iron deficiency anemia, unspecified: Secondary | ICD-10-CM | POA: Diagnosis not present

## 2019-09-02 LAB — CBC WITH DIFFERENTIAL (CANCER CENTER ONLY)
Abs Immature Granulocytes: 0.07 K/uL (ref 0.00–0.07)
Basophils Absolute: 0.1 K/uL (ref 0.0–0.1)
Basophils Relative: 1 %
Eosinophils Absolute: 0.4 K/uL (ref 0.0–0.5)
Eosinophils Relative: 3 %
HCT: 38 % (ref 36.0–46.0)
Hemoglobin: 11.5 g/dL — ABNORMAL LOW (ref 12.0–15.0)
Immature Granulocytes: 1 %
Lymphocytes Relative: 14 %
Lymphs Abs: 2 K/uL (ref 0.7–4.0)
MCH: 28.9 pg (ref 26.0–34.0)
MCHC: 30.3 g/dL (ref 30.0–36.0)
MCV: 95.5 fL (ref 80.0–100.0)
Monocytes Absolute: 0.9 K/uL (ref 0.1–1.0)
Monocytes Relative: 6 %
Neutro Abs: 11.2 K/uL — ABNORMAL HIGH (ref 1.7–7.7)
Neutrophils Relative %: 75 %
Platelet Count: 257 K/uL (ref 150–400)
RBC: 3.98 MIL/uL (ref 3.87–5.11)
RDW: 14 % (ref 11.5–15.5)
WBC Count: 14.6 K/uL — ABNORMAL HIGH (ref 4.0–10.5)
nRBC: 0 % (ref 0.0–0.2)

## 2019-09-02 LAB — IRON AND TIBC
Iron: 34 ug/dL — ABNORMAL LOW (ref 41–142)
Saturation Ratios: 12 % — ABNORMAL LOW (ref 21–57)
TIBC: 274 ug/dL (ref 236–444)
UIBC: 240 ug/dL (ref 120–384)

## 2019-09-02 LAB — FERRITIN: Ferritin: 132 ng/mL (ref 11–307)

## 2019-09-02 NOTE — Progress Notes (Signed)
Hematology and Oncology Follow Up Visit  Christina Bennett [id number] 1940/11/20 79 y.o. 09/02/2019 10:37 AM    Principle Diagnosis: 79 year old woman with iron deficiency anemia diagnosed in 2013.  Her anemia is related to poor oral absorption of iron supplements.  Prior therapy: IV iron in the form of Feraheme of total 1000 mg given as needed.  Current therapy: Feraheme 1000 mg given intermittently.  Last treatment given in July 2020.  Interim History: [name] returns today for a follow-up visit.  Since the last visit, she received intravenous iron in July 2020 without any major complications.  She feels reasonably well at this time without any excessive fatigue or tiredness.  She denies recent hospitalizations or illnesses.  Her performance status and quality of life remained excellent.  She does report chronic right knee pain which likely will need to be replaced at this point but she is declining this procedure for the time being.     Medications: Reviewed without changes. Current Outpatient Medications  Medication Sig Dispense Refill  . albuterol (PROVENTIL HFA;VENTOLIN HFA) 108 (90 BASE) MCG/ACT inhaler Inhale 2 puffs into the lungs every 4 (four) hours as needed for wheezing or shortness of breath.     . fluticasone (FLONASE) 50 MCG/ACT nasal spray Place 2 sprays into both nostrils every evening.     . hydrochlorothiazide (HYDRODIURIL) 25 MG tablet Take 25 mg by mouth daily.    Marland Kitchen HYDROcodone-acetaminophen (NORCO/VICODIN) 5-325 MG tablet Take 1 tablet by mouth 2 (two) times daily as needed for moderate pain.    Marland Kitchen losartan (COZAAR) 100 MG tablet Take 100 mg by mouth daily.    Marland Kitchen losartan-hydrochlorothiazide (HYZAAR) 100-25 MG tablet Take 1 tablet by mouth daily.    . montelukast (SINGULAIR) 10 MG tablet Take 10 mg by mouth every evening.   2  . omeprazole (PRILOSEC) 20 MG capsule Take 20 mg by mouth daily.     . simvastatin (ZOCOR) 40 MG tablet Take 40 mg by mouth every evening.     . SYMBICORT 160-4.5 MCG/ACT inhaler Inhale 2 puffs into the lungs every evening.   4  . valACYclovir (VALTREX) 500 MG tablet Take 500 mg by mouth 2 (two) times daily.   0  . Vitamin D, Ergocalciferol, (DRISDOL) 50000 UNITS CAPS Take 50,000 Units by mouth every 14 (fourteen) days.     No current facility-administered medications for this visit.    Allergies:  Allergies  Allergen Reactions  . Aspirin Swelling and Rash    Throat swells.  . Bee Venom Anaphylaxis  . Ibuprofen Shortness Of Breath and Swelling    Throat swells.  Marland Kitchen Percocet [Oxycodone-Acetaminophen] Itching        Physical Exam: Blood pressure (!) 152/68, pulse 81, temperature 98.5 F (36.9 C), temperature source Temporal, resp. rate 18, height 5\' 3"  (1.6 m), weight 186 lb 3.2 oz (84.5 kg), SpO2 97 %.    ECOG: 0   General appearance: Comfortable appearing without any discomfort Head: Normocephalic without any trauma Oropharynx: Mucous membranes are moist and pink without any thrush or ulcers. Eyes: Pupils are equal and round reactive to light. Lymph nodes: No cervical, supraclavicular, inguinal or axillary lymphadenopathy.   Heart:regular rate and rhythm.  S1 and S2 without leg edema. Lung: Clear without any rhonchi or wheezes.  No dullness to percussion. Abdomin: Soft, nontender, nondistended with good bowel sounds.  No hepatosplenomegaly. Musculoskeletal: No joint deformity or effusion.  Full range of motion noted. Neurological: No deficits noted on motor, sensory  and deep tendon reflex exam. Skin: No petechial rash or dryness.  Appeared moist.       Lab Results: Lab Results  Component Value Date   WBC 15.0 (H) 03/02/2019   HGB 10.6 (L) 03/02/2019   HCT 35.3 (L) 03/02/2019   MCV 88.7 03/02/2019   PLT 335 03/02/2019     Chemistry      Component Value Date/Time   NA 136 05/07/2016 0326   NA 141 05/02/2015 0952   K 3.8 05/07/2016 0326   K 4.2 05/02/2015 0952   CL 101 05/07/2016 0326   CL 104  04/23/2012 1017   CO2 23 05/07/2016 0326   CO2 28 05/02/2015 0952   BUN 22 (H) 05/07/2016 0326   BUN 13.0 05/02/2015 0952   CREATININE 1.06 (H) 05/07/2016 0326   CREATININE 0.7 05/02/2015 0952      Component Value Date/Time   CALCIUM 9.2 05/07/2016 0326   CALCIUM 9.4 05/02/2015 0952   ALKPHOS 81 05/07/2016 0326   ALKPHOS 82 05/02/2015 0952   AST 18 05/07/2016 0326   AST 12 05/02/2015 0952   ALT 9 (L) 05/07/2016 0326   ALT 7 05/02/2015 0952   BILITOT 0.8 05/07/2016 0326   BILITOT 0.38 05/02/2015 0952         Results for Christina Bennett (MRN [id number]) as of 09/02/2019 10:39  Ref. Range 03/02/2019 11:11  Iron Latest Ref Range: 41 - 142 ug/dL 29 (L)  UIBC Latest Ref Range: 120 - 384 ug/dL 280  TIBC Latest Ref Range: 236 - 444 ug/dL 309  Saturation Ratios Latest Ref Range: 21 - 57 % 9 (L)  Ferritin Latest Ref Range: 11 - 307 ng/mL 59        Impression and Plan:  79 year old woman with:   1.  Anemia diagnosed in 2013.  She was found to have iron deficiency related to poor oral absorption.  She continues to receive intermittent IV iron infusion with the last treatment given in July 2020.  Iron studies at that time were down to 29 with saturation of 9%.  Her hemoglobin has drifted to 10.6.  After receiving intravenous iron she reports overall generalized improvement.  Risks and benefits of continuing this approach with repeat intravenous iron as needed or discussed.  Complications include arthralgias, myalgias and infusion related issues were reviewed.  Hemoglobin is adequate today with improvement since her last visit.  We will continue to monitor counts and replace iron as needed.  She is agreeable with this plan.  2. Colon cancer screening: No new GI symptoms and she is up-to-date at this time.  3. Leukocytosis: Her total white cell count is slightly down from previously with normal differential.  This appears to be reactive other than a sign of hematological  disorder.   4. Followup: Will return in 6 months for repeat evaluation.  20  minutes was spent on this encounter.  Time was dedicated to reviewing laboratory data, discussing treatment options and potential complication related to therapy.   Roxy Cedar Jimmie Dattilio 1/21/202110:37 AM

## 2019-09-06 DIAGNOSIS — H1712 Central corneal opacity, left eye: Secondary | ICD-10-CM | POA: Diagnosis not present

## 2019-09-06 DIAGNOSIS — H17822 Peripheral opacity of cornea, left eye: Secondary | ICD-10-CM | POA: Diagnosis not present

## 2019-09-06 DIAGNOSIS — H04123 Dry eye syndrome of bilateral lacrimal glands: Secondary | ICD-10-CM | POA: Diagnosis not present

## 2019-09-06 DIAGNOSIS — H18423 Band keratopathy, bilateral: Secondary | ICD-10-CM | POA: Diagnosis not present

## 2019-09-06 DIAGNOSIS — H0102A Squamous blepharitis right eye, upper and lower eyelids: Secondary | ICD-10-CM | POA: Diagnosis not present

## 2019-09-06 DIAGNOSIS — H0102B Squamous blepharitis left eye, upper and lower eyelids: Secondary | ICD-10-CM | POA: Diagnosis not present

## 2019-09-06 DIAGNOSIS — B0052 Herpesviral keratitis: Secondary | ICD-10-CM | POA: Diagnosis not present

## 2019-10-28 DIAGNOSIS — K219 Gastro-esophageal reflux disease without esophagitis: Secondary | ICD-10-CM | POA: Diagnosis not present

## 2019-10-28 DIAGNOSIS — G894 Chronic pain syndrome: Secondary | ICD-10-CM | POA: Diagnosis not present

## 2019-10-28 DIAGNOSIS — M1990 Unspecified osteoarthritis, unspecified site: Secondary | ICD-10-CM | POA: Diagnosis not present

## 2019-10-28 DIAGNOSIS — J449 Chronic obstructive pulmonary disease, unspecified: Secondary | ICD-10-CM | POA: Diagnosis not present

## 2019-10-28 DIAGNOSIS — E782 Mixed hyperlipidemia: Secondary | ICD-10-CM | POA: Diagnosis not present

## 2019-10-28 DIAGNOSIS — I10 Essential (primary) hypertension: Secondary | ICD-10-CM | POA: Diagnosis not present

## 2019-10-28 DIAGNOSIS — E1122 Type 2 diabetes mellitus with diabetic chronic kidney disease: Secondary | ICD-10-CM | POA: Diagnosis not present

## 2019-10-28 DIAGNOSIS — N1831 Chronic kidney disease, stage 3a: Secondary | ICD-10-CM | POA: Diagnosis not present

## 2019-10-28 DIAGNOSIS — E611 Iron deficiency: Secondary | ICD-10-CM | POA: Diagnosis not present

## 2019-11-01 ENCOUNTER — Emergency Department (HOSPITAL_COMMUNITY)
Admission: EM | Admit: 2019-11-01 | Discharge: 2019-11-01 | Disposition: A | Discharge status: Home / Self Care | Payer: Medicare Other | Attending: Emergency Medicine | Admitting: Emergency Medicine

## 2019-11-01 ENCOUNTER — Encounter (HOSPITAL_COMMUNITY): Payer: Self-pay | Admitting: Pharmacy Technician

## 2019-11-01 ENCOUNTER — Other Ambulatory Visit: Payer: Self-pay

## 2019-11-01 ENCOUNTER — Emergency Department (HOSPITAL_COMMUNITY): Payer: Medicare Other

## 2019-11-01 DIAGNOSIS — M545 Low back pain: Secondary | ICD-10-CM | POA: Diagnosis not present

## 2019-11-01 DIAGNOSIS — I2510 Atherosclerotic heart disease of native coronary artery without angina pectoris: Secondary | ICD-10-CM | POA: Diagnosis not present

## 2019-11-01 DIAGNOSIS — M25552 Pain in left hip: Secondary | ICD-10-CM | POA: Insufficient documentation

## 2019-11-01 DIAGNOSIS — E119 Type 2 diabetes mellitus without complications: Secondary | ICD-10-CM | POA: Insufficient documentation

## 2019-11-01 DIAGNOSIS — I10 Essential (primary) hypertension: Secondary | ICD-10-CM | POA: Diagnosis not present

## 2019-11-01 DIAGNOSIS — Z86718 Personal history of other venous thrombosis and embolism: Secondary | ICD-10-CM | POA: Insufficient documentation

## 2019-11-01 DIAGNOSIS — R52 Pain, unspecified: Secondary | ICD-10-CM | POA: Diagnosis not present

## 2019-11-01 DIAGNOSIS — I119 Hypertensive heart disease without heart failure: Secondary | ICD-10-CM | POA: Diagnosis not present

## 2019-11-01 DIAGNOSIS — M5442 Lumbago with sciatica, left side: Secondary | ICD-10-CM

## 2019-11-01 DIAGNOSIS — Z87442 Personal history of urinary calculi: Secondary | ICD-10-CM | POA: Diagnosis not present

## 2019-11-01 DIAGNOSIS — Z7984 Long term (current) use of oral hypoglycemic drugs: Secondary | ICD-10-CM | POA: Insufficient documentation

## 2019-11-01 DIAGNOSIS — M5489 Other dorsalgia: Secondary | ICD-10-CM | POA: Diagnosis not present

## 2019-11-01 DIAGNOSIS — Z79899 Other long term (current) drug therapy: Secondary | ICD-10-CM | POA: Diagnosis not present

## 2019-11-01 MED ORDER — PREDNISONE 50 MG PO TABS: 50.0000 mg | ORAL_TABLET | Freq: Every day | ORAL | 0 refills | Status: AC

## 2019-11-01 MED ORDER — METHOCARBAMOL 500 MG PO TABS: 500.0000 mg | ORAL_TABLET | Freq: Two times a day (BID) | ORAL | 0 refills | Status: DC

## 2019-11-01 MED ORDER — LIDOCAINE 5 % EX PTCH
1.0000 | MEDICATED_PATCH | Freq: Once | CUTANEOUS | Status: DC
  Administered 2019-11-01: 1 via TRANSDERMAL
  Filled 2019-11-01: qty 1

## 2019-11-01 MED ORDER — MORPHINE SULFATE (PF) 4 MG/ML IV SOLN
4.0000 mg | Freq: Once | INTRAVENOUS | Status: AC
  Administered 2019-11-01: 14:00:00 4 mg via INTRAVENOUS
  Filled 2019-11-01: qty 1

## 2019-11-01 MED ORDER — ONDANSETRON HCL 4 MG/2ML IJ SOLN
4.0000 mg | Freq: Once | INTRAMUSCULAR | Status: AC
  Administered 2019-11-01: 4 mg via INTRAVENOUS
  Filled 2019-11-01: qty 2

## 2019-11-01 MED ORDER — LIDOCAINE 5 % EX PTCH: 1.0000 | MEDICATED_PATCH | CUTANEOUS | 0 refills | Status: DC

## 2019-11-01 MED ORDER — METHOCARBAMOL 500 MG PO TABS
500.0000 mg | ORAL_TABLET | Freq: Once | ORAL | Status: AC
  Administered 2019-11-01: 500 mg via ORAL
  Filled 2019-11-01: qty 1

## 2019-11-01 NOTE — ED Provider Notes (Signed)
[facility] EMERGENCY DEPARTMENT Provider Note   CSN: [id number] Arrival date & time: 11/01/19  1153     History Chief Complaint  Patient presents with  . Back Pain    Christina Bennett is a 79 y.o. female.  HPI      Christina Bennett is a 79 y.o. female, with a history of COPD, DM, dyslipidemia, GERD, DVT, presenting to the ED with left lower back pain beginning March 17.  Patient states she bent over to pick up her dog when she felt pain in her lower left back as well as feeling a "pop." Her pain was initially quite manageable with her home narcotic pain medication and actually seemed to be improving. Yesterday, her pain again worsened and is now radiating down the back of the left leg. She is experiencing tightness and muscle spasms in the left lower back and buttocks with sharp pain down the leg.  Pain is severe.  She denies fever/chills, abdominal pain, numbness, weakness, changes in bowel or bladder function, saddle anesthesias, nausea/vomiting, chest pain, or any other complaints.   Past Medical History:  Diagnosis Date  . Anemia    iron insufion on 05/2016   . Atypical chest pain    Atypical CP--stress test, cor angio, and CT chest negative in 2005. Nuclear study normal in 2011   . COPD (chronic obstructive pulmonary disease) ([place])   . Diabetes ([place])    Diabetes type 2- 1/13- metformin started  . Dyslipidemia   . Endometriosis    with ovarian radiation in 1966, gyn Dr cousins  . GERD (gastroesophageal reflux disease)    H/H  . Herpes    herpes of the left eye, Dr Patrice Paradise optho  . History of DVT (deep vein thrombosis)    on OCP  . History of kidney stones   . Hypertension   . Mild anemia    WBC mild high, lab 2012/ HEMATOLOGY   . Osteoarthritis    Osteoarthritis of the hip, knee, and hand, vicodin , prn  . Osteopenia    BD in 2013  . Tobacco use    quit 03/2008  . Vitamin D deficiency     Patient Active Problem List   Diagnosis  Date Noted  . Coronary artery disease involving native heart 10/04/2014  . Chest pain 10/04/2014  . Type II diabetes mellitus with complication ([place]) [id number]  . Hyperlipidemia 10/04/2014  . Anemia 07/19/2014  . Iron deficiency anemia 10/21/2012    Past Surgical History:  Procedure Laterality Date  . APPENDECTOMY    . BALLOON DILATION N/A 07/09/2016   Procedure: BALLOON DILATION;  Surgeon: Garlan Fair, MD;  Location: [facility];  Service: Endoscopy;  Laterality: N/A;  . COLONOSCOPY WITH PROPOFOL N/A 07/09/2016   Procedure: COLONOSCOPY WITH PROPOFOL;  Surgeon: Garlan Fair, MD;  Location: [facility];  Service: Endoscopy;  Laterality: N/A;  . ESOPHAGOGASTRODUODENOSCOPY N/A 07/09/2016   Procedure: ESOPHAGOGASTRODUODENOSCOPY (EGD);  Surgeon: Garlan Fair, MD;  Location: [facility];  Service: Endoscopy;  Laterality: N/A;  . EYE SURGERY     left eye cataract srugery   . HERNIA REPAIR    . surgery fro endometriosis    . TONSILLECTOMY       OB History   No obstetric history on file.     Family History  Problem Relation Age of Onset  . Hypertension Mother   . Heart disease Brother   . Heart disease Brother     Social History  Tobacco Use  . Smoking status: Former Research scientist (life sciences)  . Smokeless tobacco: Never Used  Substance Use Topics  . Alcohol use: No  . Drug use: No    Home Medications Prior to Admission medications   Medication Sig Start Date End Date Taking? Authorizing Provider  albuterol (PROVENTIL HFA;VENTOLIN HFA) 108 (90 BASE) MCG/ACT inhaler Inhale 2 puffs into the lungs every 4 (four) hours as needed for wheezing or shortness of breath.     [provider]  fluticasone (FLONASE) 50 MCG/ACT nasal spray Place 2 sprays into both nostrils every evening.  08/24/14   [provider]  hydrochlorothiazide (HYDRODIURIL) 25 MG tablet Take 25 mg by mouth daily. 10/26/18   [provider]  HYDROcodone-acetaminophen (NORCO) 7.5-325 MG  tablet Take 1 tablet by mouth 2 (two) times daily as needed for moderate pain.     [provider]  lidocaine (LIDODERM) 5 % Place 1 patch onto the skin daily. Remove & Discard patch within 12 hours or as directed by MD 11/01/19   Jamesyn Lindell C, PA-C  losartan (COZAAR) 100 MG tablet Take 100 mg by mouth daily. 10/26/18   [provider]  losartan-hydrochlorothiazide (HYZAAR) 100-25 MG tablet Take 1 tablet by mouth daily.    [provider]  methocarbamol (ROBAXIN) 500 MG tablet Take 1 tablet (500 mg total) by mouth 2 (two) times daily. 11/01/19   Guerry Covington C, PA-C  montelukast (SINGULAIR) 10 MG tablet Take 10 mg by mouth every evening.  01/31/16   [provider]  omeprazole (PRILOSEC) 20 MG capsule Take 20 mg by mouth daily.     [provider]  predniSONE (DELTASONE) 50 MG tablet Take 1 tablet (50 mg total) by mouth daily for 4 days. 11/01/19 11/05/19  Linford Quintela C, PA-C  simvastatin (ZOCOR) 40 MG tablet Take 40 mg by mouth every evening.    [provider]  SYMBICORT 160-4.5 MCG/ACT inhaler Inhale 2 puffs into the lungs every evening.  06/13/14   [provider]  valACYclovir (VALTREX) 500 MG tablet Take 500 mg by mouth 2 (two) times daily.  01/31/16   [provider]  Vitamin D, Ergocalciferol, (DRISDOL) 50000 UNITS CAPS Take 50,000 Units by mouth every 14 (fourteen) days.    [provider]    Allergies    Aspirin, Bee venom, Ibuprofen, and Percocet [oxycodone-acetaminophen]  Review of Systems   Review of Systems  Constitutional: Negative for chills, diaphoresis and fever.  Respiratory: Negative for shortness of breath.   Cardiovascular: Negative for chest pain.  Gastrointestinal: Negative for abdominal pain, nausea and vomiting.  Genitourinary: Negative for difficulty urinating and dysuria.  Musculoskeletal: Positive for back pain.  Neurological: Negative for syncope, weakness and numbness.  All other systems  reviewed and are negative.   Physical Exam Updated Vital Signs BP (!) 156/62 (BP Location: Right Arm)   Pulse 86   Temp 98.2 F (36.8 C) (Oral)   Resp 14   Ht 5\' 3"  (1.6 m)   Wt 86.2 kg   SpO2 93%   BMI 33.66 kg/m   Physical Exam Vitals and nursing note reviewed.  Constitutional:      General: She is not in acute distress.    Appearance: She is well-developed. She is not diaphoretic.  HENT:     Head: Normocephalic and atraumatic.     Mouth/Throat:     Mouth: Mucous membranes are moist.     Pharynx: Oropharynx is clear.  Eyes:     Conjunctiva/sclera:  Conjunctivae normal.  Cardiovascular:     Rate and Rhythm: Normal rate and regular rhythm.     Pulses: Normal pulses.          Radial pulses are 2+ on the right side and 2+ on the left side.       Dorsalis pedis pulses are 2+ on the right side and 2+ on the left side.       Posterior tibial pulses are 2+ on the right side and 2+ on the left side.     Heart sounds: Normal heart sounds.     Comments: Tactile temperature in the extremities appropriate and equal bilaterally. Pulmonary:     Effort: Pulmonary effort is normal. No respiratory distress.     Breath sounds: Normal breath sounds.  Abdominal:     Palpations: Abdomen is soft.     Tenderness: There is no abdominal tenderness. There is no guarding.  Musculoskeletal:     Cervical back: Neck supple.       Back:     Right lower leg: No edema.     Left lower leg: No edema.     Comments: Tenderness in the left lumbar musculature into the left buttocks.  No swelling, color change, or deformity noted.  No tenderness to the anterior or lateral left hip.  No pain in the hip with range of motion. No other tenderness in the left leg.  No swelling, deformity, instability, color change, or increased warmth. No noted visual color difference between the lower extremities.  Normal motor function intact in all extremities. No midline spinal tenderness.   Lymphadenopathy:      Cervical: No cervical adenopathy.  Skin:    General: Skin is warm and dry.  Neurological:     Mental Status: She is alert.     Comments: Sensation to light touch grossly intact in the bilateral lower extremities. Strength 5/5 and equal bilaterally in the lower extremities. Patient was able to ambulate independently, but with slow antalgic gait.  Psychiatric:        Mood and Affect: Mood and affect normal.        Speech: Speech normal.        Behavior: Behavior normal.     ED Results / Procedures / Treatments   Labs (all labs ordered are listed, but only abnormal results are displayed) Labs Reviewed - No data to display  EKG None  Radiology DG Lumbar Spine Complete  Result Date: 11/01/2019 CLINICAL DATA:  Posterior hip and lower back pain. Picked up dog 5 days ago with sensation of something ripped. Sharp pain in left hip radiating down left leg. EXAM: LUMBAR SPINE - COMPLETE 4+ VIEW COMPARISON:  None. FINDINGS: Slight levo scoliotic curvature of the lower lumbar spine. Vertebral body heights are normal. There is no listhesis. The posterior elements are intact. Diffuse endplate spurring with mild disc space narrowing throughout. No fracture. Sacroiliac joints are congruent. Aorto bi-iliac vascular calcifications. IMPRESSION: 1. Mild scoliosis and degenerative change of the lumbar spine without acute abnormality. 2. Aorto bi-iliac vascular calcifications. Aortic Atherosclerosis (ICD10-I70.0). Electronically Signed   By: Keith Rake M.D.   On: 11/01/2019 14:17   DG Hip Unilat W or Wo Pelvis 2-3 Views Left  Result Date: 11/01/2019 CLINICAL DATA:  Posterior hip and lower back pain. Picked up dog 5 days ago with sensation of something ripped. Sharp pain in left hip radiating down left leg. EXAM: DG HIP (WITH OR WITHOUT PELVIS) 2-3V LEFT COMPARISON:  None. FINDINGS: The  cortical margins of the bony pelvis and left hip are intact. No fracture. Pubic symphysis and sacroiliac joints are  congruent. Both femoral heads are well-seated in the respective acetabula. Mild bilateral acetabular spurring, left greater than right. No evidence of avascular necrosis or periosteal reaction. IMPRESSION: Mild degenerative change of the left hip.  No acute fracture. Electronically Signed   By: Keith Rake M.D.   On: 11/01/2019 14:12    Procedures Procedures (including critical care time)  Medications Ordered in ED Medications  lidocaine (LIDODERM) 5 % 1 patch (1 patch Transdermal Patch Applied 11/01/19 1512)  morphine 4 MG/ML injection 4 mg (4 mg Intravenous Given 11/01/19 1336)  ondansetron (ZOFRAN) injection 4 mg (4 mg Intravenous Given 11/01/19 1336)  methocarbamol (ROBAXIN) tablet 500 mg (500 mg Oral Given 11/01/19 1512)    ED Course  I have reviewed the triage vital signs and the nursing notes.  Pertinent labs & imaging results that were available during my care of the patient were reviewed by me and considered in my medical decision making (see chart for details).  Clinical Course as of Nov 01 1639  Mon Nov 01, 2019  1445 Patient voices some improvement in her pain.   [SJ]    Clinical Course User Index [SJ] Ziana Heyliger, Helane Gunther, PA-C   MDM Rules/Calculators/A&P                      Patient presents with several days of pain to the left lower back and buttocks.  She did not have a traumatic origin to her pain, however, due to her age and increased possibility for spontaneous fractures in this age group, x-rays were obtained. Patient does not present with symptoms or physical exam findings suggestive of vascular or acute neurologic deficit.  My suspicion for dissection or neurosurgical emergency, such as cauda equina, is low. I personally reviewed and interpreted the patient's x-rays. No acute abnormalities on x-rays.  The patient was given instructions for home care as well as return precautions. Patient voices understanding of these instructions, accepts the plan, and is  comfortable with discharge.   Findings and plan of care discussed with Gerlene Fee, MD. Dr. Sedonia Small personally evaluated and examined this patient.   Final Clinical Impression(s) / ED Diagnoses Final diagnoses:  Acute left-sided low back pain with left-sided sciatica    Rx / DC Orders ED Discharge Orders         Ordered    methocarbamol (ROBAXIN) 500 MG tablet  2 times daily     11/01/19 1515    lidocaine (LIDODERM) 5 %  Every 24 hours     11/01/19 1515    predniSONE (DELTASONE) 50 MG tablet  Daily     11/01/19 1515           Lorayne Bender, PA-C 11/01/19 1646    Maudie Flakes, MD 11/02/19 [id number]

## 2019-11-01 NOTE — ED Triage Notes (Signed)
Pt arrives via ems from home with L sided back pain. Initial onset Wednesday when pt was picking up her dog. Reports felt a ripping sensation at that time. Worsening of pain onset today. Pain radiates to L hip. No shortening or rotation noted. Pt given 221mcg fentanyl en route. VSS with ems.

## 2019-11-01 NOTE — Discharge Instructions (Addendum)
  Expect your soreness to increase over the next 2-3 days. Take it easy, but do not lay around too much as this may make any stiffness worse.  Pain: Continue to take your home pain medication, as prescribed.  You may add some extra Tylenol (acetaminophen) for a maximum of 4000 mg daily.  Please note each pill of your home pain medication contains 325 mg acetaminophen. Methocarbamol: Methocarbamol (generic for Robaxin) is a muscle relaxer and can help relieve stiff muscles or muscle spasms.  Do not drive or perform other dangerous activities while taking this medication as it can cause drowsiness as well as changes in reaction time and judgement. Lidocaine patches: These are available via either prescription or over-the-counter. The over-the-counter option may be more economical one and are likely just as effective. There are multiple over-the-counter brands, such as Salonpas. Prednisone: Take the prednisone, as prescribed, until finished. If you are a diabetic, please know prednisone can raise your blood sugar temporarily. Ice: May apply ice to the area over the next 24 hours for 15 minutes at a time to reduce pain, inflammation, and swelling, if present. Exercises: Be sure to perform the attached exercises starting with three times a week and working up to performing them daily. This is an essential part of preventing long term problems.  Follow up: Follow up with a primary care provider for any future management of these complaints. Be sure to follow up within 7-10 days. Return: Return to the ED should symptoms worsen.  For prescription assistance, may try using prescription discount sites or apps, such as goodrx.com

## 2019-11-01 NOTE — ED Notes (Signed)
Patient walked from bed to bathroom. Complaint of pain, butt was able to walk.

## 2019-11-15 DIAGNOSIS — M545 Low back pain: Secondary | ICD-10-CM | POA: Diagnosis not present

## 2019-11-16 ENCOUNTER — Other Ambulatory Visit: Payer: Self-pay | Admitting: Sports Medicine

## 2019-11-16 DIAGNOSIS — M545 Low back pain, unspecified: Secondary | ICD-10-CM

## 2019-12-06 ENCOUNTER — Ambulatory Visit
Admission: RE | Admit: 2019-12-06 | Discharge: 2019-12-06 | Disposition: A | Discharge status: Home / Self Care | Payer: Medicare Other | Source: Ambulatory Visit | Attending: Sports Medicine | Admitting: Sports Medicine

## 2019-12-06 DIAGNOSIS — M545 Low back pain, unspecified: Secondary | ICD-10-CM

## 2019-12-07 DIAGNOSIS — H1712 Central corneal opacity, left eye: Secondary | ICD-10-CM | POA: Diagnosis not present

## 2019-12-07 DIAGNOSIS — H02052 Trichiasis without entropian right lower eyelid: Secondary | ICD-10-CM | POA: Diagnosis not present

## 2019-12-07 DIAGNOSIS — B0052 Herpesviral keratitis: Secondary | ICD-10-CM | POA: Diagnosis not present

## 2019-12-08 DIAGNOSIS — Z7984 Long term (current) use of oral hypoglycemic drugs: Secondary | ICD-10-CM | POA: Diagnosis not present

## 2019-12-08 DIAGNOSIS — E119 Type 2 diabetes mellitus without complications: Secondary | ICD-10-CM | POA: Diagnosis not present

## 2019-12-08 DIAGNOSIS — H1830 Unspecified corneal membrane change: Secondary | ICD-10-CM | POA: Diagnosis not present

## 2019-12-08 DIAGNOSIS — B0052 Herpesviral keratitis: Secondary | ICD-10-CM | POA: Diagnosis not present

## 2019-12-09 ENCOUNTER — Other Ambulatory Visit: Payer: Self-pay | Admitting: Sports Medicine

## 2019-12-09 DIAGNOSIS — M1990 Unspecified osteoarthritis, unspecified site: Secondary | ICD-10-CM | POA: Diagnosis not present

## 2019-12-09 DIAGNOSIS — E782 Mixed hyperlipidemia: Secondary | ICD-10-CM | POA: Diagnosis not present

## 2019-12-09 DIAGNOSIS — M179 Osteoarthritis of knee, unspecified: Secondary | ICD-10-CM | POA: Diagnosis not present

## 2019-12-09 DIAGNOSIS — D509 Iron deficiency anemia, unspecified: Secondary | ICD-10-CM | POA: Diagnosis not present

## 2019-12-09 DIAGNOSIS — N1831 Chronic kidney disease, stage 3a: Secondary | ICD-10-CM | POA: Diagnosis not present

## 2019-12-09 DIAGNOSIS — I10 Essential (primary) hypertension: Secondary | ICD-10-CM | POA: Diagnosis not present

## 2019-12-09 DIAGNOSIS — E1122 Type 2 diabetes mellitus with diabetic chronic kidney disease: Secondary | ICD-10-CM | POA: Diagnosis not present

## 2019-12-09 DIAGNOSIS — E119 Type 2 diabetes mellitus without complications: Secondary | ICD-10-CM | POA: Diagnosis not present

## 2019-12-09 DIAGNOSIS — N183 Chronic kidney disease, stage 3 unspecified: Secondary | ICD-10-CM | POA: Diagnosis not present

## 2019-12-09 DIAGNOSIS — J449 Chronic obstructive pulmonary disease, unspecified: Secondary | ICD-10-CM | POA: Diagnosis not present

## 2019-12-09 DIAGNOSIS — M5440 Lumbago with sciatica, unspecified side: Secondary | ICD-10-CM

## 2019-12-10 DIAGNOSIS — Z7984 Long term (current) use of oral hypoglycemic drugs: Secondary | ICD-10-CM | POA: Diagnosis not present

## 2019-12-10 DIAGNOSIS — E119 Type 2 diabetes mellitus without complications: Secondary | ICD-10-CM | POA: Diagnosis not present

## 2019-12-10 DIAGNOSIS — Z961 Presence of intraocular lens: Secondary | ICD-10-CM | POA: Diagnosis not present

## 2019-12-10 DIAGNOSIS — H18892 Other specified disorders of cornea, left eye: Secondary | ICD-10-CM | POA: Diagnosis not present

## 2019-12-13 DIAGNOSIS — E119 Type 2 diabetes mellitus without complications: Secondary | ICD-10-CM | POA: Diagnosis not present

## 2019-12-13 DIAGNOSIS — H168 Other keratitis: Secondary | ICD-10-CM | POA: Diagnosis not present

## 2019-12-13 DIAGNOSIS — Z7984 Long term (current) use of oral hypoglycemic drugs: Secondary | ICD-10-CM | POA: Diagnosis not present

## 2019-12-13 DIAGNOSIS — Z961 Presence of intraocular lens: Secondary | ICD-10-CM | POA: Diagnosis not present

## 2019-12-13 DIAGNOSIS — Z8669 Personal history of other diseases of the nervous system and sense organs: Secondary | ICD-10-CM | POA: Diagnosis not present

## 2019-12-15 DIAGNOSIS — N1831 Chronic kidney disease, stage 3a: Secondary | ICD-10-CM | POA: Diagnosis not present

## 2019-12-15 DIAGNOSIS — E1122 Type 2 diabetes mellitus with diabetic chronic kidney disease: Secondary | ICD-10-CM | POA: Diagnosis not present

## 2019-12-15 DIAGNOSIS — J449 Chronic obstructive pulmonary disease, unspecified: Secondary | ICD-10-CM | POA: Diagnosis not present

## 2019-12-15 DIAGNOSIS — M179 Osteoarthritis of knee, unspecified: Secondary | ICD-10-CM | POA: Diagnosis not present

## 2019-12-15 DIAGNOSIS — E119 Type 2 diabetes mellitus without complications: Secondary | ICD-10-CM | POA: Diagnosis not present

## 2019-12-15 DIAGNOSIS — E782 Mixed hyperlipidemia: Secondary | ICD-10-CM | POA: Diagnosis not present

## 2019-12-15 DIAGNOSIS — I10 Essential (primary) hypertension: Secondary | ICD-10-CM | POA: Diagnosis not present

## 2019-12-15 DIAGNOSIS — M1990 Unspecified osteoarthritis, unspecified site: Secondary | ICD-10-CM | POA: Diagnosis not present

## 2019-12-15 DIAGNOSIS — N183 Chronic kidney disease, stage 3 unspecified: Secondary | ICD-10-CM | POA: Diagnosis not present

## 2019-12-15 DIAGNOSIS — D509 Iron deficiency anemia, unspecified: Secondary | ICD-10-CM | POA: Diagnosis not present

## 2019-12-20 DIAGNOSIS — Z961 Presence of intraocular lens: Secondary | ICD-10-CM | POA: Diagnosis not present

## 2019-12-20 DIAGNOSIS — Z7984 Long term (current) use of oral hypoglycemic drugs: Secondary | ICD-10-CM | POA: Diagnosis not present

## 2019-12-20 DIAGNOSIS — E119 Type 2 diabetes mellitus without complications: Secondary | ICD-10-CM | POA: Diagnosis not present

## 2019-12-20 DIAGNOSIS — H168 Other keratitis: Secondary | ICD-10-CM | POA: Diagnosis not present

## 2019-12-22 ENCOUNTER — Ambulatory Visit
Admission: RE | Admit: 2019-12-22 | Discharge: 2019-12-22 | Disposition: A | Discharge status: Home / Self Care | Payer: Medicare Other | Source: Ambulatory Visit | Attending: Sports Medicine | Admitting: Sports Medicine

## 2019-12-22 DIAGNOSIS — M5126 Other intervertebral disc displacement, lumbar region: Secondary | ICD-10-CM | POA: Diagnosis not present

## 2019-12-22 DIAGNOSIS — M48061 Spinal stenosis, lumbar region without neurogenic claudication: Secondary | ICD-10-CM | POA: Diagnosis not present

## 2019-12-22 DIAGNOSIS — M5440 Lumbago with sciatica, unspecified side: Secondary | ICD-10-CM

## 2019-12-24 DIAGNOSIS — M545 Low back pain: Secondary | ICD-10-CM | POA: Diagnosis not present

## 2019-12-24 DIAGNOSIS — M25552 Pain in left hip: Secondary | ICD-10-CM | POA: Diagnosis not present

## 2020-02-29 ENCOUNTER — Other Ambulatory Visit: Payer: Self-pay | Admitting: Oncology

## 2020-02-29 DIAGNOSIS — D500 Iron deficiency anemia secondary to blood loss (chronic): Secondary | ICD-10-CM

## 2020-03-01 ENCOUNTER — Inpatient Hospital Stay: Payer: Medicare Other | Attending: Oncology | Admitting: Oncology

## 2020-03-01 ENCOUNTER — Inpatient Hospital Stay: Payer: Medicare Other

## 2020-03-01 ENCOUNTER — Other Ambulatory Visit: Payer: Self-pay

## 2020-03-01 VITALS — BP 136/60 | HR 92 | Temp 97.9°F | Resp 18 | Ht 63.0 in | Wt 189.4 lb

## 2020-03-01 DIAGNOSIS — Z885 Allergy status to narcotic agent status: Secondary | ICD-10-CM | POA: Insufficient documentation

## 2020-03-01 DIAGNOSIS — D509 Iron deficiency anemia, unspecified: Secondary | ICD-10-CM | POA: Diagnosis not present

## 2020-03-01 DIAGNOSIS — D72829 Elevated white blood cell count, unspecified: Secondary | ICD-10-CM | POA: Insufficient documentation

## 2020-03-01 DIAGNOSIS — Z79899 Other long term (current) drug therapy: Secondary | ICD-10-CM | POA: Insufficient documentation

## 2020-03-01 DIAGNOSIS — D500 Iron deficiency anemia secondary to blood loss (chronic): Secondary | ICD-10-CM | POA: Diagnosis not present

## 2020-03-01 DIAGNOSIS — J449 Chronic obstructive pulmonary disease, unspecified: Secondary | ICD-10-CM | POA: Insufficient documentation

## 2020-03-01 LAB — CBC WITH DIFFERENTIAL (CANCER CENTER ONLY)
Abs Immature Granulocytes: 0.11 10*3/uL — ABNORMAL HIGH (ref 0.00–0.07)
Basophils Absolute: 0.1 10*3/uL (ref 0.0–0.1)
Basophils Relative: 0 %
Eosinophils Absolute: 0.4 10*3/uL (ref 0.0–0.5)
Eosinophils Relative: 3 %
HCT: 33 % — ABNORMAL LOW (ref 36.0–46.0)
Hemoglobin: 9.9 g/dL — ABNORMAL LOW (ref 12.0–15.0)
Immature Granulocytes: 1 %
Lymphocytes Relative: 13 %
Lymphs Abs: 1.9 10*3/uL (ref 0.7–4.0)
MCH: 27.8 pg (ref 26.0–34.0)
MCHC: 30 g/dL (ref 30.0–36.0)
MCV: 92.7 fL (ref 80.0–100.0)
Monocytes Absolute: 0.9 10*3/uL (ref 0.1–1.0)
Monocytes Relative: 6 %
Neutro Abs: 11.4 10*3/uL — ABNORMAL HIGH (ref 1.7–7.7)
Neutrophils Relative %: 77 %
Platelet Count: 274 10*3/uL (ref 150–400)
RBC: 3.56 MIL/uL — ABNORMAL LOW (ref 3.87–5.11)
RDW: 14.7 % (ref 11.5–15.5)
WBC Count: 14.8 10*3/uL — ABNORMAL HIGH (ref 4.0–10.5)
nRBC: 0 % (ref 0.0–0.2)

## 2020-03-01 LAB — IRON AND TIBC
Iron: 30 ug/dL — ABNORMAL LOW (ref 41–142)
Saturation Ratios: 10 % — ABNORMAL LOW (ref 21–57)
TIBC: 290 ug/dL (ref 236–444)
UIBC: 260 ug/dL (ref 120–384)

## 2020-03-01 LAB — FERRITIN: Ferritin: 45 ng/mL (ref 11–307)

## 2020-03-01 NOTE — Progress Notes (Signed)
Hematology and Oncology Follow Up Visit  Christina Bennett [id number] Sep 29, 1940 79 y.o. 03/01/2020 9:39 AM    Principle Diagnosis: 79 year old woman with anemia diagnosed in 2013.  She has iron deficiency related to poor iron absorption.  Prior therapy: IV iron infusion intermittently.  Current therapy: Feraheme 1000 mg given annually last treatment in July 2020.   Interim History: Christina Bennett is here for a repeat evaluation.  Since the last visit, she denies any recent complaints.  She does report fatigue and tiredness which is chronic in nature.  She does have history of COPD with occasional respiratory exacerbation.  She denies any hematochezia, melena or hemoptysis.     Medications: Updated on review. Current Outpatient Medications  Medication Sig Dispense Refill  . albuterol (PROVENTIL HFA;VENTOLIN HFA) 108 (90 BASE) MCG/ACT inhaler Inhale 2 puffs into the lungs every 4 (four) hours as needed for wheezing or shortness of breath.     . fluticasone (FLONASE) 50 MCG/ACT nasal spray Place 2 sprays into both nostrils every evening.     . hydrochlorothiazide (HYDRODIURIL) 25 MG tablet Take 25 mg by mouth daily.    Marland Kitchen HYDROcodone-acetaminophen (NORCO) 7.5-325 MG tablet Take 1 tablet by mouth 2 (two) times daily as needed for moderate pain.     Marland Kitchen lidocaine (LIDODERM) 5 % Place 1 patch onto the skin daily. Remove & Discard patch within 12 hours or as directed by MD 30 patch 0  . losartan (COZAAR) 100 MG tablet Take 100 mg by mouth daily.    Marland Kitchen losartan-hydrochlorothiazide (HYZAAR) 100-25 MG tablet Take 1 tablet by mouth daily.    . methocarbamol (ROBAXIN) 500 MG tablet Take 1 tablet (500 mg total) by mouth 2 (two) times daily. 20 tablet 0  . montelukast (SINGULAIR) 10 MG tablet Take 10 mg by mouth every evening.   2  . omeprazole (PRILOSEC) 20 MG capsule Take 20 mg by mouth daily.     . simvastatin (ZOCOR) 40 MG tablet Take 40 mg by mouth every evening.    . SYMBICORT 160-4.5 MCG/ACT  inhaler Inhale 2 puffs into the lungs every evening.   4  . valACYclovir (VALTREX) 500 MG tablet Take 500 mg by mouth 2 (two) times daily.   0  . Vitamin D, Ergocalciferol, (DRISDOL) 50000 UNITS CAPS Take 50,000 Units by mouth every 14 (fourteen) days.     No current facility-administered medications for this visit.    Allergies:  Allergies  Allergen Reactions  . Aspirin Swelling and Rash    Throat swells.  . Bee Venom Anaphylaxis  . Ibuprofen Shortness Of Breath and Swelling    Throat swells.  Marland Kitchen Percocet [Oxycodone-Acetaminophen] Itching        Physical Exam: Blood pressure 136/60, pulse 92, temperature 97.9 F (36.6 C), temperature source Temporal, resp. rate 18, height 5\' 3"  (1.6 m), weight 189 lb 6.4 oz (85.9 kg), SpO2 95 %.    ECOG: 0    General appearance: Alert, awake without any distress. Head: Atraumatic without abnormalities Oropharynx: Without any thrush or ulcers. Eyes: No scleral icterus. Lymph nodes: No lymphadenopathy noted in the cervical, supraclavicular, or axillary nodes Heart:regular rate and rhythm, without any murmurs or gallops.   Lung: Clear to auscultation without any rhonchi, wheezes or dullness to percussion. Abdomin: Soft, nontender without any shifting dullness or ascites. Musculoskeletal: No clubbing or cyanosis. Neurological: No motor or sensory deficits. Skin: No rashes or lesions.       Lab Results: Lab Results  Component Value Date  WBC 14.8 (H) 03/01/2020   HGB 9.9 (L) 03/01/2020   HCT 33.0 (L) 03/01/2020   MCV 92.7 03/01/2020   PLT 274 03/01/2020     Chemistry      Component Value Date/Time   NA 136 05/07/2016 0326   NA 141 05/02/2015 0952   K 3.8 05/07/2016 0326   K 4.2 05/02/2015 0952   CL 101 05/07/2016 0326   CL 104 04/23/2012 1017   CO2 23 05/07/2016 0326   CO2 28 05/02/2015 0952   BUN 22 (H) 05/07/2016 0326   BUN 13.0 05/02/2015 0952   CREATININE 1.06 (H) 05/07/2016 0326   CREATININE 0.7 05/02/2015 0952       Component Value Date/Time   CALCIUM 9.2 05/07/2016 0326   CALCIUM 9.4 05/02/2015 0952   ALKPHOS 81 05/07/2016 0326   ALKPHOS 82 05/02/2015 0952   AST 18 05/07/2016 0326   AST 12 05/02/2015 0952   ALT 9 (L) 05/07/2016 0326   ALT 7 05/02/2015 0952   BILITOT 0.8 05/07/2016 0326   BILITOT 0.38 05/02/2015 0952        Results for DAYRIN, STALLONE (MRN [id number]) as of 03/01/2020 09:50  Ref. Range 03/02/2019 11:11 09/02/2019 10:27  Iron Latest Ref Range: 41 - 142 ug/dL 29 (L) 34 (L)  UIBC Latest Ref Range: 120 - 384 ug/dL 280 240  TIBC Latest Ref Range: 236 - 444 ug/dL 309 274  Saturation Ratios Latest Ref Range: 21 - 57 % 9 (L) 12 (L)  Ferritin Latest Ref Range: 11 - 307 ng/mL 59 132        Impression and Plan:  79 year old woman with:   1.  Iron deficiency anemia diagnosed in 2013.  This is related to poor iron absorption.  Laboratory data reviewed today and showed decline in her hemoglobin with iron studies in January 2021 for borderline iron deficiency.  Risks and benefits of restarting intravenous iron were discussed.  Complications that include arthralgias, myalgias and infusion related complications and rarely anaphylaxis.  He is agreeable to continue at this time.  2. Colon cancer screening: Remains up-to-date at this time.  3. Leukocytosis: Stable and currently unchanged.  Appears to be reactive related to her COPD and smoking.   4. Followup: In the immediate future for IV iron and MD follow-up in 6 months.  20  minutes were dedicated to this visit.  The time was spent on updating her disease status, reviewing laboratory data and discussing treatment options and future plan of care reviewed.   Zola Button 7/21/20219:39 AM

## 2020-03-06 DIAGNOSIS — Z961 Presence of intraocular lens: Secondary | ICD-10-CM | POA: Diagnosis not present

## 2020-03-06 DIAGNOSIS — E119 Type 2 diabetes mellitus without complications: Secondary | ICD-10-CM | POA: Diagnosis not present

## 2020-03-06 DIAGNOSIS — Z8669 Personal history of other diseases of the nervous system and sense organs: Secondary | ICD-10-CM | POA: Diagnosis not present

## 2020-03-06 DIAGNOSIS — H02052 Trichiasis without entropian right lower eyelid: Secondary | ICD-10-CM | POA: Diagnosis not present

## 2020-03-06 DIAGNOSIS — H16002 Unspecified corneal ulcer, left eye: Secondary | ICD-10-CM | POA: Diagnosis not present

## 2020-03-06 DIAGNOSIS — H16001 Unspecified corneal ulcer, right eye: Secondary | ICD-10-CM | POA: Diagnosis not present

## 2020-03-06 DIAGNOSIS — H168 Other keratitis: Secondary | ICD-10-CM | POA: Diagnosis not present

## 2020-03-06 DIAGNOSIS — H02053 Trichiasis without entropian right eye, unspecified eyelid: Secondary | ICD-10-CM | POA: Diagnosis not present

## 2020-03-06 DIAGNOSIS — Z7984 Long term (current) use of oral hypoglycemic drugs: Secondary | ICD-10-CM | POA: Diagnosis not present

## 2020-03-07 ENCOUNTER — Telehealth: Payer: Self-pay | Admitting: Oncology

## 2020-03-07 DIAGNOSIS — H02052 Trichiasis without entropian right lower eyelid: Secondary | ICD-10-CM | POA: Diagnosis not present

## 2020-03-07 DIAGNOSIS — Z961 Presence of intraocular lens: Secondary | ICD-10-CM | POA: Diagnosis not present

## 2020-03-07 DIAGNOSIS — H168 Other keratitis: Secondary | ICD-10-CM | POA: Diagnosis not present

## 2020-03-07 DIAGNOSIS — Z7984 Long term (current) use of oral hypoglycemic drugs: Secondary | ICD-10-CM | POA: Diagnosis not present

## 2020-03-07 DIAGNOSIS — E119 Type 2 diabetes mellitus without complications: Secondary | ICD-10-CM | POA: Diagnosis not present

## 2020-03-07 NOTE — Telephone Encounter (Signed)
Scheduled per los, patient has been called and notified. 

## 2020-03-08 ENCOUNTER — Other Ambulatory Visit: Payer: Self-pay

## 2020-03-08 ENCOUNTER — Inpatient Hospital Stay: Payer: Medicare Other

## 2020-03-08 VITALS — BP 127/54 | HR 77 | Temp 98.2°F | Resp 18

## 2020-03-08 DIAGNOSIS — D500 Iron deficiency anemia secondary to blood loss (chronic): Secondary | ICD-10-CM

## 2020-03-08 DIAGNOSIS — D509 Iron deficiency anemia, unspecified: Secondary | ICD-10-CM

## 2020-03-08 DIAGNOSIS — D72829 Elevated white blood cell count, unspecified: Secondary | ICD-10-CM | POA: Diagnosis not present

## 2020-03-08 DIAGNOSIS — Z79899 Other long term (current) drug therapy: Secondary | ICD-10-CM | POA: Diagnosis not present

## 2020-03-08 DIAGNOSIS — J449 Chronic obstructive pulmonary disease, unspecified: Secondary | ICD-10-CM | POA: Diagnosis not present

## 2020-03-08 DIAGNOSIS — Z885 Allergy status to narcotic agent status: Secondary | ICD-10-CM | POA: Diagnosis not present

## 2020-03-08 MED ORDER — SODIUM CHLORIDE 0.9 % IV SOLN
510.0000 mg | Freq: Once | INTRAVENOUS | Status: AC
  Administered 2020-03-08: 510 mg via INTRAVENOUS
  Filled 2020-03-08: qty 510

## 2020-03-08 MED ORDER — SODIUM CHLORIDE 0.9 % IV SOLN
Freq: Once | INTRAVENOUS | Status: AC
  Filled 2020-03-08: qty 250

## 2020-03-08 NOTE — Patient Instructions (Signed)

## 2020-03-13 DIAGNOSIS — H02055 Trichiasis without entropian left lower eyelid: Secondary | ICD-10-CM | POA: Diagnosis not present

## 2020-03-13 DIAGNOSIS — H02052 Trichiasis without entropian right lower eyelid: Secondary | ICD-10-CM | POA: Diagnosis not present

## 2020-03-13 DIAGNOSIS — Z7984 Long term (current) use of oral hypoglycemic drugs: Secondary | ICD-10-CM | POA: Diagnosis not present

## 2020-03-13 DIAGNOSIS — E119 Type 2 diabetes mellitus without complications: Secondary | ICD-10-CM | POA: Diagnosis not present

## 2020-03-13 DIAGNOSIS — Z961 Presence of intraocular lens: Secondary | ICD-10-CM | POA: Diagnosis not present

## 2020-03-13 DIAGNOSIS — H168 Other keratitis: Secondary | ICD-10-CM | POA: Diagnosis not present

## 2020-03-15 ENCOUNTER — Other Ambulatory Visit: Payer: Self-pay

## 2020-03-15 ENCOUNTER — Inpatient Hospital Stay: Payer: Medicare Other | Attending: Oncology

## 2020-03-15 VITALS — BP 117/55 | HR 85 | Temp 98.1°F | Resp 20

## 2020-03-15 DIAGNOSIS — J449 Chronic obstructive pulmonary disease, unspecified: Secondary | ICD-10-CM | POA: Insufficient documentation

## 2020-03-15 DIAGNOSIS — Z79899 Other long term (current) drug therapy: Secondary | ICD-10-CM | POA: Insufficient documentation

## 2020-03-15 DIAGNOSIS — Z885 Allergy status to narcotic agent status: Secondary | ICD-10-CM | POA: Diagnosis not present

## 2020-03-15 DIAGNOSIS — D500 Iron deficiency anemia secondary to blood loss (chronic): Secondary | ICD-10-CM

## 2020-03-15 DIAGNOSIS — D72829 Elevated white blood cell count, unspecified: Secondary | ICD-10-CM | POA: Insufficient documentation

## 2020-03-15 DIAGNOSIS — D509 Iron deficiency anemia, unspecified: Secondary | ICD-10-CM

## 2020-03-15 MED ORDER — SODIUM CHLORIDE 0.9 % IV SOLN
510.0000 mg | Freq: Once | INTRAVENOUS | Status: AC
  Administered 2020-03-15: 510 mg via INTRAVENOUS
  Filled 2020-03-15: qty 510

## 2020-03-15 MED ORDER — SODIUM CHLORIDE 0.9 % IV SOLN
Freq: Once | INTRAVENOUS | Status: AC
  Filled 2020-03-15: qty 250

## 2020-03-15 NOTE — Patient Instructions (Signed)

## 2020-03-20 DIAGNOSIS — E119 Type 2 diabetes mellitus without complications: Secondary | ICD-10-CM | POA: Diagnosis not present

## 2020-03-20 DIAGNOSIS — Z7984 Long term (current) use of oral hypoglycemic drugs: Secondary | ICD-10-CM | POA: Diagnosis not present

## 2020-03-20 DIAGNOSIS — Z961 Presence of intraocular lens: Secondary | ICD-10-CM | POA: Diagnosis not present

## 2020-03-20 DIAGNOSIS — H168 Other keratitis: Secondary | ICD-10-CM | POA: Diagnosis not present

## 2020-03-20 DIAGNOSIS — H02052 Trichiasis without entropian right lower eyelid: Secondary | ICD-10-CM | POA: Diagnosis not present

## 2020-03-23 DIAGNOSIS — E1122 Type 2 diabetes mellitus with diabetic chronic kidney disease: Secondary | ICD-10-CM | POA: Diagnosis not present

## 2020-03-23 DIAGNOSIS — E782 Mixed hyperlipidemia: Secondary | ICD-10-CM | POA: Diagnosis not present

## 2020-03-23 DIAGNOSIS — E119 Type 2 diabetes mellitus without complications: Secondary | ICD-10-CM | POA: Diagnosis not present

## 2020-03-23 DIAGNOSIS — G8929 Other chronic pain: Secondary | ICD-10-CM | POA: Diagnosis not present

## 2020-03-23 DIAGNOSIS — N1831 Chronic kidney disease, stage 3a: Secondary | ICD-10-CM | POA: Diagnosis not present

## 2020-03-23 DIAGNOSIS — J449 Chronic obstructive pulmonary disease, unspecified: Secondary | ICD-10-CM | POA: Diagnosis not present

## 2020-03-23 DIAGNOSIS — M1990 Unspecified osteoarthritis, unspecified site: Secondary | ICD-10-CM | POA: Diagnosis not present

## 2020-03-23 DIAGNOSIS — M179 Osteoarthritis of knee, unspecified: Secondary | ICD-10-CM | POA: Diagnosis not present

## 2020-03-23 DIAGNOSIS — N183 Chronic kidney disease, stage 3 unspecified: Secondary | ICD-10-CM | POA: Diagnosis not present

## 2020-03-23 DIAGNOSIS — I10 Essential (primary) hypertension: Secondary | ICD-10-CM | POA: Diagnosis not present

## 2020-03-23 DIAGNOSIS — D509 Iron deficiency anemia, unspecified: Secondary | ICD-10-CM | POA: Diagnosis not present

## 2020-03-27 DIAGNOSIS — H168 Other keratitis: Secondary | ICD-10-CM | POA: Diagnosis not present

## 2020-03-27 DIAGNOSIS — H02052 Trichiasis without entropian right lower eyelid: Secondary | ICD-10-CM | POA: Diagnosis not present

## 2020-03-27 DIAGNOSIS — Z7984 Long term (current) use of oral hypoglycemic drugs: Secondary | ICD-10-CM | POA: Diagnosis not present

## 2020-03-27 DIAGNOSIS — E119 Type 2 diabetes mellitus without complications: Secondary | ICD-10-CM | POA: Diagnosis not present

## 2020-03-27 DIAGNOSIS — Z8619 Personal history of other infectious and parasitic diseases: Secondary | ICD-10-CM | POA: Diagnosis not present

## 2020-03-27 DIAGNOSIS — Z961 Presence of intraocular lens: Secondary | ICD-10-CM | POA: Diagnosis not present

## 2020-04-03 DIAGNOSIS — H168 Other keratitis: Secondary | ICD-10-CM | POA: Diagnosis not present

## 2020-04-03 DIAGNOSIS — E119 Type 2 diabetes mellitus without complications: Secondary | ICD-10-CM | POA: Diagnosis not present

## 2020-04-03 DIAGNOSIS — Z961 Presence of intraocular lens: Secondary | ICD-10-CM | POA: Diagnosis not present

## 2020-04-03 DIAGNOSIS — Z7984 Long term (current) use of oral hypoglycemic drugs: Secondary | ICD-10-CM | POA: Diagnosis not present

## 2020-04-03 DIAGNOSIS — H02052 Trichiasis without entropian right lower eyelid: Secondary | ICD-10-CM | POA: Diagnosis not present

## 2020-04-03 DIAGNOSIS — H16002 Unspecified corneal ulcer, left eye: Secondary | ICD-10-CM | POA: Diagnosis not present

## 2020-04-03 DIAGNOSIS — Z8619 Personal history of other infectious and parasitic diseases: Secondary | ICD-10-CM | POA: Diagnosis not present

## 2020-04-10 DIAGNOSIS — H02052 Trichiasis without entropian right lower eyelid: Secondary | ICD-10-CM | POA: Diagnosis not present

## 2020-04-10 DIAGNOSIS — H168 Other keratitis: Secondary | ICD-10-CM | POA: Diagnosis not present

## 2020-04-10 DIAGNOSIS — E119 Type 2 diabetes mellitus without complications: Secondary | ICD-10-CM | POA: Diagnosis not present

## 2020-04-10 DIAGNOSIS — Z961 Presence of intraocular lens: Secondary | ICD-10-CM | POA: Diagnosis not present

## 2020-04-10 DIAGNOSIS — Z7984 Long term (current) use of oral hypoglycemic drugs: Secondary | ICD-10-CM | POA: Diagnosis not present

## 2020-04-18 DIAGNOSIS — Z7984 Long term (current) use of oral hypoglycemic drugs: Secondary | ICD-10-CM | POA: Diagnosis not present

## 2020-04-18 DIAGNOSIS — E119 Type 2 diabetes mellitus without complications: Secondary | ICD-10-CM | POA: Diagnosis not present

## 2020-04-18 DIAGNOSIS — Z961 Presence of intraocular lens: Secondary | ICD-10-CM | POA: Diagnosis not present

## 2020-04-18 DIAGNOSIS — H168 Other keratitis: Secondary | ICD-10-CM | POA: Diagnosis not present

## 2020-04-18 DIAGNOSIS — Z8619 Personal history of other infectious and parasitic diseases: Secondary | ICD-10-CM | POA: Diagnosis not present

## 2020-04-18 DIAGNOSIS — Z8669 Personal history of other diseases of the nervous system and sense organs: Secondary | ICD-10-CM | POA: Diagnosis not present

## 2020-04-18 DIAGNOSIS — H02052 Trichiasis without entropian right lower eyelid: Secondary | ICD-10-CM | POA: Diagnosis not present

## 2020-04-26 DIAGNOSIS — E782 Mixed hyperlipidemia: Secondary | ICD-10-CM | POA: Diagnosis not present

## 2020-04-26 DIAGNOSIS — E559 Vitamin D deficiency, unspecified: Secondary | ICD-10-CM | POA: Diagnosis not present

## 2020-04-26 DIAGNOSIS — Z7984 Long term (current) use of oral hypoglycemic drugs: Secondary | ICD-10-CM | POA: Diagnosis not present

## 2020-04-26 DIAGNOSIS — Z23 Encounter for immunization: Secondary | ICD-10-CM | POA: Diagnosis not present

## 2020-04-26 DIAGNOSIS — Z0000 Encounter for general adult medical examination without abnormal findings: Secondary | ICD-10-CM | POA: Diagnosis not present

## 2020-04-26 DIAGNOSIS — I10 Essential (primary) hypertension: Secondary | ICD-10-CM | POA: Diagnosis not present

## 2020-04-26 DIAGNOSIS — E1122 Type 2 diabetes mellitus with diabetic chronic kidney disease: Secondary | ICD-10-CM | POA: Diagnosis not present

## 2020-04-26 DIAGNOSIS — B0052 Herpesviral keratitis: Secondary | ICD-10-CM | POA: Diagnosis not present

## 2020-04-26 DIAGNOSIS — J449 Chronic obstructive pulmonary disease, unspecified: Secondary | ICD-10-CM | POA: Diagnosis not present

## 2020-04-26 DIAGNOSIS — M1990 Unspecified osteoarthritis, unspecified site: Secondary | ICD-10-CM | POA: Diagnosis not present

## 2020-04-26 DIAGNOSIS — Z1389 Encounter for screening for other disorder: Secondary | ICD-10-CM | POA: Diagnosis not present

## 2020-04-26 DIAGNOSIS — N1831 Chronic kidney disease, stage 3a: Secondary | ICD-10-CM | POA: Diagnosis not present

## 2020-04-26 DIAGNOSIS — G894 Chronic pain syndrome: Secondary | ICD-10-CM | POA: Diagnosis not present

## 2020-05-01 DIAGNOSIS — H02053 Trichiasis without entropian right eye, unspecified eyelid: Secondary | ICD-10-CM | POA: Insufficient documentation

## 2020-05-01 DIAGNOSIS — H02056 Trichiasis without entropian left eye, unspecified eyelid: Secondary | ICD-10-CM | POA: Insufficient documentation

## 2020-05-01 DIAGNOSIS — H43392 Other vitreous opacities, left eye: Secondary | ICD-10-CM | POA: Diagnosis not present

## 2020-05-01 DIAGNOSIS — H02055 Trichiasis without entropian left lower eyelid: Secondary | ICD-10-CM | POA: Diagnosis not present

## 2020-05-01 DIAGNOSIS — H02052 Trichiasis without entropian right lower eyelid: Secondary | ICD-10-CM | POA: Diagnosis not present

## 2020-05-01 DIAGNOSIS — Z961 Presence of intraocular lens: Secondary | ICD-10-CM | POA: Diagnosis not present

## 2020-05-01 DIAGNOSIS — H168 Other keratitis: Secondary | ICD-10-CM | POA: Insufficient documentation

## 2020-05-01 DIAGNOSIS — E119 Type 2 diabetes mellitus without complications: Secondary | ICD-10-CM | POA: Diagnosis not present

## 2020-05-01 DIAGNOSIS — H04123 Dry eye syndrome of bilateral lacrimal glands: Secondary | ICD-10-CM | POA: Diagnosis not present

## 2020-05-01 DIAGNOSIS — Z7984 Long term (current) use of oral hypoglycemic drugs: Secondary | ICD-10-CM | POA: Diagnosis not present

## 2020-05-22 DIAGNOSIS — H168 Other keratitis: Secondary | ICD-10-CM | POA: Diagnosis not present

## 2020-05-22 DIAGNOSIS — H43812 Vitreous degeneration, left eye: Secondary | ICD-10-CM | POA: Diagnosis not present

## 2020-05-22 DIAGNOSIS — H43392 Other vitreous opacities, left eye: Secondary | ICD-10-CM | POA: Diagnosis not present

## 2020-05-22 DIAGNOSIS — Z961 Presence of intraocular lens: Secondary | ICD-10-CM | POA: Diagnosis not present

## 2020-05-22 DIAGNOSIS — H02055 Trichiasis without entropian left lower eyelid: Secondary | ICD-10-CM | POA: Diagnosis not present

## 2020-05-22 DIAGNOSIS — Z7984 Long term (current) use of oral hypoglycemic drugs: Secondary | ICD-10-CM | POA: Diagnosis not present

## 2020-05-22 DIAGNOSIS — H02052 Trichiasis without entropian right lower eyelid: Secondary | ICD-10-CM | POA: Diagnosis not present

## 2020-05-22 DIAGNOSIS — E119 Type 2 diabetes mellitus without complications: Secondary | ICD-10-CM | POA: Diagnosis not present

## 2020-05-24 DIAGNOSIS — J441 Chronic obstructive pulmonary disease with (acute) exacerbation: Secondary | ICD-10-CM | POA: Diagnosis not present

## 2020-06-09 DIAGNOSIS — E119 Type 2 diabetes mellitus without complications: Secondary | ICD-10-CM | POA: Diagnosis not present

## 2020-06-09 DIAGNOSIS — Z7984 Long term (current) use of oral hypoglycemic drugs: Secondary | ICD-10-CM | POA: Diagnosis not present

## 2020-06-09 DIAGNOSIS — H02055 Trichiasis without entropian left lower eyelid: Secondary | ICD-10-CM | POA: Diagnosis not present

## 2020-06-09 DIAGNOSIS — H16002 Unspecified corneal ulcer, left eye: Secondary | ICD-10-CM | POA: Diagnosis not present

## 2020-06-09 DIAGNOSIS — H43392 Other vitreous opacities, left eye: Secondary | ICD-10-CM | POA: Diagnosis not present

## 2020-06-09 DIAGNOSIS — H02056 Trichiasis without entropian left eye, unspecified eyelid: Secondary | ICD-10-CM | POA: Diagnosis not present

## 2020-06-09 DIAGNOSIS — H04123 Dry eye syndrome of bilateral lacrimal glands: Secondary | ICD-10-CM | POA: Diagnosis not present

## 2020-06-09 DIAGNOSIS — H02053 Trichiasis without entropian right eye, unspecified eyelid: Secondary | ICD-10-CM | POA: Diagnosis not present

## 2020-06-09 DIAGNOSIS — Z961 Presence of intraocular lens: Secondary | ICD-10-CM | POA: Diagnosis not present

## 2020-06-09 DIAGNOSIS — H02052 Trichiasis without entropian right lower eyelid: Secondary | ICD-10-CM | POA: Diagnosis not present

## 2020-06-09 DIAGNOSIS — H168 Other keratitis: Secondary | ICD-10-CM | POA: Diagnosis not present

## 2020-07-10 DIAGNOSIS — H16002 Unspecified corneal ulcer, left eye: Secondary | ICD-10-CM | POA: Diagnosis not present

## 2020-07-10 DIAGNOSIS — H168 Other keratitis: Secondary | ICD-10-CM | POA: Diagnosis not present

## 2020-07-10 DIAGNOSIS — H02055 Trichiasis without entropian left lower eyelid: Secondary | ICD-10-CM | POA: Diagnosis not present

## 2020-07-10 DIAGNOSIS — H02052 Trichiasis without entropian right lower eyelid: Secondary | ICD-10-CM | POA: Diagnosis not present

## 2020-07-10 DIAGNOSIS — E119 Type 2 diabetes mellitus without complications: Secondary | ICD-10-CM | POA: Diagnosis not present

## 2020-07-10 DIAGNOSIS — H43392 Other vitreous opacities, left eye: Secondary | ICD-10-CM | POA: Diagnosis not present

## 2020-07-10 DIAGNOSIS — Z961 Presence of intraocular lens: Secondary | ICD-10-CM | POA: Diagnosis not present

## 2020-07-10 DIAGNOSIS — Z7984 Long term (current) use of oral hypoglycemic drugs: Secondary | ICD-10-CM | POA: Diagnosis not present

## 2020-07-10 DIAGNOSIS — H04123 Dry eye syndrome of bilateral lacrimal glands: Secondary | ICD-10-CM | POA: Diagnosis not present

## 2020-07-13 DIAGNOSIS — J449 Chronic obstructive pulmonary disease, unspecified: Secondary | ICD-10-CM | POA: Diagnosis not present

## 2020-07-13 DIAGNOSIS — R0609 Other forms of dyspnea: Secondary | ICD-10-CM | POA: Diagnosis not present

## 2020-08-23 ENCOUNTER — Ambulatory Visit (INDEPENDENT_AMBULATORY_CARE_PROVIDER_SITE_OTHER): Payer: Medicare Other | Admitting: Physician Assistant

## 2020-08-23 ENCOUNTER — Other Ambulatory Visit: Payer: Self-pay

## 2020-08-23 ENCOUNTER — Encounter: Payer: Self-pay | Admitting: Physician Assistant

## 2020-08-23 VITALS — BP 126/68 | HR 92 | Ht 63.0 in | Wt 188.6 lb

## 2020-08-23 DIAGNOSIS — R0600 Dyspnea, unspecified: Secondary | ICD-10-CM | POA: Diagnosis not present

## 2020-08-23 DIAGNOSIS — E782 Mixed hyperlipidemia: Secondary | ICD-10-CM

## 2020-08-23 DIAGNOSIS — I2510 Atherosclerotic heart disease of native coronary artery without angina pectoris: Secondary | ICD-10-CM | POA: Diagnosis not present

## 2020-08-23 DIAGNOSIS — I10 Essential (primary) hypertension: Secondary | ICD-10-CM

## 2020-08-23 DIAGNOSIS — R0789 Other chest pain: Secondary | ICD-10-CM | POA: Diagnosis not present

## 2020-08-23 DIAGNOSIS — E118 Type 2 diabetes mellitus with unspecified complications: Secondary | ICD-10-CM

## 2020-08-23 DIAGNOSIS — R0609 Other forms of dyspnea: Secondary | ICD-10-CM

## 2020-08-23 NOTE — Progress Notes (Signed)
Cardiology Office Note:    Date:  08/23/2020   ID:  Christina Bennett, Christina Bennett 06/20/1941, MRN [id number]  PCP:  Wenda Low, MD  [facility] Cardiologist:  Sinclair Grooms, MD  [place] Electrophysiologist:  None   Chief Complaint: 18 months follow up   History of Present Illness:    Christina Bennett is a 80 y.o. 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   Not on ASA due to allergy (throat swelling and itching).  Stress test 10/2017 was low risk study without reversible ischemia. LVEF 61% with normal WM.  Seen by Dr. Tamala Julian 10/15/2017>> follow up PRN. Seen by me 02/2019 for surgical clearance of R total knee replacement. Cleared without any testing.   [name] presented with few months history of progressive worsening dyspnea on exertion.  She was seen by PCP and recommended cardiology work-up greater than pulmonary.  She also reports intermittent chest tightness.  No exertional palpitation.  No regular exercise.  Her breathing has got recently worse with activity.  Reports hard time breathing at laying down.  No lower extremity edema, dizziness, melena or blood in his stool or urine.  She has iron deficiency anemia and follows by oncology and gets iron infusion.  Past Medical History:  Diagnosis Date  . Anemia    iron insufion on 05/2016   . Atypical chest pain    Atypical CP--stress test, cor angio, and CT chest negative in 2005. Nuclear study normal in 2011   . COPD (chronic obstructive pulmonary disease) ([place])   . Diabetes ([place])    Diabetes type 2- 1/13- metformin started  . Dyslipidemia   . Endometriosis    with ovarian radiation in 1966, gyn Dr cousins  . GERD (gastroesophageal reflux disease)    H/H  . Herpes    herpes of the left eye, Dr Patrice Paradise optho  . History of DVT (deep vein thrombosis)    on OCP  . History of kidney stones   . Hypertension   . Mild anemia    WBC  mild high, lab 2012/ HEMATOLOGY   . Osteoarthritis    Osteoarthritis of the hip, knee, and hand, vicodin , prn  . Osteopenia    BD in 2013  . Tobacco use    quit 03/2008  . Vitamin D deficiency     Past Surgical History:  Procedure Laterality Date  . APPENDECTOMY    . BALLOON DILATION N/A 07/09/2016   Procedure: BALLOON DILATION;  Surgeon: Garlan Fair, MD;  Location: [facility];  Service: Endoscopy;  Laterality: N/A;  . COLONOSCOPY WITH PROPOFOL N/A 07/09/2016   Procedure: COLONOSCOPY WITH PROPOFOL;  Surgeon: Garlan Fair, MD;  Location: [facility];  Service: Endoscopy;  Laterality: N/A;  . ESOPHAGOGASTRODUODENOSCOPY N/A 07/09/2016   Procedure: ESOPHAGOGASTRODUODENOSCOPY (EGD);  Surgeon: Garlan Fair, MD;  Location: [facility];  Service: Endoscopy;  Laterality: N/A;  . EYE SURGERY     left eye cataract srugery   . HERNIA REPAIR    . surgery fro endometriosis    . TONSILLECTOMY      Current Medications: No outpatient medications have been marked as taking for the 08/23/20 encounter (Appointment) with Leanor Kail, [place].     Allergies:   Aspirin, Bee venom, Ibuprofen, and Percocet [oxycodone-acetaminophen]   Social History   Socioeconomic History  . Marital status: Single    Spouse name:  Not on file  . Number of children: Not on file  . Years of education: Not on file  . Highest education level: Not on file  Occupational History  . Not on file  Tobacco Use  . Smoking status: Former Research scientist (life sciences)  . Smokeless tobacco: Never Used  Substance and Sexual Activity  . Alcohol use: No  . Drug use: No  . Sexual activity: Not on file  Other Topics Concern  . Not on file  Social History Narrative  . Not on file   Social Determinants of Health   Financial Resource Strain: Not on file  Food Insecurity: Not on file  Transportation Needs: Not on file  Physical Activity: Not on file  Stress: Not on file  Social Connections: Not on file     Family  History: The patient's family history includes Heart disease in her brother and brother; Hypertension in her mother.    ROS:   Please see the history of present illness.    All other systems reviewed and are negative.  EKGs/Labs/Other Studies Reviewed:    The following studies were reviewed today:  Stress test 10/2017  Nuclear stress EF: 61%.  No T wave inversion was noted during stress.  There was no ST segment deviation noted during stress.  This is a low risk study.   No reversible ischemia. LVEF 61% with normal wall motion. This is a low risk study.   EKG:  EKG is ordered today.  The ekg ordered today demonstrates sinus rhythm at rate of 92 bpm  Recent Labs: 03/01/2020: Hemoglobin 9.9; Platelet Count 274  Recent Lipid Panel No results found for: CHOL, TRIG, HDL, CHOLHDL, VLDL, LDLCALC, LDLDIRECT   Physical Exam:    VS:  There were no vitals taken for this visit.    Wt Readings from Last 3 Encounters:  03/01/20 189 lb 6.4 oz (85.9 kg)  11/01/19 190 lb (86.2 kg)  09/02/19 186 lb 3.2 oz (84.5 kg)     GEN: Well nourished, well developed in no acute distress HEENT: Normal NECK: No JVD; No carotid bruits LYMPHATICS: No lymphadenopathy CARDIAC: RRR, no murmurs, rubs, gallops RESPIRATORY:  Clear to auscultation without rales, wheezing or rhonchi  ABDOMEN: Soft, non-tender, non-distended MUSCULOSKELETAL:  No edema; No deformity  SKIN: Warm and dry NEUROLOGIC:  Alert and oriented x 3 PSYCHIATRIC:  Normal affect   ASSESSMENT AND PLAN:   1. dyspnea on exertion -Patient reported few months history of progressively worsening dyspnea on exertion with intermittent chest tightness.  Her symptoms is hard to differentiate.  No wheezing or on exam.  Appears euvolemic.  Her symptoms are multifactorial from deconditioning, underlying COPD and possible CAD/diastolic dysfunction.  After long discussion we will get echocardiogram.  Discussed coronary CT but patient prefers stress  test.  2. Non obstructive CAD - As above.   3. HTN - BP stable on current mediations  4. HLD - Continue Zocor 40mg  qd. Followed by PCP  5. DM - followed by PCP  6. Iron deficiency anemia - Denies bleeding. Followed by oncology . Has appointment with PCP next week, recommended blood work.    Medication Adjustments/Labs and Tests Ordered: Current medicines are reviewed at length with the patient today.  Concerns regarding medicines are outlined above.  No orders of the defined types were placed in this encounter.  No orders of the defined types were placed in this encounter.   There are no Patient Instructions on file for this visit.   Signed, Leanor Kail,  PA  08/23/2020 1:57 PM    [facility]

## 2020-08-23 NOTE — Patient Instructions (Signed)
Medication Instructions:  Your physician recommends that you continue on your current medications as directed. Please refer to the Current Medication list given to you today.  *If you need a refill on your cardiac medications before your next appointment, please call your pharmacy*   Lab Work: None ordered  If you have labs (blood work) drawn today and your tests are completely normal, you will receive your results only by: Marland Kitchen MyChart Message (if you have MyChart) OR . A paper copy in the mail If you have any lab test that is abnormal or we need to change your treatment, we will call you to review the results.   Testing/Procedures: Your physician has requested that you have an echocardiogram. Echocardiography is a painless test that uses sound waves to create images of your heart. It provides your doctor with information about the size and shape of your heart and how well your heart's chambers and valves are working. This procedure takes approximately one hour. There are no restrictions for this procedure.   Your physician has requested that you have a lexiscan myoview. For further information please visit [email or web address]. Please follow instruction sheet, BELOW:    You are scheduled for a Myocardial Perfusion Imaging Please arrive 15 minutes prior to your appointment time for registration and insurance purposes.  The test will take approximately 3 to 4 hours to complete; you may bring reading material.  If someone comes with you to your appointment, they will need to remain in the main lobby due to limited space in the testing area. **If you are pregnant or breastfeeding, please notify the nuclear lab prior to your appointment**  How to prepare for your Myocardial Perfusion Test: . Do not eat or drink 3 hours prior to your test, except you may have water. . Do not consume products containing caffeine (regular or decaffeinated) 12 hours prior to your test. (ex: coffee, chocolate,  sodas, tea). . Do bring a list of your current medications with you.  If not listed below, you may take your medications as normal.  . HOLD HYDROCHLOROTHIAZIDE AND LOSARTAN-HCTZ ON THE MORNING OF YOUR TEST.  YOU MAY TAKE IT AFTERWARDS  . Do wear comfortable clothes (no dresses or overalls) and walking shoes, tennis shoes preferred (No heels or open toe shoes are allowed). . Do NOT wear cologne, perfume, aftershave, or lotions (deodorant is allowed). . If these instructions are not followed, your test will have to be rescheduled.     Follow-Up: At [facility], you and your health needs are our priority.  As part of our continuing mission to provide you with exceptional heart care, we have created designated Provider Care Teams.  These Care Teams include your primary Cardiologist (physician) and Advanced Practice Providers (APPs -  Physician Assistants and Nurse Practitioners) who all work together to provide you with the care you need, when you need it.  We recommend signing up for the patient portal called "MyChart".  Sign up information is provided on this After Visit Summary.  MyChart is used to connect with patients for Virtual Visits (Telemedicine).  Patients are able to view lab/test results, encounter notes, upcoming appointments, etc.  Non-urgent messages can be sent to your provider as well.   To learn more about what you can do with MyChart, go to [email or web address].    Your next appointment:   4 week(s)  The format for your next appointment:   In Person  Provider:   Robbie Lis, PA-C   Other Instructions  Echocardiogram An echocardiogram is a test that uses sound waves (ultrasound) to produce images of the heart. Images from an echocardiogram can provide important information about:  Heart size and shape.  The size and thickness and movement of your heart's walls.  Heart muscle function and strength.  Heart valve function or if you have stenosis. Stenosis is  when the heart valves are too narrow.  If blood is flowing backward through the heart valves (regurgitation).  A tumor or infectious growth around the heart valves.  Areas of heart muscle that are not working well because of poor blood flow or injury from a heart attack.  Aneurysm detection. An aneurysm is a weak or damaged part of an artery wall. The wall bulges out from the normal force of blood pumping through the body. Tell a health care provider about:  Any allergies you have.  All medicines you are taking, including vitamins, herbs, eye drops, creams, and over-the-counter medicines.  Any blood disorders you have.  Any surgeries you have had.  Any medical conditions you have.  Whether you are pregnant or may be pregnant. What are the risks? Generally, this is a safe test. However, problems may occur, including an allergic reaction to dye (contrast) that may be used during the test. What happens before the test? No specific preparation is needed. You may eat and drink normally. What happens during the test?  You will take off your clothes from the waist up and put on a hospital gown.  Electrodes or electrocardiogram (ECG)patches may be placed on your chest. The electrodes or patches are then connected to a device that monitors your heart rate and rhythm.  You will lie down on a table for an ultrasound exam. A gel will be applied to your chest to help sound waves pass through your skin.  A handheld device, called a transducer, will be pressed against your chest and moved over your heart. The transducer produces sound waves that travel to your heart and bounce back (or "echo" back) to the transducer. These sound waves will be captured in real-time and changed into images of your heart that can be viewed on a video monitor. The images will be recorded on a computer and reviewed by your health care provider.  You may be asked to change positions or hold your breath for a short  time. This makes it easier to get different views or better views of your heart.  In some cases, you may receive contrast through an IV in one of your veins. This can improve the quality of the pictures from your heart. The procedure may vary among health care providers and hospitals.   What can I expect after the test? You may return to your normal, everyday life, including diet, activities, and medicines, unless your health care provider tells you not to do that. Follow these instructions at home:  It is up to you to get the results of your test. Ask your health care provider, or the department that is doing the test, when your results will be ready.  Keep all follow-up visits. This is important. Summary  An echocardiogram is a test that uses sound waves (ultrasound) to produce images of the heart.  Images from an echocardiogram can provide important information about the size and shape of your heart, heart muscle function, heart valve function, and other possible heart problems.  You do not need to do anything to prepare before this test. You may eat and drink normally.  After the echocardiogram is completed, you may return to your normal, everyday life, unless your health care provider tells you not to do that. This information is not intended to replace advice given to you by your health care provider. Make sure you discuss any questions you have with your health care provider. Document Revised: 03/21/2020 Document Reviewed: 03/21/2020 Elsevier Patient Education  2021 [employer].

## 2020-08-30 ENCOUNTER — Ambulatory Visit: Payer: Medicare Other | Admitting: Oncology

## 2020-08-30 ENCOUNTER — Other Ambulatory Visit: Payer: Medicare Other

## 2020-09-05 DIAGNOSIS — H168 Other keratitis: Secondary | ICD-10-CM | POA: Diagnosis not present

## 2020-09-05 DIAGNOSIS — H43392 Other vitreous opacities, left eye: Secondary | ICD-10-CM | POA: Diagnosis not present

## 2020-09-05 DIAGNOSIS — H02052 Trichiasis without entropian right lower eyelid: Secondary | ICD-10-CM | POA: Diagnosis not present

## 2020-09-05 DIAGNOSIS — H02055 Trichiasis without entropian left lower eyelid: Secondary | ICD-10-CM | POA: Diagnosis not present

## 2020-09-05 DIAGNOSIS — Z961 Presence of intraocular lens: Secondary | ICD-10-CM | POA: Diagnosis not present

## 2020-09-05 DIAGNOSIS — E119 Type 2 diabetes mellitus without complications: Secondary | ICD-10-CM | POA: Diagnosis not present

## 2020-09-13 ENCOUNTER — Telehealth: Payer: Self-pay

## 2020-09-13 NOTE — Telephone Encounter (Signed)
Spoke with the patient, detailed instructions given. She stated that she would be there for her test. Asked to call back with any questions. Christina Bennett EMTP 

## 2020-09-14 ENCOUNTER — Ambulatory Visit (HOSPITAL_COMMUNITY): Payer: Medicare Other | Attending: Cardiology

## 2020-09-14 ENCOUNTER — Ambulatory Visit (HOSPITAL_BASED_OUTPATIENT_CLINIC_OR_DEPARTMENT_OTHER): Payer: Medicare Other

## 2020-09-14 ENCOUNTER — Other Ambulatory Visit: Payer: Self-pay

## 2020-09-14 DIAGNOSIS — R0609 Other forms of dyspnea: Secondary | ICD-10-CM

## 2020-09-14 DIAGNOSIS — I2510 Atherosclerotic heart disease of native coronary artery without angina pectoris: Secondary | ICD-10-CM

## 2020-09-14 DIAGNOSIS — R0789 Other chest pain: Secondary | ICD-10-CM | POA: Diagnosis not present

## 2020-09-14 DIAGNOSIS — R0600 Dyspnea, unspecified: Secondary | ICD-10-CM

## 2020-09-14 LAB — ECHOCARDIOGRAM COMPLETE
Area-P 1/2: 3.17 cm2
Height: 63 in
S' Lateral: 3.3 cm
Weight: 3008 oz

## 2020-09-14 LAB — MYOCARDIAL PERFUSION IMAGING
LV dias vol: 55 mL (ref 46–106)
LV sys vol: 16 mL
Peak HR: 94 {beats}/min
Rest HR: 84 {beats}/min
SDS: 3
SRS: 1
SSS: 4
TID: 0.87

## 2020-09-14 MED ORDER — TECHNETIUM TC 99M TETROFOSMIN IV KIT
10.4000 | PACK | Freq: Once | INTRAVENOUS | Status: AC | PRN
  Administered 2020-09-14: 10.4 via INTRAVENOUS
  Filled 2020-09-14: qty 11

## 2020-09-14 MED ORDER — REGADENOSON 0.4 MG/5ML IV SOLN
0.4000 mg | Freq: Once | INTRAVENOUS | Status: AC
  Administered 2020-09-14: 0.4 mg via INTRAVENOUS

## 2020-09-14 MED ORDER — TECHNETIUM TC 99M TETROFOSMIN IV KIT
30.8000 | PACK | Freq: Once | INTRAVENOUS | Status: AC | PRN
  Administered 2020-09-14: 30.8 via INTRAVENOUS
  Filled 2020-09-14: qty 31

## 2020-09-20 ENCOUNTER — Inpatient Hospital Stay: Payer: Medicare Other | Attending: Oncology

## 2020-09-20 ENCOUNTER — Other Ambulatory Visit: Payer: Self-pay

## 2020-09-20 ENCOUNTER — Inpatient Hospital Stay (HOSPITAL_BASED_OUTPATIENT_CLINIC_OR_DEPARTMENT_OTHER): Payer: Medicare Other | Admitting: Oncology

## 2020-09-20 VITALS — BP 137/56 | HR 89 | Temp 97.1°F | Resp 18 | Ht 63.0 in | Wt 192.4 lb

## 2020-09-20 DIAGNOSIS — I2510 Atherosclerotic heart disease of native coronary artery without angina pectoris: Secondary | ICD-10-CM | POA: Diagnosis not present

## 2020-09-20 DIAGNOSIS — J449 Chronic obstructive pulmonary disease, unspecified: Secondary | ICD-10-CM | POA: Diagnosis not present

## 2020-09-20 DIAGNOSIS — D500 Iron deficiency anemia secondary to blood loss (chronic): Secondary | ICD-10-CM

## 2020-09-20 DIAGNOSIS — D509 Iron deficiency anemia, unspecified: Secondary | ICD-10-CM | POA: Insufficient documentation

## 2020-09-20 DIAGNOSIS — D72829 Elevated white blood cell count, unspecified: Secondary | ICD-10-CM | POA: Insufficient documentation

## 2020-09-20 LAB — CBC WITH DIFFERENTIAL (CANCER CENTER ONLY)
Abs Immature Granulocytes: 0.06 10*3/uL (ref 0.00–0.07)
Basophils Absolute: 0 10*3/uL (ref 0.0–0.1)
Basophils Relative: 0 %
Eosinophils Absolute: 0.4 10*3/uL (ref 0.0–0.5)
Eosinophils Relative: 3 %
HCT: 38.1 % (ref 36.0–46.0)
Hemoglobin: 11.5 g/dL — ABNORMAL LOW (ref 12.0–15.0)
Immature Granulocytes: 1 %
Lymphocytes Relative: 15 %
Lymphs Abs: 1.8 10*3/uL (ref 0.7–4.0)
MCH: 27.3 pg (ref 26.0–34.0)
MCHC: 30.2 g/dL (ref 30.0–36.0)
MCV: 90.5 fL (ref 80.0–100.0)
Monocytes Absolute: 0.7 10*3/uL (ref 0.1–1.0)
Monocytes Relative: 6 %
Neutro Abs: 9 10*3/uL — ABNORMAL HIGH (ref 1.7–7.7)
Neutrophils Relative %: 75 %
Platelet Count: 234 10*3/uL (ref 150–400)
RBC: 4.21 MIL/uL (ref 3.87–5.11)
RDW: 14.9 % (ref 11.5–15.5)
WBC Count: 12.1 10*3/uL — ABNORMAL HIGH (ref 4.0–10.5)
nRBC: 0 % (ref 0.0–0.2)

## 2020-09-20 LAB — IRON AND TIBC
Iron: 32 ug/dL — ABNORMAL LOW (ref 41–142)
Saturation Ratios: 13 % — ABNORMAL LOW (ref 21–57)
TIBC: 253 ug/dL (ref 236–444)
UIBC: 221 ug/dL (ref 120–384)

## 2020-09-20 LAB — FERRITIN: Ferritin: 158 ng/mL (ref 11–307)

## 2020-09-20 NOTE — Progress Notes (Signed)
Hematology and Oncology Follow Up Visit  Christina Bennett [id number] [birth year] 79 y.o. 09/20/2020 11:51 AM    Principle Diagnosis: 80 year old woman with iron deficiency anemia related to poor iron absorption diagnosed in 2013.     Current therapy: Feraheme 1000 mg given intermittently since her diagnosis.  Last treatment given in August 2021.   Interim History: Christina Bennett presents today for repeat evaluation.  Since her last visit, she received intravenous iron in August 2021 without any major complications.  She has had reported issues with intermittent shortness of breath and dyspnea on exertion related to her COPD.  She is not requiring any oxygen but she feels that she is close to it.  He is not reporting shortness of breath cough or wheezing at rest.  She denies hematochezia, melena or hemoptysis.     Medications: Unchanged on review. Current Outpatient Medications  Medication Sig Dispense Refill  . albuterol (PROVENTIL HFA;VENTOLIN HFA) 108 (90 BASE) MCG/ACT inhaler Inhale 2 puffs into the lungs every 4 (four) hours as needed for wheezing or shortness of breath.     . fluticasone (FLONASE) 50 MCG/ACT nasal spray Place 2 sprays into both nostrils every evening.     . hydrochlorothiazide (HYDRODIURIL) 25 MG tablet Take 25 mg by mouth daily.    Marland Kitchen HYDROcodone-acetaminophen (NORCO) 7.5-325 MG tablet Take 1 tablet by mouth 2 (two) times daily as needed for moderate pain.     Marland Kitchen lidocaine (LIDODERM) 5 % Place 1 patch onto the skin daily. Remove & Discard patch within 12 hours or as directed by MD 30 patch 0  . losartan (COZAAR) 100 MG tablet Take 100 mg by mouth daily.    Marland Kitchen losartan-hydrochlorothiazide (HYZAAR) 100-25 MG tablet Take 1 tablet by mouth daily.    . methocarbamol (ROBAXIN) 500 MG tablet Take 1 tablet (500 mg total) by mouth 2 (two) times daily. 20 tablet 0  . montelukast (SINGULAIR) 10 MG tablet Take 10 mg by mouth every evening.   2  . omeprazole (PRILOSEC) 20 MG capsule Take  20 mg by mouth daily.    . simvastatin (ZOCOR) 40 MG tablet Take 40 mg by mouth every evening.    . SYMBICORT 160-4.5 MCG/ACT inhaler Inhale 2 puffs into the lungs every evening.   4  . valACYclovir (VALTREX) 500 MG tablet Take 500 mg by mouth 2 (two) times daily.   0  . Vitamin D, Ergocalciferol, (DRISDOL) 50000 UNITS CAPS Take 50,000 Units by mouth every 14 (fourteen) days.     No current facility-administered medications for this visit.    Allergies:  Allergies  Allergen Reactions  . Aspirin Swelling and Rash    Throat swells.  . Bee Venom Anaphylaxis  . Ibuprofen Shortness Of Breath and Swelling    Throat swells.  . Percocet [Oxycodone-Acetaminophen] Itching        Physical Exam:  Blood pressure (!) 137/56, pulse 89, temperature (!) 97.1 F (36.2 C), temperature source Tympanic, resp. rate 18, height 5\' 3"  (1.6 m), weight 192 lb 6.4 oz (87.3 kg), SpO2 96 %.    ECOG: 0   General appearance: Comfortable appearing without any discomfort Head: Normocephalic without any trauma Oropharynx: Mucous membranes are moist and pink without any thrush or ulcers. Eyes: Pupils are equal and round reactive to light. Lymph nodes: No cervical, supraclavicular, inguinal or axillary lymphadenopathy.   Heart:regular rate and rhythm.  S1 and S2 without leg edema. Lung: Clear without any rhonchi or wheezes.  No dullness to percussion.  Abdomin: Soft, nontender, nondistended with good bowel sounds.  No hepatosplenomegaly. Musculoskeletal: No joint deformity or effusion.  Full range of motion noted. Neurological: No deficits noted on motor, sensory and deep tendon reflex exam. Skin: No petechial rash or dryness.  Appeared moist.        Lab Results: Lab Results  Component Value Date   WBC 14.8 (H) 03/01/2020   HGB 9.9 (L) 03/01/2020   HCT 33.0 (L) 03/01/2020   MCV 92.7 03/01/2020   PLT 274 03/01/2020     Chemistry      Component Value Date/Time   NA 136 05/07/2016 0326   NA  141 05/02/2015 0952   K 3.8 05/07/2016 0326   K 4.2 05/02/2015 0952   CL 101 05/07/2016 0326   CL 104 04/23/2012 1017   CO2 23 05/07/2016 0326   CO2 28 05/02/2015 0952   BUN 22 (H) 05/07/2016 0326   BUN 13.0 05/02/2015 0952   CREATININE 1.06 (H) 05/07/2016 0326   CREATININE 0.7 05/02/2015 0952      Component Value Date/Time   CALCIUM 9.2 05/07/2016 0326   CALCIUM 9.4 05/02/2015 0952   ALKPHOS 81 05/07/2016 0326   ALKPHOS 82 05/02/2015 0952   AST 18 05/07/2016 0326   AST 12 05/02/2015 0952   ALT 9 (L) 05/07/2016 0326   ALT 7 05/02/2015 0952   BILITOT 0.8 05/07/2016 0326   BILITOT 0.38 05/02/2015 0952         Results for Christina Bennett (MRN [id number]) as of 09/20/2020 11:52  Ref. Range 09/02/2019 10:27 03/01/2020 09:17  Iron Latest Ref Range: 41 - 142 ug/dL 34 (L) 30 (L)  UIBC Latest Ref Range: 120 - 384 ug/dL 240 260  TIBC Latest Ref Range: 236 - 444 ug/dL 274 290  Saturation Ratios Latest Ref Range: 21 - 57 % 12 (L) 10 (L)  Ferritin Latest Ref Range: 11 - 307 ng/mL 132 45        Impression and Plan:  80 year old woman with:   1.  Anemia related to poor iron absorption and chronic deficiency diagnosed in 2013.    She has been receiving annual intravenous iron infusion without any major complications.  Iron studies in July 2021 were reviewed and showed decline of her iron studies and hemoglobin and required intravenous infusion.  Risks and benefits of any future infusions were discussed at this time.  Potential complications that include arthralgias, myalgias and infusion related complications were reiterated.  Hemoglobin today is improved at 11.5 with iron studies currently pending.  We will await the results of her iron studies and replace as needed.  2. Colon cancer screening: He is up-to-date without any GI symptoms to suggest repeat colonoscopy needed.  3. Leukocytosis: Reactive in nature with improving white cell count currently at 12.1.  4.  COPD: She has  follow-up with her primary care physician with consideration for pulmonary evaluation.   5. Followup: She will return in 6 months for repeat evaluation and intravenous iron infusion.  30  minutes were spent on this encounter.  The time was dedicated to reviewing laboratory data, disease status update and treatment options in the future.   Zola Button 2/9/202211:51 AM

## 2020-09-28 ENCOUNTER — Ambulatory Visit: Payer: Medicare Other | Admitting: Physician Assistant

## 2020-10-03 ENCOUNTER — Ambulatory Visit
Admission: RE | Admit: 2020-10-03 | Discharge: 2020-10-03 | Disposition: A | Discharge status: Home / Self Care | Payer: Medicare Other | Source: Ambulatory Visit | Attending: Internal Medicine | Admitting: Internal Medicine

## 2020-10-03 ENCOUNTER — Other Ambulatory Visit: Payer: Self-pay | Admitting: Internal Medicine

## 2020-10-03 DIAGNOSIS — Z7984 Long term (current) use of oral hypoglycemic drugs: Secondary | ICD-10-CM | POA: Diagnosis not present

## 2020-10-03 DIAGNOSIS — E1122 Type 2 diabetes mellitus with diabetic chronic kidney disease: Secondary | ICD-10-CM | POA: Diagnosis not present

## 2020-10-03 DIAGNOSIS — J449 Chronic obstructive pulmonary disease, unspecified: Secondary | ICD-10-CM

## 2020-10-03 DIAGNOSIS — D509 Iron deficiency anemia, unspecified: Secondary | ICD-10-CM | POA: Diagnosis not present

## 2020-10-03 DIAGNOSIS — G894 Chronic pain syndrome: Secondary | ICD-10-CM | POA: Diagnosis not present

## 2020-10-03 DIAGNOSIS — M179 Osteoarthritis of knee, unspecified: Secondary | ICD-10-CM | POA: Diagnosis not present

## 2020-10-03 DIAGNOSIS — I10 Essential (primary) hypertension: Secondary | ICD-10-CM | POA: Diagnosis not present

## 2020-10-03 DIAGNOSIS — N1831 Chronic kidney disease, stage 3a: Secondary | ICD-10-CM | POA: Diagnosis not present

## 2020-11-14 DIAGNOSIS — Z7984 Long term (current) use of oral hypoglycemic drugs: Secondary | ICD-10-CM | POA: Diagnosis not present

## 2020-11-14 DIAGNOSIS — Z961 Presence of intraocular lens: Secondary | ICD-10-CM | POA: Diagnosis not present

## 2020-11-14 DIAGNOSIS — H02055 Trichiasis without entropian left lower eyelid: Secondary | ICD-10-CM | POA: Diagnosis not present

## 2020-11-14 DIAGNOSIS — E119 Type 2 diabetes mellitus without complications: Secondary | ICD-10-CM | POA: Diagnosis not present

## 2020-11-14 DIAGNOSIS — H04123 Dry eye syndrome of bilateral lacrimal glands: Secondary | ICD-10-CM | POA: Diagnosis not present

## 2020-11-14 DIAGNOSIS — H43392 Other vitreous opacities, left eye: Secondary | ICD-10-CM | POA: Diagnosis not present

## 2020-11-14 DIAGNOSIS — H168 Other keratitis: Secondary | ICD-10-CM | POA: Diagnosis not present

## 2020-11-14 DIAGNOSIS — H02052 Trichiasis without entropian right lower eyelid: Secondary | ICD-10-CM | POA: Diagnosis not present

## 2020-12-11 DIAGNOSIS — H43392 Other vitreous opacities, left eye: Secondary | ICD-10-CM | POA: Diagnosis not present

## 2020-12-11 DIAGNOSIS — Z7984 Long term (current) use of oral hypoglycemic drugs: Secondary | ICD-10-CM | POA: Diagnosis not present

## 2020-12-11 DIAGNOSIS — E119 Type 2 diabetes mellitus without complications: Secondary | ICD-10-CM | POA: Diagnosis not present

## 2020-12-11 DIAGNOSIS — Z961 Presence of intraocular lens: Secondary | ICD-10-CM | POA: Diagnosis not present

## 2020-12-11 DIAGNOSIS — H02055 Trichiasis without entropian left lower eyelid: Secondary | ICD-10-CM | POA: Diagnosis not present

## 2020-12-11 DIAGNOSIS — Z8669 Personal history of other diseases of the nervous system and sense organs: Secondary | ICD-10-CM | POA: Diagnosis not present

## 2020-12-11 DIAGNOSIS — H04123 Dry eye syndrome of bilateral lacrimal glands: Secondary | ICD-10-CM | POA: Diagnosis not present

## 2020-12-11 DIAGNOSIS — H02052 Trichiasis without entropian right lower eyelid: Secondary | ICD-10-CM | POA: Diagnosis not present

## 2020-12-11 DIAGNOSIS — H168 Other keratitis: Secondary | ICD-10-CM | POA: Diagnosis not present

## 2021-02-13 DIAGNOSIS — J449 Chronic obstructive pulmonary disease, unspecified: Secondary | ICD-10-CM | POA: Diagnosis not present

## 2021-02-13 DIAGNOSIS — E782 Mixed hyperlipidemia: Secondary | ICD-10-CM | POA: Diagnosis not present

## 2021-02-13 DIAGNOSIS — D72829 Elevated white blood cell count, unspecified: Secondary | ICD-10-CM | POA: Diagnosis not present

## 2021-02-13 DIAGNOSIS — G894 Chronic pain syndrome: Secondary | ICD-10-CM | POA: Diagnosis not present

## 2021-02-13 DIAGNOSIS — E1122 Type 2 diabetes mellitus with diabetic chronic kidney disease: Secondary | ICD-10-CM | POA: Diagnosis not present

## 2021-02-13 DIAGNOSIS — I10 Essential (primary) hypertension: Secondary | ICD-10-CM | POA: Diagnosis not present

## 2021-02-13 DIAGNOSIS — Z7984 Long term (current) use of oral hypoglycemic drugs: Secondary | ICD-10-CM | POA: Diagnosis not present

## 2021-02-13 DIAGNOSIS — N1831 Chronic kidney disease, stage 3a: Secondary | ICD-10-CM | POA: Diagnosis not present

## 2021-02-13 DIAGNOSIS — K219 Gastro-esophageal reflux disease without esophagitis: Secondary | ICD-10-CM | POA: Diagnosis not present

## 2021-02-15 ENCOUNTER — Encounter: Payer: Self-pay | Admitting: Pulmonary Disease

## 2021-02-27 DIAGNOSIS — Z20822 Contact with and (suspected) exposure to covid-19: Secondary | ICD-10-CM | POA: Diagnosis not present

## 2021-03-09 DIAGNOSIS — I10 Essential (primary) hypertension: Secondary | ICD-10-CM | POA: Diagnosis not present

## 2021-03-09 DIAGNOSIS — M5090 Cervical disc disorder, unspecified, unspecified cervical region: Secondary | ICD-10-CM | POA: Diagnosis not present

## 2021-03-21 ENCOUNTER — Inpatient Hospital Stay: Payer: Medicare Other

## 2021-03-21 ENCOUNTER — Inpatient Hospital Stay: Payer: Medicare Other | Attending: Oncology

## 2021-03-21 ENCOUNTER — Inpatient Hospital Stay (HOSPITAL_BASED_OUTPATIENT_CLINIC_OR_DEPARTMENT_OTHER): Payer: Medicare Other | Admitting: Oncology

## 2021-03-21 ENCOUNTER — Other Ambulatory Visit: Payer: Self-pay

## 2021-03-21 VITALS — BP 131/79 | HR 88 | Temp 97.1°F | Resp 18 | Wt 185.5 lb

## 2021-03-21 DIAGNOSIS — Z79899 Other long term (current) drug therapy: Secondary | ICD-10-CM | POA: Insufficient documentation

## 2021-03-21 DIAGNOSIS — D500 Iron deficiency anemia secondary to blood loss (chronic): Secondary | ICD-10-CM

## 2021-03-21 DIAGNOSIS — D72829 Elevated white blood cell count, unspecified: Secondary | ICD-10-CM | POA: Insufficient documentation

## 2021-03-21 DIAGNOSIS — D509 Iron deficiency anemia, unspecified: Secondary | ICD-10-CM | POA: Diagnosis not present

## 2021-03-21 LAB — IRON AND TIBC
Iron: 34 ug/dL — ABNORMAL LOW (ref 41–142)
Saturation Ratios: 12 % — ABNORMAL LOW (ref 21–57)
TIBC: 283 ug/dL (ref 236–444)
UIBC: 249 ug/dL (ref 120–384)

## 2021-03-21 LAB — CBC WITH DIFFERENTIAL (CANCER CENTER ONLY)
Abs Immature Granulocytes: 0.04 10*3/uL (ref 0.00–0.07)
Basophils Absolute: 0.1 10*3/uL (ref 0.0–0.1)
Basophils Relative: 0 %
Eosinophils Absolute: 0.4 10*3/uL (ref 0.0–0.5)
Eosinophils Relative: 3 %
HCT: 39.4 % (ref 36.0–46.0)
Hemoglobin: 12.3 g/dL (ref 12.0–15.0)
Immature Granulocytes: 0 %
Lymphocytes Relative: 14 %
Lymphs Abs: 1.7 10*3/uL (ref 0.7–4.0)
MCH: 28.2 pg (ref 26.0–34.0)
MCHC: 31.2 g/dL (ref 30.0–36.0)
MCV: 90.4 fL (ref 80.0–100.0)
Monocytes Absolute: 0.8 10*3/uL (ref 0.1–1.0)
Monocytes Relative: 6 %
Neutro Abs: 9.1 10*3/uL — ABNORMAL HIGH (ref 1.7–7.7)
Neutrophils Relative %: 77 %
Platelet Count: 238 10*3/uL (ref 150–400)
RBC: 4.36 MIL/uL (ref 3.87–5.11)
RDW: 14.7 % (ref 11.5–15.5)
WBC Count: 12 10*3/uL — ABNORMAL HIGH (ref 4.0–10.5)
nRBC: 0 % (ref 0.0–0.2)

## 2021-03-21 LAB — FERRITIN: Ferritin: 95 ng/mL (ref 11–307)

## 2021-03-21 NOTE — Progress Notes (Signed)
Hematology and Oncology Follow Up Visit  Christina Bennett [id number] 10-Oct-1940 80 y.o. 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    Current therapy: IV iron infusion intermittently.  She received Feraheme 1000 mg given in August 2021.   Interim History: [name] is here for a follow-up visit.  Since the last visit, she reports of feeling well without any major complaints.  She continues to have issues with dyspnea on exertion and cough related to her COPD which is unchanged from previous encounters.  He denies any excessive fatigue and tiredness.  She denies any hematochezia, melena or hemoptysis.  Performance status quality of life remained reasonable.     Medications: Updated on review. Current Outpatient Medications  Medication Sig Dispense Refill   albuterol (PROVENTIL HFA;VENTOLIN HFA) 108 (90 BASE) MCG/ACT inhaler Inhale 2 puffs into the lungs every 4 (four) hours as needed for wheezing or shortness of breath.      fluticasone (FLONASE) 50 MCG/ACT nasal spray Place 2 sprays into both nostrils every evening.      hydrochlorothiazide (HYDRODIURIL) 25 MG tablet Take 25 mg by mouth daily.     HYDROcodone-acetaminophen (NORCO) 7.5-325 MG tablet Take 1 tablet by mouth 2 (two) times daily as needed for moderate pain.      lidocaine (LIDODERM) 5 % Place 1 patch onto the skin daily. Remove & Discard patch within 12 hours or as directed by MD 30 patch 0   losartan (COZAAR) 100 MG tablet Take 100 mg by mouth daily.     losartan-hydrochlorothiazide (HYZAAR) 100-25 MG tablet Take 1 tablet by mouth daily.     methocarbamol (ROBAXIN) 500 MG tablet Take 1 tablet (500 mg total) by mouth 2 (two) times daily. 20 tablet 0   montelukast (SINGULAIR) 10 MG tablet Take 10 mg by mouth every evening.   2   omeprazole (PRILOSEC) 20 MG capsule Take 20 mg by mouth daily.     simvastatin (ZOCOR) 40 MG tablet  Take 40 mg by mouth every evening.     SYMBICORT 160-4.5 MCG/ACT inhaler Inhale 2 puffs into the lungs every evening.   4   valACYclovir (VALTREX) 500 MG tablet Take 500 mg by mouth 2 (two) times daily.   0   Vitamin D, Ergocalciferol, (DRISDOL) 50000 UNITS CAPS Take 50,000 Units by mouth every 14 (fourteen) days.     No current facility-administered medications for this visit.    Allergies:  Allergies  Allergen Reactions   Aspirin Swelling and Rash    Throat swells.   Bee Venom Anaphylaxis   Ibuprofen Shortness Of Breath and Swelling    Throat swells.   Percocet [Oxycodone-Acetaminophen] Itching        Physical Exam:   Blood pressure 131/79, pulse 88, temperature (!) 97.1 F (36.2 C), temperature source Tympanic, resp. rate 18, weight 185 lb 8 oz (84.1 kg), SpO2 97 %.    ECOG: 1    General appearance: Alert, awake without any distress. Head: Atraumatic without abnormalities Oropharynx: Without any thrush or ulcers. Eyes: No scleral icterus. Lymph nodes: No lymphadenopathy noted in the cervical, supraclavicular, or axillary nodes Heart:regular rate and rhythm, without any murmurs or gallops.   Lung: Clear to auscultation without any rhonchi, wheezes or dullness to percussion. Abdomin: Soft, nontender without any shifting dullness or ascites. Musculoskeletal: No clubbing or cyanosis. Neurological: No motor or sensory deficits. Skin: No rashes or lesions.  Lab Results: Lab Results  Component Value Date   WBC 12.1 (H) 09/20/2020   HGB 11.5 (L) 09/20/2020   HCT 38.1 09/20/2020   MCV 90.5 09/20/2020   PLT 234 09/20/2020     Chemistry      Component Value Date/Time   NA 136 05/07/2016 0326   NA 141 05/02/2015 0952   K 3.8 05/07/2016 0326   K 4.2 05/02/2015 0952   CL 101 05/07/2016 0326   CL 104 04/23/2012 1017   CO2 23 05/07/2016 0326   CO2 28 05/02/2015 0952   BUN 22 (H) 05/07/2016 0326   BUN 13.0 05/02/2015 0952   CREATININE 1.06 (H)  05/07/2016 0326   CREATININE 0.7 05/02/2015 0952      Component Value Date/Time   CALCIUM 9.2 05/07/2016 0326   CALCIUM 9.4 05/02/2015 0952   ALKPHOS 81 05/07/2016 0326   ALKPHOS 82 05/02/2015 0952   AST 18 05/07/2016 0326   AST 12 05/02/2015 0952   ALT 9 (L) 05/07/2016 0326   ALT 7 05/02/2015 0952   BILITOT 0.8 05/07/2016 0326   BILITOT 0.38 05/02/2015 0952         Results for REGIS, CAPURRO (MRN [id number]) as of 03/21/2021 09:52  Ref. Range 03/01/2020 09:17 09/20/2020 11:43  Iron Latest Ref Range: 41 - 142 ug/dL 30 (L) 32 (L)  UIBC Latest Ref Range: 120 - 384 ug/dL 260 221  TIBC Latest Ref Range: 236 - 444 ug/dL 290 253  Saturation Ratios Latest Ref Range: 21 - 57 % 10 (L) 13 (L)  Ferritin Latest Ref Range: 11 - 307 ng/mL 45 158         Impression and Plan:  80 year old woman with:   1.  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  She is receiving IV iron infusion as needed with the last treatment given in August 2021.  Laboratory data from today showed a hemoglobin of 12.3 which is an improvement from previous counts.  Iron studies are currently pending.  Risks and benefits of repeating intravenous iron infusion were discussed.  Complications that include arthralgias, myalgias and rarely anaphylaxis were discussed.  At this time, we opted to defer IV iron infusion given her improved hemoglobin and overall feeling well.  We will continue to monitor and reinstitute IV iron infusion as needed.  2. Colon cancer screening: Normal GI symptoms to suggest malignancy.  She is up-to-date on colon cancer screening.  3. Leukocytosis: White cell count is consistent with her baseline.  This appears to be reactive without any evidence of a hematological disorder.  4.  COPD: No recent exacerbation.   5. Followup: In 6 months for repeat evaluation.  30  minutes were dedicated to this visit.  Time spent on reviewing laboratory data, treatment choices,  discussing complications related to her treatment.   Zola Button 8/10/20229:46 AM

## 2021-03-22 ENCOUNTER — Ambulatory Visit (INDEPENDENT_AMBULATORY_CARE_PROVIDER_SITE_OTHER): Payer: Medicare Other | Admitting: Pulmonary Disease

## 2021-03-22 ENCOUNTER — Encounter: Payer: Self-pay | Admitting: Pulmonary Disease

## 2021-03-22 VITALS — BP 128/80 | HR 74 | Temp 97.8°F | Ht 62.5 in | Wt 186.4 lb

## 2021-03-22 DIAGNOSIS — R0609 Other forms of dyspnea: Secondary | ICD-10-CM

## 2021-03-22 DIAGNOSIS — J4540 Moderate persistent asthma, uncomplicated: Secondary | ICD-10-CM | POA: Diagnosis not present

## 2021-03-22 DIAGNOSIS — R0600 Dyspnea, unspecified: Secondary | ICD-10-CM

## 2021-03-22 NOTE — Patient Instructions (Signed)
Use Breztri 2 puffs twice a day with a spacer - I will send an order for you to get one. We will show you how to use it.  We will get labs today. You may be a candidate for injection medications to treat the breathing related to undiagnosed asthma. These labs will hep determine if so.  We will see if the spacer helps with breathing.  Return in 8 weeks for follow up with Christina Bennett

## 2021-03-22 NOTE — Progress Notes (Signed)
$'@Patient't$  ID: Christina Bennett, female    DOB: [birth year], 80 y.o.   MRN: [id number]  Chief Complaint  Patient presents with   Consult    COPD since before 2000, more winded now, esp at night    Referring provider: Wenda Low, MD  HPI:   80 y.o. woman whom we are seeing in consultation for evaluation of shortness of breath, dyspnea exertion, COPD.  PCP note reviewed.  Most recent hematology note reviewed.  Patient was diagnosed with COPD about 20 years ago.  She describes spirometry or breathing tests as how this was diagnosed.  Around the time she quit smoking.  Since then she describes gradual worsening of dyspnea on exertion.  She is on Breztri although has not been on it very long.  Thinks it is helped some.  She gets dyspneic walking around the house on flat surfaces as well as outdoors.  Inclines or stairs are worse.  No other alleviating or exacerbating factors she can identify.  No environmental or seasonal change.  No position.  In general, she breathes okay at night but often wakes up at night short of breath.  She has to walk around her get up and sit outside to help catch her breath.  She does feel a bit tight in her chest at these times.  Uses albuterol and occasionally it does help with the symptoms.  Chest x-ray 09/2020 reviewed interpreted as clear lungs bilaterally, no evidence of hyperinflation, pneumothorax, infiltrate.  Nuclear stress test 09/2020 reportedly normal on review.  TTE 09/2020 with low normal RV function, no RA or RV dilation, inadequate TR jet.  PMH: Tobacco abuse in remission, COPD, hypertension Surgical history: appendectomy, hernia repair, tonsillectomy Family history: Mother with hypertension, brother with CAD Social history: Former smoker, quit early 2000's, [place]   Questionaires / Pulmonary Flowsheets:   ACT:  No flowsheet data found.  MMRC: No flowsheet data found.  Epworth:  No flowsheet data found.  Tests:   FENO:  No  results found for: NITRICOXIDE  PFT: No flowsheet data found.  WALK:  No flowsheet data found.  Imaging: Personally reviewed and as per EMR  Lab Results: Personally reviewed, eosinophils routinely 400 or higher CBC    Component Value Date/Time   WBC 12.0 (H) 03/21/2021 0943   WBC 12.8 (H) 09/05/2017 0952   RBC 4.36 03/21/2021 0943   HGB 12.3 03/21/2021 0943   HGB 11.1 (L) 03/05/2017 0856   HCT 39.4 03/21/2021 0943   HCT 35.4 03/05/2017 0856   PLT 238 03/21/2021 0943   PLT 196 03/05/2017 0856   MCV 90.4 03/21/2021 0943   MCV 86.7 03/05/2017 0856   MCH 28.2 03/21/2021 0943   MCHC 31.2 03/21/2021 0943   RDW 14.7 03/21/2021 0943   RDW 18.2 (H) 03/05/2017 0856   LYMPHSABS 1.7 03/21/2021 0943   LYMPHSABS 1.5 03/05/2017 0856   MONOABS 0.8 03/21/2021 0943   MONOABS 1.0 (H) 03/05/2017 0856   EOSABS 0.4 03/21/2021 0943   EOSABS 0.2 03/05/2017 0856   BASOSABS 0.1 03/21/2021 0943   BASOSABS 0.1 03/05/2017 0856    BMET    Component Value Date/Time   NA 136 05/07/2016 0326   NA 141 05/02/2015 0952   K 3.8 05/07/2016 0326   K 4.2 05/02/2015 0952   CL 101 05/07/2016 0326   CL 104 04/23/2012 1017   CO2 23 05/07/2016 0326   CO2 28 05/02/2015 0952   GLUCOSE 149 (H) 05/07/2016 0326   GLUCOSE 103  05/02/2015 0952   GLUCOSE 121 (H) 04/23/2012 1017   BUN 22 (H) 05/07/2016 0326   BUN 13.0 05/02/2015 0952   CREATININE 1.06 (H) 05/07/2016 0326   CREATININE 0.7 05/02/2015 0952   CALCIUM 9.2 05/07/2016 0326   CALCIUM 9.4 05/02/2015 0952   GFRNONAA 51 (L) 05/07/2016 0326   GFRAA 59 (L) 05/07/2016 0326    BNP No results found for: BNP  ProBNP No results found for: PROBNP  Specialty Problems   None   Allergies  Allergen Reactions   Aspirin Swelling and Rash    Throat swells.   Bee Venom Anaphylaxis   Ibuprofen Shortness Of Breath and Swelling    Throat swells.   Percocet [Oxycodone-Acetaminophen] Itching    Immunization History  Administered Date(s) Administered    Influenza Split 06/16/2014   Influenza, High Dose Seasonal PF 06/11/2017    Past Medical History:  Diagnosis Date   Anemia    iron insufion on 05/2016    Atypical chest pain    Atypical CP--stress test, cor angio, and CT chest negative in 2005. Nuclear study normal in 2011    COPD (chronic obstructive pulmonary disease) ([place])    Diabetes ([place])    Diabetes type 2- 1/13- metformin started   Dyslipidemia    Endometriosis    with ovarian radiation in 1966, gyn Dr cousins   GERD (gastroesophageal reflux disease)    H/H   Herpes    herpes of the left eye, Dr Patrice Paradise optho   History of DVT (deep vein thrombosis)    on OCP   History of kidney stones    Hypertension    Mild anemia    WBC mild high, lab 2012/ HEMATOLOGY    Osteoarthritis    Osteoarthritis of the hip, knee, and hand, vicodin , prn   Osteopenia    BD in 2013   Tobacco use    quit 03/2008   Vitamin D deficiency     Tobacco History: Social History   Tobacco Use  Smoking Status Former  Smokeless Tobacco Never   Counseling given: Yes   Continue to not smoke  Outpatient Encounter Medications as of 03/22/2021  Medication Sig   albuterol (PROVENTIL HFA;VENTOLIN HFA) 108 (90 BASE) MCG/ACT inhaler Inhale 2 puffs into the lungs every 4 (four) hours as needed for wheezing or shortness of breath.    BREZTRI AEROSPHERE 160-9-4.8 MCG/ACT AERO Inhale 2 puffs into the lungs 2 (two) times daily.   fluticasone (FLONASE) 50 MCG/ACT nasal spray Place 2 sprays into both nostrils every evening.    hydrochlorothiazide (HYDRODIURIL) 25 MG tablet Take 25 mg by mouth daily.   HYDROcodone-acetaminophen (NORCO) 7.5-325 MG tablet Take 1 tablet by mouth 2 (two) times daily as needed for moderate pain.    lidocaine (LIDODERM) 5 % Place 1 patch onto the skin daily. Remove & Discard patch within 12 hours or as directed by MD   losartan (COZAAR) 100 MG tablet Take 100 mg by mouth daily.   methocarbamol (ROBAXIN) 500 MG tablet Take 1 tablet  (500 mg total) by mouth 2 (two) times daily.   montelukast (SINGULAIR) 10 MG tablet Take 10 mg by mouth every evening.    omeprazole (PRILOSEC) 20 MG capsule Take 20 mg by mouth daily.   simvastatin (ZOCOR) 40 MG tablet Take 40 mg by mouth every evening.   valACYclovir (VALTREX) 500 MG tablet Take 500 mg by mouth 2 (two) times daily.    Vitamin D, Ergocalciferol, (DRISDOL) 50000 UNITS CAPS Take 50,000  Units by mouth every 14 (fourteen) days.   [DISCONTINUED] losartan-hydrochlorothiazide (HYZAAR) 100-25 MG tablet Take 1 tablet by mouth daily.   [DISCONTINUED] SYMBICORT 160-4.5 MCG/ACT inhaler Inhale 2 puffs into the lungs every evening.    No facility-administered encounter medications on file as of 03/22/2021.     Review of Systems  Review of Systems  No chest pain exertion.  No orthopnea or PND.  Denies lower extremity swelling.  Comprehensive review of systems otherwise negative. Physical Exam  BP 128/80 (BP Location: Left Arm, Cuff Size: Normal)   Pulse 74   Temp 97.8 F (36.6 C) (Oral)   Ht 5' 2.5" (1.588 m)   Wt 186 lb 6.4 oz (84.6 kg)   SpO2 94%   BMI 33.55 kg/m   Wt Readings from Last 5 Encounters:  03/22/21 186 lb 6.4 oz (84.6 kg)  03/21/21 185 lb 8 oz (84.1 kg)  09/20/20 192 lb 6.4 oz (87.3 kg)  09/14/20 188 lb (85.3 kg)  08/23/20 188 lb 9.6 oz (85.5 kg)    BMI Readings from Last 5 Encounters:  03/22/21 33.55 kg/m  03/21/21 32.86 kg/m  09/20/20 34.08 kg/m  09/14/20 33.30 kg/m  08/23/20 33.41 kg/m     Physical Exam General: Sitting in chair, no acute distress Eyes: EOMI, icterus Neck: Supple, no JVP appreciated, Cardiovascular: Regular rate and rhythm, no murmur Pulmonary: Clear to auscultation, distant, normal work of breathing Abdomen: Single bowel sounds present MSK: No synovitis, no joint effusion Neuro: Normal gait, no weakness Psych: Normal mood, full affect   Assessment & Plan:   Dyspnea on exertion: Likely multifactorial related to COPD,  concern for uncontrolled asthma, weight, deconditioning.  COPD: Diagnosed by what she describes spirometry around early 2000's.  Cannot rule the results.  Consider repeat PFTs in the future although unlikely to change management at this time.  Asthma: Longstanding diagnosis per patient.  She has uncontrolled dyspnea.  Symptoms worse at night.  Chronically elevated eosinophils.  She is already on Breztri therapy.  We will add spacer to [facility] therapy and to continue 2 puffs twice daily.  Will obtain IgE, CBC with differential, RAST panel today anticipation of consideration of biologic therapy in the future.   Nocturnal shortness of breath: Suspect related to uncontrolled asthma symptoms.  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  Addition, concern for sleep disordered breathing.  If symptoms not improved with interventions above, consider additional investigation of these etiologies.  Return in about 8 weeks (around 05/17/2021).   Lanier Clam, MD 03/22/2021

## 2021-03-23 LAB — IGE: IgE (Immunoglobulin E), Serum: 169 kU/L — ABNORMAL HIGH (ref <=114)

## 2021-03-23 NOTE — Addendum Note (Signed)
Addended by: Valerie Salts on: 03/23/2021 03:48 PM   Modules accepted: Orders

## 2021-03-27 LAB — ALLERGEN PROFILE, PERENNIAL ALLERGEN IGE
Alternaria Alternata IgE: <0.1 kU/L
Aspergillus Fumigatus IgE: <0.1 kU/L
Aureobasidi Pullulans IgE: <0.1 kU/L
Candida Albicans IgE: 0.17 kU/L — AB
Cat Dander IgE: <0.1 kU/L
Chicken Feathers IgE: <0.1 kU/L
Cladosporium Herbarum IgE: <0.1 kU/L
Cow Dander IgE: <0.1 kU/L
D Farinae IgE: 0.1 kU/L — AB
D Pteronyssinus IgE: 0.1 kU/L — AB
Dog Dander IgE: <0.1 kU/L
Duck Feathers IgE: <0.1 kU/L
Goose Feathers IgE: <0.1 kU/L
Mouse Urine IgE: <0.1 kU/L
Mucor Racemosus IgE: <0.1 kU/L
Penicillium Chrysogen IgE: <0.1 kU/L
Phoma Betae IgE: <0.1 kU/L
Setomelanomma Rostrat: <0.1 kU/L
Stemphylium Herbarum IgE: <0.1 kU/L

## 2021-03-28 ENCOUNTER — Ambulatory Visit: Payer: Medicare Other

## 2021-04-05 DIAGNOSIS — E782 Mixed hyperlipidemia: Secondary | ICD-10-CM | POA: Diagnosis not present

## 2021-04-05 DIAGNOSIS — J441 Chronic obstructive pulmonary disease with (acute) exacerbation: Secondary | ICD-10-CM | POA: Diagnosis not present

## 2021-04-05 DIAGNOSIS — I10 Essential (primary) hypertension: Secondary | ICD-10-CM | POA: Diagnosis not present

## 2021-04-05 DIAGNOSIS — N183 Chronic kidney disease, stage 3 unspecified: Secondary | ICD-10-CM | POA: Diagnosis not present

## 2021-04-05 DIAGNOSIS — M1990 Unspecified osteoarthritis, unspecified site: Secondary | ICD-10-CM | POA: Diagnosis not present

## 2021-04-05 DIAGNOSIS — G8929 Other chronic pain: Secondary | ICD-10-CM | POA: Diagnosis not present

## 2021-04-05 DIAGNOSIS — K219 Gastro-esophageal reflux disease without esophagitis: Secondary | ICD-10-CM | POA: Diagnosis not present

## 2021-04-05 DIAGNOSIS — D509 Iron deficiency anemia, unspecified: Secondary | ICD-10-CM | POA: Diagnosis not present

## 2021-04-05 DIAGNOSIS — E1122 Type 2 diabetes mellitus with diabetic chronic kidney disease: Secondary | ICD-10-CM | POA: Diagnosis not present

## 2021-04-05 DIAGNOSIS — E119 Type 2 diabetes mellitus without complications: Secondary | ICD-10-CM | POA: Diagnosis not present

## 2021-04-05 DIAGNOSIS — J449 Chronic obstructive pulmonary disease, unspecified: Secondary | ICD-10-CM | POA: Diagnosis not present

## 2021-05-17 DIAGNOSIS — Z23 Encounter for immunization: Secondary | ICD-10-CM | POA: Diagnosis not present

## 2021-05-25 ENCOUNTER — Ambulatory Visit: Payer: Medicare Other | Admitting: Pulmonary Disease

## 2021-06-14 ENCOUNTER — Ambulatory Visit (INDEPENDENT_AMBULATORY_CARE_PROVIDER_SITE_OTHER): Payer: Medicare Other | Admitting: Pulmonary Disease

## 2021-06-14 ENCOUNTER — Other Ambulatory Visit: Payer: Self-pay

## 2021-06-14 ENCOUNTER — Encounter: Payer: Self-pay | Admitting: Pulmonary Disease

## 2021-06-14 VITALS — BP 112/68 | HR 78 | Ht 62.5 in | Wt 187.6 lb

## 2021-06-14 DIAGNOSIS — R0981 Nasal congestion: Secondary | ICD-10-CM

## 2021-06-14 DIAGNOSIS — J4540 Moderate persistent asthma, uncomplicated: Secondary | ICD-10-CM

## 2021-06-14 NOTE — Patient Instructions (Signed)
Nice to see you again  Check with spray you started using recently  Use a sinus rinse twice a day - follow directions on the box - can get at the drug store  Use flonase 1 spray each nostril twice a day for 7 days then use daily  Use Afrin 1 spray each nostril twice a day for 3 days then stop  Continue all other medications

## 2021-06-14 NOTE — Progress Notes (Signed)
@Patient  ID: Christina Bennett, female    DOB: 08/27/1940, 80 y.o.   MRN: [id number]  Chief Complaint  Patient presents with   Follow-up    2 mo f/u. States her breathing has not changed since last visit. Still wheezing more at night.     Referring provider: Wenda Low, MD  HPI:   80 y.o. woman whom we are seeing in follow-up for evaluation of shortness of breath, dyspnea exertion, wheeze felt largely related to uncontrolled asthma.  Overall, things unchanged.  At last visit tried adding spacer to [facility] therapy seems improved.  Unfortunately, no improvement.  More nocturnal symptoms.  Wheeze.  She really thinks most of it is due to nasal congestion.  Feeling stopped up.  Started the over-the-counter nasal spray within the last week.  He is unsure which one.  Using at night.  Discussed at length my concern for uncontrolled asthma symptoms presented with nocturnal symptoms, wheezing.  Counseled on the rationale and role for biologic therapy which I think she would benefit from, chronic elevation in eosinophils.  She does not want to pursue this at this time.  He is worried about side effects, allergic reaction to medication.  Leery of needles.  HPI initial visit Patient was diagnosed with COPD about 20 years ago.  She describes spirometry or breathing tests as how this was diagnosed.  Around the time she quit smoking.  Since then she describes gradual worsening of dyspnea on exertion.  She is on Breztri although has not been on it very long.  Thinks it is helped some.  She gets dyspneic walking around the house on flat surfaces as well as outdoors.  Inclines or stairs are worse.  No other alleviating or exacerbating factors she can identify.  No environmental or seasonal change.  No position.  In general, she breathes okay at night but often wakes up at night short of breath.  She has to walk around her get up and sit outside to help catch her breath.  She does feel a bit tight in her chest at  these times.  Uses albuterol and occasionally it does help with the symptoms.  Chest x-ray 09/2020 reviewed interpreted as clear lungs bilaterally, no evidence of hyperinflation, pneumothorax, infiltrate.  Nuclear stress test 09/2020 reportedly normal on review.  TTE 09/2020 with low normal RV function, no RA or RV dilation, inadequate TR jet.  PMH: Tobacco abuse in remission, COPD, hypertension Surgical history: appendectomy, hernia repair, tonsillectomy Family history: Mother with hypertension, brother with CAD Social history: Former smoker, quit early 2000's, [place]   Questionaires / Pulmonary Flowsheets:   ACT:  No flowsheet data found.  MMRC: No flowsheet data found.  Epworth:  No flowsheet data found.  Tests:   FENO:  No results found for: NITRICOXIDE  PFT: No flowsheet data found.  WALK:  No flowsheet data found.  Imaging: Personally reviewed and as per EMR  Lab Results: Personally reviewed, eosinophils routinely 400 or higher CBC    Component Value Date/Time   WBC 12.0 (H) 03/21/2021 0943   WBC 12.8 (H) 09/05/2017 0952   RBC 4.36 03/21/2021 0943   HGB 12.3 03/21/2021 0943   HGB 11.1 (L) 03/05/2017 0856   HCT 39.4 03/21/2021 0943   HCT 35.4 03/05/2017 0856   PLT 238 03/21/2021 0943   PLT 196 03/05/2017 0856   MCV 90.4 03/21/2021 0943   MCV 86.7 03/05/2017 0856   MCH 28.2 03/21/2021 0943   MCHC 31.2 03/21/2021 0943  RDW 14.7 03/21/2021 0943   RDW 18.2 (H) 03/05/2017 0856   LYMPHSABS 1.7 03/21/2021 0943   LYMPHSABS 1.5 03/05/2017 0856   MONOABS 0.8 03/21/2021 0943   MONOABS 1.0 (H) 03/05/2017 0856   EOSABS 0.4 03/21/2021 0943   EOSABS 0.2 03/05/2017 0856   BASOSABS 0.1 03/21/2021 0943   BASOSABS 0.1 03/05/2017 0856    BMET    Component Value Date/Time   NA 136 05/07/2016 0326   NA 141 05/02/2015 0952   K 3.8 05/07/2016 0326   K 4.2 05/02/2015 0952   CL 101 05/07/2016 0326   CL 104 04/23/2012 1017   CO2 23 05/07/2016 0326   CO2 28  05/02/2015 0952   GLUCOSE 149 (H) 05/07/2016 0326   GLUCOSE 103 05/02/2015 0952   GLUCOSE 121 (H) 04/23/2012 1017   BUN 22 (H) 05/07/2016 0326   BUN 13.0 05/02/2015 0952   CREATININE 1.06 (H) 05/07/2016 0326   CREATININE 0.7 05/02/2015 0952   CALCIUM 9.2 05/07/2016 0326   CALCIUM 9.4 05/02/2015 0952   GFRNONAA 51 (L) 05/07/2016 0326   GFRAA 59 (L) 05/07/2016 0326    BNP No results found for: BNP  ProBNP No results found for: PROBNP  Specialty Problems   None   Allergies  Allergen Reactions   Aspirin Swelling and Rash    Throat swells.   Bee Venom Anaphylaxis   Ibuprofen Shortness Of Breath and Swelling    Throat swells.   Percocet [Oxycodone-Acetaminophen] Itching    Immunization History  Administered Date(s) Administered   Influenza Split 06/16/2014   Influenza, High Dose Seasonal PF 06/11/2017    Past Medical History:  Diagnosis Date   Anemia    iron insufion on 05/2016    Atypical chest pain    Atypical CP--stress test, cor angio, and CT chest negative in 2005. Nuclear study normal in 2011    COPD (chronic obstructive pulmonary disease) ([place])    Diabetes ([place])    Diabetes type 2- 1/13- metformin started   Dyslipidemia    Endometriosis    with ovarian radiation in 1966, gyn Dr cousins   GERD (gastroesophageal reflux disease)    H/H   Herpes    herpes of the left eye, Dr Patrice Paradise optho   History of DVT (deep vein thrombosis)    on OCP   History of kidney stones    Hypertension    Mild anemia    WBC mild high, lab 2012/ HEMATOLOGY    Osteoarthritis    Osteoarthritis of the hip, knee, and hand, vicodin , prn   Osteopenia    BD in 2013   Tobacco use    quit 03/2008   Vitamin D deficiency     Tobacco History: Social History   Tobacco Use  Smoking Status Former  Smokeless Tobacco Never   Counseling given: Not Answered   Continue to not smoke  Outpatient Encounter Medications as of 06/14/2021  Medication Sig   albuterol (PROVENTIL  HFA;VENTOLIN HFA) 108 (90 BASE) MCG/ACT inhaler Inhale 2 puffs into the lungs every 4 (four) hours as needed for wheezing or shortness of breath.    BREZTRI AEROSPHERE 160-9-4.8 MCG/ACT AERO Inhale 2 puffs into the lungs 2 (two) times daily.   fluticasone (FLONASE) 50 MCG/ACT nasal spray Place 2 sprays into both nostrils every evening.    hydrochlorothiazide (HYDRODIURIL) 25 MG tablet Take 25 mg by mouth daily.   HYDROcodone-acetaminophen (NORCO) 7.5-325 MG tablet Take 1 tablet by mouth 2 (two) times daily as needed for  moderate pain.    lidocaine (LIDODERM) 5 % Place 1 patch onto the skin daily. Remove & Discard patch within 12 hours or as directed by MD   losartan (COZAAR) 100 MG tablet Take 100 mg by mouth daily.   methocarbamol (ROBAXIN) 500 MG tablet Take 1 tablet (500 mg total) by mouth 2 (two) times daily.   montelukast (SINGULAIR) 10 MG tablet Take 10 mg by mouth every evening.    omeprazole (PRILOSEC) 20 MG capsule Take 20 mg by mouth daily.   simvastatin (ZOCOR) 40 MG tablet Take 40 mg by mouth every evening.   valACYclovir (VALTREX) 500 MG tablet Take 500 mg by mouth 2 (two) times daily.    Vitamin D, Ergocalciferol, (DRISDOL) 50000 UNITS CAPS Take 50,000 Units by mouth every 14 (fourteen) days.   No facility-administered encounter medications on file as of 06/14/2021.     Review of Systems  Review of Systems  N/a Physical Exam  BP 112/68   Pulse 78   Ht 5' 2.5" (1.588 m)   Wt 187 lb 9.6 oz (85.1 kg)   SpO2 95% Comment: on RA  BMI 33.77 kg/m   Wt Readings from Last 5 Encounters:  06/14/21 187 lb 9.6 oz (85.1 kg)  03/22/21 186 lb 6.4 oz (84.6 kg)  03/21/21 185 lb 8 oz (84.1 kg)  09/20/20 192 lb 6.4 oz (87.3 kg)  09/14/20 188 lb (85.3 kg)    BMI Readings from Last 5 Encounters:  06/14/21 33.77 kg/m  03/22/21 33.55 kg/m  03/21/21 32.86 kg/m  09/20/20 34.08 kg/m  09/14/20 33.30 kg/m     Physical Exam General: Sitting in chair, no acute distress Eyes:  EOMI, icterus Neck: Supple, no JVP appreciated, Cardiovascular: Regular rate and rhythm, no murmur Pulmonary: Clear to auscultation, distant, normal work of breathing Abdomen: Single bowel sounds present MSK: No synovitis, no joint effusion Neuro: Normal gait, no weakness Psych: Normal mood, full affect   Assessment & Plan:   Dyspnea on exertion: Likely multifactorial related to COPD, concern for uncontrolled asthma, weight, deconditioning.  COPD: Diagnosed by what she describes spirometry around early 2000's.  Cannot review the results.  Consider repeat PFTs in the future although unlikely to change management at this time.  Asthma: Longstanding diagnosis per patient.  She has uncontrolled dyspnea.  Symptoms worse at night, wheezy at night.  Chronically elevated eosinophils.  Uncontrolled symptoms on Breztri 2 puffs BID, montelukast.  Counselor recommended biologic therapy.  She defers and declines at this time.  We will continue to discuss this with her.  Consider low-dose azithromycin therapy in the future, albeit not a lot of exacerbations.  Nasal congestion: Chronic.  Likely connected with asthma or atopic symptoms.  Likely polyposis given her history of anaphylaxis to NSAIDs.  Recommend sinus rinse twice daily, Flonase twice daily x1 week then daily, Afrin twice daily x3 days.  Return in about 3 months (around 09/14/2021).   Lanier Clam, MD 06/14/2021

## 2021-06-28 DIAGNOSIS — H02055 Trichiasis without entropian left lower eyelid: Secondary | ICD-10-CM | POA: Diagnosis not present

## 2021-06-28 DIAGNOSIS — Z79899 Other long term (current) drug therapy: Secondary | ICD-10-CM | POA: Diagnosis not present

## 2021-06-28 DIAGNOSIS — Z7984 Long term (current) use of oral hypoglycemic drugs: Secondary | ICD-10-CM | POA: Diagnosis not present

## 2021-06-28 DIAGNOSIS — E119 Type 2 diabetes mellitus without complications: Secondary | ICD-10-CM | POA: Diagnosis not present

## 2021-06-28 DIAGNOSIS — H168 Other keratitis: Secondary | ICD-10-CM | POA: Diagnosis not present

## 2021-06-28 DIAGNOSIS — H1820 Unspecified corneal edema: Secondary | ICD-10-CM | POA: Diagnosis not present

## 2021-06-28 DIAGNOSIS — Z961 Presence of intraocular lens: Secondary | ICD-10-CM | POA: Diagnosis not present

## 2021-06-28 DIAGNOSIS — Z7952 Long term (current) use of systemic steroids: Secondary | ICD-10-CM | POA: Diagnosis not present

## 2021-06-28 DIAGNOSIS — H04123 Dry eye syndrome of bilateral lacrimal glands: Secondary | ICD-10-CM | POA: Diagnosis not present

## 2021-06-28 DIAGNOSIS — H02052 Trichiasis without entropian right lower eyelid: Secondary | ICD-10-CM | POA: Diagnosis not present

## 2021-07-16 DIAGNOSIS — Z7984 Long term (current) use of oral hypoglycemic drugs: Secondary | ICD-10-CM | POA: Diagnosis not present

## 2021-07-16 DIAGNOSIS — H02055 Trichiasis without entropian left lower eyelid: Secondary | ICD-10-CM | POA: Diagnosis not present

## 2021-07-16 DIAGNOSIS — H168 Other keratitis: Secondary | ICD-10-CM | POA: Diagnosis not present

## 2021-07-16 DIAGNOSIS — E119 Type 2 diabetes mellitus without complications: Secondary | ICD-10-CM | POA: Diagnosis not present

## 2021-07-16 DIAGNOSIS — H02052 Trichiasis without entropian right lower eyelid: Secondary | ICD-10-CM | POA: Diagnosis not present

## 2021-07-16 DIAGNOSIS — H43392 Other vitreous opacities, left eye: Secondary | ICD-10-CM | POA: Diagnosis not present

## 2021-07-16 DIAGNOSIS — Z961 Presence of intraocular lens: Secondary | ICD-10-CM | POA: Diagnosis not present

## 2021-08-20 DIAGNOSIS — H168 Other keratitis: Secondary | ICD-10-CM | POA: Diagnosis not present

## 2021-08-20 DIAGNOSIS — Z7984 Long term (current) use of oral hypoglycemic drugs: Secondary | ICD-10-CM | POA: Diagnosis not present

## 2021-08-20 DIAGNOSIS — E119 Type 2 diabetes mellitus without complications: Secondary | ICD-10-CM | POA: Diagnosis not present

## 2021-08-20 DIAGNOSIS — H02052 Trichiasis without entropian right lower eyelid: Secondary | ICD-10-CM | POA: Diagnosis not present

## 2021-08-20 DIAGNOSIS — H02055 Trichiasis without entropian left lower eyelid: Secondary | ICD-10-CM | POA: Diagnosis not present

## 2021-08-20 DIAGNOSIS — Z961 Presence of intraocular lens: Secondary | ICD-10-CM | POA: Diagnosis not present

## 2021-08-31 DIAGNOSIS — D72829 Elevated white blood cell count, unspecified: Secondary | ICD-10-CM | POA: Diagnosis not present

## 2021-08-31 DIAGNOSIS — M5090 Cervical disc disorder, unspecified, unspecified cervical region: Secondary | ICD-10-CM | POA: Diagnosis not present

## 2021-08-31 DIAGNOSIS — I10 Essential (primary) hypertension: Secondary | ICD-10-CM | POA: Diagnosis not present

## 2021-08-31 DIAGNOSIS — Z0000 Encounter for general adult medical examination without abnormal findings: Secondary | ICD-10-CM | POA: Diagnosis not present

## 2021-08-31 DIAGNOSIS — N1831 Chronic kidney disease, stage 3a: Secondary | ICD-10-CM | POA: Diagnosis not present

## 2021-08-31 DIAGNOSIS — E782 Mixed hyperlipidemia: Secondary | ICD-10-CM | POA: Diagnosis not present

## 2021-08-31 DIAGNOSIS — Z23 Encounter for immunization: Secondary | ICD-10-CM | POA: Diagnosis not present

## 2021-08-31 DIAGNOSIS — M179 Osteoarthritis of knee, unspecified: Secondary | ICD-10-CM | POA: Diagnosis not present

## 2021-08-31 DIAGNOSIS — I700 Atherosclerosis of aorta: Secondary | ICD-10-CM | POA: Diagnosis not present

## 2021-08-31 DIAGNOSIS — Z1389 Encounter for screening for other disorder: Secondary | ICD-10-CM | POA: Diagnosis not present

## 2021-08-31 DIAGNOSIS — G894 Chronic pain syndrome: Secondary | ICD-10-CM | POA: Diagnosis not present

## 2021-08-31 DIAGNOSIS — D509 Iron deficiency anemia, unspecified: Secondary | ICD-10-CM | POA: Diagnosis not present

## 2021-08-31 DIAGNOSIS — J449 Chronic obstructive pulmonary disease, unspecified: Secondary | ICD-10-CM | POA: Diagnosis not present

## 2021-08-31 DIAGNOSIS — E1122 Type 2 diabetes mellitus with diabetic chronic kidney disease: Secondary | ICD-10-CM | POA: Diagnosis not present

## 2021-09-06 ENCOUNTER — Telehealth: Payer: Self-pay | Admitting: Oncology

## 2021-09-06 NOTE — Telephone Encounter (Signed)
Called pt back per 1/26 inbasket, pt aware of appts

## 2021-09-17 ENCOUNTER — Other Ambulatory Visit: Payer: Self-pay | Admitting: *Deleted

## 2021-09-17 DIAGNOSIS — D500 Iron deficiency anemia secondary to blood loss (chronic): Secondary | ICD-10-CM

## 2021-09-18 ENCOUNTER — Inpatient Hospital Stay: Payer: Medicare Other

## 2021-09-18 ENCOUNTER — Other Ambulatory Visit: Payer: Self-pay

## 2021-09-18 ENCOUNTER — Inpatient Hospital Stay: Payer: Medicare Other | Attending: Oncology | Admitting: Oncology

## 2021-09-18 VITALS — BP 147/87 | HR 86 | Temp 99.0°F | Resp 17 | Ht 62.5 in | Wt 180.9 lb

## 2021-09-18 DIAGNOSIS — D508 Other iron deficiency anemias: Secondary | ICD-10-CM | POA: Insufficient documentation

## 2021-09-18 DIAGNOSIS — K909 Intestinal malabsorption, unspecified: Secondary | ICD-10-CM | POA: Insufficient documentation

## 2021-09-18 DIAGNOSIS — D72829 Elevated white blood cell count, unspecified: Secondary | ICD-10-CM | POA: Insufficient documentation

## 2021-09-18 DIAGNOSIS — J449 Chronic obstructive pulmonary disease, unspecified: Secondary | ICD-10-CM | POA: Diagnosis not present

## 2021-09-18 DIAGNOSIS — D500 Iron deficiency anemia secondary to blood loss (chronic): Secondary | ICD-10-CM | POA: Diagnosis not present

## 2021-09-18 DIAGNOSIS — Z79899 Other long term (current) drug therapy: Secondary | ICD-10-CM | POA: Insufficient documentation

## 2021-09-18 LAB — CBC WITH DIFFERENTIAL (CANCER CENTER ONLY)
Abs Immature Granulocytes: 0.05 10*3/uL (ref 0.00–0.07)
Basophils Absolute: 0 10*3/uL (ref 0.0–0.1)
Basophils Relative: 0 %
Eosinophils Absolute: 0.3 10*3/uL (ref 0.0–0.5)
Eosinophils Relative: 3 %
HCT: 41.8 % (ref 36.0–46.0)
Hemoglobin: 12.6 g/dL (ref 12.0–15.0)
Immature Granulocytes: 0 %
Lymphocytes Relative: 12 %
Lymphs Abs: 1.5 10*3/uL (ref 0.7–4.0)
MCH: 26.8 pg (ref 26.0–34.0)
MCHC: 30.1 g/dL (ref 30.0–36.0)
MCV: 88.7 fL (ref 80.0–100.0)
Monocytes Absolute: 0.7 10*3/uL (ref 0.1–1.0)
Monocytes Relative: 6 %
Neutro Abs: 9.8 10*3/uL — ABNORMAL HIGH (ref 1.7–7.7)
Neutrophils Relative %: 79 %
Platelet Count: 219 10*3/uL (ref 150–400)
RBC: 4.71 MIL/uL (ref 3.87–5.11)
RDW: 15.6 % — ABNORMAL HIGH (ref 11.5–15.5)
WBC Count: 12.4 10*3/uL — ABNORMAL HIGH (ref 4.0–10.5)
nRBC: 0 % (ref 0.0–0.2)

## 2021-09-18 LAB — IRON AND IRON BINDING CAPACITY (CC-WL,HP ONLY)
Iron: 35 ug/dL (ref 28–170)
Saturation Ratios: 11 % (ref 10.4–31.8)
TIBC: 308 ug/dL (ref 250–450)
UIBC: 273 ug/dL (ref 148–442)

## 2021-09-18 LAB — FERRITIN: Ferritin: 106 ng/mL (ref 11–307)

## 2021-09-18 NOTE — Progress Notes (Signed)
Hematology and Oncology Follow Up Visit  Christina Bennett [id number] [birth year] 81 y.o. 09/18/2021 12:37 PM    Principle Diagnosis: 81 year old woman with iron deficiency anemia related to poor absorption diagnosed in 2013.    Prior therapy: IV iron infusion intermittently.  She received Feraheme 1000 mg given in August 2021.    Current therapy:  Oral iron replacement therapy.  Repeat intravenous iron as needed.      Interim History: [name] returns today for repeat evaluation.  Since last visit, she reports no major changes in her health.  She denies any recent hospitalizations or illnesses.  She denies any excessive fatigue, tiredness or weakness.     Medications: Reviewed without changes. Current Outpatient Medications  Medication Sig Dispense Refill   albuterol (PROVENTIL HFA;VENTOLIN HFA) 108 (90 BASE) MCG/ACT inhaler Inhale 2 puffs into the lungs every 4 (four) hours as needed for wheezing or shortness of breath.      BREZTRI AEROSPHERE 160-9-4.8 MCG/ACT AERO Inhale 2 puffs into the lungs 2 (two) times daily.     fluticasone (FLONASE) 50 MCG/ACT nasal spray Place 2 sprays into both nostrils every evening.      hydrochlorothiazide (HYDRODIURIL) 25 MG tablet Take 25 mg by mouth daily.     HYDROcodone-acetaminophen (NORCO) 7.5-325 MG tablet Take 1 tablet by mouth 2 (two) times daily as needed for moderate pain.      lidocaine (LIDODERM) 5 % Place 1 patch onto the skin daily. Remove & Discard patch within 12 hours or as directed by MD 30 patch 0   losartan (COZAAR) 100 MG tablet Take 100 mg by mouth daily.     methocarbamol (ROBAXIN) 500 MG tablet Take 1 tablet (500 mg total) by mouth 2 (two) times daily. 20 tablet 0   montelukast (SINGULAIR) 10 MG tablet Take 10 mg by mouth every evening.   2   omeprazole (PRILOSEC) 20 MG capsule Take 20 mg by mouth daily.     simvastatin (ZOCOR) 40 MG tablet Take 40 mg by mouth every evening.     valACYclovir (VALTREX) 500 MG tablet Take 500  mg by mouth 2 (two) times daily.   0   Vitamin D, Ergocalciferol, (DRISDOL) 50000 UNITS CAPS Take 50,000 Units by mouth every 14 (fourteen) days.     No current facility-administered medications for this visit.    Allergies:  Allergies  Allergen Reactions   Aspirin Swelling and Rash    Throat swells.   Bee Venom Anaphylaxis   Ibuprofen Shortness Of Breath and Swelling    Throat swells.   Percocet [Oxycodone-Acetaminophen] Itching        Physical Exam:   Blood pressure (!) 147/87, pulse 86, temperature 99 F (37.2 C), temperature source Temporal, resp. rate 17, height 5' 2.5" (1.588 m), weight 180 lb 14.4 oz (82.1 kg), SpO2 97 %.     ECOG: 1    General appearance: Comfortable appearing without any discomfort Head: Normocephalic without any trauma Oropharynx: Mucous membranes are moist and pink without any thrush or ulcers. Eyes: Pupils are equal and round reactive to light. Lymph nodes: No cervical, supraclavicular, inguinal or axillary lymphadenopathy.   Heart:regular rate and rhythm.  S1 and S2 without leg edema. Lung: Clear without any rhonchi or wheezes.  No dullness to percussion. Abdomin: Soft, nontender, nondistended with good bowel sounds.  No hepatosplenomegaly. Musculoskeletal: No joint deformity or effusion.  Full range of motion noted. Neurological: No deficits noted on motor, sensory and deep tendon reflex exam. Skin: No  petechial rash or dryness.  Appeared moist.          Lab Results: Lab Results  Component Value Date   WBC 12.0 (H) 03/21/2021   HGB 12.3 03/21/2021   HCT 39.4 03/21/2021   MCV 90.4 03/21/2021   PLT 238 03/21/2021     Chemistry      Component Value Date/Time   NA 136 05/07/2016 0326   NA 141 05/02/2015 0952   K 3.8 05/07/2016 0326   K 4.2 05/02/2015 0952   CL 101 05/07/2016 0326   CL 104 04/23/2012 1017   CO2 23 05/07/2016 0326   CO2 28 05/02/2015 0952   BUN 22 (H) 05/07/2016 0326   BUN 13.0 05/02/2015 0952    CREATININE 1.06 (H) 05/07/2016 0326   CREATININE 0.7 05/02/2015 0952      Component Value Date/Time   CALCIUM 9.2 05/07/2016 0326   CALCIUM 9.4 05/02/2015 0952   ALKPHOS 81 05/07/2016 0326   ALKPHOS 82 05/02/2015 0952   AST 18 05/07/2016 0326   AST 12 05/02/2015 0952   ALT 9 (L) 05/07/2016 0326   ALT 7 05/02/2015 0952   BILITOT 0.8 05/07/2016 0326   BILITOT 0.38 05/02/2015 0952          Latest Reference Range & Units 09/20/20 11:43 03/21/21 09:43  Iron 41 - 142 ug/dL 32 (L) 34 (L)  UIBC 120 - 384 ug/dL 221 249  TIBC 236 - 444 ug/dL 253 283  Saturation Ratios 21 - 57 % 13 (L) 12 (L)  Ferritin 11 - 307 ng/mL 158 95  (L): Data is abnormally low        Impression and Plan:  81 year old woman with:   1.  Anemia diagnosed in 2013.  She is required repeat intravenous iron due to poor iron absorption.  Laboratory data in the last year were reviewed and showed mild decline in her iron although hemoglobin remained within normal range.  Risks and benefits of repeat intravenous iron were discussed at this time.  Complications that include arthralgias, myalgias and infusion related issues were reiterated.  Her hemoglobin is within normal range at this time but iron studies are currently pending.  2. Colon cancer screening: She is up-to-date on her colon cancer screening without any GI symptoms.  3. Leukocytosis: Reactive in nature related to COPD and iron deficiency.  4.  COPD: Respiratory status is stable without any recent exacerbation.   5. Followup: In 6 months for repeat follow-up.  30  minutes were spent on this encounter.  The time was dedicated to reviewing laboratory data, treatment choices, complications related to therapy and future plan of care discussion.   Zola Button 2/7/202312:37 PM

## 2021-10-01 DIAGNOSIS — Z7984 Long term (current) use of oral hypoglycemic drugs: Secondary | ICD-10-CM | POA: Diagnosis not present

## 2021-10-01 DIAGNOSIS — H02056 Trichiasis without entropian left eye, unspecified eyelid: Secondary | ICD-10-CM | POA: Diagnosis not present

## 2021-10-01 DIAGNOSIS — H02052 Trichiasis without entropian right lower eyelid: Secondary | ICD-10-CM | POA: Diagnosis not present

## 2021-10-01 DIAGNOSIS — H43392 Other vitreous opacities, left eye: Secondary | ICD-10-CM | POA: Diagnosis not present

## 2021-10-01 DIAGNOSIS — H02055 Trichiasis without entropian left lower eyelid: Secondary | ICD-10-CM | POA: Diagnosis not present

## 2021-10-01 DIAGNOSIS — E119 Type 2 diabetes mellitus without complications: Secondary | ICD-10-CM | POA: Diagnosis not present

## 2021-10-01 DIAGNOSIS — H02053 Trichiasis without entropian right eye, unspecified eyelid: Secondary | ICD-10-CM | POA: Diagnosis not present

## 2021-10-01 DIAGNOSIS — H168 Other keratitis: Secondary | ICD-10-CM | POA: Diagnosis not present

## 2021-10-01 DIAGNOSIS — Z961 Presence of intraocular lens: Secondary | ICD-10-CM | POA: Diagnosis not present

## 2021-10-20 DIAGNOSIS — Z20822 Contact with and (suspected) exposure to covid-19: Secondary | ICD-10-CM | POA: Diagnosis not present

## 2021-11-07 DIAGNOSIS — Z20828 Contact with and (suspected) exposure to other viral communicable diseases: Secondary | ICD-10-CM | POA: Diagnosis not present

## 2021-11-07 DIAGNOSIS — Z1152 Encounter for screening for COVID-19: Secondary | ICD-10-CM | POA: Diagnosis not present

## 2021-11-29 DIAGNOSIS — Z20822 Contact with and (suspected) exposure to covid-19: Secondary | ICD-10-CM | POA: Diagnosis not present

## 2021-12-08 DIAGNOSIS — Z20822 Contact with and (suspected) exposure to covid-19: Secondary | ICD-10-CM | POA: Diagnosis not present

## 2021-12-14 DIAGNOSIS — Z20822 Contact with and (suspected) exposure to covid-19: Secondary | ICD-10-CM | POA: Diagnosis not present

## 2021-12-15 DIAGNOSIS — Z20822 Contact with and (suspected) exposure to covid-19: Secondary | ICD-10-CM | POA: Diagnosis not present

## 2021-12-17 DIAGNOSIS — Z20822 Contact with and (suspected) exposure to covid-19: Secondary | ICD-10-CM | POA: Diagnosis not present

## 2021-12-18 DIAGNOSIS — Z20828 Contact with and (suspected) exposure to other viral communicable diseases: Secondary | ICD-10-CM | POA: Diagnosis not present

## 2022-01-10 DIAGNOSIS — H168 Other keratitis: Secondary | ICD-10-CM | POA: Diagnosis not present

## 2022-01-10 DIAGNOSIS — E119 Type 2 diabetes mellitus without complications: Secondary | ICD-10-CM | POA: Diagnosis not present

## 2022-01-10 DIAGNOSIS — H43392 Other vitreous opacities, left eye: Secondary | ICD-10-CM | POA: Diagnosis not present

## 2022-01-10 DIAGNOSIS — H02052 Trichiasis without entropian right lower eyelid: Secondary | ICD-10-CM | POA: Diagnosis not present

## 2022-01-10 DIAGNOSIS — Z961 Presence of intraocular lens: Secondary | ICD-10-CM | POA: Diagnosis not present

## 2022-01-10 DIAGNOSIS — H04123 Dry eye syndrome of bilateral lacrimal glands: Secondary | ICD-10-CM | POA: Diagnosis not present

## 2022-01-10 DIAGNOSIS — B0052 Herpesviral keratitis: Secondary | ICD-10-CM | POA: Diagnosis not present

## 2022-01-10 DIAGNOSIS — H02055 Trichiasis without entropian left lower eyelid: Secondary | ICD-10-CM | POA: Diagnosis not present

## 2022-02-28 DIAGNOSIS — G894 Chronic pain syndrome: Secondary | ICD-10-CM | POA: Diagnosis not present

## 2022-02-28 DIAGNOSIS — E782 Mixed hyperlipidemia: Secondary | ICD-10-CM | POA: Diagnosis not present

## 2022-02-28 DIAGNOSIS — N1831 Chronic kidney disease, stage 3a: Secondary | ICD-10-CM | POA: Diagnosis not present

## 2022-02-28 DIAGNOSIS — B0052 Herpesviral keratitis: Secondary | ICD-10-CM | POA: Diagnosis not present

## 2022-02-28 DIAGNOSIS — J449 Chronic obstructive pulmonary disease, unspecified: Secondary | ICD-10-CM | POA: Diagnosis not present

## 2022-02-28 DIAGNOSIS — M8588 Other specified disorders of bone density and structure, other site: Secondary | ICD-10-CM | POA: Diagnosis not present

## 2022-02-28 DIAGNOSIS — I10 Essential (primary) hypertension: Secondary | ICD-10-CM | POA: Diagnosis not present

## 2022-02-28 DIAGNOSIS — I700 Atherosclerosis of aorta: Secondary | ICD-10-CM | POA: Diagnosis not present

## 2022-02-28 DIAGNOSIS — E1122 Type 2 diabetes mellitus with diabetic chronic kidney disease: Secondary | ICD-10-CM | POA: Diagnosis not present

## 2022-03-19 ENCOUNTER — Ambulatory Visit: Payer: Medicare Other | Admitting: Oncology

## 2022-03-19 ENCOUNTER — Other Ambulatory Visit: Payer: Medicare Other

## 2022-03-20 ENCOUNTER — Inpatient Hospital Stay (HOSPITAL_BASED_OUTPATIENT_CLINIC_OR_DEPARTMENT_OTHER): Payer: Medicare Other | Admitting: Oncology

## 2022-03-20 ENCOUNTER — Inpatient Hospital Stay: Payer: Medicare Other | Attending: Oncology

## 2022-03-20 ENCOUNTER — Other Ambulatory Visit: Payer: Self-pay

## 2022-03-20 VITALS — BP 146/70 | HR 83 | Temp 98.3°F | Resp 16 | Ht 62.5 in | Wt 190.5 lb

## 2022-03-20 DIAGNOSIS — Z79899 Other long term (current) drug therapy: Secondary | ICD-10-CM | POA: Diagnosis not present

## 2022-03-20 DIAGNOSIS — D72829 Elevated white blood cell count, unspecified: Secondary | ICD-10-CM | POA: Diagnosis not present

## 2022-03-20 DIAGNOSIS — D508 Other iron deficiency anemias: Secondary | ICD-10-CM | POA: Insufficient documentation

## 2022-03-20 DIAGNOSIS — D500 Iron deficiency anemia secondary to blood loss (chronic): Secondary | ICD-10-CM

## 2022-03-20 DIAGNOSIS — K909 Intestinal malabsorption, unspecified: Secondary | ICD-10-CM | POA: Diagnosis not present

## 2022-03-20 LAB — CBC WITH DIFFERENTIAL (CANCER CENTER ONLY)
Abs Immature Granulocytes: 0.05 10*3/uL (ref 0.00–0.07)
Basophils Absolute: 0 10*3/uL (ref 0.0–0.1)
Basophils Relative: 0 %
Eosinophils Absolute: 0.4 10*3/uL (ref 0.0–0.5)
Eosinophils Relative: 3 %
HCT: 42.3 % (ref 36.0–46.0)
Hemoglobin: 13 g/dL (ref 12.0–15.0)
Immature Granulocytes: 0 %
Lymphocytes Relative: 15 %
Lymphs Abs: 1.9 10*3/uL (ref 0.7–4.0)
MCH: 26.8 pg (ref 26.0–34.0)
MCHC: 30.7 g/dL (ref 30.0–36.0)
MCV: 87.2 fL (ref 80.0–100.0)
Monocytes Absolute: 0.9 10*3/uL (ref 0.1–1.0)
Monocytes Relative: 7 %
Neutro Abs: 9.5 10*3/uL — ABNORMAL HIGH (ref 1.7–7.7)
Neutrophils Relative %: 75 %
Platelet Count: 232 10*3/uL (ref 150–400)
RBC: 4.85 MIL/uL (ref 3.87–5.11)
RDW: 15.6 % — ABNORMAL HIGH (ref 11.5–15.5)
WBC Count: 12.7 10*3/uL — ABNORMAL HIGH (ref 4.0–10.5)
nRBC: 0 % (ref 0.0–0.2)

## 2022-03-20 LAB — IRON AND IRON BINDING CAPACITY (CC-WL,HP ONLY)
Iron: 29 ug/dL (ref 28–170)
Saturation Ratios: 9 % — ABNORMAL LOW (ref 10.4–31.8)
TIBC: 314 ug/dL (ref 250–450)
UIBC: 285 ug/dL

## 2022-03-20 NOTE — Progress Notes (Signed)
Hematology and Oncology Follow Up Visit  Christina Bennett [id number] 22-Jun-1941 81 y.o. 03/20/2022 3:28 PM    Principle Diagnosis: 82 year old woman with anemia diagnosed in 2013 related to iron deficiency anemia related to poor absorption.   Prior therapy: IV iron infusion intermittently.  She received Feraheme 1000 mg given in August 2021.    Current therapy:  Oral iron replacement therapy.       Interim History: Christina Bennett presents today for a follow up.  Since last visit, she reports feeling well.  She reports no bleeding complications. She denies any recent hospitalizations or illnesses.  She denies any excessive fatigue, tiredness or falls. She tolerates oral iron well.      Medications: Updated Current Outpatient Medications  Medication Sig Dispense Refill   albuterol (PROVENTIL HFA;VENTOLIN HFA) 108 (90 BASE) MCG/ACT inhaler Inhale 2 puffs into the lungs every 4 (four) hours as needed for wheezing or shortness of breath.      BREZTRI AEROSPHERE 160-9-4.8 MCG/ACT AERO Inhale 2 puffs into the lungs 2 (two) times daily.     fluticasone (FLONASE) 50 MCG/ACT nasal spray Place 2 sprays into both nostrils every evening.      hydrochlorothiazide (HYDRODIURIL) 25 MG tablet Take 25 mg by mouth daily.     HYDROcodone-acetaminophen (NORCO) 7.5-325 MG tablet Take 1 tablet by mouth 2 (two) times daily as needed for moderate pain.      lidocaine (LIDODERM) 5 % Place 1 patch onto the skin daily. Remove & Discard patch within 12 hours or as directed by MD 30 patch 0   losartan (COZAAR) 100 MG tablet Take 100 mg by mouth daily.     methocarbamol (ROBAXIN) 500 MG tablet Take 1 tablet (500 mg total) by mouth 2 (two) times daily. 20 tablet 0   montelukast (SINGULAIR) 10 MG tablet Take 10 mg by mouth every evening.   2   omeprazole (PRILOSEC) 20 MG capsule Take 20 mg by mouth daily.     simvastatin (ZOCOR) 40 MG tablet Take 40 mg by mouth every evening.     valACYclovir (VALTREX) 500 MG tablet  Take 500 mg by mouth 2 (two) times daily.   0   Vitamin D, Ergocalciferol, (DRISDOL) 50000 UNITS CAPS Take 50,000 Units by mouth every 14 (fourteen) days.     No current facility-administered medications for this visit.    Allergies:  Allergies  Allergen Reactions   Aspirin Swelling and Rash    Throat swells.   Bee Venom Anaphylaxis   Ibuprofen Shortness Of Breath and Swelling    Throat swells.   Percocet [Oxycodone-Acetaminophen] Itching        Physical Exam:   Blood pressure (!) 146/70, pulse 83, temperature 98.3 F (36.8 C), temperature source Temporal, resp. rate 16, height 5' 2.5" (1.588 m), weight 190 lb 8 oz (86.4 kg), SpO2 98 %.     ECOG: 1   General appearance: Alert, awake without any distress. Head: Atraumatic without abnormalities Oropharynx: Without any thrush or ulcers. Eyes: No scleral icterus. Lymph nodes: No lymphadenopathy noted in the cervical, supraclavicular, or axillary nodes Heart:regular rate and rhythm, without any murmurs or gallops.   Lung: Clear to auscultation without any rhonchi, wheezes or dullness to percussion. Abdomin: Soft, nontender without any shifting dullness or ascites. Musculoskeletal: No clubbing or cyanosis. Neurological: No motor or sensory deficits. Skin: No rashes or lesions.          Lab Results: Lab Results  Component Value Date   WBC 12.7 (  H) 03/20/2022   HGB 13.0 03/20/2022   HCT 42.3 03/20/2022   MCV 87.2 03/20/2022   PLT 232 03/20/2022     Chemistry      Component Value Date/Time   NA 136 05/07/2016 0326   NA 141 05/02/2015 0952   K 3.8 05/07/2016 0326   K 4.2 05/02/2015 0952   CL 101 05/07/2016 0326   CL 104 04/23/2012 1017   CO2 23 05/07/2016 0326   CO2 28 05/02/2015 0952   BUN 22 (H) 05/07/2016 0326   BUN 13.0 05/02/2015 0952   CREATININE 1.06 (H) 05/07/2016 0326   CREATININE 0.7 05/02/2015 0952      Component Value Date/Time   CALCIUM 9.2 05/07/2016 0326   CALCIUM 9.4 05/02/2015  0952   ALKPHOS 81 05/07/2016 0326   ALKPHOS 82 05/02/2015 0952   AST 18 05/07/2016 0326   AST 12 05/02/2015 0952   ALT 9 (L) 05/07/2016 0326   ALT 7 05/02/2015 0952   BILITOT 0.8 05/07/2016 0326   BILITOT 0.38 05/02/2015 0952        Latest Reference Range & Units 09/18/21 12:28 09/18/21 12:29  Iron 28 - 170 ug/dL 35   UIBC 148 - 442 ug/dL 273   TIBC 250 - 450 ug/dL 308   Saturation Ratios 10.4 - 31.8 % 11   Ferritin 11 - 307 ng/mL  106           Impression and Plan:  81 year old woman with:   1.  Iron deficiency anemia diagnosed in 2013 related to poor iron absorption.  Iron studies and CBC today reviewed and showed continued normalization of her count. No need for IV iron at this time and continue oral iron only.   2. Colon cancer screening: No further screening is needed unless GI symptoms develop.  3. Leukocytosis: mild and reactive in nature. Labs showed little change at this time.    4. Followup: In 6 months for a lab and MD visit.   20  minutes were dedicated to this visit.  The time was spent on reviewing laboratory data, treatment options, and answering questions about future plan of care.    Zola Button 8/9/20233:28 PM

## 2022-03-21 LAB — FERRITIN: Ferritin: 34 ng/mL (ref 11–307)

## 2022-05-22 DIAGNOSIS — Z23 Encounter for immunization: Secondary | ICD-10-CM | POA: Diagnosis not present

## 2022-07-02 ENCOUNTER — Telehealth: Payer: Self-pay | Admitting: Oncology

## 2022-07-02 NOTE — Telephone Encounter (Signed)
Called patient per Dr. Alen Blew transition, patient scheduled and notified

## 2022-09-02 DIAGNOSIS — D509 Iron deficiency anemia, unspecified: Secondary | ICD-10-CM | POA: Diagnosis not present

## 2022-09-02 DIAGNOSIS — N1831 Chronic kidney disease, stage 3a: Secondary | ICD-10-CM | POA: Diagnosis not present

## 2022-09-02 DIAGNOSIS — M1990 Unspecified osteoarthritis, unspecified site: Secondary | ICD-10-CM | POA: Diagnosis not present

## 2022-09-02 DIAGNOSIS — I700 Atherosclerosis of aorta: Secondary | ICD-10-CM | POA: Diagnosis not present

## 2022-09-02 DIAGNOSIS — E782 Mixed hyperlipidemia: Secondary | ICD-10-CM | POA: Diagnosis not present

## 2022-09-02 DIAGNOSIS — R1310 Dysphagia, unspecified: Secondary | ICD-10-CM | POA: Diagnosis not present

## 2022-09-02 DIAGNOSIS — E559 Vitamin D deficiency, unspecified: Secondary | ICD-10-CM | POA: Diagnosis not present

## 2022-09-02 DIAGNOSIS — Z0000 Encounter for general adult medical examination without abnormal findings: Secondary | ICD-10-CM | POA: Diagnosis not present

## 2022-09-02 DIAGNOSIS — B0052 Herpesviral keratitis: Secondary | ICD-10-CM | POA: Diagnosis not present

## 2022-09-02 DIAGNOSIS — M8588 Other specified disorders of bone density and structure, other site: Secondary | ICD-10-CM | POA: Diagnosis not present

## 2022-09-02 DIAGNOSIS — I10 Essential (primary) hypertension: Secondary | ICD-10-CM | POA: Diagnosis not present

## 2022-09-02 DIAGNOSIS — E1122 Type 2 diabetes mellitus with diabetic chronic kidney disease: Secondary | ICD-10-CM | POA: Diagnosis not present

## 2022-09-02 DIAGNOSIS — G894 Chronic pain syndrome: Secondary | ICD-10-CM | POA: Diagnosis not present

## 2022-09-02 DIAGNOSIS — Z1331 Encounter for screening for depression: Secondary | ICD-10-CM | POA: Diagnosis not present

## 2022-09-02 DIAGNOSIS — J449 Chronic obstructive pulmonary disease, unspecified: Secondary | ICD-10-CM | POA: Diagnosis not present

## 2022-09-12 ENCOUNTER — Encounter: Payer: Self-pay | Admitting: Oncology

## 2022-09-12 DIAGNOSIS — Z885 Allergy status to narcotic agent status: Secondary | ICD-10-CM | POA: Diagnosis not present

## 2022-09-12 DIAGNOSIS — H02054 Trichiasis without entropian left upper eyelid: Secondary | ICD-10-CM | POA: Diagnosis not present

## 2022-09-12 DIAGNOSIS — H02055 Trichiasis without entropian left lower eyelid: Secondary | ICD-10-CM | POA: Diagnosis not present

## 2022-09-12 DIAGNOSIS — Z79899 Other long term (current) drug therapy: Secondary | ICD-10-CM | POA: Diagnosis not present

## 2022-09-12 DIAGNOSIS — H43813 Vitreous degeneration, bilateral: Secondary | ICD-10-CM | POA: Diagnosis not present

## 2022-09-12 DIAGNOSIS — H43392 Other vitreous opacities, left eye: Secondary | ICD-10-CM | POA: Diagnosis not present

## 2022-09-12 DIAGNOSIS — H168 Other keratitis: Secondary | ICD-10-CM | POA: Diagnosis not present

## 2022-09-12 DIAGNOSIS — Z961 Presence of intraocular lens: Secondary | ICD-10-CM | POA: Diagnosis not present

## 2022-09-12 DIAGNOSIS — H04123 Dry eye syndrome of bilateral lacrimal glands: Secondary | ICD-10-CM | POA: Diagnosis not present

## 2022-09-12 DIAGNOSIS — H02052 Trichiasis without entropian right lower eyelid: Secondary | ICD-10-CM | POA: Diagnosis not present

## 2022-09-12 DIAGNOSIS — E119 Type 2 diabetes mellitus without complications: Secondary | ICD-10-CM | POA: Diagnosis not present

## 2022-09-12 DIAGNOSIS — Z8619 Personal history of other infectious and parasitic diseases: Secondary | ICD-10-CM | POA: Diagnosis not present

## 2022-09-12 DIAGNOSIS — Z886 Allergy status to analgesic agent status: Secondary | ICD-10-CM | POA: Diagnosis not present

## 2022-09-19 ENCOUNTER — Inpatient Hospital Stay (HOSPITAL_BASED_OUTPATIENT_CLINIC_OR_DEPARTMENT_OTHER): Payer: Medicare Other | Admitting: Adult Health

## 2022-09-19 ENCOUNTER — Inpatient Hospital Stay: Payer: Medicare Other | Attending: Adult Health

## 2022-09-19 ENCOUNTER — Other Ambulatory Visit: Payer: Self-pay

## 2022-09-19 ENCOUNTER — Encounter: Payer: Self-pay | Admitting: Oncology

## 2022-09-19 ENCOUNTER — Telehealth: Payer: Self-pay | Admitting: Adult Health

## 2022-09-19 VITALS — BP 163/61 | HR 86 | Temp 97.5°F | Resp 18 | Wt 197.5 lb

## 2022-09-19 DIAGNOSIS — Z87891 Personal history of nicotine dependence: Secondary | ICD-10-CM | POA: Insufficient documentation

## 2022-09-19 DIAGNOSIS — Z79899 Other long term (current) drug therapy: Secondary | ICD-10-CM | POA: Insufficient documentation

## 2022-09-19 DIAGNOSIS — J410 Simple chronic bronchitis: Secondary | ICD-10-CM | POA: Diagnosis not present

## 2022-09-19 DIAGNOSIS — D500 Iron deficiency anemia secondary to blood loss (chronic): Secondary | ICD-10-CM

## 2022-09-19 DIAGNOSIS — K909 Intestinal malabsorption, unspecified: Secondary | ICD-10-CM | POA: Insufficient documentation

## 2022-09-19 DIAGNOSIS — J449 Chronic obstructive pulmonary disease, unspecified: Secondary | ICD-10-CM | POA: Insufficient documentation

## 2022-09-19 DIAGNOSIS — D508 Other iron deficiency anemias: Secondary | ICD-10-CM | POA: Diagnosis not present

## 2022-09-19 LAB — CBC WITH DIFFERENTIAL (CANCER CENTER ONLY)
Abs Immature Granulocytes: 0.09 10*3/uL — ABNORMAL HIGH (ref 0.00–0.07)
Basophils Absolute: 0.1 10*3/uL (ref 0.0–0.1)
Basophils Relative: 1 %
Eosinophils Absolute: 0.4 10*3/uL (ref 0.0–0.5)
Eosinophils Relative: 3 %
HCT: 41.6 % (ref 36.0–46.0)
Hemoglobin: 12.7 g/dL (ref 12.0–15.0)
Immature Granulocytes: 1 %
Lymphocytes Relative: 14 %
Lymphs Abs: 1.9 10*3/uL (ref 0.7–4.0)
MCH: 26.8 pg (ref 26.0–34.0)
MCHC: 30.5 g/dL (ref 30.0–36.0)
MCV: 87.9 fL (ref 80.0–100.0)
Monocytes Absolute: 1.1 10*3/uL — ABNORMAL HIGH (ref 0.1–1.0)
Monocytes Relative: 8 %
Neutro Abs: 9.7 10*3/uL — ABNORMAL HIGH (ref 1.7–7.7)
Neutrophils Relative %: 73 %
Platelet Count: 237 10*3/uL (ref 150–400)
RBC: 4.73 MIL/uL (ref 3.87–5.11)
RDW: 16 % — ABNORMAL HIGH (ref 11.5–15.5)
WBC Count: 13.2 10*3/uL — ABNORMAL HIGH (ref 4.0–10.5)
nRBC: 0 % (ref 0.0–0.2)

## 2022-09-19 LAB — FERRITIN: Ferritin: 27 ng/mL (ref 11–307)

## 2022-09-19 NOTE — Telephone Encounter (Signed)
Scheduled appointment per 2/8 los. Patient is aware.

## 2022-09-19 NOTE — Progress Notes (Signed)
[facility] Follow up:    Christina Low, MD [address] [place] [address]   DIAGNOSIS: Iron deficiency anemia  SUMMARY OF HEMATOLOGIC HISTORY: Diagnosed with iron deficiency anemia in 2013 related to poor absorption--colonoscopy in 2013 negative. Upper endoscopy and colonoscopy completed on July 09, 2016 with no evidence of bleeding source Recent iron infusion was with Feraheme x 2 March 08, 2020 and March 15, 2020  CURRENT THERAPY: Intermittent IV iron  INTERVAL HISTORY: Christina Bennett 82 y.o. female returns for follow-up of her history of iron deficiency anemia.  She was previously a patient of Dr. Alen Blew who notes that her diagnosis began in 2013 and was related to poor absorption.  She has received IV iron in the past which she tells me she tolerated well.  Most recently she received this twice in July 2021.  Overall Christina Bennett is doing well.  She has no increasing tiredness shortness of breath or any other concerns.  She denies any blood in her stool, black tarry stool, easy bruising or bleeding, or vaginal bleeding.  Does endorse dysphagia however is known to have an esophageal stricture and is set up to undergo a procedure with stretching in the future.   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  MEDICAL HISTORY: Past Medical History:  Diagnosis Date   Anemia    iron insufion on 05/2016    Atypical chest pain    Atypical CP--stress test, cor angio, and CT chest  negative in 2005. Nuclear study normal in 2011    COPD (chronic obstructive pulmonary disease) ([place])    Diabetes ([place])    Diabetes type 2- 1/13- metformin started   Dyslipidemia    Endometriosis    with ovarian radiation in 1966, gyn Dr cousins   GERD (gastroesophageal reflux disease)    H/H   Herpes    herpes of the left eye, Dr Patrice Paradise optho   History of DVT (deep vein thrombosis)    on OCP   History of kidney stones    Hypertension    Mild anemia    WBC mild high, lab 2012/ HEMATOLOGY    Osteoarthritis    Osteoarthritis of the hip, knee, and hand, vicodin , prn   Osteopenia    BD in 2013   Tobacco use    quit 03/2008   Vitamin D deficiency     SURGICAL HISTORY: Past Surgical History:  Procedure Laterality Date   APPENDECTOMY     BALLOON DILATION N/A 07/09/2016   Procedure: BALLOON DILATION;  Surgeon: Garlan Fair, MD;  Location: [facility];  Service: Endoscopy;  Laterality: N/A;   COLONOSCOPY WITH PROPOFOL N/A 07/09/2016   Procedure: COLONOSCOPY WITH PROPOFOL;  Surgeon: Garlan Fair, MD;  Location: [facility];  Service: Endoscopy;  Laterality: N/A;   ESOPHAGOGASTRODUODENOSCOPY N/A 07/09/2016   Procedure: ESOPHAGOGASTRODUODENOSCOPY (EGD);  Surgeon: Garlan Fair, MD;  Location: [facility];  Service: Endoscopy;  Laterality: N/A;   EYE SURGERY     left eye cataract srugery    HERNIA  REPAIR     surgery fro endometriosis     TONSILLECTOMY      SOCIAL HISTORY: Social History   Socioeconomic History   Marital status: Single    Spouse name: Not on file   Number of children: Not on file   Years of education: Not on file   Highest education level: Not on file  Occupational History   Not on file  Tobacco Use   Smoking status: Former   Smokeless tobacco: Never  Substance and Sexual Activity   Alcohol use: No   Drug use: No   Sexual activity: Not on file  Other Topics Concern   Not on file  Social History Narrative   Not on file   Social  Determinants of Health   Financial Resource Strain: Not on file  Food Insecurity: Not on file  Transportation Needs: Not on file  Physical Activity: Not on file  Stress: Not on file  Social Connections: Not on file  Intimate Partner Violence: Not on file    FAMILY HISTORY: Family History  Problem Relation Age of Onset   Hypertension Mother    Heart disease Brother    Heart disease Brother     Review of Systems  Constitutional:  Negative for appetite change, chills, fatigue, fever and unexpected weight change.  HENT:   Negative for hearing loss, lump/mass and trouble swallowing.   Eyes:  Negative for eye problems and icterus.  Respiratory:  Negative for chest tightness, cough and shortness of breath.   Cardiovascular:  Negative for chest pain, leg swelling and palpitations.  Gastrointestinal:  Negative for abdominal distention, abdominal pain, constipation, diarrhea, nausea and vomiting.  Endocrine: Negative for hot flashes.  Genitourinary:  Negative for difficulty urinating.   Musculoskeletal:  Negative for arthralgias.  Skin:  Negative for itching and rash.  Neurological:  Negative for dizziness, extremity weakness, headaches and numbness.  Hematological:  Negative for adenopathy. Does not bruise/bleed easily.  Psychiatric/Behavioral:  Negative for depression. The patient is not nervous/anxious.       PHYSICAL EXAMINATION  ECOG PERFORMANCE STATUS: 1 - Symptomatic but completely ambulatory  Vitals:   09/19/22 1135  BP: (!) 163/61  Pulse: 86  Resp: 18  Temp: (!) 97.5 F (36.4 C)  SpO2: 95%    Physical Exam Constitutional:      General: She is not in acute distress.    Appearance: Normal appearance. She is not toxic-appearing.  HENT:     Head: Normocephalic and atraumatic.  Eyes:     General: No scleral icterus. Cardiovascular:     Rate and Rhythm: Normal rate and regular rhythm.     Pulses: Normal pulses.     Heart sounds: Normal heart sounds.  Pulmonary:      Effort: Pulmonary effort is normal.     Breath sounds: Normal breath sounds.  Abdominal:     General: Abdomen is flat. Bowel sounds are normal. There is no distension.     Palpations: Abdomen is soft.     Tenderness: There is no abdominal tenderness.  Musculoskeletal:        General: No swelling.     Cervical back: Neck supple.  Lymphadenopathy:     Cervical: No cervical adenopathy.  Skin:    General: Skin is warm and dry.     Findings: No rash.  Neurological:     General: No focal deficit present.     Mental Status: She is alert.  Psychiatric:  Mood and Affect: Mood normal.        Behavior: Behavior normal.     LABORATORY DATA:  CBC    Component Value Date/Time   WBC 13.2 (H) 09/19/2022 1040   WBC 12.8 (H) 09/05/2017 0952   RBC 4.73 09/19/2022 1040   HGB 12.7 09/19/2022 1040   HGB 11.1 (L) 03/05/2017 0856   HCT 41.6 09/19/2022 1040   HCT 35.4 03/05/2017 0856   PLT 237 09/19/2022 1040   PLT 196 03/05/2017 0856   MCV 87.9 09/19/2022 1040   MCV 86.7 03/05/2017 0856   MCH 26.8 09/19/2022 1040   MCHC 30.5 09/19/2022 1040   RDW 16.0 (H) 09/19/2022 1040   RDW 18.2 (H) 03/05/2017 0856   LYMPHSABS 1.9 09/19/2022 1040   LYMPHSABS 1.5 03/05/2017 0856   MONOABS 1.1 (H) 09/19/2022 1040   MONOABS 1.0 (H) 03/05/2017 0856   EOSABS 0.4 09/19/2022 1040   EOSABS 0.2 03/05/2017 0856   BASOSABS 0.1 09/19/2022 1040   BASOSABS 0.1 03/05/2017 0856     ASSESSMENT and THERAPY PLAN:   Iron deficiency anemia Christina Bennett is a 82 year old woman here today for labs and follow-up of her iron deficiency anemia dating back to 2013.  She is doing and feeling well today.  Her hemoglobin is normal today and she has no microcytosis on her CBC.  We will await her iron studies and we will call her if she needs IV iron however it appears unlikely.  We discussed her follow-up recommendations and she does not want to come every 6 months because she has not needed iron since 2021.  I think  this is reasonable and we will see her back in 1 year for labs and follow-up.  Gave her information in her after visit summary about iron deficiency anemia, her follow-up recommendations and information on how to get in touch with me as her new clinician at the cancer center.   All questions were answered. The patient knows to call the clinic with any problems, questions or concerns. We can certainly see the patient much sooner if necessary.  Total encounter time:20 minutes*in face-to-face visit time, chart review, lab review, care coordination, order entry, and documentation of the encounter time.    Wilber Bihari, NP 09/19/22 12:25 PM Medical Oncology and Hematology [facility] [place], [place] [address] Tel. [phone]    Fax. [phone]  *Total Encounter Time as defined by the Centers for Medicare and Medicaid Services includes, in addition to the face-to-face time of a patient visit (documented in the note above) non-face-to-face time: obtaining and reviewing outside history, ordering and reviewing medications, tests or procedures, care coordination (communications with other health care professionals or caregivers) and documentation in the medical record.

## 2022-09-19 NOTE — Assessment & Plan Note (Signed)
Christina Bennett is a 81 year old woman here today for labs and follow-up of her iron deficiency anemia dating back to 2013.  She is doing and feeling well today.  Her hemoglobin is normal today and she has no microcytosis on her CBC.  We will await her iron studies and we will call her if she needs IV iron however it appears unlikely.  We discussed her follow-up recommendations and she does not want to come every 6 months because she has not needed iron since 2021.  I think this is reasonable and we will see her back in 1 year for labs and follow-up.  Gave her information in her after visit summary about iron deficiency anemia, her follow-up recommendations and information on how to get in touch with me as her new clinician at the cancer center.

## 2022-09-19 NOTE — Patient Instructions (Signed)
Iron Deficiency Anemia, Adult  Iron deficiency anemia is when you do not have enough red blood cells or hemoglobin in your blood. This happens because you have too little iron in your body. Hemoglobin carries oxygen to parts of the body. Anemia can cause your body to not get enough oxygen. What are the causes? Not eating enough foods that have iron in them. The body not being able to take in iron well. Blood loss. What increases the risk? Having menstrual periods. Being pregnant. What are the signs or symptoms? Pale skin, lips, and nails. Weakness, dizziness, and getting tired easily. Feeling like you cannot breathe well when moving (shortness of breath). Cold hands and feet. Mild anemia may not cause any symptoms. How is this treated? This condition is treated by finding out why you do not have enough iron and then getting more iron. It may include: Adding foods to your diet that have a lot of iron. Taking iron pills (supplements). If you are pregnant or breastfeeding, you may need to take extra iron. Your diet often does not provide the amount of iron that you need. Getting more vitamin C in your diet. Vitamin C helps your body take in iron. You may need to take iron pills with a glass of orange juice or vitamin C pills. Medicines to make heavy menstrual periods lighter. Surgery or testing procedures to find what is causing the condition. You may need blood tests to see if treatment is working. If the treatment does not seem to be working, you may need more tests. Follow these instructions at home: Medicines Take over-the-counter and prescription medicines only as told by your doctor. This includes iron pills and vitamins. Taking them as told is important because too much iron can be harmful. Take iron pills when your stomach is empty. If you cannot handle this, take them with food. Do not drink milk or take antacids at the same time as your iron pills. Iron pills may turn your poop  (stool)black. If you cannot handle taking iron pills by mouth, ask your doctor about getting iron through: An IV tube. A shot (injection) into a muscle. Eating and drinking Talk with your doctor before changing the foods you eat. Your doctor may tell you to eat foods that have a lot of iron, such as: Liver. Low-fat (lean) beef. Breads and cereals that have iron added to them. Eggs. Dried fruit. Dark green, leafy vegetables. Eat fresh fruits and vegetables that are high in vitamin C. They help your body use iron. Foods with a lot of vitamin C include: Oranges. Peppers. Tomatoes. Mangoes. Managing constipation If you are taking iron pills, they may cause trouble pooping (constipation). To prevent or treat this, you may need to: Drink enough fluid to keep your pee (urine) pale yellow. Take over-the-counter or prescription medicines. Eat foods that are high in fiber. These include beans, whole grains, and fresh fruits and vegetables. Limit foods that are high in fat and sugar. These include fried or sweet foods. General instructions Return to your normal activities when your doctor says that it is safe. Keep all follow-up visits. Contact a doctor if: You feel like you may vomit (nauseous), or you vomit. You feel weak. You get light-headed when getting up from sitting or lying down. You are sweating for no reason. You have trouble pooping. You have worse breathing with physical activity. You have heaviness in your chest. Get help right away if: You faint. If this happens, do not drive yourself  to the hospital. You have a fast heartbeat, or a heartbeat that does not feel regular. Summary Iron deficiency anemia happens when you have too little iron in your body. This condition is treated by finding out why you do not have enough iron in your body and then getting more iron. Take over-the-counter and prescription medicines only as told by your doctor. Eat fresh fruits and vegetables  that are high in vitamin C. Contact a doctor if you have trouble pooping or feel weak. This information is not intended to replace advice given to you by your health care provider. Make sure you discuss any questions you have with your health care provider. Document Revised: 09/06/2021 Document Reviewed: 09/06/2021 Elsevier Patient Education  [place].

## 2022-09-20 ENCOUNTER — Other Ambulatory Visit: Payer: Medicare Other

## 2022-09-20 ENCOUNTER — Ambulatory Visit: Payer: Medicare Other | Admitting: Oncology

## 2022-10-21 DIAGNOSIS — Z86010 Personal history of colonic polyps: Secondary | ICD-10-CM | POA: Diagnosis not present

## 2022-10-21 DIAGNOSIS — K219 Gastro-esophageal reflux disease without esophagitis: Secondary | ICD-10-CM | POA: Diagnosis not present

## 2022-10-21 DIAGNOSIS — R1310 Dysphagia, unspecified: Secondary | ICD-10-CM | POA: Diagnosis not present

## 2022-10-21 DIAGNOSIS — J449 Chronic obstructive pulmonary disease, unspecified: Secondary | ICD-10-CM | POA: Diagnosis not present

## 2022-12-23 DIAGNOSIS — K219 Gastro-esophageal reflux disease without esophagitis: Secondary | ICD-10-CM | POA: Diagnosis not present

## 2022-12-23 DIAGNOSIS — R1312 Dysphagia, oropharyngeal phase: Secondary | ICD-10-CM | POA: Diagnosis not present

## 2022-12-23 DIAGNOSIS — Z86010 Personal history of colonic polyps: Secondary | ICD-10-CM | POA: Diagnosis not present

## 2023-03-05 DIAGNOSIS — I10 Essential (primary) hypertension: Secondary | ICD-10-CM | POA: Diagnosis not present

## 2023-03-05 DIAGNOSIS — J309 Allergic rhinitis, unspecified: Secondary | ICD-10-CM | POA: Diagnosis not present

## 2023-03-05 DIAGNOSIS — E1122 Type 2 diabetes mellitus with diabetic chronic kidney disease: Secondary | ICD-10-CM | POA: Diagnosis not present

## 2023-03-05 DIAGNOSIS — M1990 Unspecified osteoarthritis, unspecified site: Secondary | ICD-10-CM | POA: Diagnosis not present

## 2023-03-05 DIAGNOSIS — N1831 Chronic kidney disease, stage 3a: Secondary | ICD-10-CM | POA: Diagnosis not present

## 2023-03-05 DIAGNOSIS — E782 Mixed hyperlipidemia: Secondary | ICD-10-CM | POA: Diagnosis not present

## 2023-03-05 DIAGNOSIS — J449 Chronic obstructive pulmonary disease, unspecified: Secondary | ICD-10-CM | POA: Diagnosis not present

## 2023-03-05 DIAGNOSIS — G894 Chronic pain syndrome: Secondary | ICD-10-CM | POA: Diagnosis not present

## 2023-03-05 DIAGNOSIS — K219 Gastro-esophageal reflux disease without esophagitis: Secondary | ICD-10-CM | POA: Diagnosis not present

## 2023-03-05 DIAGNOSIS — I700 Atherosclerosis of aorta: Secondary | ICD-10-CM | POA: Diagnosis not present

## 2023-03-05 DIAGNOSIS — M8588 Other specified disorders of bone density and structure, other site: Secondary | ICD-10-CM | POA: Diagnosis not present

## 2023-03-18 DIAGNOSIS — H02052 Trichiasis without entropian right lower eyelid: Secondary | ICD-10-CM | POA: Diagnosis not present

## 2023-03-18 DIAGNOSIS — E119 Type 2 diabetes mellitus without complications: Secondary | ICD-10-CM | POA: Diagnosis not present

## 2023-03-18 DIAGNOSIS — Z7984 Long term (current) use of oral hypoglycemic drugs: Secondary | ICD-10-CM | POA: Diagnosis not present

## 2023-03-18 DIAGNOSIS — H02053 Trichiasis without entropian right eye, unspecified eyelid: Secondary | ICD-10-CM | POA: Diagnosis not present

## 2023-03-18 DIAGNOSIS — H02055 Trichiasis without entropian left lower eyelid: Secondary | ICD-10-CM | POA: Diagnosis not present

## 2023-03-18 DIAGNOSIS — H168 Other keratitis: Secondary | ICD-10-CM | POA: Diagnosis not present

## 2023-03-18 DIAGNOSIS — Z961 Presence of intraocular lens: Secondary | ICD-10-CM | POA: Diagnosis not present

## 2023-03-18 DIAGNOSIS — H26492 Other secondary cataract, left eye: Secondary | ICD-10-CM | POA: Diagnosis not present

## 2023-03-18 DIAGNOSIS — H02056 Trichiasis without entropian left eye, unspecified eyelid: Secondary | ICD-10-CM | POA: Diagnosis not present

## 2023-05-28 DIAGNOSIS — Z23 Encounter for immunization: Secondary | ICD-10-CM | POA: Diagnosis not present

## 2023-06-27 DIAGNOSIS — J441 Chronic obstructive pulmonary disease with (acute) exacerbation: Secondary | ICD-10-CM | POA: Diagnosis not present

## 2023-09-22 ENCOUNTER — Other Ambulatory Visit: Payer: BLUE CROSS/BLUE SHIELD

## 2023-09-22 ENCOUNTER — Telehealth: Payer: Self-pay

## 2023-09-22 ENCOUNTER — Ambulatory Visit: Payer: BLUE CROSS/BLUE SHIELD | Admitting: Adult Health

## 2023-09-22 NOTE — Telephone Encounter (Signed)
 Called pt to inquire about [place]/[facility] for her visit with Alwin Baars, NP. Pt states she does not recall who Loris Ros is/ Explained to pt that she saw Loris Ros last year in Feb 2024 after Dr Dirk Fredericks left our practice. She states she does not recall, and that no one called her to let her know about this appt, and states 'Dr Lorena Rolling is taking care of me. I am not interested. Goodbye." And patient ended call.

## 2023-09-24 NOTE — Telephone Encounter (Signed)
Marland Kitchen

## 2024-02-02 ENCOUNTER — Emergency Department (HOSPITAL_COMMUNITY)

## 2024-02-02 ENCOUNTER — Inpatient Hospital Stay (HOSPITAL_COMMUNITY)
Admission: EM | Admit: 2024-02-02 | Discharge: 2024-02-25 | DRG: 291 | Disposition: A | Discharge status: Skilled Nursing Facility | Attending: Internal Medicine | Admitting: Internal Medicine

## 2024-02-02 DIAGNOSIS — Z882 Allergy status to sulfonamides status: Secondary | ICD-10-CM

## 2024-02-02 DIAGNOSIS — R531 Weakness: Secondary | ICD-10-CM | POA: Diagnosis not present

## 2024-02-02 DIAGNOSIS — E1165 Type 2 diabetes mellitus with hyperglycemia: Secondary | ICD-10-CM | POA: Diagnosis present

## 2024-02-02 DIAGNOSIS — Z91030 Bee allergy status: Secondary | ICD-10-CM

## 2024-02-02 DIAGNOSIS — H538 Other visual disturbances: Secondary | ICD-10-CM | POA: Diagnosis not present

## 2024-02-02 DIAGNOSIS — I69391 Dysphagia following cerebral infarction: Secondary | ICD-10-CM | POA: Diagnosis not present

## 2024-02-02 DIAGNOSIS — N1831 Chronic kidney disease, stage 3a: Secondary | ICD-10-CM | POA: Diagnosis not present

## 2024-02-02 DIAGNOSIS — N179 Acute kidney failure, unspecified: Secondary | ICD-10-CM | POA: Diagnosis not present

## 2024-02-02 DIAGNOSIS — I639 Cerebral infarction, unspecified: Secondary | ICD-10-CM | POA: Diagnosis present

## 2024-02-02 DIAGNOSIS — I10 Essential (primary) hypertension: Secondary | ICD-10-CM | POA: Diagnosis present

## 2024-02-02 DIAGNOSIS — Z7189 Other specified counseling: Secondary | ICD-10-CM | POA: Diagnosis not present

## 2024-02-02 DIAGNOSIS — R29702 NIHSS score 2: Secondary | ICD-10-CM | POA: Diagnosis present

## 2024-02-02 DIAGNOSIS — R471 Dysarthria and anarthria: Secondary | ICD-10-CM | POA: Diagnosis present

## 2024-02-02 DIAGNOSIS — I6389 Other cerebral infarction: Secondary | ICD-10-CM | POA: Diagnosis not present

## 2024-02-02 DIAGNOSIS — Z7902 Long term (current) use of antithrombotics/antiplatelets: Secondary | ICD-10-CM

## 2024-02-02 DIAGNOSIS — E440 Moderate protein-calorie malnutrition: Secondary | ICD-10-CM | POA: Diagnosis present

## 2024-02-02 DIAGNOSIS — Z515 Encounter for palliative care: Secondary | ICD-10-CM | POA: Diagnosis not present

## 2024-02-02 DIAGNOSIS — R739 Hyperglycemia, unspecified: Secondary | ICD-10-CM | POA: Diagnosis not present

## 2024-02-02 DIAGNOSIS — J9601 Acute respiratory failure with hypoxia: Secondary | ICD-10-CM | POA: Diagnosis not present

## 2024-02-02 DIAGNOSIS — G8194 Hemiplegia, unspecified affecting left nondominant side: Secondary | ICD-10-CM | POA: Diagnosis not present

## 2024-02-02 DIAGNOSIS — I130 Hypertensive heart and chronic kidney disease with heart failure and stage 1 through stage 4 chronic kidney disease, or unspecified chronic kidney disease: Secondary | ICD-10-CM | POA: Diagnosis not present

## 2024-02-02 DIAGNOSIS — Z923 Personal history of irradiation: Secondary | ICD-10-CM

## 2024-02-02 DIAGNOSIS — E66811 Obesity, class 1: Secondary | ICD-10-CM | POA: Diagnosis not present

## 2024-02-02 DIAGNOSIS — R079 Chest pain, unspecified: Secondary | ICD-10-CM | POA: Diagnosis not present

## 2024-02-02 DIAGNOSIS — Z86718 Personal history of other venous thrombosis and embolism: Secondary | ICD-10-CM

## 2024-02-02 DIAGNOSIS — N183 Chronic kidney disease, stage 3 unspecified: Secondary | ICD-10-CM | POA: Diagnosis not present

## 2024-02-02 DIAGNOSIS — R4182 Altered mental status, unspecified: Secondary | ICD-10-CM

## 2024-02-02 DIAGNOSIS — E870 Hyperosmolality and hypernatremia: Secondary | ICD-10-CM | POA: Diagnosis not present

## 2024-02-02 DIAGNOSIS — Z8679 Personal history of other diseases of the circulatory system: Secondary | ICD-10-CM | POA: Diagnosis not present

## 2024-02-02 DIAGNOSIS — R0789 Other chest pain: Secondary | ICD-10-CM | POA: Diagnosis not present

## 2024-02-02 DIAGNOSIS — Z79899 Other long term (current) drug therapy: Secondary | ICD-10-CM

## 2024-02-02 DIAGNOSIS — I6381 Other cerebral infarction due to occlusion or stenosis of small artery: Secondary | ICD-10-CM | POA: Diagnosis not present

## 2024-02-02 DIAGNOSIS — E119 Type 2 diabetes mellitus without complications: Secondary | ICD-10-CM

## 2024-02-02 DIAGNOSIS — R29704 NIHSS score 4: Secondary | ICD-10-CM | POA: Diagnosis not present

## 2024-02-02 DIAGNOSIS — I6329 Cerebral infarction due to unspecified occlusion or stenosis of other precerebral arteries: Secondary | ICD-10-CM | POA: Diagnosis not present

## 2024-02-02 DIAGNOSIS — Z7984 Long term (current) use of oral hypoglycemic drugs: Secondary | ICD-10-CM

## 2024-02-02 DIAGNOSIS — R1310 Dysphagia, unspecified: Secondary | ICD-10-CM

## 2024-02-02 DIAGNOSIS — F40240 Claustrophobia: Secondary | ICD-10-CM | POA: Diagnosis present

## 2024-02-02 DIAGNOSIS — J449 Chronic obstructive pulmonary disease, unspecified: Secondary | ICD-10-CM | POA: Diagnosis present

## 2024-02-02 DIAGNOSIS — N3000 Acute cystitis without hematuria: Secondary | ICD-10-CM | POA: Diagnosis not present

## 2024-02-02 DIAGNOSIS — E66812 Obesity, class 2: Secondary | ICD-10-CM | POA: Diagnosis present

## 2024-02-02 DIAGNOSIS — I2510 Atherosclerotic heart disease of native coronary artery without angina pectoris: Secondary | ICD-10-CM | POA: Diagnosis present

## 2024-02-02 DIAGNOSIS — J189 Pneumonia, unspecified organism: Secondary | ICD-10-CM

## 2024-02-02 DIAGNOSIS — Z66 Do not resuscitate: Secondary | ICD-10-CM | POA: Diagnosis not present

## 2024-02-02 DIAGNOSIS — R0602 Shortness of breath: Secondary | ICD-10-CM | POA: Diagnosis not present

## 2024-02-02 DIAGNOSIS — D500 Iron deficiency anemia secondary to blood loss (chronic): Secondary | ICD-10-CM | POA: Diagnosis not present

## 2024-02-02 DIAGNOSIS — D509 Iron deficiency anemia, unspecified: Secondary | ICD-10-CM | POA: Diagnosis not present

## 2024-02-02 DIAGNOSIS — G4489 Other headache syndrome: Secondary | ICD-10-CM | POA: Diagnosis not present

## 2024-02-02 DIAGNOSIS — G9341 Metabolic encephalopathy: Secondary | ICD-10-CM | POA: Diagnosis not present

## 2024-02-02 DIAGNOSIS — E1122 Type 2 diabetes mellitus with diabetic chronic kidney disease: Secondary | ICD-10-CM | POA: Diagnosis present

## 2024-02-02 DIAGNOSIS — E785 Hyperlipidemia, unspecified: Secondary | ICD-10-CM | POA: Diagnosis present

## 2024-02-02 DIAGNOSIS — Z885 Allergy status to narcotic agent status: Secondary | ICD-10-CM

## 2024-02-02 DIAGNOSIS — R29709 NIHSS score 9: Secondary | ICD-10-CM | POA: Diagnosis not present

## 2024-02-02 DIAGNOSIS — E1169 Type 2 diabetes mellitus with other specified complication: Secondary | ICD-10-CM | POA: Diagnosis present

## 2024-02-02 DIAGNOSIS — Z87442 Personal history of urinary calculi: Secondary | ICD-10-CM

## 2024-02-02 DIAGNOSIS — Z886 Allergy status to analgesic agent status: Secondary | ICD-10-CM

## 2024-02-02 DIAGNOSIS — R1312 Dysphagia, oropharyngeal phase: Secondary | ICD-10-CM | POA: Diagnosis present

## 2024-02-02 DIAGNOSIS — R918 Other nonspecific abnormal finding of lung field: Secondary | ICD-10-CM | POA: Diagnosis not present

## 2024-02-02 DIAGNOSIS — I5033 Acute on chronic diastolic (congestive) heart failure: Secondary | ICD-10-CM | POA: Diagnosis not present

## 2024-02-02 DIAGNOSIS — Z6836 Body mass index (BMI) 36.0-36.9, adult: Secondary | ICD-10-CM

## 2024-02-02 DIAGNOSIS — M8580 Other specified disorders of bone density and structure, unspecified site: Secondary | ICD-10-CM | POA: Diagnosis present

## 2024-02-02 DIAGNOSIS — Z9049 Acquired absence of other specified parts of digestive tract: Secondary | ICD-10-CM

## 2024-02-02 DIAGNOSIS — R0989 Other specified symptoms and signs involving the circulatory and respiratory systems: Secondary | ICD-10-CM | POA: Diagnosis not present

## 2024-02-02 DIAGNOSIS — I129 Hypertensive chronic kidney disease with stage 1 through stage 4 chronic kidney disease, or unspecified chronic kidney disease: Secondary | ICD-10-CM | POA: Diagnosis not present

## 2024-02-02 DIAGNOSIS — R9082 White matter disease, unspecified: Secondary | ICD-10-CM | POA: Diagnosis not present

## 2024-02-02 DIAGNOSIS — J410 Simple chronic bronchitis: Secondary | ICD-10-CM | POA: Diagnosis not present

## 2024-02-02 DIAGNOSIS — Z87891 Personal history of nicotine dependence: Secondary | ICD-10-CM

## 2024-02-02 DIAGNOSIS — D649 Anemia, unspecified: Secondary | ICD-10-CM | POA: Diagnosis not present

## 2024-02-02 DIAGNOSIS — Z8249 Family history of ischemic heart disease and other diseases of the circulatory system: Secondary | ICD-10-CM

## 2024-02-02 DIAGNOSIS — K5900 Constipation, unspecified: Secondary | ICD-10-CM | POA: Diagnosis not present

## 2024-02-02 DIAGNOSIS — E1151 Type 2 diabetes mellitus with diabetic peripheral angiopathy without gangrene: Secondary | ICD-10-CM | POA: Diagnosis not present

## 2024-02-02 LAB — BASIC METABOLIC PANEL WITH GFR
Anion gap: 15 (ref 5–15)
BUN: 17 mg/dL (ref 8–23)
CO2: 22 mmol/L (ref 22–32)
Calcium: 9.1 mg/dL (ref 8.9–10.3)
Chloride: 101 mmol/L (ref 98–111)
Creatinine, Ser: 1.05 mg/dL — ABNORMAL HIGH (ref 0.44–1.00)
GFR, Estimated: 53 mL/min — ABNORMAL LOW (ref >60)
Glucose, Bld: 202 mg/dL — ABNORMAL HIGH (ref 70–99)
Potassium: 4.8 mmol/L (ref 3.5–5.1)
Sodium: 138 mmol/L (ref 135–145)

## 2024-02-02 LAB — CBC
HCT: 43.5 % (ref 36.0–46.0)
Hemoglobin: 12.9 g/dL (ref 12.0–15.0)
MCH: 25.4 pg — ABNORMAL LOW (ref 26.0–34.0)
MCHC: 29.7 g/dL — ABNORMAL LOW (ref 30.0–36.0)
MCV: 85.8 fL (ref 80.0–100.0)
Platelets: 289 10*3/uL (ref 150–400)
RBC: 5.07 MIL/uL (ref 3.87–5.11)
RDW: 16.8 % — ABNORMAL HIGH (ref 11.5–15.5)
WBC: 17.7 10*3/uL — ABNORMAL HIGH (ref 4.0–10.5)
nRBC: 0 % (ref 0.0–0.2)

## 2024-02-02 LAB — URINALYSIS, W/ REFLEX TO CULTURE (INFECTION SUSPECTED)
Bilirubin Urine: NEGATIVE
Glucose, UA: >=500 mg/dL — AB
Hgb urine dipstick: NEGATIVE
Ketones, ur: NEGATIVE mg/dL
Nitrite: NEGATIVE
Protein, ur: NEGATIVE mg/dL
Specific Gravity, Urine: 1.028 (ref 1.005–1.030)
pH: 5 (ref 5.0–8.0)

## 2024-02-02 LAB — TROPONIN I (HIGH SENSITIVITY)
Troponin I (High Sensitivity): 7 ng/L (ref <18)
Troponin I (High Sensitivity): 8 ng/L (ref <18)

## 2024-02-02 MED ORDER — ACETAMINOPHEN 500 MG PO TABS
1000.0000 mg | ORAL_TABLET | Freq: Once | ORAL | Status: AC
  Administered 2024-02-02: 1000 mg via ORAL
  Filled 2024-02-02: qty 2

## 2024-02-02 MED ORDER — DEXAMETHASONE SODIUM PHOSPHATE 10 MG/ML IJ SOLN
10.0000 mg | Freq: Once | INTRAMUSCULAR | Status: AC
  Administered 2024-02-02: 10 mg via INTRAVENOUS
  Filled 2024-02-02: qty 1

## 2024-02-02 MED ORDER — PROCHLORPERAZINE EDISYLATE 10 MG/2ML IJ SOLN
10.0000 mg | Freq: Once | INTRAMUSCULAR | Status: AC
  Administered 2024-02-02: 10 mg via INTRAVENOUS
  Filled 2024-02-02: qty 2

## 2024-02-02 MED ORDER — LACTATED RINGERS IV BOLUS
500.0000 mL | Freq: Once | INTRAVENOUS | Status: AC
  Administered 2024-02-03: 500 mL via INTRAVENOUS

## 2024-02-02 MED ORDER — LORAZEPAM 1 MG PO TABS
2.0000 mg | ORAL_TABLET | Freq: Once | ORAL | Status: AC
  Administered 2024-02-02: 2 mg via ORAL
  Filled 2024-02-02: qty 2

## 2024-02-02 MED ORDER — SODIUM CHLORIDE 0.9 % IV SOLN
1.0000 g | Freq: Once | INTRAVENOUS | Status: AC
  Administered 2024-02-02: 1 g via INTRAVENOUS
  Filled 2024-02-02: qty 10

## 2024-02-02 MED ORDER — LORAZEPAM 2 MG/ML IJ SOLN
2.0000 mg | Freq: Once | INTRAMUSCULAR | Status: AC
  Administered 2024-02-03: 2 mg via INTRAVENOUS
  Filled 2024-02-02: qty 1

## 2024-02-02 NOTE — ED Triage Notes (Addendum)
 BIB EMS from home. PT complains of headache. Decreased appetite hasn't had anything to eat or drink for 3 days. PT states she is swimmy headed and blurred vision. Pt states it happened earlier this morning not sure what time. Pt hasn't felt well for a few days.

## 2024-02-02 NOTE — ED Notes (Signed)
 Patient transported to MRI

## 2024-02-02 NOTE — ED Provider Triage Note (Signed)
 Emergency Medicine Provider Triage Evaluation Note  Christina Bennett , a 83 y.o. female  was evaluated in triage.  Pt complains of headache.  States she is confused and not feeling well.  She is a very poor historian.  States her girlfriend at home called 911 for her but she is unsure why.  States she has been feeling weak and had some difficulty walking the last few days as well.  She is tearful and anxious appearing  Review of Systems  Positive: Headache, paresthesias, weakness Negative: Shortness of breath, back pain, abdominal pain, vomiting  Physical Exam  BP 122/63 (BP Location: Right Arm)   Pulse 80   Temp (!) 97.5 F (36.4 C) (Oral)   Resp 19   SpO2 96%  Gen:   Awake, tearful Resp:  Normal effort  MSK:   Moves extremities without difficulty, 5 out of 5 muscle strength throughout, gross sensation intact bilaterally    Medical Decision Making  Medically screening exam initiated at 5:48 PM.  Appropriate orders placed.  Christina Bennett was informed that the remainder of the evaluation will be completed by another provider, this initial triage assessment does not replace that evaluation, and the importance of remaining in the ED until their evaluation is complete.  Christina Fusi, DO    Darnelle Corp L, DO 02/02/24 [phone]

## 2024-02-02 NOTE — ED Triage Notes (Signed)
 While in triage pt states her chest is starting to hurt

## 2024-02-02 NOTE — ED Provider Notes (Signed)
 [place] EMERGENCY DEPARTMENT AT [facility] Provider Note   CSN: [id number] Arrival date & time: 02/02/24  1525     History Chief Complaint  Patient presents with   Headache   Chest Pain    Christina Bennett is a 83 y.o. female w/ PMHx CAD, type 2 diabetes, hyperlipidemia, COPD who presents to the ED for evaluation of headache and generalized weakness.  She reports headache throughout the day.  She reports generalized weakness multiple days.  She reports poor p.o. intake.  No fevers chills neck pain.  Family is concerned that speech is different.  No focal numbness weakness.     Physical Exam Updated Vital Signs BP (!) 142/69 (BP Location: Right Arm)   Pulse 90   Temp 98.4 F (36.9 C) (Oral)   Resp 17   SpO2 95%  Physical Exam Vitals and nursing note reviewed.  Constitutional:      Appearance: She is well-developed. She is obese. She is ill-appearing.  HENT:     Head: Normocephalic and atraumatic.   Eyes:     General: No visual field deficit.    Extraocular Movements: Extraocular movements intact.     Conjunctiva/sclera: Conjunctivae normal.     Pupils: Pupils are equal, round, and reactive to light.    Cardiovascular:     Rate and Rhythm: Normal rate and regular rhythm.     Heart sounds: Normal heart sounds. No murmur heard. Pulmonary:     Effort: Pulmonary effort is normal. No respiratory distress.     Breath sounds: Normal breath sounds.  Abdominal:     Palpations: Abdomen is soft.     Tenderness: There is no abdominal tenderness.   Musculoskeletal:        General: No swelling.     Cervical back: Neck supple.   Skin:    General: Skin is warm and dry.     Capillary Refill: Capillary refill takes less than 2 seconds.   Neurological:     Mental Status: She is oriented to person, place, and time. She is lethargic.     Cranial Nerves: No cranial nerve deficit, dysarthria or facial asymmetry.     Sensory: No sensory deficit.     Coordination:  Coordination normal.   Psychiatric:        Mood and Affect: Mood normal.     ED Results / Procedures / Treatments   Labs (all labs ordered are listed, but only abnormal results are displayed) Labs Reviewed  BASIC METABOLIC PANEL WITH GFR - Abnormal; Notable for the following components:      Result Value   Glucose, Bld 202 (*)    Creatinine, Ser 1.05 (*)    GFR, Estimated 53 (*)    All other components within normal limits  CBC - Abnormal; Notable for the following components:   WBC 17.7 (*)    MCH 25.4 (*)    MCHC 29.7 (*)    RDW 16.8 (*)    All other components within normal limits  URINALYSIS, W/ REFLEX TO CULTURE (INFECTION SUSPECTED) - Abnormal; Notable for the following components:   APPearance HAZY (*)    Glucose, UA >=500 (*)    Leukocytes,Ua SMALL (*)    Bacteria, UA FEW (*)    All other components within normal limits  URINE CULTURE  TROPONIN I (HIGH SENSITIVITY)  TROPONIN I (HIGH SENSITIVITY)    EKG EKG Interpretation Date/Time:  Monday February 02 2024 16:15:20 EDT Ventricular Rate:  79 PR Interval:  136 QRS Duration:  88 QT Interval:  386 QTC Calculation: 442 R Axis:   0  Text Interpretation: Normal sinus rhythm ST & T wave abnormality, consider inferior ischemia Abnormal ECG When compared with ECG of 11-Jan-2004 16:56, PREVIOUS ECG IS PRESENT Confirmed by Patt Alm DEL [phone]) on 02/02/2024 9:48:51 PM  Radiology CT Head Wo Contrast Result Date: 02/02/2024 CLINICAL DATA:  Sinusitis, acute, orbital or intracranial complications suspected headache, paresthesias EXAM: CT HEAD WITHOUT CONTRAST TECHNIQUE: Contiguous axial images were obtained from the base of the skull through the vertex without intravenous contrast. RADIATION DOSE REDUCTION: This exam was performed according to the departmental dose-optimization program which includes automated exposure control, adjustment of the mA and/or kV according to patient size and/or use of iterative reconstruction technique.  COMPARISON:  None Available. FINDINGS: Brain: Normal anatomic configuration. Parenchymal volume loss is commensurate with the patient's age. Moderate periventricular white matter changes are present likely reflecting the sequela of small vessel ischemia. No abnormal intra or extra-axial mass lesion or fluid collection. No abnormal mass effect or midline shift. No evidence of acute intracranial hemorrhage or infarct. Ventricular size is normal. Cerebellum unremarkable. Vascular: No asymmetric hyperdense vasculature at the skull base. Skull: Intact Sinuses/Orbits: Paranasal sinuses are clear. Orbits are unremarkable. Other: Fluid opacification of several right mastoid air cells without associated osseous erosion. Left mastoid air cells and middle ear cavities are clear. IMPRESSION: 1. No evidence of acute intracranial hemorrhage or infarct. 2. Moderate periventricular white matter changes likely reflecting the sequela of small vessel ischemia. 3. Right mastoid effusion. 4. Paranasal sinuses are clear. Electronically Signed   By: Dorethia Molt M.D.   On: 02/02/2024 21:01   DG Chest 2 View Result Date: 02/02/2024 CLINICAL DATA:  Chest pain and shortness of breath EXAM: CHEST - 2 VIEW COMPARISON:  Chest radiograph dated 10/03/2020 FINDINGS: Patient is rotated to the right. Low lung volumes with bronchovascular crowding. Increased diffuse interstitial opacities, right-greater-than-left. No pleural effusion or pneumothorax. Enlarged cardiomediastinal silhouette. No acute osseous abnormality. IMPRESSION: 1. Increased diffuse interstitial opacities, right-greater-than-left, which may represent pulmonary edema or atypical infection. 2. Cardiomegaly. Electronically Signed   By: Limin  Xu M.D.   On: 02/02/2024 16:45    Medications Ordered in ED Medications  lactated ringers  bolus 500 mL (has no administration in time range)  acetaminophen  (TYLENOL ) tablet 1,000 mg (1,000 mg Oral Given 02/02/24 2217)  prochlorperazine  (COMPAZINE) injection 10 mg (10 mg Intravenous Given 02/02/24 2220)  dexamethasone (DECADRON) injection 10 mg (10 mg Intravenous Given 02/02/24 2220)  LORazepam (ATIVAN) tablet 2 mg (2 mg Oral Given 02/02/24 2216)  cefTRIAXone (ROCEPHIN) 1 g in sodium chloride  0.9 % 100 mL IVPB (1 g Intravenous New Bag/Given 02/02/24 2234)    ED Course/ Medical Decision Making/ A&P  Christina Bennett is a 83 y.o. female presents as detailed above  Differential ddx: CVA, TIA, dehydration, ACS, UTI, electrolyte abnormalities, failure to thrive, migraine  On arrival, patient afebrile hemodynamically stable no hypoxia or respiratory distress.  ED Work-up: Please see details of labs and imaging listed above. Urinalysis concerning for infection and patient found to have leukocytosis of 17 will administer dose of Rocephin.  CT head without contrast without acute abnormality.  Will obtain MRI to rule out acute CVA.  Will treat headache with Tylenol  Decadron fluids and Compazine. Patient received dose of Ativan to assist with claustrophobia and MRI brain did not UA concerning for infection.  Administer dose of ceftriaxone.  MRI pending at time of signout  Patient seen with supervising physician who agrees with plan.  Final Clinical Impression(s) / ED Diagnoses Final diagnoses:  Generalized weakness  Acute cystitis without hematuria      Waddell Seats, DO PGY-3 Emergency Medicine    Seats W[place]ell, DO 02/02/24 2315    Patt Alm Macho, MD 02/03/24 [phone]

## 2024-02-02 NOTE — ED Provider Notes (Signed)
  Provider Note MRN:  [id number]  Arrival date & time: 02/03/24    ED Course and Medical Decision Making  Assumed care of patient at sign-out or upon transfer.  Headache, weakness, swimmy headed, workup revealing leukocytosis and possible UTI.  Awaiting MRI.  Will try to admit as family is concerned about her going home.  Increased confusion and agitation in the setting of likely UTI.  Unable to tolerate MRI.  Admitted to medicine for further care.  Procedures  Final Clinical Impressions(s) / ED Diagnoses     ICD-10-CM   1. Generalized weakness  R53.1     2. Acute cystitis without hematuria  N30.00       ED Discharge Orders     None       Discharge Instructions   None     Ozell HERO. Theadore, MD [facility] [email or web address]    Theadore Ozell HERO, MD 02/03/24 [phone]

## 2024-02-03 ENCOUNTER — Other Ambulatory Visit: Payer: Self-pay

## 2024-02-03 ENCOUNTER — Encounter (HOSPITAL_COMMUNITY): Payer: Self-pay | Admitting: Internal Medicine

## 2024-02-03 DIAGNOSIS — E440 Moderate protein-calorie malnutrition: Secondary | ICD-10-CM | POA: Diagnosis not present

## 2024-02-03 DIAGNOSIS — R531 Weakness: Secondary | ICD-10-CM | POA: Diagnosis not present

## 2024-02-03 DIAGNOSIS — D500 Iron deficiency anemia secondary to blood loss (chronic): Secondary | ICD-10-CM | POA: Diagnosis not present

## 2024-02-03 DIAGNOSIS — Z8673 Personal history of transient ischemic attack (TIA), and cerebral infarction without residual deficits: Secondary | ICD-10-CM | POA: Diagnosis not present

## 2024-02-03 DIAGNOSIS — K219 Gastro-esophageal reflux disease without esophagitis: Secondary | ICD-10-CM | POA: Diagnosis not present

## 2024-02-03 DIAGNOSIS — E559 Vitamin D deficiency, unspecified: Secondary | ICD-10-CM | POA: Diagnosis not present

## 2024-02-03 DIAGNOSIS — D509 Iron deficiency anemia, unspecified: Secondary | ICD-10-CM | POA: Diagnosis not present

## 2024-02-03 DIAGNOSIS — I69391 Dysphagia following cerebral infarction: Secondary | ICD-10-CM | POA: Diagnosis not present

## 2024-02-03 DIAGNOSIS — E1169 Type 2 diabetes mellitus with other specified complication: Secondary | ICD-10-CM | POA: Diagnosis not present

## 2024-02-03 DIAGNOSIS — Z86718 Personal history of other venous thrombosis and embolism: Secondary | ICD-10-CM | POA: Diagnosis not present

## 2024-02-03 DIAGNOSIS — Z431 Encounter for attention to gastrostomy: Secondary | ICD-10-CM | POA: Diagnosis not present

## 2024-02-03 DIAGNOSIS — H538 Other visual disturbances: Secondary | ICD-10-CM | POA: Diagnosis present

## 2024-02-03 DIAGNOSIS — E1122 Type 2 diabetes mellitus with diabetic chronic kidney disease: Secondary | ICD-10-CM | POA: Diagnosis not present

## 2024-02-03 DIAGNOSIS — R4182 Altered mental status, unspecified: Secondary | ICD-10-CM | POA: Diagnosis present

## 2024-02-03 DIAGNOSIS — I10 Essential (primary) hypertension: Secondary | ICD-10-CM | POA: Diagnosis present

## 2024-02-03 DIAGNOSIS — R471 Dysarthria and anarthria: Secondary | ICD-10-CM | POA: Diagnosis present

## 2024-02-03 DIAGNOSIS — M159 Polyosteoarthritis, unspecified: Secondary | ICD-10-CM | POA: Diagnosis not present

## 2024-02-03 DIAGNOSIS — Z8679 Personal history of other diseases of the circulatory system: Secondary | ICD-10-CM | POA: Diagnosis not present

## 2024-02-03 DIAGNOSIS — I6389 Other cerebral infarction: Secondary | ICD-10-CM | POA: Diagnosis not present

## 2024-02-03 DIAGNOSIS — E66812 Obesity, class 2: Secondary | ICD-10-CM | POA: Diagnosis present

## 2024-02-03 DIAGNOSIS — E042 Nontoxic multinodular goiter: Secondary | ICD-10-CM | POA: Diagnosis not present

## 2024-02-03 DIAGNOSIS — Z515 Encounter for palliative care: Secondary | ICD-10-CM | POA: Diagnosis not present

## 2024-02-03 DIAGNOSIS — J9601 Acute respiratory failure with hypoxia: Secondary | ICD-10-CM | POA: Diagnosis not present

## 2024-02-03 DIAGNOSIS — R1310 Dysphagia, unspecified: Secondary | ICD-10-CM | POA: Diagnosis not present

## 2024-02-03 DIAGNOSIS — E785 Hyperlipidemia, unspecified: Secondary | ICD-10-CM | POA: Diagnosis not present

## 2024-02-03 DIAGNOSIS — F40240 Claustrophobia: Secondary | ICD-10-CM | POA: Diagnosis present

## 2024-02-03 DIAGNOSIS — E1151 Type 2 diabetes mellitus with diabetic peripheral angiopathy without gangrene: Secondary | ICD-10-CM | POA: Diagnosis not present

## 2024-02-03 DIAGNOSIS — N1831 Chronic kidney disease, stage 3a: Secondary | ICD-10-CM | POA: Diagnosis not present

## 2024-02-03 DIAGNOSIS — H02053 Trichiasis without entropian right eye, unspecified eyelid: Secondary | ICD-10-CM | POA: Diagnosis not present

## 2024-02-03 DIAGNOSIS — I69822 Dysarthria following other cerebrovascular disease: Secondary | ICD-10-CM | POA: Diagnosis not present

## 2024-02-03 DIAGNOSIS — I5032 Chronic diastolic (congestive) heart failure: Secondary | ICD-10-CM | POA: Diagnosis not present

## 2024-02-03 DIAGNOSIS — N3000 Acute cystitis without hematuria: Secondary | ICD-10-CM

## 2024-02-03 DIAGNOSIS — J9811 Atelectasis: Secondary | ICD-10-CM | POA: Diagnosis not present

## 2024-02-03 DIAGNOSIS — N183 Chronic kidney disease, stage 3 unspecified: Secondary | ICD-10-CM | POA: Insufficient documentation

## 2024-02-03 DIAGNOSIS — I517 Cardiomegaly: Secondary | ICD-10-CM | POA: Diagnosis not present

## 2024-02-03 DIAGNOSIS — J410 Simple chronic bronchitis: Secondary | ICD-10-CM

## 2024-02-03 DIAGNOSIS — G9341 Metabolic encephalopathy: Secondary | ICD-10-CM | POA: Diagnosis not present

## 2024-02-03 DIAGNOSIS — R079 Chest pain, unspecified: Secondary | ICD-10-CM | POA: Diagnosis not present

## 2024-02-03 DIAGNOSIS — J189 Pneumonia, unspecified organism: Secondary | ICD-10-CM

## 2024-02-03 DIAGNOSIS — I130 Hypertensive heart and chronic kidney disease with heart failure and stage 1 through stage 4 chronic kidney disease, or unspecified chronic kidney disease: Secondary | ICD-10-CM | POA: Diagnosis not present

## 2024-02-03 DIAGNOSIS — I6782 Cerebral ischemia: Secondary | ICD-10-CM | POA: Diagnosis not present

## 2024-02-03 DIAGNOSIS — I672 Cerebral atherosclerosis: Secondary | ICD-10-CM | POA: Diagnosis not present

## 2024-02-03 DIAGNOSIS — Z931 Gastrostomy status: Secondary | ICD-10-CM | POA: Diagnosis not present

## 2024-02-03 DIAGNOSIS — R29704 NIHSS score 4: Secondary | ICD-10-CM | POA: Diagnosis not present

## 2024-02-03 DIAGNOSIS — E870 Hyperosmolality and hypernatremia: Secondary | ICD-10-CM | POA: Diagnosis not present

## 2024-02-03 DIAGNOSIS — I6501 Occlusion and stenosis of right vertebral artery: Secondary | ICD-10-CM | POA: Diagnosis not present

## 2024-02-03 DIAGNOSIS — H168 Other keratitis: Secondary | ICD-10-CM | POA: Diagnosis not present

## 2024-02-03 DIAGNOSIS — I63541 Cerebral infarction due to unspecified occlusion or stenosis of right cerebellar artery: Secondary | ICD-10-CM | POA: Diagnosis not present

## 2024-02-03 DIAGNOSIS — E119 Type 2 diabetes mellitus without complications: Secondary | ICD-10-CM | POA: Diagnosis not present

## 2024-02-03 DIAGNOSIS — I6329 Cerebral infarction due to unspecified occlusion or stenosis of other precerebral arteries: Secondary | ICD-10-CM | POA: Diagnosis not present

## 2024-02-03 DIAGNOSIS — G459 Transient cerebral ischemic attack, unspecified: Secondary | ICD-10-CM | POA: Diagnosis not present

## 2024-02-03 DIAGNOSIS — N179 Acute kidney failure, unspecified: Secondary | ICD-10-CM | POA: Diagnosis not present

## 2024-02-03 DIAGNOSIS — J449 Chronic obstructive pulmonary disease, unspecified: Secondary | ICD-10-CM | POA: Diagnosis not present

## 2024-02-03 DIAGNOSIS — I639 Cerebral infarction, unspecified: Secondary | ICD-10-CM | POA: Diagnosis not present

## 2024-02-03 DIAGNOSIS — R1312 Dysphagia, oropharyngeal phase: Secondary | ICD-10-CM | POA: Diagnosis not present

## 2024-02-03 DIAGNOSIS — G8194 Hemiplegia, unspecified affecting left nondominant side: Secondary | ICD-10-CM | POA: Diagnosis not present

## 2024-02-03 DIAGNOSIS — R0902 Hypoxemia: Secondary | ICD-10-CM | POA: Diagnosis not present

## 2024-02-03 DIAGNOSIS — Z743 Need for continuous supervision: Secondary | ICD-10-CM | POA: Diagnosis not present

## 2024-02-03 DIAGNOSIS — I129 Hypertensive chronic kidney disease with stage 1 through stage 4 chronic kidney disease, or unspecified chronic kidney disease: Secondary | ICD-10-CM | POA: Diagnosis not present

## 2024-02-03 DIAGNOSIS — I700 Atherosclerosis of aorta: Secondary | ICD-10-CM | POA: Diagnosis not present

## 2024-02-03 DIAGNOSIS — Z7189 Other specified counseling: Secondary | ICD-10-CM | POA: Diagnosis not present

## 2024-02-03 DIAGNOSIS — J309 Allergic rhinitis, unspecified: Secondary | ICD-10-CM | POA: Diagnosis not present

## 2024-02-03 DIAGNOSIS — I6381 Other cerebral infarction due to occlusion or stenosis of small artery: Secondary | ICD-10-CM | POA: Diagnosis not present

## 2024-02-03 DIAGNOSIS — R29709 NIHSS score 9: Secondary | ICD-10-CM | POA: Diagnosis not present

## 2024-02-03 DIAGNOSIS — E1165 Type 2 diabetes mellitus with hyperglycemia: Secondary | ICD-10-CM | POA: Diagnosis not present

## 2024-02-03 DIAGNOSIS — I2510 Atherosclerotic heart disease of native coronary artery without angina pectoris: Secondary | ICD-10-CM | POA: Diagnosis not present

## 2024-02-03 DIAGNOSIS — I6523 Occlusion and stenosis of bilateral carotid arteries: Secondary | ICD-10-CM | POA: Diagnosis not present

## 2024-02-03 DIAGNOSIS — M6281 Muscle weakness (generalized): Secondary | ICD-10-CM | POA: Diagnosis not present

## 2024-02-03 DIAGNOSIS — Z66 Do not resuscitate: Secondary | ICD-10-CM | POA: Diagnosis not present

## 2024-02-03 DIAGNOSIS — I5033 Acute on chronic diastolic (congestive) heart failure: Secondary | ICD-10-CM | POA: Diagnosis not present

## 2024-02-03 LAB — RESPIRATORY PANEL BY PCR

## 2024-02-03 LAB — COMPREHENSIVE METABOLIC PANEL WITH GFR
ALT: 8 U/L (ref 0–44)
AST: 14 U/L — ABNORMAL LOW (ref 15–41)
Albumin: 3.2 g/dL — ABNORMAL LOW (ref 3.5–5.0)
Alkaline Phosphatase: 98 U/L (ref 38–126)
Anion gap: 10 (ref 5–15)
BUN: 19 mg/dL (ref 8–23)
CO2: 22 mmol/L (ref 22–32)
Calcium: 8.6 mg/dL — ABNORMAL LOW (ref 8.9–10.3)
Chloride: 103 mmol/L (ref 98–111)
Creatinine, Ser: 1.08 mg/dL — ABNORMAL HIGH (ref 0.44–1.00)
GFR, Estimated: 52 mL/min — ABNORMAL LOW (ref >60)
Glucose, Bld: 265 mg/dL — ABNORMAL HIGH (ref 70–99)
Potassium: 4.5 mmol/L (ref 3.5–5.1)
Sodium: 135 mmol/L (ref 135–145)
Total Bilirubin: 0.6 mg/dL (ref 0.0–1.2)
Total Protein: 6.9 g/dL (ref 6.5–8.1)

## 2024-02-03 LAB — CBC
HCT: 43.4 % (ref 36.0–46.0)
Hemoglobin: 12.8 g/dL (ref 12.0–15.0)
MCH: 25.1 pg — ABNORMAL LOW (ref 26.0–34.0)
MCHC: 29.5 g/dL — ABNORMAL LOW (ref 30.0–36.0)
MCV: 85.3 fL (ref 80.0–100.0)
Platelets: 266 10*3/uL (ref 150–400)
RBC: 5.09 MIL/uL (ref 3.87–5.11)
RDW: 16.6 % — ABNORMAL HIGH (ref 11.5–15.5)
WBC: 15.8 10*3/uL — ABNORMAL HIGH (ref 4.0–10.5)
nRBC: 0 % (ref 0.0–0.2)

## 2024-02-03 LAB — GLUCOSE, CAPILLARY
Glucose-Capillary: 178 mg/dL — ABNORMAL HIGH (ref 70–99)
Glucose-Capillary: 145 mg/dL — ABNORMAL HIGH (ref 70–99)

## 2024-02-03 LAB — CBG MONITORING, ED
Glucose-Capillary: 253 mg/dL — ABNORMAL HIGH (ref 70–99)
Glucose-Capillary: 203 mg/dL — ABNORMAL HIGH (ref 70–99)

## 2024-02-03 LAB — URINE CULTURE

## 2024-02-03 LAB — TSH: TSH: 1.017 u[IU]/mL (ref 0.350–4.500)

## 2024-02-03 LAB — PROCALCITONIN: Procalcitonin: <0.1 ng/mL

## 2024-02-03 LAB — HEMOGLOBIN A1C
Hgb A1c MFr Bld: 9.2 % — ABNORMAL HIGH (ref 4.8–5.6)
Mean Plasma Glucose: 217.34 mg/dL

## 2024-02-03 MED ORDER — ZIPRASIDONE MESYLATE 20 MG IM SOLR
10.0000 mg | Freq: Once | INTRAMUSCULAR | Status: AC
  Administered 2024-02-03: 10 mg via INTRAMUSCULAR
  Filled 2024-02-03: qty 20

## 2024-02-03 MED ORDER — MONTELUKAST SODIUM 10 MG PO TABS
10.0000 mg | ORAL_TABLET | Freq: Every evening | ORAL | Status: DC
  Administered 2024-02-03 – 2024-02-24 (×21): 10 mg via ORAL
  Filled 2024-02-03 (×23): qty 1

## 2024-02-03 MED ORDER — IPRATROPIUM-ALBUTEROL 0.5-2.5 (3) MG/3ML IN SOLN: 3.0000 mL | Freq: Four times a day (QID) | RESPIRATORY_TRACT | Status: DC | PRN

## 2024-02-03 MED ORDER — SIMVASTATIN 20 MG PO TABS
40.0000 mg | ORAL_TABLET | Freq: Every evening | ORAL | Status: DC
  Administered 2024-02-03 – 2024-02-04 (×2): 40 mg via ORAL
  Filled 2024-02-03 (×2): qty 2

## 2024-02-03 MED ORDER — ACETAMINOPHEN 10 MG/ML IV SOLN: 1000.0000 mg | Freq: Three times a day (TID) | INTRAVENOUS | Status: DC | PRN

## 2024-02-03 MED ORDER — SODIUM CHLORIDE 0.9 % IV SOLN: 250.0000 mL | INTRAVENOUS | Status: AC | PRN

## 2024-02-03 MED ORDER — IPRATROPIUM-ALBUTEROL 0.5-2.5 (3) MG/3ML IN SOLN
3.0000 mL | Freq: Once | RESPIRATORY_TRACT | Status: DC
  Filled 2024-02-03: qty 3

## 2024-02-03 MED ORDER — STERILE WATER FOR INJECTION IJ SOLN
INTRAMUSCULAR | Status: AC
  Administered 2024-02-03: 1.2 mL
  Filled 2024-02-03: qty 10

## 2024-02-03 MED ORDER — SODIUM CHLORIDE 0.9% FLUSH: 3.0000 mL | INTRAVENOUS | Status: DC | PRN

## 2024-02-03 MED ORDER — DEXTROSE 50 % IV SOLN: 50.0000 mL | INTRAVENOUS | Status: DC | PRN

## 2024-02-03 MED ORDER — ONDANSETRON HCL 4 MG PO TABS: 4.0000 mg | ORAL_TABLET | Freq: Four times a day (QID) | ORAL | Status: DC | PRN

## 2024-02-03 MED ORDER — LOSARTAN POTASSIUM 50 MG PO TABS
100.0000 mg | ORAL_TABLET | Freq: Every day | ORAL | Status: DC
  Administered 2024-02-04: 100 mg via ORAL
  Filled 2024-02-03 (×2): qty 2

## 2024-02-03 MED ORDER — DROPERIDOL 2.5 MG/ML IJ SOLN
2.5000 mg | Freq: Once | INTRAMUSCULAR | Status: DC
  Administered 2024-02-03: 2.5 mg via INTRAVENOUS
  Filled 2024-02-03: qty 2

## 2024-02-03 MED ORDER — SODIUM CHLORIDE 0.9 % IV SOLN
100.0000 mg | Freq: Two times a day (BID) | INTRAVENOUS | Status: DC
  Administered 2024-02-03 – 2024-02-04 (×3): 100 mg via INTRAVENOUS
  Filled 2024-02-03 (×4): qty 100

## 2024-02-03 MED ORDER — HEPARIN SODIUM (PORCINE) 5000 UNIT/ML IJ SOLN
5000.0000 [IU] | Freq: Three times a day (TID) | INTRAMUSCULAR | Status: DC
Start: 2024-02-03 — End: 2024-02-05
  Administered 2024-02-03 – 2024-02-04 (×5): 5000 [IU] via SUBCUTANEOUS
  Filled 2024-02-03 (×6): qty 1

## 2024-02-03 MED ORDER — INSULIN ASPART 100 UNIT/ML IJ SOLN
0.0000 [IU] | INTRAMUSCULAR | Status: DC
  Administered 2024-02-03: 3 [IU] via SUBCUTANEOUS
  Administered 2024-02-03: 1 [IU] via SUBCUTANEOUS
  Administered 2024-02-03: 2 [IU] via SUBCUTANEOUS
  Administered 2024-02-04: 1 [IU] via SUBCUTANEOUS
  Administered 2024-02-04: 2 [IU] via SUBCUTANEOUS
  Administered 2024-02-04 – 2024-02-05 (×3): 1 [IU] via SUBCUTANEOUS
  Administered 2024-02-05: 2 [IU] via SUBCUTANEOUS
  Administered 2024-02-05 – 2024-02-06 (×4): 1 [IU] via SUBCUTANEOUS

## 2024-02-03 MED ORDER — SODIUM CHLORIDE 0.9% FLUSH
3.0000 mL | Freq: Two times a day (BID) | INTRAVENOUS | Status: DC
  Administered 2024-02-03 – 2024-02-25 (×43): 3 mL via INTRAVENOUS

## 2024-02-03 MED ORDER — HYDRALAZINE HCL 20 MG/ML IJ SOLN: 10.0000 mg | Freq: Four times a day (QID) | INTRAMUSCULAR | Status: DC | PRN

## 2024-02-03 MED ORDER — LORAZEPAM 2 MG/ML IJ SOLN: 1.0000 mg | Freq: Four times a day (QID) | INTRAMUSCULAR | Status: DC | PRN

## 2024-02-03 MED ORDER — HALOPERIDOL LACTATE 5 MG/ML IJ SOLN
2.0000 mg | Freq: Four times a day (QID) | INTRAMUSCULAR | Status: DC | PRN
  Administered 2024-02-03 – 2024-02-11 (×6): 2 mg via INTRAVENOUS
  Filled 2024-02-03 (×6): qty 1

## 2024-02-03 MED ORDER — PANTOPRAZOLE SODIUM 40 MG PO TBEC
40.0000 mg | DELAYED_RELEASE_TABLET | Freq: Every day | ORAL | Status: DC
  Administered 2024-02-03 – 2024-02-04 (×2): 40 mg via ORAL
  Filled 2024-02-03 (×2): qty 1

## 2024-02-03 MED ORDER — DIPHENHYDRAMINE HCL 50 MG/ML IJ SOLN
50.0000 mg | Freq: Four times a day (QID) | INTRAMUSCULAR | Status: DC | PRN
  Administered 2024-02-15 – 2024-02-17 (×3): 50 mg via INTRAVENOUS
  Filled 2024-02-03 (×3): qty 1

## 2024-02-03 MED ORDER — LACTATED RINGERS IV SOLN: INTRAVENOUS | Status: DC

## 2024-02-03 MED ORDER — SODIUM CHLORIDE 0.9 % IV SOLN
1.0000 g | INTRAVENOUS | Status: DC
  Administered 2024-02-03: 1 g via INTRAVENOUS
  Filled 2024-02-03 (×2): qty 10

## 2024-02-03 MED ORDER — ORAL CARE MOUTH RINSE: 15.0000 mL | OROMUCOSAL | Status: DC | PRN

## 2024-02-03 MED ORDER — ONDANSETRON HCL 4 MG/2ML IJ SOLN: 4.0000 mg | Freq: Four times a day (QID) | INTRAMUSCULAR | Status: DC | PRN

## 2024-02-03 MED ORDER — BUDESON-GLYCOPYRROL-FORMOTEROL 160-9-4.8 MCG/ACT IN AERO
2.0000 | INHALATION_SPRAY | Freq: Two times a day (BID) | RESPIRATORY_TRACT | Status: DC
  Administered 2024-02-04 – 2024-02-16 (×21): 2 via RESPIRATORY_TRACT
  Filled 2024-02-03 (×3): qty 5.9

## 2024-02-03 MED ORDER — HYDROCHLOROTHIAZIDE 25 MG PO TABS
25.0000 mg | ORAL_TABLET | Freq: Every day | ORAL | Status: DC
  Administered 2024-02-04: 25 mg via ORAL
  Filled 2024-02-03: qty 1

## 2024-02-03 MED ORDER — SODIUM CHLORIDE 0.9 % IV SOLN
500.0000 mg | Freq: Once | INTRAVENOUS | Status: DC
  Filled 2024-02-03: qty 5

## 2024-02-03 NOTE — ED Notes (Signed)
 Niece Christina Bennett updated by [employer]

## 2024-02-03 NOTE — ED Notes (Signed)
 Pt keeps moving arm while checking BP

## 2024-02-03 NOTE — ED Notes (Signed)
 Pt keeps ripping cardiac leads off. Pt educated on importance of keeping them on, pt keeps taking them off.

## 2024-02-03 NOTE — ED Notes (Signed)
 Awaiting verification for 1000 meds

## 2024-02-03 NOTE — Evaluation (Signed)
 Clinical/Bedside Swallow Evaluation Patient Details  Name: Christina Bennett MRN: [id number] Date of Birth: [birth year]  Today's Date: 02/03/2024 Time: SLP Start Time (ACUTE ONLY): 1322 SLP Stop Time (ACUTE ONLY): 1335 SLP Time Calculation (min) (ACUTE ONLY): 13 min  Past Medical History:  Past Medical History:  Diagnosis Date   Anemia    iron insufion on 05/2016    Atypical chest pain    Atypical CP--stress test, cor angio, and CT chest negative in 2005. Nuclear study normal in 2011    COPD (chronic obstructive pulmonary disease) (HCC)    Diabetes (HCC)    Diabetes type 2- 1/13- metformin started   Dyslipidemia    Endometriosis    with ovarian radiation in 1966, gyn Dr cousins   GERD (gastroesophageal reflux disease)    H/H   Herpes    herpes of the left eye, Dr gust optho   History of DVT (deep vein thrombosis)    on OCP   History of kidney stones    Hypertension    Mild anemia    WBC mild high, lab 2012/ HEMATOLOGY    Osteoarthritis    Osteoarthritis of the hip, knee, and hand, vicodin , prn   Osteopenia    BD in 2013   Tobacco use    quit 03/2008   Vitamin D deficiency    Past Surgical History:  Past Surgical History:  Procedure Laterality Date   APPENDECTOMY     BALLOON DILATION N/A 07/09/2016   Procedure: BALLOON DILATION;  Surgeon: Gladis MARLA Louder, MD;  Location: [facility];  Service: Endoscopy;  Laterality: N/A;   COLONOSCOPY WITH PROPOFOL  N/A 07/09/2016   Procedure: COLONOSCOPY WITH PROPOFOL ;  Surgeon: Gladis MARLA Louder, MD;  Location: [facility];  Service: Endoscopy;  Laterality: N/A;   ESOPHAGOGASTRODUODENOSCOPY N/A 07/09/2016   Procedure: ESOPHAGOGASTRODUODENOSCOPY (EGD);  Surgeon: Gladis MARLA Louder, MD;  Location: [facility];  Service: Endoscopy;  Laterality: N/A;   EYE SURGERY     left eye cataract srugery    HERNIA REPAIR     surgery fro endometriosis     TONSILLECTOMY     HPI:  Patient is an 83 y.o. female with PMH: CAD, COPD, essential HTN,  CKD stage III, morbid obesity. She presented to the Ed on 02/03/24 with multiple complaints inclucding generalized weakeness, HA, poor appetite, chest congestion, blurry vision and family reporting change in her speech. ED evaluation found her to be hemodynamically stable but with BP up to 183/86, UA showed evidence of UTI. CT head negative for acute intracranial abnormality or hemorrhage but did show moderate periventricular WM changes likely reflecting the sequela of small vessel ischemia. CXR showed diffuse interstitial opacities, R>L. While in ED, patient became very restless, anxious and fidgety. Despite multiple doses of Ativan to attempt MRI,, it was canceled due to patient not cooperating. Non-violent restraints placed. SLP swallow evalauation ordered secondary to family reports of swallowing difficulities as well as RN observing patient to not be able to swallow even smaller pills, and coughing with liquids.    Assessment / Plan / Recommendation  Clinical Impression  Patient presents with clinical s/s of what appears to be a cognitive-based dysphagia which is expected to resolve when her confusion and agitation resolves. 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 Patient was confused, agitated and only accepted a few straw sips of thin liquids (water, juice). Suspected swallow initiation delay observed but  no overt s/s aspiration observed. SLP is recommending a modified barium swallow study when she is not so confused. In addition, recommend to continue on current PO diet but to only feed when fully awake and alert. SLP Visit Diagnosis: Dysphagia, unspecified (R13.10)    Aspiration Risk  Mild aspiration risk    Diet Recommendation Other (Comment) (continue with regular solids, thin liquids but don't feed when patient lethargic and/or too confused/agitated)    Liquid Administration  via: Cup;Straw Medication Administration: Crushed with puree Supervision: Full supervision/cueing for compensatory strategies;Staff to assist with self feeding Compensations: Slow rate;Small sips/bites;Minimize environmental distractions Postural Changes: Seated upright at 90 degrees;Remain upright for at least 30 minutes after po intake    Other  Recommendations Oral Care Recommendations: Oral care BID;Staff/trained caregiver to provide oral care     Assistance Recommended at Discharge    Functional Status Assessment Patient has had a recent decline in their functional status and demonstrates the ability to make significant improvements in function in a reasonable and predictable amount of time.  Frequency and Duration min 1 x/week  1 week       Prognosis Prognosis for improved oropharyngeal function: Good      Swallow Study   General Date of Onset: 02/03/24 HPI: Patient is an 83 y.o. female with PMH: CAD, COPD, essential HTN, CKD stage III, morbid obesity. She presented to the Ed on 02/03/24 with multiple complaints inclucding generalized weakeness, HA, poor appetite, chest congestion, blurry vision and family reporting change in her speech. ED evaluation found her to be hemodynamically stable but with BP up to 183/86, UA showed evidence of UTI. CT head negative for acute intracranial abnormality or hemorrhage but did show moderate periventricular WM changes likely reflecting the sequela of small vessel ischemia. CXR showed diffuse interstitial opacities, R>L. While in ED, patient became very restless, anxious and fidgety. Despite multiple doses of Ativan to attempt MRI,, it was canceled due to patient not cooperating. Non-violent restraints placed. SLP swallow evalauation ordered secondary to family reports of swallowing difficulities as well as RN observing patient to not be able to swallow even smaller pills, and coughing with liquids. Type of Study: Bedside Swallow Evaluation Previous  Swallow Assessment: none found Diet Prior to this Study: Regular;Thin liquids (Level 0) Temperature Spikes Noted: No Respiratory Status: Nasal cannula History of Recent Intubation: No Behavior/Cognition: Alert;Confused;Agitated;Requires cueing Oral Cavity Assessment: Within Functional Limits Oral Care Completed by SLP: No Oral Cavity - Dentition: Poor condition Self-Feeding Abilities: Total assist Patient Positioning: Upright in bed Baseline Vocal Quality: Normal Volitional Swallow: Unable to elicit    Oral/Motor/Sensory Function Overall Oral Motor/Sensory Function: Other (comment) (no focal weakness but did not follow directions for OME)   Ice Chips     Thin Liquid Thin Liquid: Impaired Presentation: Straw Pharyngeal  Phase Impairments: Suspected delayed Swallow    Nectar Thick     Honey Thick     Puree Puree: Not tested   Solid     Solid: Not tested     Norleen IVAR Blase, MA, CCC-SLP Speech Therapy

## 2024-02-03 NOTE — Plan of Care (Signed)
  Problem: Safety: Goal: Non-violent Restraint(s) Outcome: Progressing   Problem: Education: Goal: Ability to describe self-care measures that may prevent or decrease complications (Diabetes Survival Skills Education) will improve Outcome: Progressing Goal: Individualized Educational Video(s) Outcome: Progressing   Problem: Coping: Goal: Ability to adjust to condition or change in health will improve Outcome: Progressing   Problem: Fluid Volume: Goal: Ability to maintain a balanced intake and output will improve Outcome: Progressing   Problem: Health Behavior/Discharge Planning: Goal: Ability to identify and utilize available resources and services will improve Outcome: Progressing Goal: Ability to manage health-related needs will improve Outcome: Progressing   Problem: Metabolic: Goal: Ability to maintain appropriate glucose levels will improve Outcome: Progressing   Problem: Nutritional: Goal: Maintenance of adequate nutrition will improve Outcome: Progressing Goal: Progress toward achieving an optimal weight will improve Outcome: Progressing   Problem: Skin Integrity: Goal: Risk for impaired skin integrity will decrease Outcome: Progressing   Problem: Tissue Perfusion: Goal: Adequacy of tissue perfusion will improve Outcome: Progressing   Problem: Education: Goal: Knowledge of General Education information will improve Description: Including pain rating scale, medication(s)/side effects and non-pharmacologic comfort measures Outcome: Progressing

## 2024-02-03 NOTE — ED Notes (Signed)
 MD notified of patient's inability to remain cooperative in bed. Patient pulling at IV and restraint. Niece called and updated.

## 2024-02-03 NOTE — ED Notes (Signed)
 Pt placed on 3L o2 via nasal cannula

## 2024-02-03 NOTE — ED Notes (Signed)
 Pt had coughing episode after admin of protonix. MD notified and losartan held.

## 2024-02-03 NOTE — ED Notes (Signed)
 2W called by this RN, patient ready to come up

## 2024-02-03 NOTE — ED Notes (Signed)
 MD at bedside.

## 2024-02-03 NOTE — ED Notes (Signed)
 Patient consistently pulling at cords. MD notified and aware.

## 2024-02-03 NOTE — ED Notes (Signed)
 Patient repositioned in bed by RN

## 2024-02-03 NOTE — ED Notes (Signed)
 Sitter at bedside.

## 2024-02-03 NOTE — ED Notes (Signed)
 Speech at bedside

## 2024-02-03 NOTE — H&P (Addendum)
 History and Physical    Christina Bennett FMW:[id number] DOB: 1941-06-20 DOA: 02/03/2024  PCP: Ransom Other, MD   Patient coming from: Home   Chief Complaint:  Chief Complaint  Patient presents with   Headache   Chest Pain   ED TRIAGE note:    BIB EMS from home. PT complains of headache. Decreased appetite hasn't had anything to eat or drink for 3 days. PT states she is swimmy headed and blurred vision. Pt states it happened earlier this morning not sure what time. Pt hasn't felt well for a few days.      HPI:  Christina Bennett is a 83 y.o. female with medical history significant of CAD, COPD, essential hypertension, CKD stage III and morbid obesity presented to emergency department with multiple complaints include generalized weakness, headache, poor appetite, chest congestion, blurry vision and family reported patient has different speech.  Patient reported that she has headache throughout the day with poor oral intake.  Patient does not have any focal or generalized weakness and dysphagia.  Patient's niece at the bedside reported that at the baseline patient is independent and lives by herself.   Patient is reported that 1 of patient's friend lives nearby who noticed that patient is is complaining about headache throughout the day has a difficult speech and they thought patient might have a stroke which is why they called EMS. Patient initially reported that she has not eaten anything for almost 3 days and having severe headache with some blurry vision.  Exactly not sure what happened earlier today however patient reported she felt well few days ago.  Unable to get further history neither from the patient and niece at the bedside.   ED Course:  At presentation to ED patient found hemodynamically stable however blood pressure has been up to 183/86. BMP showed creatinine 1.05 and GFR 53.  Renal function at baseline. CBC showing leukocytosis 7.7, stable H&H normal platelet  count. Troponin x 2 within normal range.  UA show evidence of UTI pending urine culture. EKG normal sinus rhythm, heart rate 7 9.  Nonspecific ST-T wave abnormality and lead II, aVF.  CT head no acute intracranial abnormality and hemorrhage.Moderate periventricular white matter changes likely reflecting the sequela of small vessel ischemia. 3. Right mastoid effusion. 4. Paranasal sinuses are clear.  Chest x-ray showed increased diffusediffuse interstitial opacities, right-greater-than-left, which may represent pulmonary edema or atypical infection. 2. Cardiomegaly.  With the concern for pneumonia and UTI in the ED patient has been treated with ceftriaxone azithromycin.  While in the ED patient become very restless anxious and fidgety unable to obtain MRI required multiple doses of Ativan.  The MRI eventually has been canceled as patient is not cooperating.  Nonviolent restraint has been placed while in the ED post multiple doses of IV Ativan.  Hospitalist has been consulted for further management of altered mental status (agitation and confusion), community-acquired pneumonia and acute cystitis.  Significant labs in the ED: Lab Orders         Urine Culture         Expectorated Sputum Assessment w Gram Stain, Rflx to Resp Cult         Culture, blood (routine x 2) Call MD if unable to obtain prior to antibiotics being given         Respiratory (~20 pathogens) panel by PCR         Basic metabolic panel         CBC  Urinalysis, w/ Reflex to Culture (Infection Suspected) -Urine, Clean Catch         Legionella Pneumophila Serogp 1 Ur Ag         Strep pneumoniae urinary antigen         Procalcitonin         CBC         Comprehensive metabolic panel         TSH         Ammonia       Review of Systems:  Review of Systems  Unable to perform ROS: Mental status change    Past Medical History:  Diagnosis Date   Anemia    iron insufion on 05/2016    Atypical chest pain     Atypical CP--stress test, cor angio, and CT chest negative in 2005. Nuclear study normal in 2011    COPD (chronic obstructive pulmonary disease) (HCC)    Diabetes (HCC)    Diabetes type 2- 1/13- metformin started   Dyslipidemia    Endometriosis    with ovarian radiation in 1966, gyn Dr cousins   GERD (gastroesophageal reflux disease)    H/H   Herpes    herpes of the left eye, Dr gust optho   History of DVT (deep vein thrombosis)    on OCP   History of kidney stones    Hypertension    Mild anemia    WBC mild high, lab 2012/ HEMATOLOGY    Osteoarthritis    Osteoarthritis of the hip, knee, and hand, vicodin , prn   Osteopenia    BD in 2013   Tobacco use    quit 03/2008   Vitamin D deficiency     Past Surgical History:  Procedure Laterality Date   APPENDECTOMY     BALLOON DILATION N/A 07/09/2016   Procedure: BALLOON DILATION;  Surgeon: Gladis MARLA Louder, MD;  Location: [facility];  Service: Endoscopy;  Laterality: N/A;   COLONOSCOPY WITH PROPOFOL  N/A 07/09/2016   Procedure: COLONOSCOPY WITH PROPOFOL ;  Surgeon: Gladis MARLA Louder, MD;  Location: [facility];  Service: Endoscopy;  Laterality: N/A;   ESOPHAGOGASTRODUODENOSCOPY N/A 07/09/2016   Procedure: ESOPHAGOGASTRODUODENOSCOPY (EGD);  Surgeon: Gladis MARLA Louder, MD;  Location: [facility];  Service: Endoscopy;  Laterality: N/A;   EYE SURGERY     left eye cataract srugery    HERNIA REPAIR     surgery fro endometriosis     TONSILLECTOMY       reports that she has quit smoking. She has never used smokeless tobacco. She reports that she does not drink alcohol and does not use drugs.  Allergies  Allergen Reactions   Aspirin Swelling and Rash    Throat swells.   Bee Venom Anaphylaxis   Ibuprofen Shortness Of Breath and Swelling    Throat swells.   Percocet [Oxycodone-Acetaminophen ] Itching    Family History  Problem Relation Age of Onset   Hypertension Mother    Heart disease Brother    Heart disease Brother      Prior to Admission medications   Medication Sig Start Date End Date Taking? Authorizing Provider  albuterol  (PROVENTIL  HFA;VENTOLIN  HFA) 108 (90 BASE) MCG/ACT inhaler Inhale 2 puffs into the lungs every 4 (four) hours as needed for wheezing or shortness of breath.     [provider]  BREZTRI AEROSPHERE 160-9-4.8 MCG/ACT AERO Inhale 2 puffs into the lungs 2 (two) times daily. 03/19/21   [provider]  fluticasone (FLONASE) 50 MCG/ACT nasal spray Place  2 sprays into both nostrils every evening.  08/24/14   [provider]  glimepiride (AMARYL) 2 MG tablet Take 3 mg by mouth daily. 09/16/22   [provider]  hydrochlorothiazide (HYDRODIURIL) 25 MG tablet Take 25 mg by mouth daily. 10/26/18   [provider]  HYDROcodone -acetaminophen  (NORCO) 7.5-325 MG tablet Take 1 tablet by mouth 2 (two) times daily as needed for moderate pain.     [provider]  lidocaine  (LIDODERM ) 5 % Place 1 patch onto the skin daily. Remove & Discard patch within 12 hours or as directed by MD 11/01/19   Joy, Shawn C, PA-C  losartan (COZAAR) 100 MG tablet Take 100 mg by mouth daily. 10/26/18   [provider]  methocarbamol  (ROBAXIN ) 500 MG tablet Take 1 tablet (500 mg total) by mouth 2 (two) times daily. 11/01/19   Joy, Shawn C, PA-C  montelukast (SINGULAIR) 10 MG tablet Take 10 mg by mouth every evening.  01/31/16   [provider]  omeprazole (PRILOSEC) 20 MG capsule Take 20 mg by mouth daily.    [provider]  polyvinyl alcohol (LIQUIFILM TEARS) 1.4 % ophthalmic solution Place 1 drop into the left eye daily.    [provider]  prednisoLONE acetate (PRED FORTE) 1 % ophthalmic suspension Place 1 drop into the left eye daily. 09/12/22   [provider]  simvastatin (ZOCOR) 40 MG tablet Take 40 mg by mouth every evening.    [provider]  valACYclovir (VALTREX) 500 MG tablet Take 500 mg by mouth 2 (two) times daily.   01/31/16   [provider]  Vitamin D, Ergocalciferol, (DRISDOL) 50000 UNITS CAPS Take 50,000 Units by mouth every 14 (fourteen) days.    [provider]     Physical Exam: Vitals:   02/02/24 1547 02/02/24 1901 02/02/24 2245 02/03/24 0010  BP: 122/63 (!) 143/88 (!) 142/69 (!) 183/86  Pulse: 80 75 90 92  Resp: 19 17 17 17   Temp: (!) 97.5 F (36.4 C) 97.9 F (36.6 C) 98.4 F (36.9 C)   TempSrc: Oral  Oral   SpO2: 96% 97% 95%     Physical Exam Constitutional:      Appearance: She is obese. She is not ill-appearing.   Eyes:     Pupils: Pupils are equal, round, and reactive to light.    Cardiovascular:     Rate and Rhythm: Normal rate and regular rhythm.     Heart sounds: Normal heart sounds.  Pulmonary:     Effort: Pulmonary effort is normal.     Breath sounds: Normal breath sounds.  Abdominal:     Palpations: Abdomen is soft.   Musculoskeletal:     Cervical back: Neck supple.   Skin:    General: Skin is warm.     Capillary Refill: Capillary refill takes less than 2 seconds.   Neurological:     GCS: GCS eye subscore is 3. GCS verbal subscore is 2. GCS motor subscore is 4.     Comments: Altered mental status with agitation and confusion.  Intermittently opens eyes.  Unable to follow command.  GCS 9.  Psychiatric:     Comments: Unable to assess      Labs on Admission: I have personally reviewed following labs and imaging studies  CBC: Recent Labs  Lab 02/02/24 1603  WBC 17.7*  HGB 12.9  HCT 43.5  MCV 85.8  PLT 289   Basic Metabolic Panel: Recent Labs  Lab 02/02/24 1603  NA  138  K 4.8  CL 101  CO2 22  GLUCOSE 202*  BUN 17  CREATININE 1.05*  CALCIUM 9.1   GFR: CrCl cannot be calculated (Unknown ideal weight.). Liver Function Tests: No results for input(s): AST, ALT, ALKPHOS, BILITOT, PROT, ALBUMIN in the last 168 hours. No results for input(s): LIPASE, AMYLASE in the last 168 hours. No results for input(s):  AMMONIA in the last 168 hours. Coagulation Profile: No results for input(s): INR, PROTIME in the last 168 hours. Cardiac Enzymes: Recent Labs  Lab 02/02/24 1603 02/02/24 1834  TROPONINIHS 7 8   BNP (last 3 results) No results for input(s): BNP in the last 8760 hours. HbA1C: No results for input(s): HGBA1C in the last 72 hours. CBG: No results for input(s): GLUCAP in the last 168 hours. Lipid Profile: No results for input(s): CHOL, HDL, LDLCALC, TRIG, CHOLHDL, LDLDIRECT in the last 72 hours. Thyroid  Function Tests: No results for input(s): TSH, T4TOTAL, FREET4, T3FREE, THYROIDAB in the last 72 hours. Anemia Panel: No results for input(s): VITAMINB12, FOLATE, FERRITIN, TIBC, IRON, RETICCTPCT in the last 72 hours. Urine analysis:    Component Value Date/Time   COLORURINE YELLOW 02/02/2024 2111   APPEARANCEUR HAZY (A) 02/02/2024 2111   LABSPEC 1.028 02/02/2024 2111   PHURINE 5.0 02/02/2024 2111   GLUCOSEU >=500 (A) 02/02/2024 2111   HGBUR NEGATIVE 02/02/2024 2111   BILIRUBINUR NEGATIVE 02/02/2024 2111   KETONESUR NEGATIVE 02/02/2024 2111   PROTEINUR NEGATIVE 02/02/2024 2111   NITRITE NEGATIVE 02/02/2024 2111   LEUKOCYTESUR SMALL (A) 02/02/2024 2111    Radiological Exams on Admission: I have personally reviewed images CT Head Wo Contrast Result Date: 02/02/2024 CLINICAL DATA:  Sinusitis, acute, orbital or intracranial complications suspected headache, paresthesias EXAM: CT HEAD WITHOUT CONTRAST TECHNIQUE: Contiguous axial images were obtained from the base of the skull through the vertex without intravenous contrast. RADIATION DOSE REDUCTION: This exam was performed according to the departmental dose-optimization program which includes automated exposure control, adjustment of the mA and/or kV according to patient size and/or use of iterative reconstruction technique. COMPARISON:  None Available. FINDINGS: Brain: Normal anatomic  configuration. Parenchymal volume loss is commensurate with the patient's age. Moderate periventricular white matter changes are present likely reflecting the sequela of small vessel ischemia. No abnormal intra or extra-axial mass lesion or fluid collection. No abnormal mass effect or midline shift. No evidence of acute intracranial hemorrhage or infarct. Ventricular size is normal. Cerebellum unremarkable. Vascular: No asymmetric hyperdense vasculature at the skull base. Skull: Intact Sinuses/Orbits: Paranasal sinuses are clear. Orbits are unremarkable. Other: Fluid opacification of several right mastoid air cells without associated osseous erosion. Left mastoid air cells and middle ear cavities are clear. IMPRESSION: 1. No evidence of acute intracranial hemorrhage or infarct. 2. Moderate periventricular white matter changes likely reflecting the sequela of small vessel ischemia. 3. Right mastoid effusion. 4. Paranasal sinuses are clear. Electronically Signed   By: Dorethia Molt M.D.   On: 02/02/2024 21:01   DG Chest 2 View Result Date: 02/02/2024 CLINICAL DATA:  Chest pain and shortness of breath EXAM: CHEST - 2 VIEW COMPARISON:  Chest radiograph dated 10/03/2020 FINDINGS: Patient is rotated to the right. Low lung volumes with bronchovascular crowding. Increased diffuse interstitial opacities, right-greater-than-left. No pleural effusion or pneumothorax. Enlarged cardiomediastinal silhouette. No acute osseous abnormality. IMPRESSION: 1. Increased diffuse interstitial opacities, right-greater-than-left, which may represent pulmonary edema or atypical infection. 2. Cardiomegaly. Electronically Signed   By: Limin  Xu M.D.   On: 02/02/2024 16:45  EKG: EKG showed normal sinus rhythm heart rate 79 and nonspecific ST-T wave abnormality in lead II and aVF.    Assessment/Plan: Principal Problem:   Altered mental status Active Problems:   Acute cystitis   CAP (community acquired pneumonia)   Iron  deficiency anemia   Non-insulin dependent type 2 diabetes mellitus (HCC)   COPD (chronic obstructive pulmonary disease) (HCC)   History of CAD (coronary artery disease)   Chronic kidney disease (CKD), stage III (moderate) (HCC)   Essential hypertension    Assessment and Plan: Altered mental status-mostly agitation and confusion -Presented to emergency department with multiple complaints include headache throughout the day, poor appetite, generalized weakness.  Family at the bedside reported that patient's speech has been looking different.  While in the ED patient becomes gradually more agitated and confused required multiple doses of Ativan. - Head CT did not show any acute intracranial abnormality. - UA showed evidence of UTI and chest x-ray showed evidence of pneumonia. - Concern for acute metabolic encephalopathy/altered mental status in the setting of UTI and pneumonia. - Due to aggressive behavior and agitation unable to get the MRI tonight and eventually it has been canceled. - If patient's mental status does not improve need to obtain MRI without contrast to rule out CVA followed by MRI with and without contrast to rule out any intracranial abnormality. - checking ammonia and TSH level. -Continue IV Ativan and Haldol as needed - Continue delirium precaution. -Bedside nonviolent restraint has been implemented.  Acute cystitis -UA showing evidence of UTI.  Patient is afebrile.  Has leukocytosis 17.  Urine culture in process - Continue IV ceftriaxone  Community-acquired pneumonia -Chest x-ray evidence of pneumonia. - Patient is afebrile.  CBC showing leukocytosis. - Pending blood culture, sputum culture, urine Legionella, urine strep, respiratory panel, and procalcitonin level. -Continue IV ceftriaxone and doxycycline.  Essential hypertension Elevated blood pressure. - Continue home medication include hydrochlorothiazide and losartan.  Non-insulin-dependent DM type II -N.p.o.  in the setting of altered mental status and high risk for aspiration.  Continue to check POC blood glucose every 6 hour and hypoglycemia protocol on board.   History of COPD -Stable.  O2 sat 100% room air.  Continu breztri and DuoNeb as needed  CKD stage IIIa -Stable renal function.  Continue to monitor   DVT prophylaxis:  SQ Heparin Code Status:  Full Code Diet: Keeping patient n.p.o. in the setting of altered mental status as high risk of aspiration. Family Communication:   Family was present at bedside, at the time of interview. Opportunity was given to ask question and all questions were answered satisfactorily.  Disposition Plan: Continue monitor improvement of mental status. Consults: None indicated at this time Admission status:   Inpatient, Telemetry bed  Severity of Illness: The appropriate patient status for this patient is INPATIENT. Inpatient status is judged to be reasonable and necessary in order to provide the required intensity of service to ensure the patient's safety. The patient's presenting symptoms, physical exam findings, and initial radiographic and laboratory data in the context of their chronic comorbidities is felt to place them at high risk for further clinical deterioration. Furthermore, it is not anticipated that the patient will be medically stable for discharge from the hospital within 2 midnights of admission.   * I certify that at the point of admission it is my clinical judgment that the patient will require inpatient hospital care spanning beyond 2 midnights from the point of admission due to high intensity of service,  high risk for further deterioration and high frequency of surveillance required.DEWAINE    Harriett Azar, MD [facility]  How to contact the [facility] Attending or Consulting provider 7A - 7P or covering provider during after hours 7P -7A, for this patient.  Check the care team in [facility] and look for a) attending/consulting [facility] provider listed  and b) the [facility] team listed Log into [email or web address] and use [facility]'s universal password to access. If you do not have the password, please contact the hospital operator. Locate the [facility] provider you are looking for under [facility] and page to a number that you can be directly reached. If you still have difficulty reaching the provider, please page the [facility] (Director on Call) for the Hospitalists listed on amion for assistance.  02/03/2024, 2:39 AM

## 2024-02-03 NOTE — ED Notes (Signed)
 Pt placed in clean and dry brief

## 2024-02-03 NOTE — ED Notes (Signed)
 Patient resting comfortably at this time with equal chest rise and fall

## 2024-02-03 NOTE — ED Notes (Signed)
 Per Latina in staffing, tele sitter to be used until physical sitter is available. MD notified and aware.

## 2024-02-03 NOTE — Plan of Care (Signed)
 Patient seen and rounded on this morning in the ER.  Admitted after midnight, see H&P for full plan. 83 year old female presenting with altered mentation, agitation.  Symptoms began acutely on Monday.  She has had some reports of cough but no sputum. She was admitted for infectious workup.  Urinalysis barely consistent with UTI.  Small LE, negative nitrite, 11-20 WBC. CXR showed interstitial opacities right greater than left.  MRI brain was also ordered but patient was too agitated to accomplish.  CT head negative for acute findings.  Underlying white matter changes and right mastoid effusion.  She was started on Rocephin and doxycycline and admitted for further monitoring.  Plan - continue abx - Follow-up cultures - Obtain MRI brain if and when able - Continue sitter and restraints as needed -SLP eval - Every 4 hours sliding scale until adequate intake - start IVF if still not taking in much oral in next 24 hrs  Alm Apo, MD [facility] 02/03/2024, 12:31 PM

## 2024-02-03 NOTE — Inpatient Diabetes Management (Signed)
 Inpatient Diabetes Program Recommendations  AACE/ADA: New Consensus Statement on Inpatient Glycemic Control   Target Ranges:  Prepandial:   less than 140 mg/dL      Peak postprandial:   less than 180 mg/dL (1-2 hours)      Critically ill patients:  140 - 180 mg/dL    Latest Reference Range & Units 02/03/24 07:22  Glucose-Capillary 70 - 99 mg/dL 746 (H)    Latest Reference Range & Units 02/02/24 16:03 02/03/24 04:18  Glucose 70 - 99 mg/dL 797 (H) 734 (H)   Review of Glycemic Control  Diabetes history: DM2 Outpatient Diabetes medications: Amaryl 3 mg daily Current orders for Inpatient glycemic control: None; CBGs  Inpatient Diabetes Program Recommendations:    Insulin: Please consider ordering CBGs AC&HS and Novolog 0-9 units TID with meals and Novolog 0-5 units at bedtime.  Diet: Currently ordered Regular diet. If appropriate, may want to change diet to Carb Modified diet.  Thanks, Earnie Gainer, RN, MSN, CDCES Diabetes Coordinator Inpatient Diabetes Program [phone] (Team Pager from 8am to 5pm)

## 2024-02-03 NOTE — ED Notes (Signed)
 MD notified that non-violent wrist restraints applied

## 2024-02-03 NOTE — ED Notes (Signed)
 Per Latina in staffing, no additional sitters for safety observation at this time but sitter in yellow zone with other patient. This RN to follow up on sitter availability. At this time, patient is sleeping in no acute distress with equal chest rise and fall. MD notified.

## 2024-02-03 NOTE — ED Notes (Signed)
 Due to AMS, and pt attempting to remove her IV, Pt placed in soft mittens

## 2024-02-04 ENCOUNTER — Inpatient Hospital Stay (HOSPITAL_COMMUNITY)

## 2024-02-04 DIAGNOSIS — R4182 Altered mental status, unspecified: Secondary | ICD-10-CM | POA: Diagnosis not present

## 2024-02-04 LAB — GLUCOSE, CAPILLARY
Glucose-Capillary: 116 mg/dL — ABNORMAL HIGH (ref 70–99)
Glucose-Capillary: 123 mg/dL — ABNORMAL HIGH (ref 70–99)
Glucose-Capillary: 127 mg/dL — ABNORMAL HIGH (ref 70–99)
Glucose-Capillary: 142 mg/dL — ABNORMAL HIGH (ref 70–99)
Glucose-Capillary: 156 mg/dL — ABNORMAL HIGH (ref 70–99)

## 2024-02-04 LAB — CBC WITH DIFFERENTIAL/PLATELET
Abs Immature Granulocytes: 0.09 10*3/uL — ABNORMAL HIGH (ref 0.00–0.07)
Basophils Absolute: 0 10*3/uL (ref 0.0–0.1)
Basophils Relative: 0 %
Eosinophils Absolute: 0 10*3/uL (ref 0.0–0.5)
Eosinophils Relative: 0 %
HCT: 39.8 % (ref 36.0–46.0)
Hemoglobin: 11.8 g/dL — ABNORMAL LOW (ref 12.0–15.0)
Immature Granulocytes: 1 %
Lymphocytes Relative: 10 %
Lymphs Abs: 1.7 10*3/uL (ref 0.7–4.0)
MCH: 25.1 pg — ABNORMAL LOW (ref 26.0–34.0)
MCHC: 29.6 g/dL — ABNORMAL LOW (ref 30.0–36.0)
MCV: 84.5 fL (ref 80.0–100.0)
Monocytes Absolute: 1.3 10*3/uL — ABNORMAL HIGH (ref 0.1–1.0)
Monocytes Relative: 8 %
Neutro Abs: 14.6 10*3/uL — ABNORMAL HIGH (ref 1.7–7.7)
Neutrophils Relative %: 81 %
Platelets: 283 10*3/uL (ref 150–400)
RBC: 4.71 MIL/uL (ref 3.87–5.11)
RDW: 16.7 % — ABNORMAL HIGH (ref 11.5–15.5)
WBC: 17.8 10*3/uL — ABNORMAL HIGH (ref 4.0–10.5)
nRBC: 0 % (ref 0.0–0.2)

## 2024-02-04 LAB — MAGNESIUM: Magnesium: 2.1 mg/dL (ref 1.7–2.4)

## 2024-02-04 LAB — BASIC METABOLIC PANEL WITH GFR
Anion gap: 7 (ref 5–15)
BUN: 25 mg/dL — ABNORMAL HIGH (ref 8–23)
CO2: 25 mmol/L (ref 22–32)
Calcium: 8.4 mg/dL — ABNORMAL LOW (ref 8.9–10.3)
Chloride: 106 mmol/L (ref 98–111)
Creatinine, Ser: 0.9 mg/dL (ref 0.44–1.00)
GFR, Estimated: >60 mL/min (ref >60)
Glucose, Bld: 145 mg/dL — ABNORMAL HIGH (ref 70–99)
Potassium: 4.2 mmol/L (ref 3.5–5.1)
Sodium: 138 mmol/L (ref 135–145)

## 2024-02-04 LAB — BRAIN NATRIURETIC PEPTIDE: B Natriuretic Peptide: 43.6 pg/mL (ref 0.0–100.0)

## 2024-02-04 LAB — AMMONIA: Ammonia: 45 umol/L — ABNORMAL HIGH (ref 9–35)

## 2024-02-04 MED ORDER — ACETAMINOPHEN 325 MG PO TABS
650.0000 mg | ORAL_TABLET | Freq: Once | ORAL | Status: AC
  Administered 2024-02-04: 650 mg via ORAL
  Filled 2024-02-04: qty 2

## 2024-02-04 MED ORDER — LIDOCAINE 5 % EX PTCH
1.0000 | MEDICATED_PATCH | CUTANEOUS | Status: AC
  Administered 2024-02-04: 1 via TRANSDERMAL
  Filled 2024-02-04 (×2): qty 1

## 2024-02-04 NOTE — Plan of Care (Signed)
  Problem: Safety: Goal: Non-violent Restraint(s) Outcome: Progressing   Problem: Education: Goal: Ability to describe self-care measures that may prevent or decrease complications (Diabetes Survival Skills Education) will improve Outcome: Progressing Goal: Individualized Educational Video(s) Outcome: Progressing   Problem: Coping: Goal: Ability to adjust to condition or change in health will improve Outcome: Progressing   Problem: Fluid Volume: Goal: Ability to maintain a balanced intake and output will improve Outcome: Progressing   Problem: Health Behavior/Discharge Planning: Goal: Ability to identify and utilize available resources and services will improve Outcome: Progressing Goal: Ability to manage health-related needs will improve Outcome: Progressing   Problem: Metabolic: Goal: Ability to maintain appropriate glucose levels will improve Outcome: Progressing   Problem: Nutritional: Goal: Maintenance of adequate nutrition will improve Outcome: Progressing Goal: Progress toward achieving an optimal weight will improve Outcome: Progressing   Problem: Skin Integrity: Goal: Risk for impaired skin integrity will decrease Outcome: Progressing   Problem: Tissue Perfusion: Goal: Adequacy of tissue perfusion will improve Outcome: Progressing   Problem: Education: Goal: Knowledge of General Education information will improve Description: Including pain rating scale, medication(s)/side effects and non-pharmacologic comfort measures Outcome: Progressing   Problem: Health Behavior/Discharge Planning: Goal: Ability to manage health-related needs will improve Outcome: Progressing   Problem: Clinical Measurements: Goal: Ability to maintain clinical measurements within normal limits will improve Outcome: Progressing Goal: Will remain free from infection Outcome: Progressing Goal: Diagnostic test results will improve Outcome: Progressing Goal: Respiratory complications  will improve Outcome: Progressing Goal: Cardiovascular complication will be avoided Outcome: Progressing   Problem: Activity: Goal: Risk for activity intolerance will decrease Outcome: Progressing   Problem: Nutrition: Goal: Adequate nutrition will be maintained Outcome: Progressing   Problem: Coping: Goal: Level of anxiety will decrease Outcome: Progressing   Problem: Elimination: Goal: Will not experience complications related to bowel motility Outcome: Progressing Goal: Will not experience complications related to urinary retention Outcome: Progressing   Problem: Pain Managment: Goal: General experience of comfort will improve and/or be controlled Outcome: Progressing   Problem: Safety: Goal: Ability to remain free from injury will improve Outcome: Progressing   Problem: Skin Integrity: Goal: Risk for impaired skin integrity will decrease Outcome: Progressing   Problem: Activity: Goal: Ability to tolerate increased activity will improve Outcome: Progressing   Problem: Clinical Measurements: Goal: Ability to maintain a body temperature in the normal range will improve Outcome: Progressing   Problem: Respiratory: Goal: Ability to maintain adequate ventilation will improve Outcome: Progressing Goal: Ability to maintain a clear airway will improve Outcome: Progressing

## 2024-02-04 NOTE — Procedures (Signed)
 Modified Barium Swallow Study  Patient Details  Name: Christina Bennett MRN: [id number] Date of Birth: Jan 18, 1941  Today's Date: 02/04/2024  Modified Barium Swallow completed.  Full report located under Chart Review in the Imaging Section.  History of Present Illness Patient is an 83 y.o. female with PMH: CAD, COPD, essential HTN, CKD stage III, morbid obesity. She presented to the Ed on 02/03/24 with multiple complaints inclucding generalized weakeness, HA, poor appetite, chest congestion, blurry vision and family reporting change in her speech. ED evaluation found her to be hemodynamically stable but with BP up to 183/86, UA showed evidence of UTI. CT head negative for acute intracranial abnormality or hemorrhage but did show moderate periventricular WM changes likely reflecting the sequela of small vessel ischemia. CXR showed diffuse interstitial opacities, R>L. While in ED, patient became very restless, anxious and fidgety. Despite multiple doses of Ativan to attempt MRI,, it was canceled due to patient not cooperating. Non-violent restraints placed. SLP swallow evalauation ordered secondary to family reports of swallowing difficulities as well as RN observing patient to not be able to swallow even smaller pills, and coughing with liquids.   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 Swallow is initiated at level of pyriform sinus with thin and nectar thick liquids but incidents of thin liquids entering laryngeal vestibule before swallow initiated were also observed. Anterior hyoid excursion was partial in completion, and although epiglottic inversion was complete, laryngeal vestibule closure was incomplete and delayed. This led to thin liquid aspiration which was sensed with patient coughing every time (PAS 8) with 1/4 spoon size, 1/2 spoon size and small controlled cup size  sips. Chin tuck posture with small sips of thin liquids was effective to protect airway and prevent aspiration but small straw sip in chin tuck position was not effective to prevent aspiration. Nectar thick liquids from various sizes of cup sips were tolerated without aspiration but with penetration above the vocal cords (PAS 3). Mastication of solids was mildly prolonged and expected to be primarily due to patient's poor condition of dentiton. Masticated solids bolus and puree solids boluses both transited pharyngeally without delay or difficulty. Esophageal sweep revealed timely transit of barium tablet but with some barium in distal portion of esophagus. SLP is recommending Dys 3 (mechanical soft) solids and nectar thick liquids. SLP will plan to follow for toleration, as well as education and training of patient to consistently perform chin tuck with small sips of thin liquids. Factors that may increase risk of adverse event in presence of aspiration Noe & Lianne 2021): Frequent aspiration of large volumes  Swallow Evaluation Recommendations Recommendations: PO diet PO Diet Recommendation: Dysphagia 3 (Mechanical soft);Mildly thick liquids (Level 2, nectar thick) Liquid Administration via: Cup Medication Administration: Whole meds with puree Supervision: Patient able to self-feed;Intermittent supervision/cueing for swallowing strategies Swallowing strategies  : Slow rate;Small bites/sips Postural changes: Position pt fully upright for meals;Stay upright 30-60 min after meals Oral care recommendations: Oral care BID (2x/day)     Norleen IVAR Blase, MA, CCC-SLP Speech Therapy

## 2024-02-04 NOTE — Progress Notes (Signed)
 PROGRESS NOTE    Christina Bennett  FMW:[id number] DOB: April 09, 1941 DOA: 02/02/2024 PCP: Ransom Other, MD   Brief Narrative:  HPI:  Christina Bennett is a 83 y.o. female with medical history significant of CAD, COPD, essential hypertension, CKD stage III and morbid obesity presented to emergency department with multiple complaints include generalized weakness, headache, poor appetite, chest congestion, blurry vision and family reported patient has different speech.  Patient reported that she has headache throughout the day with poor oral intake.  Patient does not have any focal or generalized weakness and dysphagia.  Patient's niece at the bedside reported that at the baseline patient is independent and lives by herself.   Patient is reported that 1 of patient's friend lives nearby who noticed that patient is is complaining about headache throughout the day has a difficult speech and they thought patient might have a stroke which is why they called EMS. Patient initially reported that she has not eaten anything for almost 3 days and having severe headache with some blurry vision.  Exactly not sure what happened earlier today however patient reported she felt well few days ago.   Unable to get further history neither from the patient and niece at the bedside.     ED Course:  At presentation to ED patient found hemodynamically stable however blood pressure has been up to 183/86. BMP showed creatinine 1.05 and GFR 53.  Renal function at baseline. CBC showing leukocytosis 7.7, stable H&H normal platelet count. Troponin x 2 within normal range.   UA show evidence of UTI pending urine culture. EKG normal sinus rhythm, heart rate 7 9.  Nonspecific ST-T wave abnormality and lead II, aVF.   CT head no acute intracranial abnormality and hemorrhage.Moderate periventricular white matter changes likely reflecting the sequela of small vessel ischemia. 3. Right mastoid effusion. 4. Paranasal sinuses are  clear.   Chest x-ray showed increased diffusediffuse interstitial opacities, right-greater-than-left, which may represent pulmonary edema or atypical infection. 2. Cardiomegaly.   With the concern for pneumonia and UTI in the ED patient has been treated with ceftriaxone azithromycin.   While in the ED patient become very restless anxious and fidgety unable to obtain MRI required multiple doses of Ativan.  The MRI eventually has been canceled as patient is not cooperating.  Nonviolent restraint has been placed while in the ED post multiple doses of IV Ativan.   Hospitalist has been consulted for further management of altered mental status (agitation and confusion), community-acquired pneumonia and acute cystitis.  Assessment & Plan:   Principal Problem:   Altered mental status Active Problems:   Acute cystitis   CAP (community acquired pneumonia)   Iron deficiency anemia   Non-insulin dependent type 2 diabetes mellitus (HCC)   COPD (chronic obstructive pulmonary disease) (HCC)   History of CAD (coronary artery disease)   Chronic kidney disease (CKD), stage III (moderate) (HCC)   Essential hypertension  Altered mental status-mostly agitation and confusion Family was concerned about change in his speech.  While in the ED patient becomes gradually more agitated and confused required multiple doses of Ativan.  CT head unremarkable.  Patient was presumed to have UTI as well as pneumonia based on the chest x-ray.  Started on antibiotics.  Also concern for CVA and plan was to get MRI when she is more alert and oriented.  Patient is fully alert and oriented.  She has no complaints.  She is denying any cough or shortness of breath.  She is denying  any urinary complaints and UA does not appear to be impressive enough either.  Patient has chronic mild leukocytosis and she is afebrile.  Procalcitonin unremarkable.  I doubt pneumonia.  Instead chest x-ray may be showing pulmonary edema.  Last echo in  2022 shows normal ejection fraction.  I will check BNP and if elevated, will proceed with transthoracic echo and possibly diuretics.  I will discontinue antibiotics.  Now that she is fully alert and oriented and is following commands, I highly doubt stroke, will not pursue MRI.  Acute hypoxic respiratory failure secondary to presumed pulmonary edema: Chest x-ray with pulmonary edema.  Checking BNP.  Based on that, may start on diuretics.   Acute cystitis and community-acquired pneumonia ruled out/bacteriuria ruled in. UA not impressive for UTI.  Discontinuing antibiotics.   Essential hypertension: Blood pressure controlled.  Continue home medications which include hydrochlorothiazide and losartan   Non-insulin-dependent DM type II Patient n.p.o. due to agitation and altered mental status.  Hemoglobin A1c 9.2.  PTA medications glimepiride only which is on hold.  She is complaining of some irritation in the throat.  Nursing was concerned about swallowing difficulty.  SLP consulted, patient is going down for swallowing study.  Blood sugar controlled on SSI.   History of COPD -Stable. Continu breztri and DuoNeb as needed   CKD stage IIIa -Stable renal function.  Continue to monitor  DVT prophylaxis: heparin injection 5,000 Units Start: 02/03/24 0600 SCDs Start: 02/03/24 0132 Place TED hose Start: 02/03/24 0132   Code Status: Full Code  Family Communication:  None present at bedside.  Plan of care discussed with patient in length and he/she verbalized understanding and agreed with it.  Status is: Inpatient Remains inpatient appropriate because: Needs further workup.   Estimated body mass index is 36.13 kg/m as calculated from the following:   Height as of this encounter: 5' 2 (1.575 m).   Weight as of this encounter: 89.6 kg.    Nutritional Assessment: Body mass index is 36.13 kg/m.SABRA Seen by dietician.  I agree with the assessment and plan as outlined below: Nutrition Status:         . Skin Assessment: I have examined the patient's skin and I agree with the wound assessment as performed by the wound care RN as outlined below:    Consultants:  None  Procedures:  None none  Antimicrobials:  Anti-infectives (From admission, onward)    Start     Dose/Rate Route Frequency Ordered Stop   02/03/24 2200  cefTRIAXone (ROCEPHIN) 1 g in sodium chloride  0.9 % 100 mL IVPB        1 g 200 mL/hr over 30 Minutes Intravenous Every 24 hours 02/03/24 0131 02/10/24 2159   02/03/24 0300  doxycycline (VIBRAMYCIN) 100 mg in sodium chloride  0.9 % 250 mL IVPB        100 mg 125 mL/hr over 120 Minutes Intravenous Every 12 hours 02/03/24 0131 02/10/24 0259   02/03/24 0115  azithromycin (ZITHROMAX) 500 mg in sodium chloride  0.9 % 250 mL IVPB  Status:  Discontinued        500 mg 250 mL/hr over 60 Minutes Intravenous  Once 02/03/24 0107 02/03/24 0135   02/02/24 2215  cefTRIAXone (ROCEPHIN) 1 g in sodium chloride  0.9 % 100 mL IVPB        1 g 200 mL/hr over 30 Minutes Intravenous  Once 02/02/24 2208 02/02/24 2305         Subjective: Patient seen and examined, patient's RN at the  bedside.  Patient is fully alert and oriented.  Other than difficulty swallowing and some irritation in the throat, no other complaint.  Objective: Vitals:   02/04/24 0024 02/04/24 0444 02/04/24 0759 02/04/24 0821  BP: (!) 164/66 (!) 141/51  (!) 152/74  Pulse: 86 69  87  Resp: 17 18  18   Temp: 97.7 F (36.5 C) 98.1 F (36.7 C)  97.8 F (36.6 C)  TempSrc: Oral     SpO2: 97% 99% 99% 96%  Weight:      Height:        Intake/Output Summary (Last 24 hours) at 02/04/2024 1030 Last data filed at 02/04/2024 0850 Gross per 24 hour  Intake 825 ml  Output 550 ml  Net 275 ml   Filed Weights   02/03/24 1519  Weight: 89.6 kg    Examination:  General exam: Appears calm and comfortable  Respiratory system: Bibasilar crackles. Respiratory effort normal. Cardiovascular system: S1 & S2 heard, RRR. No  JVD, murmurs, rubs, gallops or clicks. No pedal edema. Gastrointestinal system: Abdomen is nondistended, soft and nontender. No organomegaly or masses felt. Normal bowel sounds heard. Central nervous system: Alert and oriented. No focal neurological deficits. Extremities: Symmetric 5 x 5 power. Skin: No rashes, lesions or ulcers Psychiatry: Judgement and insight appear normal. Mood & affect appropriate.    Data Reviewed: I have personally reviewed following labs and imaging studies  CBC: Recent Labs  Lab 02/02/24 1603 02/03/24 0418 02/04/24 0312  WBC 17.7* 15.8* 17.8*  NEUTROABS  --   --  14.6*  HGB 12.9 12.8 11.8*  HCT 43.5 43.4 39.8  MCV 85.8 85.3 84.5  PLT 289 266 283   Basic Metabolic Panel: Recent Labs  Lab 02/02/24 1603 02/03/24 0418 02/04/24 0312  NA 138 135 138  K 4.8 4.5 4.2  CL 101 103 106  CO2 22 22 25   GLUCOSE 202* 265* 145*  BUN 17 19 25*  CREATININE 1.05* 1.08* 0.90  CALCIUM 9.1 8.6* 8.4*  MG  --   --  2.1   GFR: Estimated Creatinine Clearance: 51 mL/min (by C-G formula based on SCr of 0.9 mg/dL). Liver Function Tests: Recent Labs  Lab 02/03/24 0418  AST 14*  ALT 8  ALKPHOS 98  BILITOT 0.6  PROT 6.9  ALBUMIN 3.2*   No results for input(s): LIPASE, AMYLASE in the last 168 hours. Recent Labs  Lab 02/04/24 0312  AMMONIA 45*   Coagulation Profile: No results for input(s): INR, PROTIME in the last 168 hours. Cardiac Enzymes: No results for input(s): CKTOTAL, CKMB, CKMBINDEX, TROPONINI in the last 168 hours. BNP (last 3 results) No results for input(s): PROBNP in the last 8760 hours. HbA1C: Recent Labs    02/03/24 0418  HGBA1C 9.2*   CBG: Recent Labs  Lab 02/03/24 1635 02/03/24 1924 02/04/24 0048 02/04/24 0447 02/04/24 0822  GLUCAP 178* 145* 142* 156* 123*   Lipid Profile: No results for input(s): CHOL, HDL, LDLCALC, TRIG, CHOLHDL, LDLDIRECT in the last 72 hours. Thyroid  Function Tests: Recent  Labs    02/03/24 0410  TSH 1.017   Anemia Panel: No results for input(s): VITAMINB12, FOLATE, FERRITIN, TIBC, IRON, RETICCTPCT in the last 72 hours. Sepsis Labs: Recent Labs  Lab 02/03/24 0418  PROCALCITON <0.10    Recent Results (from the past 240 hours)  Urine Culture     Status: Abnormal   Collection Time: 02/02/24  9:11 PM   Specimen: Urine, Random  Result Value Ref Range Status   Specimen Description  URINE, RANDOM  Final   Special Requests   Final    NONE Reflexed from [id number] Performed at [facility], [address], [place], [name] [address]    Culture MULTIPLE SPECIES PRESENT, SUGGEST RECOLLECTION (A)  Final   Report Status 02/03/2024 FINAL  Final  Respiratory (~20 pathogens) panel by PCR     Status: None   Collection Time: 02/03/24  1:37 AM   Specimen: Nasopharyngeal Swab; Respiratory  Result Value Ref Range Status   Adenovirus NOT DETECTED NOT DETECTED Final   Coronavirus 229E NOT DETECTED NOT DETECTED Final    Comment: (NOTE) The Coronavirus on the Respiratory Panel, DOES NOT test for the novel  Coronavirus (2019 nCoV)    Coronavirus HKU1 NOT DETECTED NOT DETECTED Final   Coronavirus NL63 NOT DETECTED NOT DETECTED Final   Coronavirus OC43 NOT DETECTED NOT DETECTED Final   Metapneumovirus NOT DETECTED NOT DETECTED Final   Rhinovirus / Enterovirus NOT DETECTED NOT DETECTED Final   Influenza A NOT DETECTED NOT DETECTED Final   Influenza B NOT DETECTED NOT DETECTED Final   Parainfluenza Virus 1 NOT DETECTED NOT DETECTED Final   Parainfluenza Virus 2 NOT DETECTED NOT DETECTED Final   Parainfluenza Virus 3 NOT DETECTED NOT DETECTED Final   Parainfluenza Virus 4 NOT DETECTED NOT DETECTED Final   Respiratory Syncytial Virus NOT DETECTED NOT DETECTED Final   Bordetella pertussis NOT DETECTED NOT DETECTED Final   Bordetella Parapertussis NOT DETECTED NOT DETECTED Final   Chlamydophila pneumoniae NOT DETECTED NOT DETECTED Final   Mycoplasma  pneumoniae NOT DETECTED NOT DETECTED Final    Comment: Performed at [facility], [address] [place], [name] [address]  Culture, blood (routine x 2) Call MD if unable to obtain prior to antibiotics being given     Status: None (Preliminary result)   Collection Time: 02/03/24  4:10 AM   Specimen: BLOOD  Result Value Ref Range Status   Specimen Description BLOOD SITE NOT SPECIFIED  Final   Special Requests   Final    BOTTLES DRAWN AEROBIC AND ANAEROBIC Blood Culture adequate volume   Culture   Final    NO GROWTH 1 DAY Performed at [facility], [address], [place], [name] [address]    Report Status PENDING  Incomplete  Culture, blood (routine x 2) Call MD if unable to obtain prior to antibiotics being given     Status: None (Preliminary result)   Collection Time: 02/03/24  4:18 AM   Specimen: BLOOD  Result Value Ref Range Status   Specimen Description BLOOD SITE NOT SPECIFIED  Final   Special Requests   Final    BOTTLES DRAWN AEROBIC AND ANAEROBIC Blood Culture adequate volume   Culture   Final    NO GROWTH 1 DAY Performed at [facility], [address], [place], [name] [address]    Report Status PENDING  Incomplete     Radiology Studies: CT Head Wo Contrast Result Date: 02/02/2024 CLINICAL DATA:  Sinusitis, acute, orbital or intracranial complications suspected headache, paresthesias EXAM: CT HEAD WITHOUT CONTRAST TECHNIQUE: Contiguous axial images were obtained from the base of the skull through the vertex without intravenous contrast. RADIATION DOSE REDUCTION: This exam was performed according to the departmental dose-optimization program which includes automated exposure control, adjustment of the mA and/or kV according to patient size and/or use of iterative reconstruction technique. COMPARISON:  None Available. FINDINGS: Brain: Normal anatomic configuration. Parenchymal volume loss is commensurate with the  patient's age. Moderate periventricular white  matter changes are present likely reflecting the sequela of small vessel ischemia. No abnormal intra or extra-axial mass lesion or fluid collection. No abnormal mass effect or midline shift. No evidence of acute intracranial hemorrhage or infarct. Ventricular size is normal. Cerebellum unremarkable. Vascular: No asymmetric hyperdense vasculature at the skull base. Skull: Intact Sinuses/Orbits: Paranasal sinuses are clear. Orbits are unremarkable. Other: Fluid opacification of several right mastoid air cells without associated osseous erosion. Left mastoid air cells and middle ear cavities are clear. IMPRESSION: 1. No evidence of acute intracranial hemorrhage or infarct. 2. Moderate periventricular white matter changes likely reflecting the sequela of small vessel ischemia. 3. Right mastoid effusion. 4. Paranasal sinuses are clear. Electronically Signed   By: Dorethia Molt M.D.   On: 02/02/2024 21:01   DG Chest 2 View Result Date: 02/02/2024 CLINICAL DATA:  Chest pain and shortness of breath EXAM: CHEST - 2 VIEW COMPARISON:  Chest radiograph dated 10/03/2020 FINDINGS: Patient is rotated to the right. Low lung volumes with bronchovascular crowding. Increased diffuse interstitial opacities, right-greater-than-left. No pleural effusion or pneumothorax. Enlarged cardiomediastinal silhouette. No acute osseous abnormality. IMPRESSION: 1. Increased diffuse interstitial opacities, right-greater-than-left, which may represent pulmonary edema or atypical infection. 2. Cardiomegaly. Electronically Signed   By: Limin  Xu M.D.   On: 02/02/2024 16:45    Scheduled Meds:  budesonide-glycopyrrolate-formoterol  2 puff Inhalation BID   heparin  5,000 Units Subcutaneous Q8H   hydrochlorothiazide  25 mg Oral Daily   insulin aspart  0-9 Units Subcutaneous Q4H   losartan  100 mg Oral Daily   montelukast  10 mg Oral QPM   pantoprazole  40 mg Oral Daily   simvastatin  40 mg Oral QPM   sodium chloride  flush  3 mL Intravenous  Q12H   Continuous Infusions:  cefTRIAXone (ROCEPHIN)  IV 1 g (02/03/24 2251)   doxycycline (VIBRAMYCIN) IV 100 mg (02/04/24 0229)     LOS: 1 day   Fredia Skeeter, MD [facility]  02/04/2024, 10:30 AM   *Please note that this is a verbal dictation therefore any spelling or grammatical errors are due to the Dragon Medical One system interpretation.  Please page via Amion and do not message via secure chat for urgent patient care matters. Secure chat can be used for non urgent patient care matters.  How to contact the [facility] Attending or Consulting provider 7A - 7P or covering provider during after hours 7P -7A, for this patient?  Check the care team in [facility] and look for a) attending/consulting [facility] provider listed and b) the [facility] team listed. Page or secure chat 7A-7P. Log into [email or web address] and use [facility] universal password to access. If you do not have the password, please contact the hospital operator. Locate the [facility] provider you are looking for under [facility] and page to a number that you can be directly reached. If you still have difficulty reaching the provider, please page the [facility] (Director on Call) for the Hospitalists listed on amion for assistance.

## 2024-02-05 ENCOUNTER — Inpatient Hospital Stay (HOSPITAL_COMMUNITY)

## 2024-02-05 DIAGNOSIS — E1165 Type 2 diabetes mellitus with hyperglycemia: Secondary | ICD-10-CM | POA: Diagnosis not present

## 2024-02-05 DIAGNOSIS — I6389 Other cerebral infarction: Secondary | ICD-10-CM | POA: Diagnosis not present

## 2024-02-05 DIAGNOSIS — I129 Hypertensive chronic kidney disease with stage 1 through stage 4 chronic kidney disease, or unspecified chronic kidney disease: Secondary | ICD-10-CM

## 2024-02-05 DIAGNOSIS — R531 Weakness: Secondary | ICD-10-CM

## 2024-02-05 DIAGNOSIS — R29709 NIHSS score 9: Secondary | ICD-10-CM | POA: Diagnosis not present

## 2024-02-05 DIAGNOSIS — E1122 Type 2 diabetes mellitus with diabetic chronic kidney disease: Secondary | ICD-10-CM

## 2024-02-05 DIAGNOSIS — I639 Cerebral infarction, unspecified: Secondary | ICD-10-CM | POA: Diagnosis not present

## 2024-02-05 DIAGNOSIS — J449 Chronic obstructive pulmonary disease, unspecified: Secondary | ICD-10-CM | POA: Diagnosis not present

## 2024-02-05 LAB — ECHOCARDIOGRAM COMPLETE
Area-P 1/2: 5.38 cm2
Height: 62 in
Weight: 3160.51 [oz_av]

## 2024-02-05 LAB — CBC WITH DIFFERENTIAL/PLATELET
Abs Immature Granulocytes: 0.06 10*3/uL (ref 0.00–0.07)
Basophils Absolute: 0.1 10*3/uL (ref 0.0–0.1)
Basophils Relative: 0 %
Eosinophils Absolute: 0.1 10*3/uL (ref 0.0–0.5)
Eosinophils Relative: 1 %
HCT: 41 % (ref 36.0–46.0)
Hemoglobin: 12.2 g/dL (ref 12.0–15.0)
Immature Granulocytes: 0 %
Lymphocytes Relative: 16 %
Lymphs Abs: 2.2 10*3/uL (ref 0.7–4.0)
MCH: 25.4 pg — ABNORMAL LOW (ref 26.0–34.0)
MCHC: 29.8 g/dL — ABNORMAL LOW (ref 30.0–36.0)
MCV: 85.2 fL (ref 80.0–100.0)
Monocytes Absolute: 1.2 10*3/uL — ABNORMAL HIGH (ref 0.1–1.0)
Monocytes Relative: 9 %
Neutro Abs: 10.1 10*3/uL — ABNORMAL HIGH (ref 1.7–7.7)
Neutrophils Relative %: 74 %
Platelets: 270 10*3/uL (ref 150–400)
RBC: 4.81 MIL/uL (ref 3.87–5.11)
RDW: 17.2 % — ABNORMAL HIGH (ref 11.5–15.5)
WBC: 13.6 10*3/uL — ABNORMAL HIGH (ref 4.0–10.5)
nRBC: 0 % (ref 0.0–0.2)

## 2024-02-05 LAB — BASIC METABOLIC PANEL WITH GFR
Anion gap: 11 (ref 5–15)
BUN: 23 mg/dL (ref 8–23)
CO2: 21 mmol/L — ABNORMAL LOW (ref 22–32)
Calcium: 8.9 mg/dL (ref 8.9–10.3)
Chloride: 106 mmol/L (ref 98–111)
Creatinine, Ser: 0.96 mg/dL (ref 0.44–1.00)
GFR, Estimated: 59 mL/min — ABNORMAL LOW (ref >60)
Glucose, Bld: 123 mg/dL — ABNORMAL HIGH (ref 70–99)
Potassium: 3.9 mmol/L (ref 3.5–5.1)
Sodium: 138 mmol/L (ref 135–145)

## 2024-02-05 LAB — LIPID PANEL
Cholesterol: 178 mg/dL (ref 0–200)
HDL: 31 mg/dL — ABNORMAL LOW (ref >40)
LDL Cholesterol: 100 mg/dL — ABNORMAL HIGH (ref 0–99)
Total CHOL/HDL Ratio: 5.7 ratio
Triglycerides: 233 mg/dL — ABNORMAL HIGH (ref <150)
VLDL: 47 mg/dL — ABNORMAL HIGH (ref 0–40)

## 2024-02-05 LAB — GLUCOSE, CAPILLARY
Glucose-Capillary: 141 mg/dL — ABNORMAL HIGH (ref 70–99)
Glucose-Capillary: 149 mg/dL — ABNORMAL HIGH (ref 70–99)
Glucose-Capillary: 130 mg/dL — ABNORMAL HIGH (ref 70–99)
Glucose-Capillary: 134 mg/dL — ABNORMAL HIGH (ref 70–99)
Glucose-Capillary: 129 mg/dL — ABNORMAL HIGH (ref 70–99)
Glucose-Capillary: 118 mg/dL — ABNORMAL HIGH (ref 70–99)
Glucose-Capillary: 124 mg/dL — ABNORMAL HIGH (ref 70–99)

## 2024-02-05 LAB — MAGNESIUM: Magnesium: 2 mg/dL (ref 1.7–2.4)

## 2024-02-05 LAB — MRSA NEXT GEN BY PCR, NASAL: MRSA by PCR Next Gen: NOT DETECTED

## 2024-02-05 MED ORDER — PERFLUTREN LIPID MICROSPHERE
1.0000 mL | INTRAVENOUS | Status: AC | PRN
  Administered 2024-02-05: 5 mL via INTRAVENOUS

## 2024-02-05 MED ORDER — HYDRALAZINE HCL 20 MG/ML IJ SOLN
10.0000 mg | Freq: Four times a day (QID) | INTRAMUSCULAR | Status: DC | PRN
  Administered 2024-02-12: 10 mg via INTRAVENOUS
  Filled 2024-02-05: qty 1

## 2024-02-05 MED ORDER — IOHEXOL 350 MG/ML SOLN
75.0000 mL | Freq: Once | INTRAVENOUS | Status: AC | PRN
  Administered 2024-02-05: 75 mL via INTRAVENOUS

## 2024-02-05 MED ORDER — ATORVASTATIN CALCIUM 40 MG PO TABS
40.0000 mg | ORAL_TABLET | Freq: Every day | ORAL | Status: DC
  Administered 2024-02-05 – 2024-02-25 (×21): 40 mg via ORAL
  Filled 2024-02-05 (×21): qty 1

## 2024-02-05 MED ORDER — CHLORHEXIDINE GLUCONATE CLOTH 2 % EX PADS
6.0000 | MEDICATED_PAD | Freq: Every day | CUTANEOUS | Status: DC
  Administered 2024-02-05 – 2024-02-24 (×12): 6 via TOPICAL

## 2024-02-05 MED ORDER — LABETALOL HCL 5 MG/ML IV SOLN
10.0000 mg | INTRAVENOUS | Status: DC | PRN
  Administered 2024-02-11: 10 mg via INTRAVENOUS
  Filled 2024-02-05: qty 4

## 2024-02-05 MED ORDER — SODIUM CHLORIDE 0.9% FLUSH: 3.0000 mL | INTRAVENOUS | Status: DC | PRN

## 2024-02-05 MED ORDER — CLEVIDIPINE BUTYRATE 0.5 MG/ML IV EMUL
0.0000 mg/h | INTRAVENOUS | Status: DC
  Filled 2024-02-05: qty 100

## 2024-02-05 MED ORDER — TENECTEPLASE FOR STROKE
0.2500 mg/kg | PACK | Freq: Once | INTRAVENOUS | Status: AC
  Administered 2024-02-05: 22 mg via INTRAVENOUS
  Filled 2024-02-05: qty 10

## 2024-02-05 MED ORDER — STROKE: EARLY STAGES OF RECOVERY BOOK
Freq: Once | Status: AC
  Filled 2024-02-05: qty 1

## 2024-02-05 MED ORDER — SODIUM CHLORIDE 0.9% FLUSH
3.0000 mL | Freq: Two times a day (BID) | INTRAVENOUS | Status: DC
  Administered 2024-02-05: 10 mL via INTRAVENOUS
  Administered 2024-02-05: 3 mL via INTRAVENOUS
  Administered 2024-02-05: 10 mL via INTRAVENOUS
  Administered 2024-02-06: 3 mL via INTRAVENOUS
  Administered 2024-02-07: 10 mL via INTRAVENOUS
  Administered 2024-02-08 – 2024-02-11 (×4): 3 mL via INTRAVENOUS
  Administered 2024-02-12 – 2024-02-14 (×3): 10 mL via INTRAVENOUS
  Administered 2024-02-14: 3 mL via INTRAVENOUS
  Administered 2024-02-15 – 2024-02-16 (×2): 10 mL via INTRAVENOUS
  Administered 2024-02-16: 3 mL via INTRAVENOUS
  Administered 2024-02-17 – 2024-02-19 (×5): 10 mL via INTRAVENOUS
  Administered 2024-02-19: 3 mL via INTRAVENOUS
  Administered 2024-02-21: 10 mL via INTRAVENOUS
  Administered 2024-02-21: 3 mL via INTRAVENOUS
  Administered 2024-02-22: 10 mL via INTRAVENOUS
  Administered 2024-02-23: 3 mL via INTRAVENOUS
  Administered 2024-02-23: 10 mL via INTRAVENOUS
  Administered 2024-02-24: 3 mL via INTRAVENOUS

## 2024-02-05 MED ORDER — SODIUM CHLORIDE 0.9 % IV SOLN: INTRAVENOUS | Status: AC

## 2024-02-05 MED ORDER — ACETAMINOPHEN 325 MG PO TABS
650.0000 mg | ORAL_TABLET | Freq: Four times a day (QID) | ORAL | Status: DC | PRN
  Administered 2024-02-05 – 2024-02-06 (×2): 650 mg via ORAL
  Filled 2024-02-05 (×2): qty 2

## 2024-02-05 MED ORDER — PANTOPRAZOLE SODIUM 40 MG IV SOLR
40.0000 mg | Freq: Every day | INTRAVENOUS | Status: DC
  Administered 2024-02-05: 40 mg via INTRAVENOUS
  Filled 2024-02-05: qty 10

## 2024-02-05 NOTE — Plan of Care (Signed)
  Problem: Ischemic Stroke/TIA Tissue Perfusion: Goal: Complications of ischemic stroke/TIA will be minimized 02/05/2024 1542 by Orlando Aleck ORN, RN Outcome: Progressing 02/05/2024 0736 by Orlando Aleck ORN, RN Outcome: Progressing   Problem: Education: Goal: Knowledge of secondary prevention will improve (MUST DOCUMENT ALL) 02/05/2024 1542 by Orlando Aleck ORN, RN Outcome: Progressing 02/05/2024 0736 by Orlando Aleck ORN, RN Outcome: Progressing   Problem: Coping: Goal: Will identify appropriate support needs 02/05/2024 1542 by Orlando Aleck ORN, RN Outcome: Progressing 02/05/2024 0736 by Orlando Aleck ORN, RN Outcome: Progressing   Problem: Coping: Goal: Will verbalize positive feelings about self 02/05/2024 1542 by Orlando Aleck ORN, RN Outcome: Progressing 02/05/2024 0736 by Orlando Aleck ORN, RN Outcome: Progressing   Problem: Nutrition: Goal: Dietary intake will improve 02/05/2024 1542 by Orlando Aleck ORN, RN Outcome: Progressing 02/05/2024 0736 by Orlando Aleck ORN, RN Outcome: Progressing

## 2024-02-05 NOTE — Evaluation (Signed)
 Modified Barium Swallow Study  Patient Details  Name: Christina Bennett MRN: [id number] Date of Birth: 10-21-1940  Today's Date: 02/05/2024  Modified Barium Swallow completed.  Full report located under Chart Review in the Imaging Section.  History of Present Illness Patient is an 83 y.o. female with PMH: CAD, COPD, essential HTN, CKD stage III, morbid obesity. She presented to the Ed on 02/03/24 with multiple complaints inclucding generalized weakeness, HA, poor appetite, chest congestion, blurry vision and family reporting change in her speech. ED evaluation found her to be hemodynamically stable but with BP up to 183/86, UA showed evidence of UTI. CT head negative for acute intracranial abnormality or hemorrhage but did show moderate periventricular WM changes likely reflecting the sequela of small vessel ischemia. CXR showed diffuse interstitial opacities, R>L. While in ED, patient became very restless, anxious and fidgety. Despite multiple doses of Ativan to attempt MRI,, it was canceled due to patient not cooperating. Non-violent restraints placed. SLP swallow evalauation ordered secondary to family reports of swallowing difficulities as well as RN observing patient to not be able to swallow even smaller pills, and coughing with liquids.  MBSS 6/25 with recs for D3/NTL. Pt with code stroke overnight and reassessed 6/26 with change in swallow function.   Clinical Impression Pt has had a change in oral and pharyngeal function since initial MBS on previous date. She has slower lingual transit and mastication, with less lingual control and more oral residue, although residue is mild. She has difficulty getting thickened liquids up via straw. New pharyngeal impairments also include reduced base of tongue retraction and pharyngeal squeeze. Laryngeal vestibule closure and epiglottic inversion are incomplete, although thicker boluses help invert the epiglottis a little more. These pharyngeal deficits,  combined with more sluggish oral phase, result in boluses dumping into the larynx and pharynx before the swallow, with aspiration of thin and nectar thick liquids that is sensed but not cleared (PAS 7). This includes gross aspiration from one small cup sip of nectar thick liquids, that elicited persistent coughing from the pt throughout the remaining duration of the study. Pt had no aspiration with honey thick liquids or solids, but given this persistent coughing, it was hard to challenge her to take larger volumes at a time. Recommend that she resume a PO diet with assistance during intake, starting with Dys 2 (finely chopped) diet and honey thick liquids by cup.  DIGEST Swallow Severity Rating*  Safety: 4  Efficiency: 1  Overall Pharyngeal Swallow Severity: 3 1: mild; 2: moderate; 3: severe; 4: profound  *The Dynamic Imaging Grade of Swallowing Toxicity is standardized for the head and neck cancer population, however, demonstrates promising clinical applications across populations to standardize the clinical rating of pharyngeal swallow safety and severity.   Factors that may increase risk of adverse event in presence of aspiration Christina Bennett & Christina Bennett 2021): Frequent aspiration of large volumes;Aspiration of thick, dense, and/or acidic materials;Weak cough;Reduced cognitive function;Dependence for feeding and/or oral hygiene  Swallow Evaluation Recommendations Recommendations: PO diet PO Diet Recommendation: Dysphagia 2 (Finely chopped);Moderately thick liquids (Level 3, honey thick) Liquid Administration via: Cup;Spoon Medication Administration: Crushed with puree Supervision: Staff to assist with self-feeding;Full supervision/cueing for swallowing strategies Swallowing strategies  : Slow rate;Small bites/sips Postural changes: Position pt fully upright for meals;Stay upright 30-60 min after meals Oral care recommendations: Oral care BID (2x/day) Caregiver Recommendations: Avoid jello, ice  cream, thin soups, popsicles;Remove water pitcher;Have oral suction available      Christina Bennett., M.A. CCC-SLP Acute Rehabilitation  Services Office: [phone]  Secure chat preferred  02/05/2024,3:56 PM

## 2024-02-05 NOTE — Evaluation (Signed)
 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 Nuclear study normal in 2011    COPD (chronic obstructive pulmonary disease) (HCC)    Diabetes (HCC)    Diabetes type 2- 1/13- metformin started   Dyslipidemia    Endometriosis    with ovarian radiation in 1966, gyn Dr cousins   GERD (gastroesophageal reflux disease)    H/H   Herpes    herpes of the left eye, Dr gust optho   History of DVT (deep vein thrombosis)    on OCP   History of kidney stones    Hypertension    Mild anemia    WBC mild high, lab 2012/ HEMATOLOGY    Osteoarthritis    Osteoarthritis of the hip, knee, and hand, vicodin , prn   Osteopenia    BD in 2013   Tobacco use    quit 03/2008   Vitamin D deficiency    Past Surgical History:  Past Surgical History:  Procedure Laterality Date   APPENDECTOMY     BALLOON DILATION N/A 07/09/2016   Procedure: BALLOON  DILATION;  Surgeon: Gladis MARLA Louder, MD;  Location: [facility];  Service: Endoscopy;  Laterality: N/A;   COLONOSCOPY WITH PROPOFOL  N/A 07/09/2016   Procedure: COLONOSCOPY WITH PROPOFOL ;  Surgeon: Gladis MARLA Louder, MD;  Location: [facility];  Service: Endoscopy;  Laterality: N/A;   ESOPHAGOGASTRODUODENOSCOPY N/A 07/09/2016   Procedure: ESOPHAGOGASTRODUODENOSCOPY (EGD);  Surgeon: Gladis MARLA Louder, MD;  Location: [facility];  Service: Endoscopy;  Laterality: N/A;   EYE SURGERY     left eye cataract srugery    HERNIA REPAIR     surgery fro endometriosis     TONSILLECTOMY     HPI:  Patient is an 83 y.o. female with PMH: CAD, COPD, essential HTN, CKD stage III, morbid obesity. She presented to the Ed on 02/03/24 with multiple complaints inclucding generalized weakeness, HA, poor appetite, chest congestion, blurry vision and family reporting change in her speech. ED evaluation found her to be hemodynamically stable but with BP up to 183/86, UA showed evidence of UTI. CT head negative for acute intracranial abnormality or hemorrhage but did show moderate periventricular WM changes likely reflecting the sequela of small vessel ischemia. CXR showed diffuse interstitial opacities, R>L. While in ED, patient became very restless, anxious and fidgety. Despite multiple doses of Ativan to attempt MRI,, it was canceled due to patient not cooperating. Non-violent restraints placed. SLP swallow evalauation ordered secondary to family reports of  swallowing difficulities as well as RN observing patient to not be able to swallow even smaller pills, and coughing with liquids.  MBSS 6/25 with recs for D3/NTL. Pt with code stroke overnight and reassessed 6/26 with change in swallow function.   Assessment / Plan / Recommendation Clinical Impression  Pt presents with moderate cognitive linguistic deficits.  Pt was assessed using the SLUMS and scored 11 of 30 suggestive of a major neurocognitive impairment.  She was  partially oriented to tiem.  She exhibited short term recall deficits with word recall more difficult (1 of 5, and only 3 of 5 on immediate repetition) than narrative recall (4 of 8).  She was able to generate 11 items in a category within 1 minute. She was unable to compelte backward digit span past 2, or complete calculations.  Pt did not attempt to complete clock drawing.  Her LUE has been weak since code stroke called, but pt is R handed.  She was unable to lift pen to paper today.  Recommend therapy to address the above noted deficits.  Pt will likely need continued ST at next level of care.  Unclear what pt's baseline level of function is, but at present she would require frequent supervision after discharge.  Pt exhibited intermittent dysarthria which is suspected to be at least in part related lethargy/drowsiness.  OME with R sided oral/facial deficits, contralateral to LUE weakness.  Pt exhibited adequate word finding in limited conversation.  RN states pt has been able to communicate needs/wants independently.  Her divergent naming task was an area of relative strength on SLUMS today.      SLP Assessment  SLP Recommendation/Assessment: Patient needs continued Speech Language Pathology Services SLP Visit Diagnosis: Cognitive communication deficit (R41.841)     Assistance Recommended at Discharge   Frequent/constant supervision  Functional Status Assessment Patient has had a recent decline in their functional status and demonstrates the ability to make significant improvements in function in a reasonable and predictable amount of time.  Frequency and Duration min 2x/week  2 weeks      SLP Evaluation Cognition  Overall Cognitive Status: No family/caregiver present to determine baseline cognitive functioning Arousal/Alertness: Lethargic Orientation Level: Oriented to person;Oriented to place;Disoriented to time Year: 2025 Day of Week: Incorrect Attention: Focused;Sustained Focused  Attention: Impaired Focused Attention Impairment: Verbal basic Sustained Attention: Impaired Sustained Attention Impairment: Verbal basic Memory: Impaired Memory Impairment: Decreased short term memory Decreased Short Term Memory: Verbal basic Executive Function: Reasoning Reasoning: Impaired Reasoning Impairment: Verbal basic Behaviors: Other (comment) (drowsy)       Comprehension  Auditory Comprehension Commands: Within Functional Limits Conversation: Simple Visual Recognition/Discrimination Discrimination: Not tested Reading Comprehension Reading Status: Not tested    Expression Expression Primary Mode of Expression: Verbal Verbal Expression Overall Verbal Expression: Appears within functional limits for tasks assessed Naming: Impairment Divergent: 75-100% accurate   Oral / Motor  Oral Motor/Sensory Function Overall Oral Motor/Sensory Function: Mild impairment Facial ROM: Reduced right Facial Symmetry: Abnormal symmetry right Lingual ROM: Reduced right;Reduced left Lingual Symmetry: Within Functional Limits Lingual Strength: Reduced Mandible: Within Functional Limits Motor Speech Overall Motor Speech: Impaired Respiration: Impaired (mildly tachypneic at rest (27)) Phonation: Low vocal intensity Resonance: Within functional limits Articulation: Impaired Intelligibility: Intelligibility reduced Word: 75-100% accurate Motor Planning: Within functional limits Motor Speech Errors: Not applicable            Anette FORBES Grippe, MA, CCC-SLP Acute Rehabilitation Services Office: [phone] 02/05/2024, 11:03 AM

## 2024-02-05 NOTE — Consult Note (Signed)
 NAME:  Christina Bennett, MRN:  [id number], DOB:  Aug 07, 1941, LOS: 2 ADMISSION DATE:  02/02/2024, CONSULTATION DATE:  02/05/24 REFERRING MD:  Dr Jonel, CHIEF COMPLAINT:  Stroke   History of Present Illness:  Code stroke early a.m. 02/05/2024, received TNKase.  Complained of left sided weakness at 1:10 AM  Admitted to the ED 6/23 with weakness, headache, poor appetite, chest congestion, some slurring of speech Was agitated and confused in the ED received multiple medications including Ativan, droperidol, Geodon, Haldol. Mental status apparently improved back to baseline over the next 24 hours Concern for possible UTI/pneumonia-procalcitonin negative-antibiotics was discontinued Apparently returned to baseline  Background history of type 2 diabetes, coronary artery disease, hypertension, hyperlipidemia, chronic obstructive pulm disease chronic kidney disease stage III  Lives independently  Pertinent  Medical History   Past Medical History:  Diagnosis Date   Anemia    iron insufion on 05/2016    Atypical chest pain    Atypical CP--stress test, cor angio, and CT chest negative in 2005. Nuclear study normal in 2011    COPD (chronic obstructive pulmonary disease) (HCC)    Diabetes (HCC)    Diabetes type 2- 1/13- metformin started   Dyslipidemia    Endometriosis    with ovarian radiation in 1966, gyn Dr cousins   GERD (gastroesophageal reflux disease)    H/H   Herpes    herpes of the left eye, Dr gust optho   History of DVT (deep vein thrombosis)    on OCP   History of kidney stones    Hypertension    Mild anemia    WBC mild high, lab 2012/ HEMATOLOGY    Osteoarthritis    Osteoarthritis of the hip, knee, and hand, vicodin , prn   Osteopenia    BD in 2013   Tobacco use    quit 03/2008   Vitamin D deficiency      Significant Hospital Events: Including procedures, antibiotic start and stop dates in addition to other pertinent events   Admitted 6/23 Code stroke 6/26,  received TNKase 6/26 CT head with no acute intracranial abnormality, moderate microvascular changes, remote lacunar infarct 6/26 CT angio head and neck with contrast, focal severe proximal right P2 stenosis, moderate stenosis at the origin of the vertebral artery  Interim History / Subjective:  Awake alert interactive Complaining of pain in her back and also that she wants to eat  Objective    Blood pressure (!) 153/59, pulse 86, temperature 99 F (37.2 C), temperature source Oral, resp. rate (!) 26, height 5' 2 (1.575 m), weight 89.6 kg, SpO2 95%.        Intake/Output Summary (Last 24 hours) at 02/05/2024 0757 Last data filed at 02/05/2024 0700 Gross per 24 hour  Intake 542.09 ml  Output 1450 ml  Net -907.91 ml   Filed Weights   02/03/24 1519  Weight: 89.6 kg    Examination: General: Elderly, does not appear to be in distress HENT: Moist oral mucosa Lungs: Clear breath sounds Cardiovascular: S1-S2 appreciated Abdomen: Soft, bowel sounds appreciated Extremities: No clubbing, no edema Neuro: Left-sided weakness, able to squeeze fingers, not able to move arm, able to minimally wiggle left foot toes.  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  6/24 - Antibiotics discontinued following quick improvement to baseline, negative procalcitonin  Chronic obstructive pulmonary disease - Was on Breztri at home - Continue bronchodilator  treatments  - This does not appear to be exacerbated at present  History of hypertension - On hydrochlorothiazide, losartan  Type 2 diabetes - SSI  Chronic kidney disease stage IIIa - Renal functions remained stable  DVT prophylaxis with SCD at present  Best Practice (right click and Reselect all SmartList Selections daily)   Diet/type: NPO DVT prophylaxis SCD Pressure ulcer(s): N/A GI prophylaxis: N/A Lines: N/A Foley:  N/A Code Status:  full code Last date of multidisciplinary goals of care discussion [Per primary]  Labs   CBC: Recent Labs  Lab 02/02/24 1603 02/03/24 0418 02/04/24 0312 02/05/24 0448  WBC 17.7* 15.8* 17.8* 13.6*  NEUTROABS  --   --  14.6* 10.1*  HGB 12.9 12.8 11.8* 12.2  HCT 43.5 43.4 39.8 41.0  MCV 85.8 85.3 84.5 85.2  PLT 289 266 283 270    Basic Metabolic Panel: Recent Labs  Lab 02/02/24 1603 02/03/24 0418 02/04/24 0312 02/05/24 0448  NA 138 135 138 138  K 4.8 4.5 4.2 3.9  CL 101 103 106 106  CO2 22 22 25  21*  GLUCOSE 202* 265* 145* 123*  BUN 17 19 25* 23  CREATININE 1.05* 1.08* 0.90 0.96  CALCIUM 9.1 8.6* 8.4* 8.9  MG  --   --  2.1 2.0   GFR: Estimated Creatinine Clearance: 47.8 mL/min (by C-G formula based on SCr of 0.96 mg/dL). Recent Labs  Lab 02/02/24 1603 02/03/24 0418 02/04/24 0312 02/05/24 0448  PROCALCITON  --  <0.10  --   --   WBC 17.7* 15.8* 17.8* 13.6*    Liver Function Tests: Recent Labs  Lab 02/03/24 0418  AST 14*  ALT 8  ALKPHOS 98  BILITOT 0.6  PROT 6.9  ALBUMIN 3.2*   No results for input(s): LIPASE, AMYLASE in the last 168 hours. Recent Labs  Lab 02/04/24 0312  AMMONIA 45*    ABG No results found for: PHART, PCO2ART, PO2ART, HCO3, TCO2, ACIDBASEDEF, O2SAT   Coagulation Profile: No results for input(s): INR, PROTIME in the last 168 hours.  Cardiac Enzymes: No results for input(s): CKTOTAL, CKMB, CKMBINDEX, TROPONINI in the last 168 hours.  HbA1C: Hgb  A1c MFr Bld  Date/Time Value Ref Range Status  02/03/2024 04:18 AM 9.2 (H) 4.8 - 5.6 % Final    Comment:    (NOTE) Diagnosis of Diabetes The following HbA1c ranges recommended by the American Diabetes Association (ADA) may be used as an aid in the diagnosis of diabetes mellitus.  Hemoglobin             Suggested A1C NGSP%              Diagnosis  <5.7                   Non Diabetic  5.7-6.4                Pre-Diabetic  >6.4                   Diabetic  <7.0  Glycemic control for                       adults with diabetes.      CBG: Recent Labs  Lab 02/04/24 0447 02/04/24 0822 02/04/24 1737 02/04/24 2014 02/05/24 0119  GLUCAP 156* 123* 116* 127* 141*    Review of Systems:   Complaints of pain and discomfort  Past Medical History:  She,  has a past medical history of Anemia, Atypical chest pain, COPD (chronic obstructive pulmonary disease) (HCC), Diabetes (HCC), Dyslipidemia, Endometriosis, GERD (gastroesophageal reflux disease), Herpes, History of DVT (deep vein thrombosis), History of kidney stones, Hypertension, Mild anemia, Osteoarthritis, Osteopenia, Tobacco use, and Vitamin D deficiency.   Surgical History:   Past Surgical History:  Procedure Laterality Date   APPENDECTOMY     BALLOON DILATION N/A 07/09/2016   Procedure: BALLOON DILATION;  Surgeon: Gladis MARLA Louder, MD;  Location: [facility];  Service: Endoscopy;  Laterality: N/A;   COLONOSCOPY WITH PROPOFOL  N/A 07/09/2016   Procedure: COLONOSCOPY WITH PROPOFOL ;  Surgeon: Gladis MARLA Louder, MD;  Location: [facility];  Service: Endoscopy;  Laterality: N/A;   ESOPHAGOGASTRODUODENOSCOPY N/A 07/09/2016   Procedure: ESOPHAGOGASTRODUODENOSCOPY (EGD);  Surgeon: Gladis MARLA Louder, MD;  Location: [facility];  Service: Endoscopy;  Laterality: N/A;   EYE SURGERY     left eye cataract srugery    HERNIA REPAIR     surgery fro endometriosis     TONSILLECTOMY       Social History:   reports that  she has quit smoking. She has never used smokeless tobacco. She reports that she does not drink alcohol and does not use drugs.   Family History:  Her family history includes Heart disease in her brother and brother; Hypertension in her mother.   Allergies Allergies  Allergen Reactions   Aspirin Swelling and Rash    Throat swells.   Bee Venom Anaphylaxis   Ibuprofen Shortness Of Breath and Swelling    Throat swells.   Percocet [Oxycodone-Acetaminophen ] Itching     Home Medications  Prior to Admission medications   Medication Sig Start Date End Date Taking? Authorizing Provider  albuterol  (PROVENTIL  HFA;VENTOLIN  HFA) 108 (90 BASE) MCG/ACT inhaler Inhale 2 puffs into the lungs every 4 (four) hours as needed for wheezing or shortness of breath.     [provider]  BREZTRI AEROSPHERE 160-9-4.8 MCG/ACT AERO Inhale 2 puffs into the lungs 2 (two) times daily. 03/19/21   [provider]  fluticasone (FLONASE) 50 MCG/ACT nasal spray Place 2 sprays into both nostrils every evening.  08/24/14   [provider]  glimepiride (AMARYL) 2 MG tablet Take 2 mg by mouth in the morning and at bedtime. 09/16/22   [provider]  hydrochlorothiazide (HYDRODIURIL) 25 MG tablet Take 25 mg by mouth daily. 10/26/18   [provider]  HYDROcodone -acetaminophen  (NORCO) 7.5-325 MG tablet Take 1 tablet by mouth 2 (two) times daily as needed for moderate pain.     [provider]  lidocaine  (LIDODERM ) 5 % Place 1 patch onto the skin daily. Remove & Discard patch within 12 hours or as directed by MD 11/01/19   Joy, Shawn C, PA-C  losartan (COZAAR) 100 MG tablet Take 100 mg by mouth daily. 10/26/18   [provider]  methocarbamol  (ROBAXIN ) 500 MG tablet Take 1 tablet (500 mg total) by mouth 2 (two) times daily. 11/01/19   Joy, Shawn C, PA-C  montelukast (SINGULAIR) 10 MG tablet Take 10 mg by  mouth every evening.  01/31/16   [provider]  omeprazole  (PRILOSEC) 20 MG capsule Take 20 mg by mouth daily.    [provider]  pantoprazole (PROTONIX) 40 MG tablet Take 40 mg by mouth daily. 01/13/24   [provider]  polyvinyl alcohol (LIQUIFILM TEARS) 1.4 % ophthalmic solution Place 1 drop into the left eye daily.    [provider]  prednisoLONE acetate (PRED FORTE) 1 % ophthalmic suspension Place 1 drop into the left eye daily. 09/12/22   [provider]  simvastatin (ZOCOR) 40 MG tablet Take 40 mg by mouth every evening.    [provider]  valACYclovir (VALTREX) 1000 MG tablet Take 1,000 mg by mouth daily.    [provider]  Vitamin D, Ergocalciferol, (DRISDOL) 50000 UNITS CAPS Take 50,000 Units by mouth every 14 (fourteen) days.    [provider]    The patient is critically ill with multiple organ systems failure and requires high complexity decision making for assessment and support, frequent evaluation and titration of therapies, application of advanced monitoring technologies and extensive interpretation of multiple databases. Critical Care Time devoted to patient care services described in this note independent of APP/resident time (if applicable)  is 35 minutes.   Jennet Epley MD [facility] Personal pager: See Amion If unanswered, please page CCM On-call: #[phone]

## 2024-02-05 NOTE — Progress Notes (Signed)
 OT Cancellation Note  Patient Details Name: Christina Bennett MRN: [id number] DOB: December 01, 1940   Cancelled Treatment:    Reason Eval/Treat Not Completed: Patient not medically ready Pt on strict bedrest and not medically ready to participate in OT evaluation at this time. Will follow up with pt tomorrow.  Leita Howell, OTR/L,CBIS  Supplemental OT - [facility] and [facility] Secure Chat Preferred   02/05/2024, 2:36 PM

## 2024-02-05 NOTE — Code Documentation (Signed)
 Stroke Response Nurse Documentation Code Documentation  JENAVIEVE FREDA is a 83 y.o. female admitted to [facility]  on 6/26 for AMS with past medical hx of HLD, CP, HTN. On No antithrombotic. Code stroke was activated by Nursing staff.   Patient on 2W unit where she was LKW at 0000 and now complaining of left sided weakness and slurred speech.   Stroke team at the bedside after patient activation. Patient to CT with team. NIHSS 7, see documentation for details and code stroke times. Patient with disoriented, left arm weakness, left leg weakness, Expressive aphasia , and dysarthria  on exam. The following imaging was completed:  CT Head and CTA. Patient is a candidate for IV Thrombolytic due to fixed neurological deficit. Patient is not a candidate for IR due to No LVO.   Care/Plan: TNKase.   Bedside handoff with RN Catarina.    Griselda Alm ORN  Rapid Response RN

## 2024-02-05 NOTE — H&P (Addendum)
 NEUROLOGY CONSULT NOTE   Date of service: February 05, 2024 Patient Name: Christina Bennett MRN:  [id number] DOB:  Jul 23, 1941 Chief Complaint: left sided weakness Requesting Provider: Vernon Ranks, MD  History of Present Illness  Christina Bennett is a 83 y.o. 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  At about 1:10 AM the patient called out to the nurse for left-sided weakness and tingling.  Code stroke was activated  She presented to the ED on 6/23 initially with generalized weakness, headache, poor appetite, chest congestion, blurry vision and slurred speech.  With at least some of her symptoms ongoing for 3 days, per her report and some limitation and inability to obtain history due to her confusion. While in the ED she became agitated and confused, got multiple doses medication for agitation as detailed below, and was started on antibiotics for presumed UTI/pneumonia. However ultimately procalcitonin was negative and she was felt to have perhaps some pulmonary edema so antibiotics were discontinued.  She returned to her baseline per primary team and nursing at bedside as well as family via phone (speech just slightly unclear, but 100% better than she had been).    She was having some tremoring of bilateral UE the past few weeks as well  6/23 - 6/24  Ativan 2216, Ativan 0028, droperidol 2.5 mg 0131, geodon 0302, haldol 0617, 1236, 2257  LKW: 00:00 on 6/26, sx discovery at 1:10 AM Modified rankin score: 0 -- lives independently, still driving IV Thrombolysis: Yes, 2:20 AM  Risk, benefits and alternatives were discussed with family over phone and they consented to administration of TNK (emergency contact Christina Bennett, niece) EVT: No, no LVO   NIHSS components Score: Comment  1a  Level of Conscious 0[x]  1[]  2[]  3[]      1b LOC Questions 0[]  1[x]  2[]       1c LOC Commands 0[x]  1[]  2[]       2 Best Gaze 0[x]  1[]  2[]       3 Visual 0[]  1[]  2[x]  3[]     After TNK able to count fingers bilaterally   4 Facial Palsy 0[x]  1[]  2[]  3[]      5a Motor Arm - left 0[]  1[]  2[x]  3[]  4[]  UN[]    5b Motor Arm - Right 0[x]  1[]  2[]  3[]  4[]  UN[]    6a Motor Leg - Left 0[]  1[]  2[x]  3[]  4[]  UN[]    6b Motor Leg - Right 0[x]  1[]  2[]  3[]  4[]  UN[]    7 Limb Ataxia 0[x]  1[]  2[]  UN[]      8 Sensory 0[x]  1[]  2[]  UN[]      9 Best Language 0[]  1[x]  2[]  3[]      10 Dysarthria 0[]  1[x]  2[]  UN[]      11 Extinct. and Inattention 0[x]  1[]  2[]       TOTAL: 9       ROS  Unable to assess secondary to patient's mental status   Past History   Past Medical History:  Diagnosis Date   Anemia    iron insufion on 05/2016    Atypical chest pain    Atypical CP--stress test, cor angio, and CT chest negative in 2005. Nuclear study normal in 2011    COPD (chronic obstructive pulmonary disease) (HCC)    Diabetes (HCC)    Diabetes type 2- 1/13-  metformin started   Dyslipidemia    Endometriosis    with ovarian radiation in 1966, gyn Dr cousins   GERD (gastroesophageal reflux disease)    H/H   Herpes    herpes of the left eye, Dr gust optho   History of DVT (deep vein thrombosis)    on OCP   History of kidney stones    Hypertension    Mild anemia    WBC mild high, lab 2012/ HEMATOLOGY    Osteoarthritis    Osteoarthritis of the hip, knee, and hand, vicodin , prn   Osteopenia    BD in 2013   Tobacco use    quit 03/2008   Vitamin D deficiency     Past Surgical History:  Procedure Laterality Date   APPENDECTOMY     BALLOON DILATION N/A 07/09/2016   Procedure: BALLOON DILATION;  Surgeon: Gladis MARLA Louder, MD;  Location: [facility];  Service: Endoscopy;  Laterality: N/A;   COLONOSCOPY WITH PROPOFOL  N/A 07/09/2016   Procedure: COLONOSCOPY WITH PROPOFOL ;  Surgeon: Gladis MARLA Louder, MD;  Location: [facility];  Service: Endoscopy;  Laterality: N/A;   ESOPHAGOGASTRODUODENOSCOPY N/A 07/09/2016   Procedure: ESOPHAGOGASTRODUODENOSCOPY (EGD);  Surgeon: Gladis MARLA Louder, MD;  Location: [facility];  Service: Endoscopy;  Laterality: N/A;   EYE SURGERY     left eye cataract srugery    HERNIA REPAIR     surgery fro endometriosis     TONSILLECTOMY      Family History: Family History  Problem Relation Age of Onset   Hypertension Mother    Heart disease Brother    Heart disease Brother     Social History  reports that she has quit smoking. She has never used smokeless tobacco. She reports that she does not drink alcohol and does not use drugs.  Allergies  Allergen Reactions   Aspirin Swelling and Rash    Throat swells.   Bee Venom Anaphylaxis   Ibuprofen Shortness Of Breath and Swelling    Throat swells.   Percocet [Oxycodone-Acetaminophen ] Itching    Medications   Current Facility-Administered Medications:    [START ON 02/06/2024]  stroke: early stages of recovery book, , Does not apply, Once, Susi Goslin L, MD   0.9 %  sodium chloride  infusion, , Intravenous, Continuous, Dusti Tetro L, MD   budesonide-glycopyrrolate-formoterol (BREZTRI) 160-9-4.8 MCG/ACT inhaler 2 puff, 2 puff, Inhalation, BID, Sundil, Subrina, MD, 2 puff at 02/04/24 1956   dextrose 50 % solution 50 mL, 50 mL, Intravenous, PRN, Sundil, Subrina, MD   diphenhydrAMINE (BENADRYL) injection 50 mg, 50 mg, Intravenous, Q6H PRN, Sundil, Subrina, MD   haloperidol lactate (HALDOL) injection 2 mg, 2 mg, Intravenous, Q6H PRN, Sundil, Subrina, MD, 2 mg at 02/03/24 2254   hydrALAZINE (APRESOLINE) injection 10 mg, 10 mg, Intravenous, Q6H PRN, Sundil, Subrina, MD   hydrochlorothiazide (HYDRODIURIL) tablet 25 mg, 25 mg, Oral, Daily, Sundil, Subrina, MD, 25 mg at 02/04/24 0957   insulin aspart (novoLOG) injection 0-9 Units, 0-9 Units, Subcutaneous, Q4H, Patsy Lenis, MD, 1 Units at 02/04/24 2058   ipratropium-albuterol   (DUONEB) 0.5-2.5 (3) MG/3ML nebulizer solution 3 mL, 3 mL, Nebulization, Q6H PRN, Sundil, Subrina, MD   lidocaine  (LIDODERM ) 5 % 1 patch, 1 patch, Transdermal, Q24H, Hall, Carole N, DO, 1 patch at 02/04/24 2100   losartan (COZAAR) tablet 100 mg, 100 mg, Oral, Daily, Sundil, Subrina, MD, 100 mg at 02/04/24 0957   montelukast (SINGULAIR) tablet 10 mg, 10 mg, Oral, QPM, Sundil, Subrina, MD, 10 mg at  02/04/24 1819   ondansetron  (ZOFRAN ) tablet 4 mg, 4 mg, Oral, Q6H PRN **OR** ondansetron  (ZOFRAN ) injection 4 mg, 4 mg, Intravenous, Q6H PRN, Sundil, Subrina, MD   Oral care mouth rinse, 15 mL, Mouth Rinse, PRN, Patsy Lenis, MD   pantoprazole (PROTONIX) injection 40 mg, 40 mg, Intravenous, QHS, Karene Bracken L, MD   simvastatin (ZOCOR) tablet 40 mg, 40 mg, Oral, QPM, Sundil, Subrina, MD, 40 mg at 02/04/24 1819   sodium chloride  flush (NS) 0.9 % injection 3 mL, 3 mL, Intravenous, Q12H, Sundil, Subrina, MD, 3 mL at 02/04/24 2100   sodium chloride  flush (NS) 0.9 % injection 3 mL, 3 mL, Intravenous, PRN, Sundil, Subrina, MD   sodium chloride  flush (NS) 0.9 % injection 3-10 mL, 3-10 mL, Intravenous, Q12H, Pahwani, Ravi, MD   sodium chloride  flush (NS) 0.9 % injection 3-10 mL, 3-10 mL, Intravenous, PRN, Vernon Ranks, MD   tenecteplase (TNKASE) injection for Stroke 22 mg, 0.25 mg/kg, Intravenous, Once, Satoya Feeley L, MD  Vitals   Vitals:   02/04/24 1957 02/04/24 2013 02/05/24 0117 02/05/24 0142  BP:  (!) 159/55 (!) 147/59 (!) 161/74  Pulse:  98 85 88  Resp:  19 19 16   Temp:  97.7 F (36.5 C) (!) 97.5 F (36.4 C) 97.6 F (36.4 C)  TempSrc:    Axillary  SpO2: 96% 96% 98% 98%  Weight:      Height:        Body mass index is 36.13 kg/m.   Physical Exam   Constitutional: Appears frail Psych: Affect calm and cooperative  Eyes: No scleral injection HENT: No oropharyngeal obstruction.  MSK: no joint deformities.  Cardiovascular: Normal rate and regular rhythm. Perfusing extremities  well Respiratory: Effort normal, non-labored breathing GI: Soft.  No distension. There is no tenderness.  Skin: Warm dry and intact visible skin  Neurologic Examination   Mental Status: Patient is sleepy and disoriented stating that the month is January.  Poor attention/concentration and therefore only following commands with continuous coaching. Cranial Nerves: II: Visual Niccoli notable for not blinking to threat on the left initially, however after TNK administered was able to count fingers bilaterally. Pupils are equal, round, and reactive to light, clouding of the left cornea noted III,IV, VI: EOMI to tracking examiner V: Facial sensation is symmetric to eyelash brush VII: Facial movement is symmetric.  VIII: hearing is intact to voice Motor: She has clear left-sided weakness with both arm and leg drifting quickly to the bed even with extensive coaching while the right side maintains good antigravity strength Sensory: Grossly equally reactive to touch in all 4 extremities.  Does report sensation is equal, does not extinguish to double simultaneous stimuli Deep Tendon Reflexes: Brachioradialis 3+ bilaterally, somewhat brisker on the right compared to the left Cerebellar: Finger-nose intact bilaterally, heel-to-shin limited by attention/concentration Gait:  Deferred in acute setting    Labs/Imaging/Neurodiagnostic studies   CBC:  Recent Labs  Lab 2024-02-29 0418 02/04/24 0312  WBC 15.8* 17.8*  NEUTROABS  --  14.6*  HGB 12.8 11.8*  HCT 43.4 39.8  MCV 85.3 84.5  PLT 266 283   Basic Metabolic Panel:  Lab Results  Component Value Date   NA 138 02/04/2024   K 4.2 02/04/2024   CO2 25 02/04/2024   GLUCOSE 145 (H) 02/04/2024   BUN 25 (H) 02/04/2024   CREATININE 0.90 02/04/2024   CALCIUM 8.4 (L) 02/04/2024   GFRNONAA >60 02/04/2024   GFRAA 59 (L) 05/07/2016   Lipid Panel: No results  found for: LDLCALC  HgbA1c:  Lab Results  Component Value Date   HGBA1C 9.2 (H)  02/03/2024   CT Head without contrast(Personally reviewed): 1. Stable head CT.  No acute intracranial abnormality. 2. ASPECTS is 10. 3. Moderate chronic microvascular ischemic disease, with small remote lacunar infarct at the right frontal corona radiata.  CT angio Head and Neck with contrast(Personally reviewed): 1. Negative CTA for large vessel occlusion or other emergent finding. 2. Focal severe proximal right P2 stenosis. 3. Moderate stenosis at the origin of the right vertebral artery. 4. Atheromatous change about the carotid bifurcations and carotid siphons without hemodynamically significant stenosis. 5. Diffuse tortuosity of the major arterial vasculature of the head and neck, suggesting chronic underlying hypertension. 6.  Aortic Atherosclerosis (ICD10-I70.0).  ASSESSMENT   Christina Bennett is a 83 y.o. female with past medical history significant for uncontrolled diabetes (A1c 9.2%), hypertension, hyperlipidemia, coronary artery disease, COPD.  Based on history provided she had some generalized toxic/metabolic perhaps infectious process leading to her initial presentation, was markedly improved and now developed a new focal neurological deficit highly concerning for acute stroke.  RECOMMENDATIONS  # Stroke determined by clinical assessment, with left-sided weakness - fasting lipid panel, goal LDL less than 70 - Not meeting goal A1c less than 7% (9.2%) - MRI brain 24 hours post TNK, ordered for 2 AM on 6/27 - Frequent neuro checks per post TNK protocol - Echocardiogram - Hold antiplatelets and chemo DVT prophylaxis for 24 hours until post TNK head imaging completed; SCDs for now - Risk factor modification - Telemetry monitoring - Blood pressure goal   - Post TNK for 24  hours < 180/105, labetalol and clevidipine ordered as needed - PT consult, OT consult, Speech consult - Admitted to stroke  team ______________________________________________________________________   Lola Jernigan MD-PhD [facility] [phone]   CRITICAL CARE Performed by: Lola LITTIE Jernigan   Total critical care time: 50 minutes  Critical care time was exclusive of separately billable procedures and treating other patients.  Critical care was necessary to treat or prevent imminent or life-threatening deterioration.  Critical care was time spent personally by me on the following activities: development of treatment plan with patient and/or surrogate as well as nursing, discussions with consultants, evaluation of patient's response to treatment, examination of patient, obtaining history from patient or surrogate, ordering and performing treatments and interventions, ordering and review of laboratory studies, ordering and review of radiographic studies, pulse oximetry and re-evaluation of patient's condition.

## 2024-02-05 NOTE — Evaluation (Signed)
 Clinical/Bedside Swallow Evaluation Patient Details  Name: Christina Bennett MRN: [id number] Date of Birth: 08-Mar-1941  Today's Date: 02/05/2024 Time: SLP Start Time (ACUTE ONLY): 9041 SLP Stop Time (ACUTE ONLY): 1008 SLP Time Calculation (min) (ACUTE ONLY): 10 min  Past Medical History:  Past Medical History:  Diagnosis Date   Anemia    iron insufion on 05/2016    Atypical chest pain    Atypical CP--stress test, cor angio, and CT chest negative in 2005. Nuclear study normal in 2011    COPD (chronic obstructive pulmonary disease) (HCC)    Diabetes (HCC)    Diabetes type 2- 1/13- metformin started   Dyslipidemia    Endometriosis    with ovarian radiation in 1966, gyn Dr cousins   GERD (gastroesophageal reflux disease)    H/H   Herpes    herpes of the left eye, Dr gust optho   History of DVT (deep vein thrombosis)    on OCP   History of kidney stones    Hypertension    Mild anemia    WBC mild high, lab 2012/ HEMATOLOGY    Osteoarthritis    Osteoarthritis of the hip, knee, and hand, vicodin , prn   Osteopenia    BD in 2013   Tobacco use    quit 03/2008   Vitamin D deficiency    Past Surgical History:  Past Surgical History:  Procedure Laterality Date   APPENDECTOMY     BALLOON DILATION N/A 07/09/2016   Procedure: BALLOON DILATION;  Surgeon: Gladis MARLA Louder, MD;  Location: [facility];  Service: Endoscopy;  Laterality: N/A;   COLONOSCOPY WITH PROPOFOL  N/A 07/09/2016   Procedure: COLONOSCOPY WITH PROPOFOL ;  Surgeon: Gladis MARLA Louder, MD;  Location: [facility];  Service: Endoscopy;  Laterality: N/A;   ESOPHAGOGASTRODUODENOSCOPY N/A 07/09/2016   Procedure: ESOPHAGOGASTRODUODENOSCOPY (EGD);  Surgeon: Gladis MARLA Louder, MD;  Location: [facility];  Service: Endoscopy;  Laterality: N/A;   EYE SURGERY     left eye cataract srugery    HERNIA REPAIR     surgery fro endometriosis     TONSILLECTOMY     HPI:  Patient is an 83 y.o. female with PMH: CAD, COPD, essential HTN,  CKD stage III, morbid obesity. She presented to the Ed on 02/03/24 with multiple complaints inclucding generalized weakeness, HA, poor appetite, chest congestion, blurry vision and family reporting change in her speech. ED evaluation found her to be hemodynamically stable but with BP up to 183/86, UA showed evidence of UTI. CT head negative for acute intracranial abnormality or hemorrhage but did show moderate periventricular WM changes likely reflecting the sequela of small vessel ischemia. CXR showed diffuse interstitial opacities, R>L. While in ED, patient became very restless, anxious and fidgety. Despite multiple doses of Ativan to attempt MRI,, it was canceled due to patient not cooperating. Non-violent restraints placed. SLP swallow evalauation ordered secondary to family reports of swallowing difficulities as well as RN observing patient to not be able to swallow even smaller pills, and coughing with liquids.  MBSS 6/25 with recs for D3/NTL. Pt with code stroke overnight and reassessed 6/26 with change in swallow function.    Assessment / Plan / Recommendation  Clinical Impression  Pt presents with worsening swallow function following code stroke called overnight 6/26.  Pt given tnk, CT negative and MRI pending.  Pt completed MBSS 6/25 with oropharyngeal deficits in addition to cervical osteophyte impacting swallow function.  Mechanical soft diet with nectar thick liquids recommended.  Today, pt exhibited  R sided asymmetry and reduced ROM during OME, contralateral to L arm weakness.  RN reports that pt's ability to use LUE has been inconsistent.  Pt tolerated initial trials of nectar thick liquid and puree.  Pt with prolonged and ineffecitve mastication of graham cracker.  Reported cracker stuck in teeth.  Pt exhibited reflexive, wet cough following liquid wash to help clear solid bolus residue.  Further trials of nectar thick liquid resulted in additional coughing.    Recommend repeat instrumental  assessment prior to resuming PO diet.  Pt may have priority oral medications crushed with puree pending results of objective swallow study.   SLP Visit Diagnosis: Dysphagia, oropharyngeal phase (R13.12)    Aspiration Risk  Moderate aspiration risk    Diet Recommendation NPO    Medication Administration: Crushed with puree    Other  Recommendations Oral Care Recommendations: Oral care QID     Assistance Recommended at Discharge  N/A  Functional Status Assessment Patient has had a recent decline in their functional status and demonstrates the ability to make significant improvements in function in a reasonable and predictable amount of time.  Frequency and Duration min 2x/week  2 weeks       Prognosis Prognosis for improved oropharyngeal function: Fair Barriers to Reach Goals: Cognitive deficits      Swallow Study   General Date of Onset: 02/03/24 HPI: Patient is an 83 y.o. female with PMH: CAD, COPD, essential HTN, CKD stage III, morbid obesity. She presented to the Ed on 02/03/24 with multiple complaints inclucding generalized weakeness, HA, poor appetite, chest congestion, blurry vision and family reporting change in her speech. ED evaluation found her to be hemodynamically stable but with BP up to 183/86, UA showed evidence of UTI. CT head negative for acute intracranial abnormality or hemorrhage but did show moderate periventricular WM changes likely reflecting the sequela of small vessel ischemia. CXR showed diffuse interstitial opacities, R>L. While in ED, patient became very restless, anxious and fidgety. Despite multiple doses of Ativan to attempt MRI,, it was canceled due to patient not cooperating. Non-violent restraints placed. SLP swallow evalauation ordered secondary to family reports of swallowing difficulities as well as RN observing patient to not be able to swallow even smaller pills, and coughing with liquids.  MBSS 6/25 with recs for D3/NTL. Pt with code stroke overnight  and reassessed 6/26 with change in swallow function. Type of Study: Bedside Swallow Evaluation Previous Swallow Assessment: MBS 6/25 Diet Prior to this Study: NPO Temperature Spikes Noted: No Respiratory Status: Nasal cannula History of Recent Intubation: No Behavior/Cognition: Alert;Requires cueing;Distractible Oral Cavity Assessment: Within Functional Limits Oral Care Completed by SLP: No Oral Cavity - Dentition: Dentures, top;Missing dentition;Poor condition (lower arch) Self-Feeding Abilities: Needs assist Patient Positioning: Upright in bed Baseline Vocal Quality: Normal Volitional Cough: Weak (no crispness, heavy phonation.  Really a moan, and not a cough) Volitional Swallow: Unable to elicit    Oral/Motor/Sensory Function Overall Oral Motor/Sensory Function: Mild impairment Facial ROM: Reduced right Facial Symmetry: Abnormal symmetry right Lingual ROM: Reduced right;Reduced left Lingual Symmetry: Within Functional Limits Lingual Strength: Reduced Mandible: Within Functional Limits   Ice Chips Ice chips: Not tested   Thin Liquid Thin Liquid: Not tested    Nectar Thick Nectar Thick Liquid: Impaired Presentation: Straw Pharyngeal Phase Impairments: Cough - Immediate   Honey Thick Honey Thick Liquid: Not tested   Puree Puree: Within functional limits Presentation: Spoon   Solid     Solid: Impaired Oral Phase Impairments: Impaired mastication Oral  Phase Functional Implications: Prolonged oral transit;Oral holding;Oral residue Pharyngeal Phase Impairments: Cough - Immediate      Anette FORBES Grippe, MA, CCC-SLP Acute Rehabilitation Services Office: [phone] 02/05/2024,10:51 AM

## 2024-02-05 NOTE — Progress Notes (Signed)
 PT Cancellation Note  Patient Details Name: Christina Bennett MRN: [id number] DOB: 1941/08/08   Cancelled Treatment:    Reason Eval/Treat Not Completed: Medical issues which prohibited therapy - RN request hold until tomorrow.   Blen Ransome S, PT DPT Acute Rehabilitation Services Secure Chat Preferred  Office [phone]    [name] [place] 02/05/2024, 2:42 PM

## 2024-02-05 NOTE — Progress Notes (Addendum)
 0000 - Patient called out requesting to get back into the bed from the chair. Assisted x 2 back into the bed. Last known well.  0110 - Patient called out requesting to see nurse. 0115 - Patient reported to this RN that her left arm felt funny. Patient states she is unable to feel herself touching her left arm. Patient also having increased weakness in the left arm and leg. slurred/stuttered speech. Orientated to self and place. Patient is drowsy and having difficulty following commands.  9857 - Patient complaining of continued decreased sensation in the left arm and leg. This RN also noted slurred/stuttered speech. Orientated to self and place. Patient is drowsy and having difficulty following commands. NIHSS completed, FSBS checked, and VS obtained - see flowsheet. Consulting civil engineer and additional staff to bedside.  67 - Code stroke called 0150 - Code stroke team arrived 0204 - Arrival to CT 0220 - TNKASE administered 0308 - Called report to Catarina, RN on [facility] [phone] - Patient transferred with belongings to [facility] ICU room 22.  0404 - Florence Lew (emergency contact) and provided update on patient status and room change.

## 2024-02-05 NOTE — Progress Notes (Addendum)
 STROKE TEAM PROGRESS NOTE    SIGNIFICANT HOSPITAL EVENTS 6/24-patient admitted with headache, generalized weakness, decreased appetite and blurred vision 6/26-inpatient code stroke called for left-sided weakness and patient given TNK  INTERIM HISTORY/SUBJECTIVE  Patient has remained hemodynamically stable after TNK administration with Tmax of 99.5.  She reports some improvement in her left-sided weakness and is able to lift her left arm and leg off the bed.  RN reports that when patient is distracted, some of her symptoms improve.  OBJECTIVE  CBC    Component Value Date/Time   WBC 13.6 (H) 02/05/2024 0448   RBC 4.81 02/05/2024 0448   HGB 12.2 02/05/2024 0448   HGB 12.7 09/19/2022 1040   HGB 11.1 (L) 03/05/2017 0856   HCT 41.0 02/05/2024 0448   HCT 35.4 03/05/2017 0856   PLT 270 02/05/2024 0448   PLT 237 09/19/2022 1040   PLT 196 03/05/2017 0856   MCV 85.2 02/05/2024 0448   MCV 86.7 03/05/2017 0856   MCH 25.4 (L) 02/05/2024 0448   MCHC 29.8 (L) 02/05/2024 0448   RDW 17.2 (H) 02/05/2024 0448   RDW 18.2 (H) 03/05/2017 0856   LYMPHSABS 2.2 02/05/2024 0448   LYMPHSABS 1.5 03/05/2017 0856   MONOABS 1.2 (H) 02/05/2024 0448   MONOABS 1.0 (H) 03/05/2017 0856   EOSABS 0.1 02/05/2024 0448   EOSABS 0.2 03/05/2017 0856   BASOSABS 0.1 02/05/2024 0448   BASOSABS 0.1 03/05/2017 0856    BMET    Component Value Date/Time   NA 138 02/05/2024 0448   NA 141 05/02/2015 0952   K 3.9 02/05/2024 0448   K 4.2 05/02/2015 0952   CL 106 02/05/2024 0448   CL 104 04/23/2012 1017   CO2 21 (L) 02/05/2024 0448   CO2 28 05/02/2015 0952   GLUCOSE 123 (H) 02/05/2024 0448   GLUCOSE 103 05/02/2015 0952   GLUCOSE 121 (H) 04/23/2012 1017   BUN 23 02/05/2024 0448   BUN 13.0 05/02/2015 0952   CREATININE 0.96 02/05/2024 0448   CREATININE 0.7 05/02/2015 0952   CALCIUM 8.9 02/05/2024 0448   CALCIUM 9.4 05/02/2015 0952   EGFR 82 (L) 05/02/2015 0952   GFRNONAA 59 (L) 02/05/2024 0448    IMAGING past  24 hours CT ANGIO HEAD NECK W WO CM (CODE STROKE) Result Date: 02/05/2024 CLINICAL DATA:  Initial evaluation for acute neuro deficit, stroke suspected. EXAM: CT ANGIOGRAPHY HEAD AND NECK WITH AND WITHOUT CONTRAST TECHNIQUE: Multidetector CT imaging of the head and neck was performed using the standard protocol during bolus administration of intravenous contrast. Multiplanar CT image reconstructions and MIPs were obtained to evaluate the vascular anatomy. Carotid stenosis measurements (when applicable) are obtained utilizing NASCET criteria, using the distal internal carotid diameter as the denominator. RADIATION DOSE REDUCTION: This exam was performed according to the departmental dose-optimization program which includes automated exposure control, adjustment of the mA and/or kV according to patient size and/or use of iterative reconstruction technique. CONTRAST:  75mL OMNIPAQUE IOHEXOL 350 MG/ML SOLN COMPARISON:  CT from earlier the same day. FINDINGS: For caliber standard branch pattern. Aortic atherosclerosis. No significant stenosis about the origin the great vessels. CTA NECK FINDINGS Aortic arch: Visualized aortic arch within normal limits Right carotid system: Right common and internal carotid arteries are tortuous but patent without dissection. Atheromatous change about the right carotid bulb without hemodynamically significant greater than 50% stenosis. Left carotid system: Left common and internal carotid arteries are tortuous but patent without dissection. Atheromatous change about the left carotid bulb without hemodynamically significant  greater than 50% stenosis. Vertebral arteries: Both vertebral arteries arise from the subclavian arteries. Atheromatous change at the origin of the right vertebral artery with moderate stenosis. Vertebral arteries are tortuous but otherwise patent. No other stenosis or dissection. Skeleton: Few small sclerotic foci in noted within the cervical spine, favored to reflect  small benign bone islands. No worrisome osseous lesions. Moderate to advanced cervical spondylosis. Patient is largely edentulous. Other neck: No other acute finding. Few small right thyroid  nodules measuring up to 9 mm noted, of doubtful significance given size and patient age, no follow-up imaging recommended. Upper chest: No other acute finding. Review of the MIP images confirms the above findings CTA HEAD FINDINGS Anterior circulation: Atheromatous change about the carotid siphons without hemodynamically significant stenosis. A1 segments patent bilaterally. Normal anterior communicating artery complex. Both ACAs patent without stenosis. No M1 stenosis or occlusion. Distal MCA branches perfused and symmetric. Posterior circulation: Both V4 segments patent without significant stenosis. Both PICA patent. Basilar patent without stenosis. Superior cerebellar arteries patent bilaterally. Both PCAs primarily supplied via the basilar. Focal severe proximal right P2 stenosis (series 15, image 18). PCAs otherwise widely patent to their distal aspects. Venous sinuses: Patent allowing for timing the contrast bolus. Anatomic variants: As above.  No aneurysm. Review of the MIP images confirms the above findings IMPRESSION: 1. Negative CTA for large vessel occlusion or other emergent finding. 2. Focal severe proximal right P2 stenosis. 3. Moderate stenosis at the origin of the right vertebral artery. 4. Atheromatous change about the carotid bifurcations and carotid siphons without hemodynamically significant stenosis. 5. Diffuse tortuosity of the major arterial vasculature of the head and neck, suggesting chronic underlying hypertension. 6.  Aortic Atherosclerosis (ICD10-I70.0). These results were communicated to Dr. Jerrie at 2:47 am on 02/05/2024 by text page via the [employer] messaging system. Electronically Signed   By: Morene Hoard M.D.   On: 02/05/2024 02:49   CT HEAD CODE STROKE WO CONTRAST Result Date:  02/05/2024 CLINICAL DATA:  Code stroke. Initial evaluation for acute neuro deficit, stroke suspected. EXAM: CT HEAD WITHOUT CONTRAST TECHNIQUE: Contiguous axial images were obtained from the base of the skull through the vertex without intravenous contrast. RADIATION DOSE REDUCTION: This exam was performed according to the departmental dose-optimization program which includes automated exposure control, adjustment of the mA and/or kV according to patient size and/or use of iterative reconstruction technique. COMPARISON:  Head CT from 02/02/2024 FINDINGS: Brain: Cerebral volume within normal limits. Patchy hypodensity involving the supratentorial cerebral white matter, consistent with chronic small vessel ischemic disease, moderately advanced in nature. Small remote lacunar infarct present at the right frontal corona radiata. Overall, appearance is stable from prior. No acute intracranial hemorrhage. No acute large vessel territory infarct. No mass lesion or midline shift. No hydrocephalus or extra-axial fluid collection. Vascular: No abnormal hyperdense vessel. Calcified atherosclerosis present at the skull base. Skull: Scalp soft tissues within normal limits.  Calvarium intact. Sinuses/Orbits: Globes orbital soft tissues within normal limits. Paranasal sinuses are clear. Small chronic appearing right mastoid effusion, stable. Left mastoid air cells are clear. Other: None. ASPECTS [facility] Stroke Program Early CT Score) - Ganglionic level infarction (caudate, lentiform nuclei, internal capsule, insula, M1-M3 cortex): 7 - Supraganglionic infarction (M4-M6 cortex): 3 Total score (0-10 with 10 being normal): 10 IMPRESSION: 1. Stable head CT.  No acute intracranial abnormality. 2. ASPECTS is 10. 3. Moderate chronic microvascular ischemic disease, with small remote lacunar infarct at the right frontal corona radiata. Results communicated by telephone at the time  of interpretation on 02/05/2024 at 2:16 am to provider Dr.  Jerrie. Electronically Signed   By: Morene Hoard M.D.   On: 02/05/2024 02:18    Vitals:   02/05/24 0900 02/05/24 0930 02/05/24 1000 02/05/24 1030  BP: 134/83 (!) 150/68 133/60 (!) 148/61  Pulse: 88 90 83 88  Resp: (!) 24 (!) 24 18 19   Temp:      TempSrc:      SpO2: 95% 95% 94% 94%  Weight:      Height:         PHYSICAL EXAM General:  Alert, well-nourished, well-developed patient in no acute distress Psych:  Mood and affect appropriate for situation CV: Regular rate and rhythm on monitor Respiratory:  Regular, unlabored respirations on room air GI: Abdomen soft and nontender   NEURO:  Mental Status: AA&Ox3, patient is able to give some history Speech/Language: speech is with mild dysarthria and a stuttering pattern, naming and repetition intact  Cranial Nerves:  II: PERRL. Visual Castelli full.  III, IV, VI: EOMI. Eyelids elevate symmetrically.  V: Sensation is intact to light touch and symmetrical to face.  VII: Face is symmetrical resting and smiling VIII: hearing intact to voice. IX, X: Voice is mildly dysarthric XII: tongue is midline without fasciculations. Motor: Able to move right upper and lower extremities with good antigravity strength, lifts left arm and leg off the bed but drift present in both Tone: is normal and bulk is normal Sensation- Intact to light touch bilaterally.  Coordination: Unable to perform Gait- deferred  Most Recent NIH  1a Level of Conscious.: 0 1b LOC Questions: 0 1c LOC Commands: 0 2 Best Gaze: 0 3 Visual: 0 4 Facial Palsy: 0 5a Motor Arm - left: 2 5b Motor Arm - Right: 0 6a Motor Leg - Left: 2 6b Motor Leg - Right: 0 7 Limb Ataxia: 0 8 Sensory: 0 9 Best Language: 0 10 Dysarthria: 1 11 Extinct. and Inatten.: 0 TOTAL: 5   ASSESSMENT/PLAN  Christina Bennett is a 83 y.o. female with history of CAD, diabetes, hypertension, hyperlipidemia, COPD, CKD stage III keratitis of the left eye originally admitted for  generalized weakness, headache, decreased p.o. intake and blurred vision.  In the early morning of 6/26, she had sudden onset left-sided weakness and tingling.  Inpatient code stroke was called, and patient was given TNK.  Stroke like symptoms with  left sided weakness s/p TNK Etiology: Uncertain at this time, some functional component seen on exam Code Stroke CT head No acute abnormality. Small vessel disease. A ASPECTS 10.    CTA head & neck no LVO, proximal right P2 stenosis, moderate stenosis at origin of right vertebral artery  MRI pending 2D Echo EF 55-60% LDL 100 HgbA1c 9.2 VTE prophylaxis -SCDs No antithrombotic prior to admission, now on No antithrombotic as she is less than 24 hours from TNK administration  Disposition: Pending  Pulmonary edema CAD presented to the ED on 6/23 initially with generalized weakness, headache, poor appetite, chest congestion, blurry vision and slurred speech.  Was started on antibiotics for presumed UTI/pneumonia. However ultimately procalcitonin was negative and she was felt to have perhaps some pulmonary edema so antibiotics were discontinued.   Hypertension Home meds: Losartan 100 mg daily, hydrochlorothiazide 25 mg daily Stable Blood Pressure Goal: BP less than 180/105 post TNK  Hyperlipidemia Home meds: Simvastatin 40 mg daily daily LDL 100, goal < 70 changed to atorvastatin 40 mg Continue statin at discharge  Diabetes type II poorly  controlled Home meds: Glimepiride 2 mg twice daily HgbA1c 9.2, goal < 7.0 CBGs SSI Recommend close follow-up with PCP for better DM control  Dysphagia Patient has post-stroke dysphagia SLP consulted Now dys 2 with honey thick Advance diet as tolerated On IVF  Other Stroke Risk Factors Obesity, Body mass index is 36.13 kg/m., BMI >/= 30 associated with increased stroke risk, recommend weight loss, diet and exercise as appropriate  Advanced age  Other Active Problems COPD  Hospital day #  2  Patient seen by NP with MD, MD to edit note as needed. Cortney E Everitt Clint Kill , MSN, AGACNP-BC [facility] See Amion for schedule and pager information 02/05/2024 11:00 AM  ATTENDING NOTE: I reviewed above note and agree with the assessment and plan. Pt was seen and examined.   No family at the bedside. Pt lying in bed, awake, alert, eyes open, orientated to age, place, time but moderate to severe dysarthria with stuttering. Per RN, with distraction, she was able to speak clearly.  No aphasia, paucity of speech, following all simple commands. Able to name and repeat but with significant dysarthria and stuttering. No gaze palsy, tracking bilaterally, visual field full. No facial droop. Tongue midline. LUE flaccid proximal but bicep and tricep 2/5 but inconsistent, LLE no movement but also effort related. RUE and RLE 3/5. Sensation symmetrical bilaterally subjectively, RFTN intact, gait not tested.   Pt exam inconsistent, with functional component, MRI pending tonight. Continue post TNK care. Pt dysphagia but passed swallow on honey thick liquid, on IVF. On statin, no antithrombotics now given TNK. PT and OT pending.   For detailed assessment and plan, please refer to above as I have made changes wherever appropriate.   Christina Cummins, MD PhD Stroke Neurology 02/05/2024 6:47 PM  This patient is critically ill due to stroke like symptoms s/p TNK and at significant risk of neurological worsening, death form recurrent stroke, hemorrhagic conversion and bleeding from TNK. This patient's care requires constant monitoring of vital signs, hemodynamics, respiratory and cardiac monitoring, review of multiple databases, neurological assessment, discussion with family, other specialists and medical decision making of high complexity. I spent 30 minutes of neurocritical care time in the care of this patient.    To contact Stroke Continuity provider, please refer to [email or web address]. After hours,  contact General Neurology

## 2024-02-05 NOTE — Progress Notes (Signed)
 PHARMACIST CODE STROKE RESPONSE  Notified to mix TNK at 0216 by Dr. Jerrie TNK preparation completed at 0220  TNK dose = 22 mg IV over 5 seconds  Christina Bennett 02/05/24 2:21 AM

## 2024-02-06 ENCOUNTER — Inpatient Hospital Stay (HOSPITAL_COMMUNITY)

## 2024-02-06 DIAGNOSIS — J449 Chronic obstructive pulmonary disease, unspecified: Secondary | ICD-10-CM | POA: Diagnosis not present

## 2024-02-06 DIAGNOSIS — R531 Weakness: Secondary | ICD-10-CM | POA: Diagnosis not present

## 2024-02-06 DIAGNOSIS — I69391 Dysphagia following cerebral infarction: Secondary | ICD-10-CM

## 2024-02-06 DIAGNOSIS — R29704 NIHSS score 4: Secondary | ICD-10-CM

## 2024-02-06 DIAGNOSIS — I6381 Other cerebral infarction due to occlusion or stenosis of small artery: Secondary | ICD-10-CM | POA: Diagnosis not present

## 2024-02-06 DIAGNOSIS — E1151 Type 2 diabetes mellitus with diabetic peripheral angiopathy without gangrene: Secondary | ICD-10-CM

## 2024-02-06 DIAGNOSIS — I639 Cerebral infarction, unspecified: Secondary | ICD-10-CM | POA: Diagnosis not present

## 2024-02-06 DIAGNOSIS — E785 Hyperlipidemia, unspecified: Secondary | ICD-10-CM

## 2024-02-06 LAB — CBC WITH DIFFERENTIAL/PLATELET
Abs Immature Granulocytes: 0.05 10*3/uL (ref 0.00–0.07)
Basophils Absolute: 0 10*3/uL (ref 0.0–0.1)
Basophils Relative: 0 %
Eosinophils Absolute: 0.2 10*3/uL (ref 0.0–0.5)
Eosinophils Relative: 2 %
HCT: 40.5 % (ref 36.0–46.0)
Hemoglobin: 11.8 g/dL — ABNORMAL LOW (ref 12.0–15.0)
Immature Granulocytes: 0 %
Lymphocytes Relative: 15 %
Lymphs Abs: 1.7 10*3/uL (ref 0.7–4.0)
MCH: 24.7 pg — ABNORMAL LOW (ref 26.0–34.0)
MCHC: 29.1 g/dL — ABNORMAL LOW (ref 30.0–36.0)
MCV: 84.9 fL (ref 80.0–100.0)
Monocytes Absolute: 1 10*3/uL (ref 0.1–1.0)
Monocytes Relative: 8 %
Neutro Abs: 8.6 10*3/uL — ABNORMAL HIGH (ref 1.7–7.7)
Neutrophils Relative %: 75 %
Platelets: 216 10*3/uL (ref 150–400)
RBC: 4.77 MIL/uL (ref 3.87–5.11)
RDW: 16.9 % — ABNORMAL HIGH (ref 11.5–15.5)
WBC: 11.6 10*3/uL — ABNORMAL HIGH (ref 4.0–10.5)
nRBC: 0 % (ref 0.0–0.2)

## 2024-02-06 LAB — BASIC METABOLIC PANEL WITH GFR
Anion gap: 12 (ref 5–15)
BUN: 19 mg/dL (ref 8–23)
CO2: 24 mmol/L (ref 22–32)
Calcium: 8.8 mg/dL — ABNORMAL LOW (ref 8.9–10.3)
Chloride: 104 mmol/L (ref 98–111)
Creatinine, Ser: 0.88 mg/dL (ref 0.44–1.00)
GFR, Estimated: >60 mL/min (ref >60)
Glucose, Bld: 137 mg/dL — ABNORMAL HIGH (ref 70–99)
Potassium: 3.7 mmol/L (ref 3.5–5.1)
Sodium: 140 mmol/L (ref 135–145)

## 2024-02-06 LAB — GLUCOSE, CAPILLARY
Glucose-Capillary: 114 mg/dL — ABNORMAL HIGH (ref 70–99)
Glucose-Capillary: 128 mg/dL — ABNORMAL HIGH (ref 70–99)
Glucose-Capillary: 149 mg/dL — ABNORMAL HIGH (ref 70–99)
Glucose-Capillary: 132 mg/dL — ABNORMAL HIGH (ref 70–99)
Glucose-Capillary: 105 mg/dL — ABNORMAL HIGH (ref 70–99)
Glucose-Capillary: 114 mg/dL — ABNORMAL HIGH (ref 70–99)

## 2024-02-06 LAB — MAGNESIUM: Magnesium: 2.2 mg/dL (ref 1.7–2.4)

## 2024-02-06 MED ORDER — ENOXAPARIN SODIUM 40 MG/0.4ML IJ SOSY
40.0000 mg | PREFILLED_SYRINGE | INTRAMUSCULAR | Status: DC
  Administered 2024-02-06 – 2024-02-10 (×5): 40 mg via SUBCUTANEOUS
  Filled 2024-02-06 (×4): qty 0.4

## 2024-02-06 MED ORDER — INSULIN ASPART 100 UNIT/ML IJ SOLN
0.0000 [IU] | Freq: Every day | INTRAMUSCULAR | Status: DC
  Administered 2024-02-13: 2 [IU] via SUBCUTANEOUS
  Administered 2024-02-14: 3 [IU] via SUBCUTANEOUS
  Administered 2024-02-15 – 2024-02-16 (×2): 5 [IU] via SUBCUTANEOUS
  Administered 2024-02-17 – 2024-02-23 (×5): 2 [IU] via SUBCUTANEOUS

## 2024-02-06 MED ORDER — CALCIUM CARBONATE ANTACID 500 MG PO CHEW
1.0000 | CHEWABLE_TABLET | ORAL | Status: DC | PRN
  Administered 2024-02-13: 200 mg via ORAL
  Filled 2024-02-06 (×2): qty 1

## 2024-02-06 MED ORDER — CLOPIDOGREL BISULFATE 75 MG PO TABS
75.0000 mg | ORAL_TABLET | Freq: Every day | ORAL | Status: DC
  Administered 2024-02-06 – 2024-02-15 (×10): 75 mg via ORAL
  Filled 2024-02-06 (×10): qty 1

## 2024-02-06 MED ORDER — INSULIN ASPART 100 UNIT/ML IJ SOLN
0.0000 [IU] | Freq: Three times a day (TID) | INTRAMUSCULAR | Status: DC
  Administered 2024-02-06: 2 [IU] via SUBCUTANEOUS
  Administered 2024-02-06 – 2024-02-11 (×6): 1 [IU] via SUBCUTANEOUS
  Administered 2024-02-11: 2 [IU] via SUBCUTANEOUS
  Administered 2024-02-12: 1 [IU] via SUBCUTANEOUS
  Administered 2024-02-12 – 2024-02-13 (×2): 2 [IU] via SUBCUTANEOUS
  Administered 2024-02-13 – 2024-02-14 (×3): 3 [IU] via SUBCUTANEOUS
  Administered 2024-02-14: 2 [IU] via SUBCUTANEOUS
  Administered 2024-02-15 – 2024-02-16 (×4): 7 [IU] via SUBCUTANEOUS
  Administered 2024-02-16 – 2024-02-17 (×3): 9 [IU] via SUBCUTANEOUS
  Administered 2024-02-17: 3 [IU] via SUBCUTANEOUS
  Administered 2024-02-17: 7 [IU] via SUBCUTANEOUS
  Administered 2024-02-18 (×2): 5 [IU] via SUBCUTANEOUS
  Administered 2024-02-18 – 2024-02-19 (×4): 3 [IU] via SUBCUTANEOUS
  Administered 2024-02-20: 2 [IU] via SUBCUTANEOUS
  Administered 2024-02-20 – 2024-02-21 (×3): 3 [IU] via SUBCUTANEOUS
  Administered 2024-02-21 (×2): 2 [IU] via SUBCUTANEOUS
  Administered 2024-02-22 – 2024-02-23 (×6): 3 [IU] via SUBCUTANEOUS
  Administered 2024-02-24: 5 [IU] via SUBCUTANEOUS

## 2024-02-06 MED ORDER — LOSARTAN POTASSIUM 50 MG PO TABS
100.0000 mg | ORAL_TABLET | Freq: Every day | ORAL | Status: DC
  Administered 2024-02-06 – 2024-02-09 (×4): 100 mg via ORAL
  Filled 2024-02-06 (×5): qty 2

## 2024-02-06 MED ORDER — SODIUM CHLORIDE 0.9 % IV SOLN: INTRAVENOUS | Status: DC

## 2024-02-06 MED ORDER — PANTOPRAZOLE SODIUM 40 MG PO TBEC
40.0000 mg | DELAYED_RELEASE_TABLET | Freq: Every day | ORAL | Status: DC
  Administered 2024-02-07 – 2024-02-21 (×11): 40 mg via ORAL
  Filled 2024-02-06 (×15): qty 1

## 2024-02-06 MED ORDER — HYDROCODONE-ACETAMINOPHEN 7.5-325 MG PO TABS
1.0000 | ORAL_TABLET | Freq: Four times a day (QID) | ORAL | Status: DC | PRN
  Administered 2024-02-06: 2 via ORAL
  Administered 2024-02-06 (×2): 1 via ORAL
  Administered 2024-02-07 – 2024-02-10 (×8): 2 via ORAL
  Administered 2024-02-11 (×2): 1 via ORAL
  Administered 2024-02-11: 2 via ORAL
  Administered 2024-02-12: 1 via ORAL
  Administered 2024-02-12 – 2024-02-13 (×3): 2 via ORAL
  Administered 2024-02-14: 1 via ORAL
  Administered 2024-02-14 – 2024-02-15 (×2): 2 via ORAL
  Administered 2024-02-15: 1 via ORAL
  Administered 2024-02-16: 2 via ORAL
  Administered 2024-02-16: 1 via ORAL
  Administered 2024-02-16 – 2024-02-19 (×4): 2 via ORAL
  Administered 2024-02-20: 1 via ORAL
  Administered 2024-02-20 (×2): 2 via ORAL
  Administered 2024-02-21 (×3): 1 via ORAL
  Administered 2024-02-22: 2 via ORAL
  Administered 2024-02-22 – 2024-02-25 (×4): 1 via ORAL
  Filled 2024-02-06 (×2): qty 2
  Filled 2024-02-06 (×3): qty 1
  Filled 2024-02-06: qty 2
  Filled 2024-02-06: qty 1
  Filled 2024-02-06: qty 2
  Filled 2024-02-06 (×2): qty 1
  Filled 2024-02-06 (×3): qty 2
  Filled 2024-02-06: qty 1
  Filled 2024-02-06 (×4): qty 2
  Filled 2024-02-06 (×2): qty 1
  Filled 2024-02-06 (×3): qty 2
  Filled 2024-02-06: qty 1
  Filled 2024-02-06: qty 2
  Filled 2024-02-06: qty 1
  Filled 2024-02-06 (×2): qty 2
  Filled 2024-02-06: qty 1
  Filled 2024-02-06 (×2): qty 2
  Filled 2024-02-06 (×2): qty 1
  Filled 2024-02-06 (×3): qty 2
  Filled 2024-02-06 (×4): qty 1
  Filled 2024-02-06 (×2): qty 2

## 2024-02-06 NOTE — Progress Notes (Signed)
 Speech Language Pathology Treatment: Dysphagia;Cognitive-Linguistic  Patient Details Name: Christina Bennett MRN: [id number] DOB: Dec 05, 1940 Today's Date: 02/06/2024 Time: 9073-9054 SLP Time Calculation (min) (ACUTE ONLY): 19 min  Assessment / Plan / Recommendation Clinical Impression  Upon SLP arrival, pt thinks she knows SLP from PTA and has limited recall of MBS results from previous date. Education was given about recommendations and rationale, although with frequent cueing needed for carry over even within session. Cueing also needed for awareness, as pt acknowledges some mild dysphagia symptoms PTA but does not account for acute deficits now present.   During PO trials, pt continues to exhibit oral deficits, appearing to have oral holding and/or slow oral transit. With honey thick liquids this is intermittently followed by immediate coughing that is concerning for spillage into the airway, although this was only observed with thinner consistencies on MBS. Pt self-reports that they don't go down well. Interestingly this was noted with lemon flavored water  regardless of delivery method, but was eliminated when transitioned back to honey thick orange juice. Pt also had juice during breakfast and RN reports no coughing or overt difficulty. Question if this could be related to taste (pt disliked the lemon flavored water ), but it will definitely need to be monitored closely, as aspiration of other consistencies was gross in volume. Will leave current diet in place with careful assistance from staff during any intake, holding POs if coughing persists.    HPI HPI: Patient is an 83 y.o. female with PMH: CAD, COPD, essential HTN, CKD stage III, morbid obesity. She presented to the Ed on 02/03/24 with multiple complaints inclucding generalized weakeness, HA, poor appetite, chest congestion, blurry vision and family reporting change in her speech. ED evaluation found her to be hemodynamically stable but with  BP up to 183/86, UA showed evidence of UTI. CT head negative for acute intracranial abnormality or hemorrhage but did show moderate periventricular WM changes likely reflecting the sequela of small vessel ischemia. CXR showed diffuse interstitial opacities, R>L. While in ED, patient became very restless, anxious and fidgety. Despite multiple doses of Ativan  to attempt MRI,, it was canceled due to patient not cooperating. Non-violent restraints placed. SLP swallow evalauation ordered secondary to family reports of swallowing difficulities as well as RN observing patient to not be able to swallow even smaller pills, and coughing with liquids.  MBSS 6/25 with recs for D3/NTL. Pt with code stroke overnight and reassessed 6/26 with change in swallow function.      SLP Plan  Continue with current plan of care          Recommendations  Diet recommendations: Dysphagia 2 (fine chop);Honey-thick liquid Liquids provided via: Cup;Teaspoon;No straw Medication Administration: Crushed with puree Supervision: Staff to assist with self feeding;Full supervision/cueing for compensatory strategies Compensations: Slow rate;Small sips/bites;Minimize environmental distractions Postural Changes and/or Swallow Maneuvers: Seated upright 90 degrees;Upright 30-60 min after meal                  Oral care QID   Frequent or constant Supervision/Assistance Dysphagia, oropharyngeal phase (R13.12)     Continue with current plan of care     Leita SAILOR., M.A. CCC-SLP Acute Rehabilitation Services Office: [phone]  Secure chat preferred   02/06/2024, 9:52 AM

## 2024-02-06 NOTE — Progress Notes (Signed)
 NAME:  Christina Bennett, MRN:  [id number], DOB:  1940/09/18, LOS: 3 ADMISSION DATE:  02/02/2024, CONSULTATION DATE:  02/05/24 REFERRING MD:  Dr Jonel, CHIEF COMPLAINT:  Stroke   History of Present Illness:  Code stroke early a.m. 02/05/2024, received TNKase .  Complained of left sided weakness at 1:10 AM   Admitted to the ED 6/23 with weakness, headache, poor appetite, chest congestion, some slurring of speech Was agitated and confused in the ED received multiple medications including Ativan , droperidol , Geodon , Haldol . Mental status apparently improved back to baseline over the next 24 hours Concern for possible UTI/pneumonia-procalcitonin negative-antibiotics was discontinued Apparently returned to baseline   Background history of type 2 diabetes, coronary artery disease, hypertension, hyperlipidemia, chronic obstructive pulm disease chronic kidney disease stage III   Lives independently  Pertinent  Medical History   Past Medical History:  Diagnosis Date   Anemia    iron insufion on 05/2016    Atypical chest pain    Atypical CP--stress test, cor angio, and CT chest negative in 2005. Nuclear study normal in 2011    COPD (chronic obstructive pulmonary disease) (HCC)    Diabetes (HCC)    Diabetes type 2- 1/13- metformin started   Dyslipidemia    Endometriosis    with ovarian radiation in 1966, gyn Dr cousins   GERD (gastroesophageal reflux disease)    H/H   Herpes    herpes of the left eye, Dr gust optho   History of DVT (deep vein thrombosis)    on OCP   History of kidney stones    Hypertension    Mild anemia    WBC mild high, lab 2012/ HEMATOLOGY    Osteoarthritis    Osteoarthritis of the hip, knee, and hand, vicodin , prn   Osteopenia    BD in 2013   Tobacco use    quit 03/2008   Vitamin D deficiency     Significant Hospital Events: Including procedures, antibiotic start and stop dates in addition to other pertinent events   Admitted 6/23 Code stroke 6/26,  received TNKase  6/26 CT head with no acute intracranial abnormality, moderate microvascular changes, remote lacunar infarct 6/26 CT angio head and neck with contrast, focal severe proximal right P2 stenosis, moderate stenosis at the origin of the vertebral artery  Interim History / Subjective:  No significant overnight events Having some headache and chronic back pain  Objective    Blood pressure (!) 165/66, pulse 86, temperature 98.2 F (36.8 C), temperature source Axillary, resp. rate (!) 22, height 5' 2 (1.575 m), weight 89.6 kg, SpO2 95%.        Intake/Output Summary (Last 24 hours) at 02/06/2024 0841 Last data filed at 02/06/2024 0700 Gross per 24 hour  Intake 769.83 ml  Output 700 ml  Net 69.83 ml   Filed Weights   02/03/24 1519  Weight: 89.6 kg    Examination: General: Elderly, does not appear to be in distress HENT: Moist oral mucosa Lungs: Clear breath sounds Cardiovascular: S1-S2 appreciated Neuro: Left-sided weakness, able to squeeze fingers, not able to move arm, able to wiggle toes and lift left leg slightly off the bed GU:   I reviewed last 24 h vitals and pain scores, last 48 h intake and output, last 24 h labs and trends, and last 24 h imaging results.  MRI with pontine infarct  Resolved problem list   Assessment and Plan   Stroke Assessment - MRI with pontine infarct - Post TNK - Neuromonitoring - Neuroprotective  measures- normothermia, euglycemia, HOB greater than 30, head in neutral alignment, normocapnia, normoxia - Stroke team continues to follow  -Speech eval - On p.o. diet  Initial concern for UTI/pneumonia - Antibiotics discontinued with patient rapidly improving and back to baseline  Chronic obstructive pulmonary disease - Continue inhalers  History of hypertension - On hydrochlorothiazide , losartan   Type 2 diabetes - Continue SSI  DVT prophylaxis with SCDs  Stable to be transferred  Best Practice (right click and Reselect  all SmartList Selections daily)   Diet/type: dysphagia diet (see orders) DVT prophylaxis SCD Pressure ulcer(s): N/A GI prophylaxis: N/A Lines: N/A Foley:  N/A Code Status:  full code Last date of multidisciplinary goals of care discussion [pending]  Labs   CBC: Recent Labs  Lab 02/02/24 1603 02/03/24 0418 02/04/24 0312 02/05/24 0448 02/06/24 0635  WBC 17.7* 15.8* 17.8* 13.6* 11.6*  NEUTROABS  --   --  14.6* 10.1* 8.6*  HGB 12.9 12.8 11.8* 12.2 11.8*  HCT 43.5 43.4 39.8 41.0 40.5  MCV 85.8 85.3 84.5 85.2 84.9  PLT 289 266 283 270 216    Basic Metabolic Panel: Recent Labs  Lab 02/02/24 1603 02/03/24 0418 02/04/24 0312 02/05/24 0448 02/06/24 0635  NA 138 135 138 138 140  K 4.8 4.5 4.2 3.9 3.7  CL 101 103 106 106 104  CO2 22 22 25  21* 24  GLUCOSE 202* 265* 145* 123* 137*  BUN 17 19 25* 23 19  CREATININE 1.05* 1.08* 0.90 0.96 0.88  CALCIUM  9.1 8.6* 8.4* 8.9 8.8*  MG  --   --  2.1 2.0 2.2   GFR: Estimated Creatinine Clearance: 52.2 mL/min (by C-G formula based on SCr of 0.88 mg/dL). Recent Labs  Lab 02/03/24 0418 02/04/24 0312 02/05/24 0448 02/06/24 0635  PROCALCITON <0.10  --   --   --   WBC 15.8* 17.8* 13.6* 11.6*    Liver Function Tests: Recent Labs  Lab 02/03/24 0418  AST 14*  ALT 8  ALKPHOS 98  BILITOT 0.6  PROT 6.9  ALBUMIN 3.2*   No results for input(s): LIPASE, AMYLASE in the last 168 hours. Recent Labs  Lab 02/04/24 0312  AMMONIA 45*    ABG No results found for: PHART, PCO2ART, PO2ART, HCO3, TCO2, ACIDBASEDEF, O2SAT   Coagulation Profile: No results for input(s): INR, PROTIME in the last 168 hours.  Cardiac Enzymes: No results for input(s): CKTOTAL, CKMB, CKMBINDEX, TROPONINI in the last 168 hours.  HbA1C: Hgb A1c MFr Bld  Date/Time Value Ref Range Status  02/03/2024 04:18 AM 9.2 (H) 4.8 - 5.6 % Final    Comment:    (NOTE) Diagnosis of Diabetes The following HbA1c ranges recommended by the  American Diabetes Association (ADA) may be used as an aid in the diagnosis of diabetes mellitus.  Hemoglobin             Suggested A1C NGSP%              Diagnosis  <5.7                   Non Diabetic  5.7-6.4                Pre-Diabetic  >6.4                   Diabetic  <7.0                   Glycemic control for  adults with diabetes.      CBG: Recent Labs  Lab 02/05/24 1604 02/05/24 1940 02/05/24 2326 02/06/24 0329 02/06/24 0750  GLUCAP 129* 118* 124* 114* 128*    Review of Systems:   Some headache and back pain  Past Medical History:  She,  has a past medical history of Anemia, Atypical chest pain, COPD (chronic obstructive pulmonary disease) (HCC), Diabetes (HCC), Dyslipidemia, Endometriosis, GERD (gastroesophageal reflux disease), Herpes, History of DVT (deep vein thrombosis), History of kidney stones, Hypertension, Mild anemia, Osteoarthritis, Osteopenia, Tobacco use, and Vitamin D deficiency.   Surgical History:   Past Surgical History:  Procedure Laterality Date   APPENDECTOMY     BALLOON DILATION N/A 07/09/2016   Procedure: BALLOON DILATION;  Surgeon: Gladis MARLA Louder, MD;  Location: [facility];  Service: Endoscopy;  Laterality: N/A;   COLONOSCOPY WITH PROPOFOL  N/A 07/09/2016   Procedure: COLONOSCOPY WITH PROPOFOL ;  Surgeon: Gladis MARLA Louder, MD;  Location: [facility];  Service: Endoscopy;  Laterality: N/A;   ESOPHAGOGASTRODUODENOSCOPY N/A 07/09/2016   Procedure: ESOPHAGOGASTRODUODENOSCOPY (EGD);  Surgeon: Gladis MARLA Louder, MD;  Location: [facility];  Service: Endoscopy;  Laterality: N/A;   EYE SURGERY     left eye cataract srugery    HERNIA REPAIR     surgery fro endometriosis     TONSILLECTOMY       Social History:   reports that she has quit smoking. She has never used smokeless tobacco. She reports that she does not drink alcohol and does not use drugs.   Family History:  Her family history includes Heart disease in  her brother and brother; Hypertension in her mother.   Allergies Allergies  Allergen Reactions   Aspirin Swelling and Rash    Throat swells.   Bee Venom Anaphylaxis   Ibuprofen Shortness Of Breath and Swelling    Throat swells.   Percocet [Oxycodone-Acetaminophen ] Itching    Jennet Epley, MD [facility] Pager: See Tracey

## 2024-02-06 NOTE — TOC Initial Note (Signed)
 Transition of Care [facility]) - Initial/Assessment Note    Patient Details  Name: Christina Bennett MRN: [id number] Date of Birth: 1941/02/06  Transition [facility]) CM/SW Contact:    Inocente GORMAN Kindle, LCSW Phone Number: 02/06/2024, 9:16 AM  Clinical Narrative:                 Patient admitted from home alone with stroke like symptoms. TOC following for needs after therapy evaluations when appropriate.     Barriers to Discharge: Continued Medical Work up   Patient Goals and CMS Choice            Expected Discharge Plan and Services In-house Referral: Clinical Social Work     Living arrangements for the past 2 months: Single Family Home                                      Prior Living Arrangements/Services Living arrangements for the past 2 months: Single Family Home Lives with:: Self Patient language and need for interpreter reviewed:: Yes Do you feel safe going back to the place where you live?: Yes      Need for Family Participation in Patient Care: Yes (Comment) Care giver support system in place?: No (comment)   Criminal Activity/Legal Involvement Pertinent to Current Situation/Hospitalization: No - Comment as needed  Activities of Daily Living   ADL Screening (condition at time of admission) Independently performs ADLs?: Yes (appropriate for developmental age) Is the patient deaf or have difficulty hearing?: No Does the patient have difficulty seeing, even when wearing glasses/contacts?: Yes Does the patient have difficulty concentrating, remembering, or making decisions?: Yes  Permission Sought/Granted Permission sought to share information with : Facility Medical sales representative, Family Supports Permission granted to share information with : Yes, Verbal Permission Granted  Share Information with NAME: [name]     Permission granted to share info w Relationship: Niece  Permission granted to share info w Contact Information: [phone]  Emotional  Assessment Appearance:: Appears stated age     Orientation: : Oriented to Self, Oriented to Place, Oriented to  Time, Oriented to Situation Alcohol / Substance Use: Not Applicable Psych Involvement: No (comment)  Admission diagnosis:  Altered mental status [R41.82] Acute cystitis without hematuria [N30.00] Generalized weakness [R53.1] Patient Active Problem List   Diagnosis Date Noted   Altered mental status 02/03/2024   Acute cystitis 02/03/2024   CAP (community acquired pneumonia) 02/03/2024   History of CAD (coronary artery disease) 02/03/2024   Chronic kidney disease (CKD), stage III (moderate) (HCC) 02/03/2024   Essential hypertension 02/03/2024   COPD (chronic obstructive pulmonary disease) (HCC) 09/19/2022   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 [name] [address] Phone: [phone] Fax: [phone]     Social Drivers of Health (SDOH) Social History: SDOH Screenings   Food Insecurity: Patient Unable To Answer (02/03/2024)  Housing: Unknown (02/03/2024)  Transportation Needs: Patient Unable To Answer (02/03/2024)  Utilities: Patient Unable To Answer (02/03/2024)  Social Connections: Patient Unable To Answer (02/03/2024)  Tobacco Use: Medium Risk (02/03/2024)   SDOH Interventions:     Readmission Risk Interventions     No data to display

## 2024-02-06 NOTE — Progress Notes (Addendum)
 STROKE TEAM PROGRESS NOTE    SIGNIFICANT HOSPITAL EVENTS 6/24-patient admitted with headache, generalized weakness, decreased appetite and blurred vision 6/26-inpatient code stroke called for left-sided weakness and patient given TNK 6/27- transfer out of ICU  INTERIM HISTORY/SUBJECTIVE Transfer out of ICU today. PT/OT pending Strength is slightly improved in comparison to yesterday but still has weakness on the left.   OBJECTIVE  CBC    Component Value Date/Time   WBC 11.6 (H) 02/06/2024 0635   RBC 4.77 02/06/2024 0635   HGB 11.8 (L) 02/06/2024 0635   HGB 12.7 09/19/2022 1040   HGB 11.1 (L) 03/05/2017 0856   HCT 40.5 02/06/2024 0635   HCT 35.4 03/05/2017 0856   PLT 216 02/06/2024 0635   PLT 237 09/19/2022 1040   PLT 196 03/05/2017 0856   MCV 84.9 02/06/2024 0635   MCV 86.7 03/05/2017 0856   MCH 24.7 (L) 02/06/2024 0635   MCHC 29.1 (L) 02/06/2024 0635   RDW 16.9 (H) 02/06/2024 0635   RDW 18.2 (H) 03/05/2017 0856   LYMPHSABS 1.7 02/06/2024 0635   LYMPHSABS 1.5 03/05/2017 0856   MONOABS 1.0 02/06/2024 0635   MONOABS 1.0 (H) 03/05/2017 0856   EOSABS 0.2 02/06/2024 0635   EOSABS 0.2 03/05/2017 0856   BASOSABS 0.0 02/06/2024 0635   BASOSABS 0.1 03/05/2017 0856    BMET    Component Value Date/Time   NA 140 02/06/2024 0635   NA 141 05/02/2015 0952   K 3.7 02/06/2024 0635   K 4.2 05/02/2015 0952   CL 104 02/06/2024 0635   CL 104 04/23/2012 1017   CO2 24 02/06/2024 0635   CO2 28 05/02/2015 0952   GLUCOSE 137 (H) 02/06/2024 0635   GLUCOSE 103 05/02/2015 0952   GLUCOSE 121 (H) 04/23/2012 1017   BUN 19 02/06/2024 0635   BUN 13.0 05/02/2015 0952   CREATININE 0.88 02/06/2024 0635   CREATININE 0.7 05/02/2015 0952   CALCIUM  8.8 (L) 02/06/2024 0635   CALCIUM  9.4 05/02/2015 0952   EGFR 82 (L) 05/02/2015 0952   GFRNONAA >60 02/06/2024 0635    IMAGING past 24 hours MR BRAIN WO CONTRAST Result Date: 02/06/2024 CLINICAL DATA:  Follow-up examination for stroke. EXAM: MRI  HEAD WITHOUT CONTRAST TECHNIQUE: Multiplanar, multiecho pulse sequences of the brain and surrounding structures were obtained without intravenous contrast. COMPARISON:  Comparison made with prior CTs from 02/05/2024. FINDINGS: Brain: Cerebral volume within normal limits. Patchy T2/FLAIR hyperintensity involving the periventricular deep white matter both cerebral hemispheres, consistent chronic small vessel ischemic disease, moderate in nature. Small remote lacunar infarct present at the right frontal corona radiata. Focus of restricted diffusion measuring 1.7 x 0.8 x 1.1 cm seen involving the right pons, consistent with an acute ischemic infarct (series 5, image 68). No associated hemorrhage or mass effect. No other evidence for acute or subacute ischemia. Gray-white matter differentiation otherwise maintained. No areas of chronic cortical infarction. No acute or significant chronic intracranial blood products. No mass lesion, midline shift or mass effect. No hydrocephalus or extra-axial fluid collection. Pituitary gland within normal limits. Vascular: Major intracranial vascular flow voids are maintained. Skull and upper cervical spine: Craniocervical junction within normal limits. Bone marrow signal intensity overall within normal limits. No scalp soft tissue abnormality. Sinuses/Orbits: Prior bilateral ocular lens replacement. Paranasal sinuses are largely clear. Trace right mastoid effusion noted, of doubtful significance. Other: None. IMPRESSION: 1. 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 2. Underlying moderate chronic microvascular ischemic disease with small remote lacunar infarct at the right frontal  corona radiata. Electronically Signed   By: Morene Hoard M.D.   On: 02/06/2024 04:55   DG Swallowing Func-Speech Pathology Result Date: 02/05/2024 Table formatting from the original result was not included. Modified Barium Swallow Study Patient Details Name: Christina Bennett MRN:  [id number] Date of Birth: Jun 06, 1941 Today's Date: 02/05/2024 HPI/PMH: HPI: Patient is an 83 y.o. female with PMH: CAD, COPD, essential HTN, CKD stage III, morbid obesity. She presented to the Ed on 02/03/24 with multiple complaints inclucding generalized weakeness, HA, poor appetite, chest congestion, blurry vision and family reporting change in her speech. ED evaluation found her to be hemodynamically stable but with BP up to 183/86, UA showed evidence of UTI. CT head negative for acute intracranial abnormality or hemorrhage but did show moderate periventricular WM changes likely reflecting the sequela of small vessel ischemia. CXR showed diffuse interstitial opacities, R>L. While in ED, patient became very restless, anxious and fidgety. Despite multiple doses of Ativan  to attempt MRI,, it was canceled due to patient not cooperating. Non-violent restraints placed. SLP swallow evalauation ordered secondary to family reports of swallowing difficulities as well as RN observing patient to not be able to swallow even smaller pills, and coughing with liquids.  MBSS 6/25 with recs for D3/NTL. Pt with code stroke overnight and reassessed 6/26 with change in swallow function. Clinical Impression: Pt has had a change in oral and pharyngeal function since initial MBS on previous date. She has slower lingual transit and mastication, with less lingual control and more oral residue, although residue is mild. She has difficulty getting thickened liquids up via straw. New pharyngeal impairments also include reduced base of tongue retraction and pharyngeal squeeze. Laryngeal vestibule closure and epiglottic inversion are incomplete, although thicker boluses help invert the epiglottis a little more. These pharyngeal deficits, combined with more sluggish oral phase, result in boluses dumping into the larynx and pharynx before the swallow, with aspiration of thin and nectar thick liquids that is sensed but not cleared (PAS 7). This  includes gross aspiration from one small cup sip of nectar thick liquids, that elicited persistent coughing from the pt throughout the remaining duration of the study. Pt had no aspiration with honey thick liquids or solids, but given this persistent coughing, it was hard to challenge her to take larger volumes at a time. Recommend that she resume a PO diet with assistance during intake, starting with Dys 2 (finely chopped) diet and honey thick liquids by cup. DIGEST Swallow Severity Rating*  Safety: 4  Efficiency: 1  Overall Pharyngeal Swallow Severity: 3 1: mild; 2: moderate; 3: severe; 4: profound *The Dynamic Imaging Grade of Swallowing Toxicity is standardized for the head and neck cancer population, however, demonstrates promising clinical applications across populations to standardize the clinical rating of pharyngeal swallow safety and severity. Factors that may increase risk of adverse event in presence of aspiration Noe & Lianne 2021): Factors that may increase risk of adverse event in presence of aspiration Noe & Lianne 2021): Frequent aspiration of large volumes; Aspiration of thick, dense, and/or acidic materials; Weak cough; Reduced cognitive function; Dependence for feeding and/or oral hygiene Recommendations/Plan: Swallowing Evaluation Recommendations Swallowing Evaluation Recommendations Recommendations: PO diet PO Diet Recommendation: Dysphagia 2 (Finely chopped); Moderately thick liquids (Level 3, honey thick) Liquid Administration via: Cup; Spoon Medication Administration: Crushed with puree Supervision: Staff to assist with self-feeding; Full supervision/cueing for swallowing strategies Swallowing strategies  : Slow rate; Small bites/sips Postural changes: Position pt fully upright for meals; Stay upright 30-60 min  after meals Oral care recommendations: Oral care BID (2x/day) Caregiver Recommendations: Avoid jello, ice cream, thin soups, popsicles; Remove water  pitcher; Have oral  suction available Treatment Plan Treatment Plan Treatment recommendations: Therapy as outlined in treatment plan below Follow-up recommendations: Home health SLP Functional status assessment: Patient has had a recent decline in their functional status and demonstrates the ability to make significant improvements in function in a reasonable and predictable amount of time. Treatment frequency: Min 2x/week Treatment duration: 2 weeks Interventions: Aspiration precaution training; Patient/family education; Trials of upgraded texture/liquids; Diet toleration management by SLP; Oropharyngeal exercises Recommendations Recommendations for follow up therapy are one component of a multi-disciplinary discharge planning process, led by the attending physician.  Recommendations may be updated based on patient status, additional functional criteria and insurance authorization. Assessment: Orofacial Exam: Orofacial Exam Oral Cavity: Oral Hygiene: WFL Oral Cavity - Dentition: Dentures, top; Missing dentition; Poor condition (lower arch) Orofacial Anatomy: WFL Oral Motor/Sensory Function: WFL Anatomy: Anatomy: Suspected cervical osteophytes Boluses Administered: Boluses Administered Boluses Administered: Thin liquids (Level 0); Mildly thick liquids (Level 2, nectar thick); Moderately thick liquids (Level 3, honey thick); Puree; Solid  Oral Impairment Domain: Oral Impairment Domain Lip Closure: Interlabial escape, no progression to anterior lip Tongue control during bolus hold: Escape to lateral buccal cavity/floor of mouth Bolus preparation/mastication: Slow prolonged chewing/mashing with complete recollection Bolus transport/lingual motion: Slow tongue motion Oral residue: Residue collection on oral structures Location of oral residue : Floor of mouth; Tongue; Palate Initiation of pharyngeal swallow : Pyriform sinuses  Pharyngeal Impairment Domain: Pharyngeal Impairment Domain Soft palate elevation: No bolus between soft palate  (SP)/pharyngeal wall (PW) Laryngeal elevation: Complete superior movement of thyroid  cartilage with complete approximation of arytenoids to epiglottic petiole Anterior hyoid excursion: Partial anterior movement Epiglottic movement: Partial inversion Laryngeal vestibule closure: Incomplete, narrow column air/contrast in laryngeal vestibule Pharyngeal stripping wave : Present - diminished Pharyngeal contraction (A/P view only): N/A Pharyngoesophageal segment opening: Partial distention/partial duration, partial obstruction of flow Tongue base retraction: Narrow column of contrast or air between tongue base and PPW Pharyngeal residue: Collection of residue within or on pharyngeal structures Location of pharyngeal residue: Valleculae; Tongue base  Esophageal Impairment Domain: Esophageal Impairment Domain Esophageal clearance upright position: Esophageal retention Pill: Pill Consistency administered: Puree Puree: WFL Penetration/Aspiration Scale Score: Penetration/Aspiration Scale Score 1.  Material does not enter airway: Moderately thick liquids (Level 3, honey thick); Puree; Solid 3.  Material enters airway, remains ABOVE vocal cords and not ejected out: Mildly thick liquids (Level 2, nectar thick) 5.  Material enters airway, CONTACTS cords and not ejected out: Mildly thick liquids (Level 2, nectar thick) 7.  Material enters airway, passes BELOW cords and not ejected out despite cough attempt by patient: Thin liquids (Level 0); Mildly thick liquids (Level 2, nectar thick) Compensatory Strategies: Compensatory Strategies Compensatory strategies: No Effortful swallow: Ineffective Ineffective Effortful Swallow: Thin liquid (Level 0) Chin tuck: Effective Effective Chin Tuck: Thin liquid (Level 0) Ineffective Chin Tuck: Thin liquid (Level 0)   General Information: Caregiver present: Yes Banker)  Diet Prior to this Study: NPO   Temperature : Normal   Respiratory Status: WFL   Supplemental O2: Nasal cannula   History of Recent  Intubation: No  Behavior/Cognition: Alert; Requires cueing; Distractible Self-Feeding Abilities: Needs assist with self-feeding Baseline vocal quality/speech: Dysphonic Volitional Cough: Able to elicit Volitional Swallow: Able to elicit Exam Limitations: Poor bolus acceptance (limited acceptance due to coughing episodes) Goal Planning: Prognosis for improved oropharyngeal function: Fair Barriers to Reach Goals:  Time post onset; Cognitive deficits No data recorded Patient/Family Stated Goal: not stated; RN reports requests for food and drink Consulted and agree with results and recommendations: Patient; Nurse Pain: Pain Assessment Pain Assessment: Faces Pain Score: 0 Faces Pain Scale: 0 Facial Expression: 0 Body Movements: 0 Muscle Tension: 0 Compliance with ventilator (intubated pts.): N/A Vocalization (extubated pts.): 0 CPOT Total: 0 End of Session: Start Time:SLP Start Time (ACUTE ONLY): 1453 Stop Time: SLP Stop Time (ACUTE ONLY): 1519 Time Calculation:SLP Time Calculation (min) (ACUTE ONLY): 26 min Charges: SLP Evaluations $ SLP Speech Visit: 1 Visit SLP Evaluations $BSS Swallow: 1 Procedure $MBS Swallow: 1 Procedure $ SLP EVAL LANGUAGE/SOUND PRODUCTION: 1 Procedure SLP visit diagnosis: SLP Visit Diagnosis: Dysphagia, oropharyngeal phase (R13.12) Past Medical History: Past Medical History: Diagnosis Date  Anemia   iron insufion on 05/2016   Atypical chest pain   Atypical CP--stress test, cor angio, and CT chest negative in 2005. 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  History of DVT (deep vein thrombosis)   on OCP  History of kidney stones   Hypertension   Mild anemia   WBC mild high, lab 2012/ HEMATOLOGY   Osteoarthritis   Osteoarthritis  of the hip, knee, and hand, vicodin , prn  Osteopenia   BD in 2013  Tobacco use   quit 03/2008  Vitamin D deficiency  Past Surgical History: Past Surgical History: Procedure Laterality Date  APPENDECTOMY    BALLOON DILATION N/A 07/09/2016  Procedure: BALLOON DILATION;  Surgeon: Gladis MARLA Louder, MD;  Location: [facility];  Service: Endoscopy;  Laterality: N/A;  COLONOSCOPY WITH PROPOFOL  N/A 07/09/2016  Procedure: COLONOSCOPY WITH PROPOFOL ;  Surgeon: Gladis MARLA Louder, MD;  Location: [facility];  Service: Endoscopy;  Laterality: N/A;  ESOPHAGOGASTRODUODENOSCOPY N/A 07/09/2016  Procedure: ESOPHAGOGASTRODUODENOSCOPY (EGD);  Surgeon: Gladis MARLA Louder, MD;  Location: [facility];  Service: Endoscopy;  Laterality: N/A;  EYE SURGERY    left eye cataract srugery   HERNIA REPAIR    surgery fro endometriosis    TONSILLECTOMY   Leita SAILOR., M.A. CCC-SLP Acute Rehabilitation Services Office: [phone] Secure chat preferred 02/05/2024, 3:59 PM  ECHOCARDIOGRAM COMPLETE Result Date: 02/05/2024    ECHOCARDIOGRAM REPORT   Patient Name:   SHADAE REINO Graul Date of Exam: 02/05/2024 Medical Rec #:  [id number]        Height:       62.0 in Accession #:    [id number]       Weight:       197.5 lb Date of Birth:  [birth year]        BSA:          1.902 m Patient Age:    83 years         BP:           145/62 mmHg Patient Gender: F                HR:           88 bpm. Exam Location:  Inpatient Procedure: 2D Echo, Intracardiac Opacification Agent, Cardiac Doppler and Color            Doppler (Both Spectral and Color Flow Doppler were utilized  during            procedure). Indications:    stroke  History:        Patient has prior history of Echocardiogram examinations, most                 recent 09/14/2020.  Sonographer:    Therisa Crouch Referring Phys: [id number] SRISHTI L BHAGAT IMPRESSIONS  1. Left ventricular ejection fraction, by estimation, is 55 to 60%. The left ventricle has normal function. The left ventricle has no regional wall motion  abnormalities. Left ventricular diastolic parameters are consistent with Grade I diastolic dysfunction (impaired relaxation).  2. Right ventricular systolic function is normal. The right ventricular size is normal.  3. The mitral valve is normal in structure. No evidence of mitral valve regurgitation. No evidence of mitral stenosis.  4. The aortic valve is normal in structure. Aortic valve regurgitation is not visualized. No aortic stenosis is present.  5. The inferior vena cava is normal in size with greater than 50% respiratory variability, suggesting right atrial pressure of 3 mmHg. FINDINGS  Left Ventricle: Left ventricular ejection fraction, by estimation, is 55 to 60%. The left ventricle has normal function. The left ventricle has no regional wall motion abnormalities. The left ventricular internal cavity size was normal in size. There is  no left ventricular hypertrophy. Left ventricular diastolic parameters are consistent with Grade I diastolic dysfunction (impaired relaxation). Right Ventricle: The right ventricular size is normal. No increase in right ventricular wall thickness. Right ventricular systolic function is normal. Left Atrium: Left atrial size was normal in size. Right Atrium: Right atrial size was normal in size. Pericardium: There is no evidence of pericardial effusion. Mitral Valve: The mitral valve is normal in structure. No evidence of mitral valve regurgitation. No evidence of mitral valve stenosis. Tricuspid Valve: The tricuspid valve is normal in structure. Tricuspid valve regurgitation is not demonstrated. No evidence of tricuspid stenosis. Aortic Valve: The aortic valve is normal in structure. Aortic valve regurgitation is not visualized. No aortic stenosis is present. Pulmonic Valve: The pulmonic valve was normal in structure. Pulmonic valve regurgitation is not visualized. No evidence of pulmonic stenosis. Aorta: The aortic root is normal in size and structure. Venous: The inferior  vena cava is normal in size with greater than 50% respiratory variability, suggesting right atrial pressure of 3 mmHg. IAS/Shunts: No atrial level shunt detected by color flow Doppler.   Diastology LV e' medial:    4.79 cm/s LV E/e' medial:  17.2 LV e' lateral:   7.18 cm/s LV E/e' lateral: 11.5  RIGHT VENTRICLE             IVC RV Basal diam:  2.80 cm     IVC diam: 1.90 cm RV S prime:     14.90 cm/s TAPSE (M-mode): 1.8 cm LEFT ATRIUM             Index LA Vol (A2C):   51.1 ml 26.87 ml/m LA Vol (A4C):   47.8 ml 25.14 ml/m LA Biplane Vol: 52.3 ml 27.50 ml/m  MITRAL VALVE MV Area (PHT): 5.38 cm MV Decel Time: 141 msec MV E velocity: 82.30 cm/s MV A velocity: 108.00 cm/s MV E/A ratio:  0.76 Aditya Sabharwal Electronically signed by Ria Commander Signature Date/Time: 02/05/2024/11:47:44 AM    Final     Vitals:   02/06/24 0500 02/06/24 0600 02/06/24 0700 02/06/24 0800  BP: (!) 154/72 (!) 148/61 (!) 142/63 (!) 165/66  Pulse: 76 81 80 86  Resp: (!) 21 (!) 24 (!) 21 (!) 22  Temp:      TempSrc:      SpO2: 94% 96% 95% 95%  Weight:      Height:         PHYSICAL EXAM General:  Alert, well-nourished, well-developed patient in no acute distress Psych:  Mood and affect appropriate for situation CV: Regular rate and rhythm on monitor Respiratory:  Regular, unlabored respirations on room air GI: Abdomen soft and nontender   NEURO:  Mental Status: AA&Ox3, patient is able to give some history Speech/Language: speech is with mild dysarthria and a stuttering pattern, naming and repetition intact  Cranial Nerves:  II: PERRL. Visual Shallenberger full.  III, IV, VI: EOMI. Eyelids elevate symmetrically.  V: Sensation is intact to light touch and symmetrical to face.  VII: Face is symmetrical resting and smiling VIII: hearing intact to voice. IX, X: Voice is mildly dysarthric XII: tongue is midline without fasciculations. Motor: Able to move right upper and lower extremities with good antigravity  strength LUE 3/5 with drift LLE 4/5 with drift  Tone: is normal and bulk is normal Sensation- Intact to light touch bilaterally.  Coordination: Unable to perform on the left Gait- deferred  Most Recent NIH  1a Level of Conscious.: 0 1b LOC Questions: 0 1c LOC Commands: 0 2 Best Gaze: 0 3 Visual: 0 4 Facial Palsy: 0 5a Motor Arm - left: 2 5b Motor Arm - Right: 0 6a Motor Leg - Left: 1 6b Motor Leg - Right: 0 7 Limb Ataxia: 0 8 Sensory: 0 9 Best Language: 0 10 Dysarthria: 1 11 Extinct. and Inatten.: 0 TOTAL: 4   ASSESSMENT/PLAN  Christina Bennett is a 83 y.o. female with history of CAD, diabetes, hypertension, hyperlipidemia, COPD, CKD stage III keratitis of the left eye originally admitted for generalized weakness, headache, decreased p.o. intake and blurred vision.  In the early morning of 6/26, she had sudden onset left-sided weakness and tingling.  Inpatient code stroke was called, and patient was given TNK.  Stroke: R pontine infarct s/p TNK, etiology likely small vessel disease Code Stroke CT head No acute abnormality. Small vessel disease. A ASPECTS 10.    CTA head & neck no LVO, proximal right P2 stenosis, moderate stenosis at origin of right vertebral artery  MRI 1.7 cm acute ischemic nonhemorrhagic right pontine infarct.  2D Echo EF 55-60% LDL 100 HgbA1c 9.2 VTE prophylaxis - lovenox  No antithrombotic prior to admission, now on plavix 75mg  alone due to documented aspirin allergy  Therapy recommendation SNF Disposition: Pending  Pulmonary edema CAD presented to the ED on 6/23 initially with generalized weakness, headache, poor appetite, chest congestion, blurry vision and slurred speech.  Was started on antibiotics for presumed UTI/pneumonia. However ultimately procalcitonin was negative and she was felt to have perhaps some pulmonary edema so antibiotics were discontinued.   Hypertension Home meds: Losartan  100 mg daily, hydrochlorothiazide  25 mg  daily Stable On home losartan   Blood Pressure Goal: BP less than 180/105 post TNK  Hyperlipidemia Home meds: Simvastatin  40 mg daily daily LDL 100, goal < 70 changed to atorvastatin  40 mg Continue statin at discharge  Diabetes type II poorly controlled Home meds: Glimepiride 2 mg twice daily HgbA1c 9.2, goal < 7.0 CBGs, SSI Recommend close follow-up with PCP for better DM control  Dysphagia Patient has post-stroke dysphagia SLP consulted Now dys 2 with honey thick On gentle hydration IVF Advance diet as tolerated  Other Stroke Risk  Factors Obesity, Body mass index is 36.13 kg/m., BMI >/= 30 associated with increased stroke risk, recommend weight loss, diet and exercise as appropriate  Advanced age  Other Active Problems COPD  Hospital day # 3  Patient seen and examined by NP/APP with MD. MD to update note as needed.   Jorene Last, DNP, FNP-BC [facility] Pager: [phone]  ATTENDING NOTE: I reviewed above note and agree with the assessment and plan. Pt was seen and examined.   No family bedside.  Patient lying in bed, some improvement in dysarthria and left lower extremity strength.  Still has weakness left upper extremity. Awake, alert, eyes open, orientated to age, place, time. No aphasia, paucity of speech but no more stuttering, mild to moderate dysarthria, following all simple commands. Able to name and repeat in dysarthric voice. No gaze palsy, tracking bilaterally, visual field full. No significant facial droop. Tongue midline. LUE 1/5 and LLE 3/5 proximal and distally. RUE and RLE 4/5. Sensation symmetrical bilaterally, right FTN intact, gait not tested.   MRI showed right pontine infarct, small vessel disease given risk factor.  Currently on Plavix and statin.  Patient had a choking with fluid, speech on board, continue dysphagia 2 and honey thick liquid.  Put on gentle hydration with normal saline.  PT and OT recommend SNF.  For detailed  assessment and plan, please refer to above as I have made changes wherever appropriate.   Neurology will sign off. Please call with questions. Pt will follow up with stroke clinic NP at [facility] in about 4 weeks. Thanks for the consult.   Ary Cummins, MD PhD Stroke Neurology 02/06/2024 4:57 PM     To contact Stroke Continuity provider, please refer to [email or web address]. After hours, contact General Neurology

## 2024-02-06 NOTE — Evaluation (Signed)
 Physical Therapy Evaluation Patient Details Name: Christina Bennett MRN: [id number] DOB: 02-18-1941 Today's Date: 02/06/2024  History of Present Illness  The pt is an 83 yo female presenting 6/23 with AMS. Initial work up with concern for UTI, pt given antibiotics and returned to baseline. Code stroke called 1AM 6/26, pt given TNK and transferred to ICU. 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 PMH includes: COPD, DM II, DVT, HTN, arthritis.   Clinical Impression  Pt in bed upon arrival of PT, agreeable to evaluation at this time. Prior to admission the pt was independent with all mobility, living alone, and reports intermittent use of SPC. The pt reports 2 near falls but was able to correct LOB. The pt presents today with decreased strength in LLE and LUE (no active movement noted in LUE), and decreased clarity of speech. The pt required modA of 2 to complete bed mobility, and mod-maxA of 2 to complete sit-stand and squat-pivot transfers. She demos poor functional strength in her LLE, needing assist to prevent buckling and assist/cues at hips and trunk to extend. The pt reports dizziness throughout session without change in BP. Given limited family assist available after d/c, recommend continued inpatient therapies <3hours/day to facilitate return to maximal level of independence.       If plan is discharge home, recommend the following: Two people to help with walking and/or transfers;Two people to help with bathing/dressing/bathroom;Assistance with cooking/housework;Direct supervision/assist for medications management;Direct supervision/assist for financial management;Assist for transportation;Help with stairs or ramp for entrance   Can travel by private vehicle   No    Equipment Recommendations Wheelchair (measurements PT);Wheelchair cushion (measurements PT);Hoyer lift  Recommendations for  [employer]       Functional Status Assessment Patient has had a recent decline in their functional status and demonstrates the ability to make significant improvements in function in a reasonable and predictable amount of time.     Precautions / Restrictions Precautions Precautions: Fall Recall of Precautions/Restrictions: Impaired Precaution/Restrictions Comments: SBP <180 Restrictions Weight Bearing Restrictions Per Provider Order: No      Mobility  Bed Mobility Overal bed mobility: Needs Assistance Bed Mobility: Rolling, Supine to Sit Rolling: Max assist   Supine to sit: Mod assist, +2 for physical assistance     General bed mobility comments: modA to manage LLE and to elevate trunk, pt assisting with RUE    Transfers Overall transfer level: Needs assistance Equipment used: 2 person hand held assist Transfers: Sit to/from Stand, Bed to chair/wheelchair/BSC Sit to Stand: Mod assist, +2 physical assistance     Squat pivot transfers: Max assist, +2 physical assistance     General transfer comment: modA with more assist on L side to prevent buckling with initial stand, cues for hip extension and trunk upright. maxA to pivot to R    Ambulation/Gait               General Gait Details: pt unable     Modified Rankin (Stroke Patients Only) Modified Rankin (Stroke Patients Only) Pre-Morbid Rankin Score: No symptoms Modified Rankin: Severe disability     Balance Overall balance assessment: Needs assistance Sitting-balance support: Feet supported, Single extremity supported Sitting balance-Leahy Scale: Poor   Postural control: Posterior lean Standing balance support: Bilateral upper extremity supported Standing balance-Leahy Scale: Poor Standing balance comment: dependent on assist of 2, L knee buckling  Pertinent Vitals/Pain Pain Assessment Pain Assessment: Faces Faces Pain Scale: Hurts little more Pain  Location: Headache Pain Descriptors / Indicators: Aching Pain Intervention(s): Limited activity within patient's tolerance, Monitored during session, Repositioned    Home Living Family/patient expects to be discharged to:: Private residence Living Arrangements: Alone Available Help at Discharge: Family;Available PRN/intermittently Type of Home: House Home Access: Stairs to enter Entrance Stairs-Rails: None Entrance Stairs-Number of Steps: 2   Home Layout: One level Home Equipment: Cane - single point      Prior Function Prior Level of Function : Independent/Modified Independent;Driving             Mobility Comments: pt reports intermittent use of SPC, 2 near falls due to dizziness, largely independent ADLs Comments: Patient performs light meal prep, manages her medications.  Able to bathe and dress herself.  Continues to drive locally.     Extremity/Trunk Assessment   Upper Extremity Assessment Upper Extremity Assessment: Defer to OT evaluation LUE Deficits / Details: flaccid LUE Sensation: WNL LUE Coordination: decreased fine motor;decreased gross motor    Lower Extremity Assessment Lower Extremity Assessment: LLE deficits/detail LLE Deficits / Details: limited ROM against gravity at knee, and hip. reports sensation intact. grossly 2+/5 LLE Sensation: WNL LLE Coordination: decreased fine motor;decreased gross motor    Cervical / Trunk Assessment Cervical / Trunk Assessment: Kyphotic  Communication   Communication Communication: Impaired Factors Affecting Communication: Difficulty expressing self;Reduced clarity of speech    Cognition Arousal: Alert Behavior During Therapy: Flat affect, Lability   PT - Cognitive impairments: No apparent impairments                         Following commands: Intact       Cueing Cueing Techniques: Verbal cues, Gestural cues     General Comments General comments (skin integrity, edema, etc.): SpO2 to 89% on RA,  returned to 2L    Exercises     Assessment/Plan    PT Assessment Patient needs continued PT services  PT Problem List Decreased strength;Decreased range of motion;Decreased activity tolerance;Decreased balance;Decreased mobility       PT Treatment Interventions DME instruction;Gait training;Stair training;Functional mobility training;Therapeutic activities;Therapeutic exercise;Balance training;Patient/family education    PT Goals (Current goals can be found in the Care Plan section)  Acute Rehab PT Goals Patient Stated Goal: return to living at her house PT Goal Formulation: With patient Time For Goal Achievement: 02/20/24 Potential to Achieve Goals: Good    Frequency Min 2X/week     Co-evaluation   Reason for Co-Treatment: Complexity of the patient's impairments (multi-system involvement);For patient/therapist safety;To address functional/ADL transfers PT goals addressed during session: Mobility/safety with mobility;Balance;Strengthening/ROM OT goals addressed during session: ADL's and self-care       AM-PAC PT 6 Clicks Mobility  Outcome Measure Help needed turning from your back to your side while in a flat bed without using bedrails?: A Lot Help needed moving from lying on your back to sitting on the side of a flat bed without using bedrails?: A Lot Help needed moving to and from a bed to a chair (including a wheelchair)?: Total Help needed standing up from a chair using your arms (e.g., wheelchair or bedside chair)?: Total Help needed to walk in hospital room?: Total Help needed climbing 3-5 steps with a railing? : Total 6 Click Score: 8    End of Session Equipment Utilized During Treatment: Gait belt;Oxygen Activity Tolerance: Patient tolerated treatment well Patient left: in chair;with call bell/phone  within reach;with chair alarm set Nurse Communication: Mobility status;Need for lift equipment PT Visit Diagnosis: Unsteadiness on feet (R26.81);Muscle weakness  (generalized) (M62.81);Hemiplegia and hemiparesis Hemiplegia - Right/Left: Left Hemiplegia - dominant/non-dominant: Non-dominant Hemiplegia - caused by: Cerebral infarction    Time: 1419-1440 PT Time Calculation (min) (ACUTE ONLY): 21 min   Charges:   PT Evaluation $PT Eval Moderate Complexity: 1 Mod   PT General Charges $$ ACUTE PT VISIT: 1 Visit         Izetta Call, PT, DPT   Acute Rehabilitation Department Office [phone] Secure Chat Communication Preferred  Izetta JULIANNA Call 02/06/2024, 4:05 PM

## 2024-02-06 NOTE — Evaluation (Signed)
 Occupational Therapy Evaluation Patient Details Name: Christina Bennett MRN: [id number] DOB: November 11, 1940 Today's Date: 02/06/2024   History of Present Illness   The pt is an 83 yo female presenting 6/23 with AMS. Initial work up with concern for UTI, pt given antibiotics and returned to baseline. Code stroke called 1AM 6/26, pt given TNK and transferred to ICU. 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 PMH includes: COPD, DM II, DVT, HTN, arthritis.     Clinical Impressions Patient admitted 6/23 for blurred vision and dizziness.  Unfortunately she has suffered a CVA here at [facility]. Patient used a SPC on occasion, continued to drive and completed her own ADL and light iADL.  Currently she presents with the deficits below, needing +2 assist for transfers, Mod A for upper body ADL and near total A for lower body ADL at bedlevel.  OT will continue efforts in the acute setting and Patient will benefit from continued inpatient follow up therapy, <3 hours/day.     If plan is discharge home, recommend the following:   Two people to help with walking and/or transfers;A lot of help with bathing/dressing/bathroom;Assistance with feeding;Assistance with cooking/housework     Functional Status Assessment   Patient has had a recent decline in their functional status and demonstrates the ability to make significant improvements in function in a reasonable and predictable amount of time.     Equipment Recommendations   Wheelchair cushion (measurements OT);Wheelchair (measurements OT)     Recommendations for Other Services         Precautions/Restrictions   Precautions Precautions: Fall Restrictions Weight Bearing Restrictions Per Provider Order: No     Mobility Bed Mobility Overal bed mobility: Needs Assistance Bed Mobility: Rolling, Supine to Sit Rolling: Max assist   Supine to sit:  Mod assist, +2 for physical assistance       Patient Response: Cooperative  Transfers Overall transfer level: Needs assistance   Transfers: Sit to/from Stand, Bed to chair/wheelchair/BSC Sit to Stand: Mod assist, +2 physical assistance   Squat pivot transfers: Max assist, +2 physical assistance              Balance Overall balance assessment: Needs assistance Sitting-balance support: Feet supported, Single extremity supported Sitting balance-Leahy Scale: Poor   Postural control: Posterior lean Standing balance support: Bilateral upper extremity supported Standing balance-Leahy Scale: Poor                             ADL either performed or assessed with clinical judgement   ADL Overall ADL's : Needs assistance/impaired Eating/Feeding: Moderate assistance;Bed level   Grooming: Wash/dry hands;Wash/dry face;Moderate assistance;Bed level   Upper Body Bathing: Maximal assistance;Bed level   Lower Body Bathing: Total assistance;Bed level   Upper Body Dressing : Maximal assistance;Sitting   Lower Body Dressing: Total assistance;Bed level;Maximal assistance   Toilet Transfer: Maximal assistance;+2 for physical assistance;Squat-pivot;BSC/3in1   Toileting- Clothing Manipulation and Hygiene: Total assistance;Bed level               Vision Patient Visual Report: No change from baseline Additional Comments: baseline vision impariment to L eye.     Perception Perception: Within Functional Limits       Praxis Praxis: WFL       Pertinent Vitals/Pain Pain Assessment Pain Assessment: Faces Faces Pain Scale: Hurts little more Pain Location: Headache Pain Descriptors /  Indicators: Aching Pain Intervention(s): Monitored during session     Extremity/Trunk Assessment Upper Extremity Assessment Upper Extremity Assessment: Right hand dominant;LUE deficits/detail LUE Deficits / Details: flaccid LUE Sensation: WNL LUE Coordination: decreased fine  motor;decreased gross motor   Lower Extremity Assessment Lower Extremity Assessment: Defer to PT evaluation   Cervical / Trunk Assessment Cervical / Trunk Assessment: Kyphotic   Communication Communication Communication: Impaired Factors Affecting Communication: Difficulty expressing self;Reduced clarity of speech   Cognition Arousal: Alert Behavior During Therapy: Flat affect Cognition: No apparent impairments                               Following commands: Intact       Cueing  General Comments   Cueing Techniques: Verbal cues;Gestural cues   Baseline chronic dizziness.   Exercises     Shoulder Instructions      Home Living Family/patient expects to be discharged to:: Private residence Living Arrangements: Alone   Type of Home: House Home Access: Stairs to enter [employer] of Steps: 2 Entrance Stairs-Rails: None Home Layout: One level     Bathroom Shower/Tub: Tub/shower unit;Walk-in shower   Bathroom Toilet: Standard Bathroom Accessibility: Yes How Accessible: Accessible via walker Home Equipment: Cane - single point          Prior Functioning/Environment Prior Level of Function : Independent/Modified Independent;Driving               ADLs Comments: Patient performs light meal prep, manages her medications.  Able to bathe and dress herself.  Continues to drive locally.    OT Problem List: Decreased strength;Decreased coordination;Pain;Impaired sensation;Decreased range of motion;Decreased activity tolerance;Decreased safety awareness;Impaired balance (sitting and/or standing);Impaired UE functional use   OT Treatment/Interventions: Self-care/ADL training;Therapeutic exercise;Therapeutic activities;Energy conservation;DME and/or AE instruction;Patient/family education;Balance training      OT Goals(Current goals can be found in the care plan section)   Acute Rehab OT Goals Patient Stated Goal: Return home OT Goal  Formulation: With patient Time For Goal Achievement: 02/20/24 Potential to Achieve Goals: Good ADL Goals Pt Will Perform Eating: with set-up;sitting Pt Will Perform Grooming: with set-up;sitting Pt Will Perform Upper Body Dressing: with min assist;sitting Pt Will Perform Lower Body Dressing: with mod assist;sitting/lateral leans Pt Will Transfer to Toilet: with min assist;squat pivot transfer;bedside commode   OT Frequency:  Min 2X/week    Co-evaluation PT/OT/SLP Co-Evaluation/Treatment: Yes Reason for Co-Treatment: Complexity of the patient's impairments (multi-system involvement)   OT goals addressed during session: ADL's and self-care      AM-PAC OT 6 Clicks Daily Activity     Outcome Measure Help from another person eating meals?: A Little Help from another person taking care of personal grooming?: A Lot Help from another person toileting, which includes using toliet, bedpan, or urinal?: A Lot Help from another person bathing (including washing, rinsing, drying)?: A Lot Help from another person to put on and taking off regular upper body clothing?: A Lot Help from another person to put on and taking off regular lower body clothing?: Total 6 Click Score: 12   End of Session Equipment Utilized During Treatment: Gait belt Nurse Communication: Mobility status  Activity Tolerance: Patient tolerated treatment well Patient left: in chair;with call bell/phone within reach;with chair alarm set  OT Visit Diagnosis: Unsteadiness on feet (R26.81);Muscle weakness (generalized) (M62.81);Cognitive communication deficit (R41.841);Hemiplegia and hemiparesis Symptoms and signs involving cognitive functions: Cerebral infarction Hemiplegia - Right/Left: Left Hemiplegia - dominant/non-dominant: Non-Dominant Hemiplegia -  caused by: Cerebral infarction                Time: 1423-1446 OT Time Calculation (min): 23 min Charges:  OT General Charges $OT Visit: 1 Visit OT Evaluation $OT Eval  Moderate Complexity: 1 Mod  02/06/2024  RP, OTR/L  Acute Rehabilitation Services  Office:  [phone]   Christina Bennett 02/06/2024, 3:01 PM

## 2024-02-07 DIAGNOSIS — I639 Cerebral infarction, unspecified: Secondary | ICD-10-CM | POA: Diagnosis present

## 2024-02-07 DIAGNOSIS — I5033 Acute on chronic diastolic (congestive) heart failure: Secondary | ICD-10-CM | POA: Diagnosis present

## 2024-02-07 DIAGNOSIS — J9601 Acute respiratory failure with hypoxia: Secondary | ICD-10-CM | POA: Insufficient documentation

## 2024-02-07 DIAGNOSIS — R1310 Dysphagia, unspecified: Secondary | ICD-10-CM

## 2024-02-07 LAB — BLOOD GAS, VENOUS
Acid-Base Excess: 2.4 mmol/L — ABNORMAL HIGH (ref 0.0–2.0)
Bicarbonate: 28.8 mmol/L — ABNORMAL HIGH (ref 20.0–28.0)
Drawn by: 426281
O2 Saturation: 53.2 %
Patient temperature: 37.2
pCO2, Ven: 51 mmHg (ref 44–60)
pH, Ven: 7.36 (ref 7.25–7.43)
pO2, Ven: 35 mmHg (ref 32–45)

## 2024-02-07 LAB — BASIC METABOLIC PANEL WITH GFR
Anion gap: 11 (ref 5–15)
BUN: 23 mg/dL (ref 8–23)
CO2: 23 mmol/L (ref 22–32)
Calcium: 8.6 mg/dL — ABNORMAL LOW (ref 8.9–10.3)
Chloride: 109 mmol/L (ref 98–111)
Creatinine, Ser: 0.88 mg/dL (ref 0.44–1.00)
GFR, Estimated: >60 mL/min (ref >60)
Glucose, Bld: 115 mg/dL — ABNORMAL HIGH (ref 70–99)
Potassium: 4 mmol/L (ref 3.5–5.1)
Sodium: 143 mmol/L (ref 135–145)

## 2024-02-07 LAB — CBC WITH DIFFERENTIAL/PLATELET
Abs Immature Granulocytes: 0.06 10*3/uL (ref 0.00–0.07)
Basophils Absolute: 0.1 10*3/uL (ref 0.0–0.1)
Basophils Relative: 0 %
Eosinophils Absolute: 0.5 10*3/uL (ref 0.0–0.5)
Eosinophils Relative: 4 %
HCT: 40.7 % (ref 36.0–46.0)
Hemoglobin: 12 g/dL (ref 12.0–15.0)
Immature Granulocytes: 0 %
Lymphocytes Relative: 15 %
Lymphs Abs: 2 10*3/uL (ref 0.7–4.0)
MCH: 25.7 pg — ABNORMAL LOW (ref 26.0–34.0)
MCHC: 29.5 g/dL — ABNORMAL LOW (ref 30.0–36.0)
MCV: 87.2 fL (ref 80.0–100.0)
Monocytes Absolute: 1.1 10*3/uL — ABNORMAL HIGH (ref 0.1–1.0)
Monocytes Relative: 8 %
Neutro Abs: 10 10*3/uL — ABNORMAL HIGH (ref 1.7–7.7)
Neutrophils Relative %: 73 %
Platelets: 216 10*3/uL (ref 150–400)
RBC: 4.67 MIL/uL (ref 3.87–5.11)
RDW: 17.2 % — ABNORMAL HIGH (ref 11.5–15.5)
WBC: 13.7 10*3/uL — ABNORMAL HIGH (ref 4.0–10.5)
nRBC: 0 % (ref 0.0–0.2)

## 2024-02-07 LAB — GLUCOSE, CAPILLARY
Glucose-Capillary: 115 mg/dL — ABNORMAL HIGH (ref 70–99)
Glucose-Capillary: 118 mg/dL — ABNORMAL HIGH (ref 70–99)
Glucose-Capillary: 129 mg/dL — ABNORMAL HIGH (ref 70–99)
Glucose-Capillary: 129 mg/dL — ABNORMAL HIGH (ref 70–99)

## 2024-02-07 LAB — MAGNESIUM: Magnesium: 2.3 mg/dL (ref 1.7–2.4)

## 2024-02-07 MED ORDER — LORAZEPAM 1 MG PO TABS: 1.0000 mg | ORAL_TABLET | Freq: Once | ORAL | Status: DC

## 2024-02-07 MED ORDER — CLOTRIMAZOLE 1 % VA CREA
1.0000 | TOPICAL_CREAM | Freq: Every day | VAGINAL | Status: AC
  Administered 2024-02-07 – 2024-02-13 (×7): 1 via VAGINAL
  Filled 2024-02-07: qty 45

## 2024-02-07 NOTE — Progress Notes (Signed)
  Progress Note   Patient: Christina Bennett FMW:[id number] DOB: 02/14/1941 DOA: 02/02/2024     4 DOS: the patient was seen and examined on 02/07/2024 at 11:23AM      Brief hospital course: 83 y.o. F with COPD not on home O2, obesity, CAD, CKD IIIa baseline 0.8-1.1 who presented with dizziness, blurry vision, HA, and decrased appetite.  CXR in the ER showed bilateral opacities, so she was admitted for pneumonia and possible dCHF flare.  After admission, she developed acute left sided weakness overnight, CODE STROKE called, given TNK and transferred to ICU.     Assessment and Plan: * Acute on chronic diastolic CHF (congestive heart failure) (HCC) Treated with Lasix. Resolved.  Still on 2L. - Repeat CXR tomorrow  Dysphagia - SLP  Acute respiratory failure with hypoxia (HCC)    CVA (cerebral vascular accident) (HCC) MRI brain showed small pontine infarct. - Non-invasive angiography showed focal severe right P2 stenosis and moderate vertebral right stenosis - Echocardiogram showed no cardiogenic source of embolism - Carotid imaging unremarkable   - Lipids ordered: on Lipitor - Aspirin allergic - Continue Plavix - Evaluation for arrhythmia/atrial fibrillation: none on monitoring - tPA given - Dysphagia screen ordered, on dysphagia diet - PT eval ordered: SNF - Nonsmoker     Essential hypertension BP slightly high - Continue losartan   Chronic kidney disease (CKD), stage IIIa (moderate) (HCC) Cr stable relative to baseline  History of CAD (coronary artery disease) Aspirin allergic - Continue Lipitor, Plavix  Acute metabolic encephalopathy Attention decreased again.  Waxing and waning.   - Check VBG  COPD (chronic obstructive pulmonary disease) (HCC) No active flare - Check VBG - Continue Breztri   Non-insulin  dependent type 2 diabetes mellitus (HCC) Uncontrolled.  A1c 9.2% Glucose here mostly controlled - Hold glimepiride - Continue SS corrections  Iron  deficiency anemia Hgb stable  Class 2 obesity        Subjective: Patient is very sleepy.  She is oriented to self, hospital, not date or month.  She is very sluggish, sounds dysarthric.     Physical Exam: BP (!) 157/62 (BP Location: Left Arm)   Pulse 84   Temp 98.3 F (36.8 C) (Oral)   Resp 17   Ht 5' 2 (1.575 m)   Wt 89.6 kg   SpO2 97%   BMI 36.13 kg/m   Adult female, lying in bed, appears sluggish, on nasal cannula RRR, no murmurs, no peripheral edema Respiratory rate normal, lungs clear without rales or wheezes Abdomen soft no tenderness palpation or guarding, no ascites or distention  attention diminshed, affect blunted, judgment and insight appear impaired, weak on right    Data Reviewed: Discussed with neurology Basic metabolic panel normal Magnesium normal CBC mild anemia.     Family Communication:     Disposition: Status is: Inpatient         Author: Lonni SHAUNNA Dalton, MD 02/07/2024 2:59 PM  For on call review [email or web address].

## 2024-02-07 NOTE — Assessment & Plan Note (Signed)
 Hgb stable.

## 2024-02-07 NOTE — Assessment & Plan Note (Addendum)
 No acute exacerbation.  Continue with bronchodilator therapy

## 2024-02-07 NOTE — Assessment & Plan Note (Addendum)
 Echocardiogram with preserved LV systolic function with EF 55 to 60%, RV systolic function preserved, normal size left and right atriums, no significant valvular disease.   Patient has been diuresed with furosemide  Currently with euvolemic state.  Holding diuretic therapy  Continue losartan  for afterload reduction.   Acute hypoxemic respiratory failure due to acute cardiogenic pulmonary edema has resolved.  Ruled out for pneumonia.

## 2024-02-07 NOTE — Assessment & Plan Note (Deleted)
 Attention decreased again.  Waxing and waning.   - Check VBG

## 2024-02-07 NOTE — Assessment & Plan Note (Addendum)
 Overnight after admission, developed acute left sided weakness and dysarthria. MRI brain showed small pontine infarct. Post-stroke course complicated by dysphagia. Non-invasive angiography showed focal severe right P2 stenosis and moderate vertebral right stenosis. Echocardiogram showed no cardiogenic source of embolism. Carotid imaging unremarkable. Continue Lipitor, Plavix , she is allergic to aspirin. PT recommends SNF, placement pending   Acute metabolic encephalopathy, multifactorial, clinically has improved, and back to baseline, encephalopathy has resolved.

## 2024-02-07 NOTE — Assessment & Plan Note (Addendum)
 Has a history of esophageal stricture.  Since her stroke, she has had marked oropharyngeal dysphagia.

## 2024-02-07 NOTE — Hospital Course (Addendum)
 Christina Bennett was admitted to the hospital with the working diagnosis of altered mental status in the setting of acute stroke.   83 y.o. F with COPD, obesity, CAD, CKD IIIa who presented with dizziness, blurry vision, headache, chest congestion, and speech disturbances. EMS was called and patient was brought the the hospital.  On her initial physical examination her blood pressure was 122/63, HR 80, RR 19 and 02 saturation 95%.  Lungs with no wheezing or rhonchi, heart with S1 and S2 present and regular, abdomen with no distention and no lower extremity edema.   Chest radiograph with hypoinflation, mild cardiomegaly with bilateral basal atelectasis.    Was agitated and confused in the ED received multiple medications including Ativan , droperidol , Geodon , Haldol    After admission, she developed acute left sided weakness overnight, CODE STROKE called, given TNK and transferred to ICU.  6/26 CT head with no acute intracranial abnormality, moderate microvascular changes, remote lacunar infarct 6/26 CT angio head and neck with contrast, focal severe proximal right P2 stenosis, moderate stenosis at the origin of the vertebral artery  MRI showed right pontine infarct, small vessel disease given risk factor.  06/27 neurology sign off, recommended continue aspirin and clopidogrel .  06/28 transfer to [facility].  07/10 patient continue very weak and deconditioned, a cortrak was placed for nutritional support. Plan for PEG tube tomorrow, and then transfer to SNF when bed available.

## 2024-02-07 NOTE — Progress Notes (Signed)
 Physical Therapy Treatment Patient Details Name: Christina Bennett MRN: [id number] DOB: 06-30-1941 Today's Date: 02/07/2024   History of Present Illness The pt is an 83 yo female presenting 6/23 with AMS. Initial work up with concern for UTI, pt given antibiotics and returned to baseline. Code stroke called 1AM 6/26, pt given TNK and transferred to ICU. 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 PMH includes: COPD, DM II, DVT, HTN, arthritis.   PT Comments  Pt with decreased alertness in today's session with difficulty keeping eyes open and following simple commands intermittently with increased time. Worked on bed mobility with pt able to roll bilaterally with MaxA. Increased difficulty rolling to the right with pt able to grip hand rail with left hand after PT placed hand on rail. Continues to have L>R weakness with pt able to wiggle L toes and grip L hand. Slight activation noticed in L shoulder musculature with cues for shoulder flexion. Deferred transfer due to obtunded state with pt falling asleep during session. Continue to recommend <3hrs post acute rehab. Acute PT to follow.     If plan is discharge home, recommend the following: Two people to help with walking and/or transfers;Two people to help with bathing/dressing/bathroom;Assistance with cooking/housework;Direct supervision/assist for medications management;Direct supervision/assist for financial management;Assist for transportation;Help with stairs or ramp for entrance   Can travel by private vehicle     No  Equipment Recommendations  Wheelchair (measurements PT);Wheelchair cushion (measurements PT);Hoyer lift       Precautions / Restrictions Precautions Precautions: Fall Recall of Precautions/Restrictions: Impaired Precaution/Restrictions Comments: SBP <180 Restrictions Weight Bearing Restrictions Per Provider Order: No      Mobility  Bed Mobility Overal bed mobility: Needs Assistance Bed Mobility: Rolling Rolling: Max assist, Used rails    General bed mobility comments: worked on rolling bilaterally with increased difficulty rolling to the left. Able to grip hand rail when placing L hand    Transfers    General transfer comment: deferred due to LOA and need for +2    Modified Rankin (Stroke Patients Only) Modified Rankin (Stroke Patients Only) Pre-Morbid Rankin Score: No symptoms Modified Rankin: Severe disability        Communication Communication Communication: Impaired Factors Affecting Communication: Difficulty expressing self;Reduced clarity of speech  Cognition Arousal: Obtunded Behavior During Therapy: Flat affect   PT - Cognitive impairments: Difficult to assess Difficult to assess due to: Level of arousal    PT - Cognition Comments: Kept eyes closed through majority of session. Intermittently following commands Following commands: Impaired Following commands impaired: Follows one step commands inconsistently, Follows one step commands with increased time    Cueing Cueing Techniques: Verbal cues, Tactile cues  Exercises General Exercises - Upper Extremity Shoulder Flexion: AAROM, PROM, Both, 5 reps, Supine (L PROM, R AAROM) Elbow Flexion: AAROM, PROM, Both, 5 reps, Supine (L PROM, R AAROM) General Exercises - Lower Extremity Ankle Circles/Pumps: AAROM, PROM, Both, 10 reps, Supine (L PROM) Long Arc Quad: AAROM, PROM, Both, 10 reps, Supine (L PROM) Heel Slides: AAROM, PROM, Both, 5 reps, Supine (L PROM) Hip Flexion/Marching: PROM, AAROM, Both, 5 reps, Supine (L PROM)        Pertinent Vitals/Pain Pain Assessment Pain Assessment: No/denies pain     PT Goals (current goals can now be found in the care plan section) Acute Rehab PT Goals Patient Stated Goal: return to living at her house PT Goal Formulation:  With patient Time For Goal Achievement: 02/20/24 Potential to  Achieve Goals: Good Progress towards PT goals: Not progressing toward goals - comment (limited by lethargy)    Frequency    Min 2X/week       AM-PAC PT 6 Clicks Mobility   Outcome Measure  Help needed turning from your back to your side while in a flat bed without using bedrails?: A Lot Help needed moving from lying on your back to sitting on the side of a flat bed without using bedrails?: A Lot Help needed moving to and from a bed to a chair (including a wheelchair)?: Total Help needed standing up from a chair using your arms (e.g., wheelchair or bedside chair)?: Total Help needed to walk in hospital room?: Total Help needed climbing 3-5 steps with a railing? : Total 6 Click Score: 8    End of Session Equipment Utilized During Treatment: Oxygen Activity Tolerance: Patient limited by lethargy;Patient limited by fatigue Patient left: in bed;with call bell/phone within reach;with bed alarm set Nurse Communication: Mobility status PT Visit Diagnosis: Unsteadiness on feet (R26.81);Muscle weakness (generalized) (M62.81);Hemiplegia and hemiparesis Hemiplegia - Right/Left: Left Hemiplegia - dominant/non-dominant: Non-dominant Hemiplegia - caused by: Cerebral infarction     Time: 0814-0830 PT Time Calculation (min) (ACUTE ONLY): 16 min  Charges:    $Therapeutic Exercise: 8-22 mins PT General Charges $$ ACUTE PT VISIT: 1 Visit                     Kate ORN, PT, DPT Secure Chat Preferred  Rehab Office [phone]   Kate BRAVO Wendolyn 02/07/2024, 9:23 AM

## 2024-02-07 NOTE — Assessment & Plan Note (Addendum)
 Continue with antiplatelet therapy.

## 2024-02-07 NOTE — Assessment & Plan Note (Addendum)
 Continue blood pressure monitoring  Continue blood pressure control with losartan .

## 2024-02-07 NOTE — Assessment & Plan Note (Addendum)
 Continue glucose cover and monitoring with insulin  sliding scale.  Hyperglycemia, on basal insulin  Continue with tube feedings.  Patient will be NPO after midnight for PEG tube will plan to further adjust insulin  after procedure.   Continue with statin therapy

## 2024-02-08 ENCOUNTER — Inpatient Hospital Stay (HOSPITAL_COMMUNITY)

## 2024-02-08 DIAGNOSIS — I5033 Acute on chronic diastolic (congestive) heart failure: Secondary | ICD-10-CM | POA: Diagnosis not present

## 2024-02-08 DIAGNOSIS — E66811 Obesity, class 1: Secondary | ICD-10-CM | POA: Diagnosis present

## 2024-02-08 LAB — BASIC METABOLIC PANEL WITH GFR
Anion gap: 11 (ref 5–15)
BUN: 25 mg/dL — ABNORMAL HIGH (ref 8–23)
CO2: 21 mmol/L — ABNORMAL LOW (ref 22–32)
Calcium: 8.4 mg/dL — ABNORMAL LOW (ref 8.9–10.3)
Chloride: 110 mmol/L (ref 98–111)
Creatinine, Ser: 0.94 mg/dL (ref 0.44–1.00)
GFR, Estimated: >60 mL/min (ref >60)
Glucose, Bld: 111 mg/dL — ABNORMAL HIGH (ref 70–99)
Potassium: 4.1 mmol/L (ref 3.5–5.1)
Sodium: 142 mmol/L (ref 135–145)

## 2024-02-08 LAB — CULTURE, BLOOD (ROUTINE X 2)
Culture: NO GROWTH
Culture: NO GROWTH
Special Requests: ADEQUATE
Special Requests: ADEQUATE

## 2024-02-08 LAB — CBC WITH DIFFERENTIAL/PLATELET
Abs Immature Granulocytes: 0.08 10*3/uL — ABNORMAL HIGH (ref 0.00–0.07)
Basophils Absolute: 0.1 10*3/uL (ref 0.0–0.1)
Basophils Relative: 0 %
Eosinophils Absolute: 0.4 10*3/uL (ref 0.0–0.5)
Eosinophils Relative: 3 %
HCT: 40 % (ref 36.0–46.0)
Hemoglobin: 11.5 g/dL — ABNORMAL LOW (ref 12.0–15.0)
Immature Granulocytes: 1 %
Lymphocytes Relative: 15 %
Lymphs Abs: 1.9 10*3/uL (ref 0.7–4.0)
MCH: 25.4 pg — ABNORMAL LOW (ref 26.0–34.0)
MCHC: 28.8 g/dL — ABNORMAL LOW (ref 30.0–36.0)
MCV: 88.5 fL (ref 80.0–100.0)
Monocytes Absolute: 0.9 10*3/uL (ref 0.1–1.0)
Monocytes Relative: 7 %
Neutro Abs: 9.3 10*3/uL — ABNORMAL HIGH (ref 1.7–7.7)
Neutrophils Relative %: 74 %
Platelets: 199 10*3/uL (ref 150–400)
RBC: 4.52 MIL/uL (ref 3.87–5.11)
RDW: 17.2 % — ABNORMAL HIGH (ref 11.5–15.5)
WBC: 12.6 10*3/uL — ABNORMAL HIGH (ref 4.0–10.5)
nRBC: 0 % (ref 0.0–0.2)

## 2024-02-08 LAB — MAGNESIUM: Magnesium: 2.3 mg/dL (ref 1.7–2.4)

## 2024-02-08 LAB — GLUCOSE, CAPILLARY
Glucose-Capillary: 111 mg/dL — ABNORMAL HIGH (ref 70–99)
Glucose-Capillary: 120 mg/dL — ABNORMAL HIGH (ref 70–99)
Glucose-Capillary: 109 mg/dL — ABNORMAL HIGH (ref 70–99)
Glucose-Capillary: 100 mg/dL — ABNORMAL HIGH (ref 70–99)
Glucose-Capillary: 123 mg/dL — ABNORMAL HIGH (ref 70–99)

## 2024-02-08 MED ORDER — FUROSEMIDE 10 MG/ML IJ SOLN
40.0000 mg | Freq: Once | INTRAMUSCULAR | Status: AC
  Administered 2024-02-08: 40 mg via INTRAVENOUS
  Filled 2024-02-08: qty 4

## 2024-02-08 MED ORDER — POLYETHYLENE GLYCOL 3350 17 G PO PACK: 17.0000 g | PACK | Freq: Every day | ORAL | Status: DC | PRN

## 2024-02-08 NOTE — Progress Notes (Signed)
  Progress Note   Patient: Christina Bennett FMW:[id number] DOB: 1940/10/28 DOA: 02/02/2024     5 DOS: the patient was seen and examined on 02/08/2024 at 9:47AM      Brief hospital course: 83 y.o. F with COPD not on home O2, obesity, CAD, CKD IIIa baseline 0.8-1.1 who presented with dizziness, blurry vision, HA, and decrased appetite.  Found to have CHF, stroke.     Assessment and Plan: * Acute on chronic diastolic CHF (congestive heart failure) (HCC) CXR today still shows bilateral opacities - Continue IV Lasix - Daily BMP   CVA (cerebral vascular accident) (HCC) MRI brain showed small pontine infarct. - Non-invasive angiography showed focal severe right P2 stenosis and moderate vertebral right stenosis - Echocardiogram showed no cardiogenic source of embolism - Carotid imaging unremarkable   - Lipids ordered: on Lipitor - Aspirin allergic - Continue Plavix - Evaluation for arrhythmia/atrial fibrillation: none on monitoring - tPA given - Dysphagia screen ordered, on dysphagia 2 diet, minced foods honey thick liquids - PT eval ordered: SNF - Nonsmoker     Dysphagia Has a history of esophageal stricture.  Here her problems are mostly oropharyngeal, due to stroke. - Dysphagia 2 diet - Honey thick liquids  Essential hypertension Blood pressure elevated - Continue losartan   Chronic kidney disease (CKD), stage IIIa (moderate) (HCC) Cr stable relative to baseline  History of CAD (coronary artery disease) Aspirin allergic - Continue Lipitor, Plavix  Acute metabolic encephalopathy Fluctuating alertness.  Seems to be delirium from stroke, CHF  COPD (chronic obstructive pulmonary disease) (HCC) No active flare - Continue Breztri   Non-insulin  dependent type 2 diabetes mellitus (HCC) Uncontrolled.  A1c 9.2% Glucose controlled - Hold glimepiride - Continue SS corrections  Iron deficiency anemia Hgb stable  Morbid obesity        Subjective: Patient complains  of some neck pain, orthopnea.  Denies swelling, fever, cough.  Still with dysphagia.     Physical Exam: BP (!) 152/62 (BP Location: Right Arm)   Pulse 72   Temp 97.6 F (36.4 C) (Oral)   Resp 14   Ht 5' 2 (1.575 m)   Wt 89.6 kg   SpO2 96%   BMI 36.13 kg/m   Obese adult female, lying in bed, opens eyes, responds to questions.  Oriented to self, [facility], month. RRR, no murmurs, no pitting edema in the extremities, JVP not visible Respiratory rate normal, lung sounds diminished, wheezing bilaterally, no rales Abdomen soft, no tenderness palpation or guarding Weakness on the left side 2/5, weakness on the right side 3-4/5. Attention normal, affect blunted, psychomotor slowing noted    Data Reviewed: Basic metabolic panel shows elevated BUN to creatinine ratio, normal potassium and creatinine CXR personally reviewed, shows intertitial markings, CBC shows mild anemia, overall stable    Family Communication: Niece/POA by phone    Disposition: Status is: Inpatient         Author: Lonni SHAUNNA Dalton, MD 02/08/2024 1:47 PM  For on call review [email or web address].

## 2024-02-08 NOTE — Assessment & Plan Note (Addendum)
 Calculated BMI 34.6

## 2024-02-08 NOTE — NC FL2 (Signed)
 [place]  MEDICAID FL2 LEVEL OF CARE FORM     IDENTIFICATION  Patient Name: Christina Bennett Birthdate: 04/10/1941 Sex: female Admission Date (Current Location): 02/02/2024  [facility] and [place] Number:  Producer, television/film/video and Address:  [facility] [place], [address] [place], [name] [address]      Provider Number: [id number]  Attending Physician Name and Address:  Jonel Lonni SQUIBB, *  Relative Name and Phone Number:  [name] (Niece)  [phone]    Current Level of Care: Hospital Recommended Level of Care: Skilled Nursing Facility Prior Approval Number:    Date Approved/Denied:   PASRR Number: [id number]  Discharge Plan: SNF    Current Diagnoses: Patient Active Problem List   Diagnosis Date Noted   CVA (cerebral vascular accident) (HCC) 02/07/2024   Acute respiratory failure with hypoxia (HCC) 02/07/2024   Acute on chronic diastolic CHF (congestive heart failure) (HCC) 02/07/2024   Dysphagia 02/07/2024   Acute metabolic encephalopathy 02/03/2024   History of CAD (coronary artery disease) 02/03/2024   Chronic kidney disease (CKD), stage IIIa (moderate) (HCC) 02/03/2024   Essential hypertension 02/03/2024   COPD (chronic obstructive pulmonary disease) (HCC) 09/19/2022   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   Coronary artery disease involving native heart 10/04/2014   Chest pain 10/04/2014   Non-insulin  dependent type 2 diabetes mellitus (HCC) 10/04/2014   Hyperlipidemia 10/04/2014   Iron deficiency anemia 10/21/2012    Orientation RESPIRATION BLADDER Height & Weight     Self, Place, Time, Situation  Normal Continent, External catheter Weight: 197 lb 8.5 oz (89.6 kg) Height:  5' 2 (157.5 cm)  BEHAVIORAL SYMPTOMS/MOOD NEUROLOGICAL BOWEL NUTRITION STATUS      Continent Diet (see DC summary)  AMBULATORY STATUS COMMUNICATION OF NEEDS Skin   Extensive Assist Verbally Normal                        Personal Care Assistance Level of Assistance  Bathing, Feeding, Dressing Bathing Assistance: Maximum assistance Feeding assistance: Limited assistance Dressing Assistance: Maximum assistance     Functional Limitations Info  Sight, Hearing, Speech Sight Info: Adequate Hearing Info: Adequate Speech Info: Impaired (slurred speech)    SPECIAL CARE FACTORS FREQUENCY  PT (By licensed PT), OT (By licensed OT)     PT Frequency: 5x/week OT Frequency: 5x/week            Contractures Contractures Info: Not present    Additional Factors Info  Code Status, Allergies Code Status Info: FULL Allergies Info: Aspirin  Bee Venom  Ibuprofen  Percocet (Oxycodone-acetaminophen )           Current Medications (02/08/2024):  This is the current hospital active medication list Current Facility-Administered Medications  Medication Dose Route Frequency Provider Last Rate Last Admin   0.9 %  sodium chloride  infusion   Intravenous Continuous Jerri Pfeiffer, MD 40 mL/hr at 02/08/24 0317 Infusion Verify at 02/08/24 0317   atorvastatin  (LIPITOR) tablet 40 mg  40 mg Oral Daily Jerri Pfeiffer, MD   40 mg at 02/07/24 1148   budesonide -glycopyrrolate -formoterol  (BREZTRI ) 160-9-4.8 MCG/ACT inhaler 2 puff  2 puff Inhalation BID Sundil, Subrina, MD   2 puff at 02/08/24 0856   calcium  carbonate (TUMS - dosed in mg elemental calcium ) chewable tablet 200 mg of elemental calcium   1 tablet Oral Q2H PRN Olalere, Adewale A, MD       Chlorhexidine  Gluconate Cloth 2 % PADS 6  each  6 each Topical Daily Bhagat, Srishti L, MD   6 each at 02/06/24 0949   clopidogrel (PLAVIX) tablet 75 mg  75 mg Oral Daily Remi Pippin, NP   75 mg at 02/07/24 1148   clotrimazole (GYNE-LOTRIMIN) vaginal cream 1 Applicatorful  1 Applicatorful Vaginal QHS Jonel Lonni SQUIBB, MD   1 Applicatorful at 02/07/24 2209   dextrose  50 % solution 50 mL  50 mL Intravenous PRN Sundil, Subrina, MD       diphenhydrAMINE  (BENADRYL )  injection 50 mg  50 mg Intravenous Q6H PRN Sundil, Subrina, MD       enoxaparin (LOVENOX) injection 40 mg  40 mg Subcutaneous Q24H Remi Pippin, NP   40 mg at 02/07/24 1511   haloperidol  lactate (HALDOL ) injection 2 mg  2 mg Intravenous Q6H PRN Sundil, Subrina, MD   2 mg at 02/07/24 0537   hydrALAZINE  (APRESOLINE ) injection 10 mg  10 mg Intravenous Q6H PRN Bhagat, Srishti L, MD       HYDROcodone -acetaminophen  (NORCO) 7.5-325 MG per tablet 1-2 tablet  1-2 tablet Oral Q6H PRN Neda Hammond A, MD   2 tablet at 02/08/24 0425   insulin  aspart (novoLOG ) injection 0-5 Units  0-5 Units Subcutaneous QHS Merilee Linsey I, [facility]       insulin  aspart (novoLOG ) injection 0-9 Units  0-9 Units Subcutaneous TID WC Merilee Linsey I, RPH   1 Units at 02/07/24 1709   ipratropium-albuterol  (DUONEB) 0.5-2.5 (3) MG/3ML nebulizer solution 3 mL  3 mL Nebulization Q6H PRN Sundil, Subrina, MD       labetalol  (NORMODYNE ) injection 10 mg  10 mg Intravenous Q2H PRN Bhagat, Srishti L, MD       losartan  (COZAAR ) tablet 100 mg  100 mg Oral Daily Remi Pippin, NP   100 mg at 02/07/24 1148   montelukast  (SINGULAIR ) tablet 10 mg  10 mg Oral QPM Sundil, Subrina, MD   10 mg at 02/07/24 1811   ondansetron  (ZOFRAN ) tablet 4 mg  4 mg Oral Q6H PRN Sundil, Subrina, MD       Or   ondansetron  (ZOFRAN ) injection 4 mg  4 mg Intravenous Q6H PRN Sundil, Subrina, MD       Oral care mouth rinse  15 mL Mouth Rinse PRN Patsy Lenis, MD       pantoprazole  (PROTONIX ) EC tablet 40 mg  40 mg Oral Daily Mitchell, Madeline I, RPH   40 mg at 02/07/24 1148   polyethylene glycol (MIRALAX / GLYCOLAX) packet 17 g  17 g Oral Daily PRN Shona Laurence N, DO       sodium chloride  flush (NS) 0.9 % injection 3 mL  3 mL Intravenous Q12H Sundil, Subrina, MD   3 mL at 02/07/24 1000   sodium chloride  flush (NS) 0.9 % injection 3 mL  3 mL Intravenous PRN Sundil, Subrina, MD       sodium chloride  flush (NS) 0.9 % injection 3-10 mL  3-10 mL Intravenous Q12H  Pahwani, Ravi, MD   10 mL at 02/07/24 1149   sodium chloride  flush (NS) 0.9 % injection 3-10 mL  3-10 mL Intravenous PRN Vernon Ranks, MD         Discharge Medications: Please see discharge summary for a list of discharge medications.  Relevant Imaging Results:  Relevant Lab Results:   Additional Information [id number]  Naliya Gish A [place], LCSW

## 2024-02-08 NOTE — TOC Progression Note (Signed)
 Transition of Care [facility]) - Progression Note    Patient Details  Name: Christina Bennett MRN: [id number] Date of Birth: [birth year]  [facility] CM/SW Contact  Roshunda Keir A [place], LCSW Phone Number: 02/08/2024, 9:48 AM  Clinical Narrative:      CSW made attempt to contact pt, unable to be reached. CSW followed up with pt's niece, [name].   She stated that pt may not be able to understand current situation, was pt's HCPOA, and requested CSW continue to follow up with her on the plan for discharge. CSW notified her of recommendation for rehab. She said that was expected and she and pt are agreeable per previous discussions. She said pt also lives alone and would not be able to manage at home once Dc'd from the hospital. No preference for facility, however [facility] is closest to pt's home, open to local [place] facilities as well.   SNF workup completed, bed offers pending. Stated she would provided HCPOA paperwork when she visits this afternoon or Tuesday.    TOC will continue to follow.    Barriers to Discharge: Continued Medical Work up  Expected Discharge Plan and Services In-house Referral: Clinical Social Work     Living arrangements for the past 2 months: Single Family Home                                       Social Determinants of Health (SDOH) Interventions SDOH Screenings   Food Insecurity: Patient Unable To Answer (02/03/2024)  Housing: Unknown (02/03/2024)  Transportation Needs: Patient Unable To Answer (02/03/2024)  Utilities: Patient Unable To Answer (02/03/2024)  Social Connections: Patient Unable To Answer (02/03/2024)  Tobacco Use: Medium Risk (02/03/2024)    Readmission Risk Interventions     No data to display

## 2024-02-08 NOTE — Plan of Care (Signed)
  Problem: Safety: Goal: Non-violent Restraint(s) Outcome: Not Applicable   Problem: Education: Goal: Ability to describe self-care measures that may prevent or decrease complications (Diabetes Survival Skills Education) will improve Outcome: Not Progressing Goal: Individualized Educational Video(s) Outcome: Not Progressing   Problem: Coping: Goal: Ability to adjust to condition or change in health will improve Outcome: Not Progressing

## 2024-02-08 NOTE — Plan of Care (Signed)
  Problem: Education: Goal: Individualized Educational Video(s) Outcome: Progressing   Problem: Metabolic: Goal: Ability to maintain appropriate glucose levels will improve Outcome: Progressing   Problem: Tissue Perfusion: Goal: Adequacy of tissue perfusion will improve Outcome: Progressing   Problem: Education: Goal: Knowledge of General Education information will improve Description: Including pain rating scale, medication(s)/side effects and non-pharmacologic comfort measures Outcome: Progressing   Problem: Clinical Measurements: Goal: Ability to maintain clinical measurements within normal limits will improve Outcome: Progressing Goal: Will remain free from infection Outcome: Progressing Goal: Diagnostic test results will improve Outcome: Progressing Goal: Cardiovascular complication will be avoided Outcome: Progressing   Problem: Elimination: Goal: Will not experience complications related to urinary retention Outcome: Progressing   Problem: Safety: Goal: Ability to remain free from injury will improve Outcome: Progressing   Problem: Clinical Measurements: Goal: Ability to maintain a body temperature in the normal range will improve Outcome: Progressing   Problem: Respiratory: Goal: Ability to maintain adequate ventilation will improve Outcome: Progressing Goal: Ability to maintain a clear airway will improve Outcome: Progressing   Problem: Education: Goal: Knowledge of disease or condition will improve Outcome: Progressing Goal: Knowledge of secondary prevention will improve (MUST DOCUMENT ALL) Outcome: Progressing Goal: Knowledge of patient specific risk factors will improve (DELETE if not current risk factor) Outcome: Progressing   Problem: Ischemic Stroke/TIA Tissue Perfusion: Goal: Complications of ischemic stroke/TIA will be minimized Outcome: Progressing   Problem: Coping: Goal: Will verbalize positive feelings about self Outcome:  Progressing Goal: Will identify appropriate support needs Outcome: Progressing   Problem: Health Behavior/Discharge Planning: Goal: Ability to manage health-related needs will improve Outcome: Progressing Goal: Goals will be collaboratively established with patient/family Outcome: Progressing   Problem: Self-Care: Goal: Ability to participate in self-care as condition permits will improve Outcome: Progressing Goal: Verbalization of feelings and concerns over difficulty with self-care will improve Outcome: Progressing Goal: Ability to communicate needs accurately will improve Outcome: Progressing

## 2024-02-08 NOTE — Progress Notes (Signed)
 Patient stated she has not had a BM in over a week.  I requested an order from Dr Shona to help her.

## 2024-02-09 DIAGNOSIS — I5033 Acute on chronic diastolic (congestive) heart failure: Secondary | ICD-10-CM | POA: Diagnosis not present

## 2024-02-09 LAB — COMPREHENSIVE METABOLIC PANEL WITH GFR
ALT: 13 U/L (ref 0–44)
AST: 25 U/L (ref 15–41)
Albumin: 3 g/dL — ABNORMAL LOW (ref 3.5–5.0)
Alkaline Phosphatase: 75 U/L (ref 38–126)
Anion gap: 11 (ref 5–15)
BUN: 22 mg/dL (ref 8–23)
CO2: 27 mmol/L (ref 22–32)
Calcium: 9 mg/dL (ref 8.9–10.3)
Chloride: 105 mmol/L (ref 98–111)
Creatinine, Ser: 0.88 mg/dL (ref 0.44–1.00)
GFR, Estimated: >60 mL/min (ref >60)
Glucose, Bld: 113 mg/dL — ABNORMAL HIGH (ref 70–99)
Potassium: 3.9 mmol/L (ref 3.5–5.1)
Sodium: 143 mmol/L (ref 135–145)
Total Bilirubin: 0.9 mg/dL (ref 0.0–1.2)
Total Protein: 6.4 g/dL — ABNORMAL LOW (ref 6.5–8.1)

## 2024-02-09 LAB — CBC
HCT: 42.2 % (ref 36.0–46.0)
Hemoglobin: 12.2 g/dL (ref 12.0–15.0)
MCH: 25.2 pg — ABNORMAL LOW (ref 26.0–34.0)
MCHC: 28.9 g/dL — ABNORMAL LOW (ref 30.0–36.0)
MCV: 87.2 fL (ref 80.0–100.0)
Platelets: 199 10*3/uL (ref 150–400)
RBC: 4.84 MIL/uL (ref 3.87–5.11)
RDW: 17.1 % — ABNORMAL HIGH (ref 11.5–15.5)
WBC: 12.8 10*3/uL — ABNORMAL HIGH (ref 4.0–10.5)
nRBC: 0 % (ref 0.0–0.2)

## 2024-02-09 LAB — GLUCOSE, CAPILLARY
Glucose-Capillary: 100 mg/dL — ABNORMAL HIGH (ref 70–99)
Glucose-Capillary: 120 mg/dL — ABNORMAL HIGH (ref 70–99)
Glucose-Capillary: 120 mg/dL — ABNORMAL HIGH (ref 70–99)
Glucose-Capillary: 141 mg/dL — ABNORMAL HIGH (ref 70–99)

## 2024-02-09 MED ORDER — POLYVINYL ALCOHOL 1.4 % OP SOLN
1.0000 [drp] | Freq: Every day | OPHTHALMIC | Status: DC
  Administered 2024-02-09 – 2024-02-25 (×16): 1 [drp] via OPHTHALMIC
  Filled 2024-02-09: qty 15

## 2024-02-09 MED ORDER — VALACYCLOVIR HCL 500 MG PO TABS
1000.0000 mg | ORAL_TABLET | Freq: Every day | ORAL | Status: DC
  Administered 2024-02-09 – 2024-02-25 (×17): 1000 mg via ORAL
  Filled 2024-02-09 (×17): qty 2

## 2024-02-09 MED ORDER — SENNOSIDES-DOCUSATE SODIUM 8.6-50 MG PO TABS
1.0000 | ORAL_TABLET | Freq: Every day | ORAL | Status: DC
  Administered 2024-02-09 – 2024-02-24 (×15): 1 via ORAL
  Filled 2024-02-09 (×16): qty 1

## 2024-02-09 MED ORDER — MELATONIN 5 MG PO TABS
5.0000 mg | ORAL_TABLET | Freq: Every evening | ORAL | Status: DC | PRN
  Administered 2024-02-11 – 2024-02-24 (×6): 5 mg via ORAL
  Filled 2024-02-09 (×8): qty 1

## 2024-02-09 MED ORDER — LIVING WELL WITH DIABETES BOOK
Freq: Once | Status: AC
  Filled 2024-02-09: qty 1

## 2024-02-09 MED ORDER — ACETAMINOPHEN 325 MG PO TABS
650.0000 mg | ORAL_TABLET | Freq: Four times a day (QID) | ORAL | Status: DC | PRN
  Administered 2024-02-09 – 2024-02-15 (×6): 650 mg via ORAL
  Filled 2024-02-09 (×6): qty 2

## 2024-02-09 MED ORDER — POLYETHYLENE GLYCOL 3350 17 G PO PACK
17.0000 g | PACK | Freq: Every day | ORAL | Status: DC
  Administered 2024-02-09 – 2024-02-24 (×12): 17 g via ORAL
  Filled 2024-02-09 (×17): qty 1

## 2024-02-09 MED ORDER — PREDNISOLONE ACETATE 1 % OP SUSP
1.0000 [drp] | Freq: Every day | OPHTHALMIC | Status: DC
  Filled 2024-02-09: qty 5

## 2024-02-09 MED ORDER — PREDNISOLONE ACETATE 1 % OP SUSP
1.0000 [drp] | Freq: Every day | OPHTHALMIC | Status: DC
  Administered 2024-02-10 – 2024-02-23 (×14): 1 [drp] via OPHTHALMIC
  Filled 2024-02-09 (×2): qty 5

## 2024-02-09 MED ORDER — POLYETHYLENE GLYCOL 3350 17 G PO PACK
17.0000 g | PACK | Freq: Every day | ORAL | Status: DC | PRN
  Filled 2024-02-09: qty 1

## 2024-02-09 MED ORDER — ENSURE PLUS HIGH PROTEIN PO LIQD
237.0000 mL | Freq: Two times a day (BID) | ORAL | Status: DC
  Administered 2024-02-09 – 2024-02-10 (×3): 237 mL via ORAL

## 2024-02-09 NOTE — Progress Notes (Signed)
 Speech Language Pathology Treatment: Dysphagia  Patient Details Name: Christina Bennett MRN: [id number] DOB: 29-Mar-1941 Today's Date: 02/09/2024 Time: 8684-8674 SLP Time Calculation (min) (ACUTE ONLY): 10 min  Assessment / Plan / Recommendation Clinical Impression  Pt accepted minimal POs today, stating that she does not like them. She was more agreeable to liquids but only took one bite of puree [employer]). Pt self-advocated for use of spoon to consume honey thick liquids, needing encouragement to try a cup sip and afterwards stating that it was better from the spoon (cannot elaborate why). Overall, pt had oral holding that required Min cues but had no overt s/s of aspiration. Recommend continuing with current diet for now. Will f/u for potential to initiate more swallowing therapy.   HPI HPI: Patient is an 83 y.o. female with PMH: CAD, COPD, essential HTN, CKD stage III, morbid obesity. She presented to the Ed on 02/03/24 with multiple complaints inclucding generalized weakeness, HA, poor appetite, chest congestion, blurry vision and family reporting change in her speech. ED evaluation found her to be hemodynamically stable but with BP up to 183/86, UA showed evidence of UTI. CT head negative for acute intracranial abnormality or hemorrhage but did show moderate periventricular WM changes likely reflecting the sequela of small vessel ischemia. CXR showed diffuse interstitial opacities, R>L. While in ED, patient became very restless, anxious and fidgety. Despite multiple doses of Ativan  to attempt MRI,, it was canceled due to patient not cooperating. Non-violent restraints placed. SLP swallow evalauation ordered secondary to family reports of swallowing difficulities as well as RN observing patient to not be able to swallow even smaller pills, and coughing with liquids.  MBSS 6/25 with recs for D3/NTL. Pt with code stroke overnight and reassessed 6/26 with change in swallow function.      SLP Plan   Continue with current plan of care          Recommendations  Diet recommendations: Dysphagia 2 (fine chop);Honey-thick liquid Liquids provided via: Cup;Teaspoon;No straw Medication Administration: Crushed with puree Supervision: Staff to assist with self feeding;Full supervision/cueing for compensatory strategies Compensations: Slow rate;Small sips/bites;Minimize environmental distractions Postural Changes and/or Swallow Maneuvers: Seated upright 90 degrees;Upright 30-60 min after meal                  Oral care BID   Frequent or constant Supervision/Assistance Dysphagia, oropharyngeal phase (R13.12)     Continue with current plan of care     Leita SAILOR., M.A. CCC-SLP Acute Rehabilitation Services Office: [phone]  Secure chat preferred   02/09/2024, 1:29 PM

## 2024-02-09 NOTE — Progress Notes (Signed)
 Physical Therapy Treatment Patient Details Name: Christina Bennett MRN: [id number] DOB: [birth year] Today's Date: 02/09/2024   History of Present Illness The pt is an 83 yo female presenting 6/23 with AMS. Initial work up with concern for UTI, pt given antibiotics and returned to baseline. Code stroke called 1AM 6/26, pt given TNK and transferred to ICU. 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 PMH includes: COPD, DM II, DVT, HTN, arthritis.    PT Comments  Pt reporting headache and was medicated prior to session. Pt agreeable to participate. Session focused on therapeutic activity, utilizing Stedy to work on sit to stands to promote lower body strength for pre transfer training. In sitting, pt also performed reaching tasks with RUE while maintaining LUE support on Stedy bar to promote weightbearing through LUE for increased neuro-recovery. Patient will benefit from continued inpatient follow up therapy, <3 hours/day.   If plan is discharge home, recommend the following: Two people to help with walking and/or transfers;Two people to help with bathing/dressing/bathroom;Assistance with cooking/housework;Direct supervision/assist for medications management;Direct supervision/assist for financial management;Assist for transportation;Help with stairs or ramp for entrance   Can travel by private vehicle     No  Equipment Recommendations  Wheelchair (measurements PT);Wheelchair cushion (measurements PT);Hoyer lift    Recommendations for Other Services       Precautions / Restrictions Precautions Precautions: Fall Recall of Precautions/Restrictions: Impaired Restrictions Weight Bearing Restrictions Per Provider Order: No     Mobility  Bed Mobility Overal bed mobility: Needs Assistance Bed Mobility: Supine to Sit, Sit to Supine     Supine to sit: Mod assist, +2 for physical assistance Sit to  supine: Mod assist, +2 for physical assistance   General bed mobility comments: Exiting towards R side of bed, pt able to initiate bringing BLE's towards edge, use of bed pad to pivot hips and help at trunk for support.    Transfers Overall transfer level: Needs assistance Equipment used: Ambulation equipment used Transfers: Sit to/from Stand Sit to Stand: Mod assist, +2 physical assistance           General transfer comment: ModA + 2 to stand to [place], with LUE stabilization. Verbal/tactile cues for glute activation, upward gaze    Ambulation/Gait                   Stairs             Wheelchair Mobility     Tilt Bed    Modified Rankin (Stroke Patients Only) Modified Rankin (Stroke Patients Only) Pre-Morbid Rankin Score: No symptoms Modified Rankin: Severe disability     Balance Overall balance assessment: Needs assistance Sitting-balance support: Feet supported Sitting balance-Leahy Scale: Poor Sitting balance - Comments: Pt with one episode of right lateral LOB, requiring modA to correct                                    Communication Communication Communication: Impaired Factors Affecting Communication: Difficulty expressing self;Reduced clarity of speech  Cognition Arousal: Alert Behavior During Therapy: Flat affect   PT - Cognitive impairments: No apparent impairments                         Following commands: Intact      Cueing Cueing Techniques: Verbal cues, Tactile cues  Exercises Other Exercises Other Exercises: Sitting in [place]: functional reaching with RUE x 20 reps while stabilizing LUE to promote weightbearing Other Exercises: Sit to stands x 4 in [place], weight shifts to R/L    General Comments        Pertinent Vitals/Pain Pain Assessment Pain Assessment: Faces Faces Pain Scale: Hurts even more Pain Location: Headache Pain Descriptors / Indicators: Headache Pain Intervention(s): Limited  activity within patient's tolerance, Monitored during session, Premedicated before session    Home Living                          Prior Function            PT Goals (current goals can now be found in the care plan section) Acute Rehab PT Goals Patient Stated Goal: return to living at her house Potential to Achieve Goals: Fair Progress towards PT goals: Progressing toward goals    Frequency    Min 2X/week      PT Plan      Co-evaluation              AM-PAC PT 6 Clicks Mobility   Outcome Measure  Help needed turning from your back to your side while in a flat bed without using bedrails?: A Lot Help needed moving from lying on your back to sitting on the side of a flat bed without using bedrails?: A Lot Help needed moving to and from a bed to a chair (including a wheelchair)?: A Lot Help needed standing up from a chair using your arms (e.g., wheelchair or bedside chair)?: Total Help needed to walk in hospital room?: Total Help needed climbing 3-5 steps with a railing? : Total 6 Click Score: 9    End of Session Equipment Utilized During Treatment: Gait belt Activity Tolerance: Patient tolerated treatment well Patient left: in bed;with call bell/phone within reach;with bed alarm set   PT Visit Diagnosis: Unsteadiness on feet (R26.81);Muscle weakness (generalized) (M62.81);Hemiplegia and hemiparesis Hemiplegia - Right/Left: Left Hemiplegia - dominant/non-dominant: Non-dominant Hemiplegia - caused by: Cerebral infarction     Time: 8665-8642 PT Time Calculation (min) (ACUTE ONLY): 23 min  Charges:    $Therapeutic Activity: 23-37 mins PT General Charges $$ ACUTE PT VISIT: 1 Visit                     Aleck Bennett, PT, DPT Acute Rehabilitation Services Office [phone]    Christina Bennett 02/09/2024, 4:10 PM

## 2024-02-09 NOTE — Plan of Care (Signed)
 Has a poor appetite.  I have asked her what types of foods she likes so we can order them and she just says she just does not want to eat anything. Said her taste buds are off.  Does not like to reposition or turn.  Once repositioned, will ask to be turned back to the position that she was in just 10-20 minutes previously.    Problem: Education: Goal: Ability to describe self-care measures that may prevent or decrease complications (Diabetes Survival Skills Education) will improve Outcome: Progressing   Problem: Coping: Goal: Ability to adjust to condition or change in health will improve Outcome: Progressing   Problem: Nutritional: Goal: Maintenance of adequate nutrition will improve Outcome: Progressing   Problem: Skin Integrity: Goal: Risk for impaired skin integrity will decrease Outcome: Progressing   Problem: Activity: Goal: Risk for activity intolerance will decrease Outcome: Progressing   Problem: Coping: Goal: Level of anxiety will decrease Outcome: Progressing

## 2024-02-09 NOTE — TOC Progression Note (Addendum)
 Transition of Care [facility]) - Progression Note    Patient Details  Name: Christina Bennett MRN: [id number] Date of Birth: 07-Feb-1941  Transition of Care [facility] CM/SW Contact  [name], [name] Phone Number: 02/09/2024, 10:26 AM  Clinical Narrative:   Referral for [facility] remains pending. CSW asked [facility] to review, awaiting response.  UPDATE: CSW met with patient and niece, [name], at bedside to provide update on bed offers. [facility] is not able to offer a bed. Patient says she knows someone who is related to the family, asked her niece to call and see if they can't get a different answer. Niece to review other options in the meantime. CSW to follow.    Expected Discharge Plan: Skilled Nursing Facility Barriers to Discharge: Continued Medical Work up  Expected Discharge Plan and Services In-house Referral: Clinical Social Work     Living arrangements for the past 2 months: Single Family Home                                       Social Determinants of Health (SDOH) Interventions SDOH Screenings   Food Insecurity: Patient Unable To Answer (02/03/2024)  Housing: Unknown (02/03/2024)  Transportation Needs: Patient Unable To Answer (02/03/2024)  Utilities: Patient Unable To Answer (02/03/2024)  Social Connections: Patient Unable To Answer (02/03/2024)  Tobacco Use: Medium Risk (02/03/2024)    Readmission Risk Interventions     No data to display

## 2024-02-09 NOTE — Progress Notes (Signed)
  Progress Note   Patient: Christina Bennett DOB: 01/03/1941 DOA: 02/02/2024     6 DOS: the patient was seen and examined on 02/09/2024 at 9:20AM      Brief hospital course: 83 y.o. F with COPD not on home O2, obesity, CAD, CKD IIIa baseline 0.8-1.1 who presented with dizziness, blurry vision, HA, and decrased appetite.  Found to have CHF, stroke.     Assessment and Plan: * Acute on chronic diastolic CHF (congestive heart failure) (HCC) Treated with IV Lasix and resolved - Daily BMP   CVA (cerebral vascular accident) (HCC) -Continue Plavix, and Lipitor    Dysphagia Has a history of esophageal stricture.  Here her problems are mostly oropharyngeal, due to stroke. - Dysphagia 2 diet - Honey thick liquids  Essential hypertension Blood pressure controlled - Continue losartan   Chronic kidney disease (CKD), stage IIIa (moderate) (HCC) Cr stable relative to baseline  History of CAD (coronary artery disease) Aspirin allergic - Continue Lipitor, Plavix  Acute metabolic encephalopathy Fluctuating alertness.  Seems to be delirium from stroke, CHF  COPD (chronic obstructive pulmonary disease) (HCC) No active flare - Continue Breztri   Non-insulin  dependent type 2 diabetes mellitus (HCC) Uncontrolled.  A1c 9.2% Glucose controlled - Hold glimepiride - Continue SS corrections  Iron deficiency anemia Hgb stable  Morbid obesity         Subjective: Still complaining of headache.     Physical Exam: BP (!) 131/56 (BP Location: Right Arm)   Pulse 81   Temp 98.1 F (36.7 C) (Oral)   Resp 18   Ht 5' 2 (1.575 m)   Wt 89.6 kg   SpO2 93%   BMI 36.13 kg/m   Obese adult female, lying in bed, responsive to questions, interactive and oriented person place and time RRR, no murmurs, no peripheral edema, no JVP deviation Respiratory rate normal, lungs clear, no rales or wheezes Abdomen soft, no tenderness palpation or guarding Still weak on the left  side, somewhat weak in the right, still somewhat dysarthric      Data Reviewed: Basic metabolic panel shows normal electrolytes and renal function CBC shows stable mild leukocytosis    Family Communication: Niece at the bedside    Disposition: Status is: Inpatient         Author: Lonni SHAUNNA Dalton, MD 02/09/2024 5:24 PM  For on call review [email or web address].

## 2024-02-09 NOTE — Care Management Important Message (Signed)
 Important Message  Patient Details  Name: Christina Bennett MRN: [id number] Date of Birth: Feb 06, 1941   Important Message Given:  Yes - Medicare IM     Claretta Deed 02/09/2024, 3:12 PM

## 2024-02-09 NOTE — Progress Notes (Signed)
 Patient declined to take Breztri  inhaler because she wanted to be repositioned in the bed first. Called nurses station and requested help in turning patient. Explained to patient that RT could not move her on her own, she still declined to take inhaler while waiting for assistance.

## 2024-02-09 NOTE — Inpatient Diabetes Management (Signed)
 Inpatient Diabetes Program Recommendations  AACE/ADA: New Consensus Statement on Inpatient Glycemic Control (2015)  Target Ranges:  Prepandial:   less than 140 mg/dL      Peak postprandial:   less than 180 mg/dL (1-2 hours)      Critically ill patients:  140 - 180 mg/dL   Lab Results  Component Value Date   GLUCAP 120 (H) 02/09/2024   HGBA1C 9.2 (H) 02/03/2024    Review of Glycemic Control  Latest Reference Range & Units 02/08/24 06:10 02/08/24 11:32 02/08/24 16:32 02/08/24 21:27 02/09/24 06:25 02/09/24 11:49  Glucose-Capillary 70 - 99 mg/dL 879 (H) 890 (H) 899 (H) 123 (H) 100 (H) 120 (H)  (H): Data is abnormally high  Diabetes history: DM2 Outpatient Diabetes medications: Amaryl 3 mg every day, Farxiga 10 mg QD Current orders for Inpatient glycemic control: Novolog  0-9 units TID and 0-5 units  Met with patient and niece, [name].  Reviewed patient's current A1c of 9.2% (average BG of 217 mg/dL). Explained what a A1c is and what it measures. Also reviewed goal A1c with patient, importance of good glucose control @ home, and blood sugar goals.  Per [name], she drinks a lot of sugary beverages.  Encouraged her to avoid caloric beverages.  Educated on The Plate Method, CHO's, portion control, avoiding caloric beverages, CBGs at home fasting and mid afternoon, F/U with PCP every 3 months, bring meter to PCP office, long and short term complications of uncontrolled BG.  Currently she is not taking much in orally.  Plan for discharge to SNF.    Thank you, Wyvonna Pinal, MSN, CDCES Diabetes Coordinator Inpatient Diabetes Program [phone] (team pager from 8a-5p)

## 2024-02-09 NOTE — Care Management Important Message (Signed)
 Important Message  Patient Details  Name: Christina Bennett MRN: [id number] Date of Birth: [birth year]   Important Message Given:  Yes - Medicare IM     Claretta Deed 02/09/2024, 3:06 PM

## 2024-02-10 DIAGNOSIS — N179 Acute kidney failure, unspecified: Secondary | ICD-10-CM

## 2024-02-10 DIAGNOSIS — I5033 Acute on chronic diastolic (congestive) heart failure: Secondary | ICD-10-CM | POA: Diagnosis not present

## 2024-02-10 DIAGNOSIS — N1831 Chronic kidney disease, stage 3a: Secondary | ICD-10-CM | POA: Diagnosis present

## 2024-02-10 DIAGNOSIS — E440 Moderate protein-calorie malnutrition: Secondary | ICD-10-CM | POA: Diagnosis present

## 2024-02-10 LAB — CBC
HCT: 40.5 % (ref 36.0–46.0)
Hemoglobin: 11.8 g/dL — ABNORMAL LOW (ref 12.0–15.0)
MCH: 25.9 pg — ABNORMAL LOW (ref 26.0–34.0)
MCHC: 29.1 g/dL — ABNORMAL LOW (ref 30.0–36.0)
MCV: 89 fL (ref 80.0–100.0)
Platelets: 196 10*3/uL (ref 150–400)
RBC: 4.55 MIL/uL (ref 3.87–5.11)
RDW: 17.2 % — ABNORMAL HIGH (ref 11.5–15.5)
WBC: 12.5 10*3/uL — ABNORMAL HIGH (ref 4.0–10.5)
nRBC: 0 % (ref 0.0–0.2)

## 2024-02-10 LAB — COMPREHENSIVE METABOLIC PANEL WITH GFR
ALT: 11 U/L (ref 0–44)
AST: 27 U/L (ref 15–41)
Albumin: 2.9 g/dL — ABNORMAL LOW (ref 3.5–5.0)
Alkaline Phosphatase: 70 U/L (ref 38–126)
Anion gap: 7 (ref 5–15)
BUN: 36 mg/dL — ABNORMAL HIGH (ref 8–23)
CO2: 31 mmol/L (ref 22–32)
Calcium: 8.9 mg/dL (ref 8.9–10.3)
Chloride: 107 mmol/L (ref 98–111)
Creatinine, Ser: 1.88 mg/dL — ABNORMAL HIGH (ref 0.44–1.00)
GFR, Estimated: 27 mL/min — ABNORMAL LOW (ref >60)
Glucose, Bld: 124 mg/dL — ABNORMAL HIGH (ref 70–99)
Potassium: 4 mmol/L (ref 3.5–5.1)
Sodium: 145 mmol/L (ref 135–145)
Total Bilirubin: 0.6 mg/dL (ref 0.0–1.2)
Total Protein: 6 g/dL — ABNORMAL LOW (ref 6.5–8.1)

## 2024-02-10 LAB — GLUCOSE, CAPILLARY
Glucose-Capillary: 131 mg/dL — ABNORMAL HIGH (ref 70–99)
Glucose-Capillary: 121 mg/dL — ABNORMAL HIGH (ref 70–99)
Glucose-Capillary: 121 mg/dL — ABNORMAL HIGH (ref 70–99)
Glucose-Capillary: 126 mg/dL — ABNORMAL HIGH (ref 70–99)

## 2024-02-10 MED ORDER — ENSURE PLUS HIGH PROTEIN PO LIQD
237.0000 mL | Freq: Two times a day (BID) | ORAL | Status: DC
  Administered 2024-02-11 – 2024-02-23 (×11): 237 mL via ORAL

## 2024-02-10 MED ORDER — ENOXAPARIN SODIUM 30 MG/0.3ML IJ SOSY: 30.0000 mg | PREFILLED_SYRINGE | INTRAMUSCULAR | Status: DC

## 2024-02-10 MED ORDER — ENOXAPARIN SODIUM 30 MG/0.3ML IJ SOSY
30.0000 mg | PREFILLED_SYRINGE | INTRAMUSCULAR | Status: DC
  Administered 2024-02-10: 30 mg via SUBCUTANEOUS
  Filled 2024-02-10: qty 0.3

## 2024-02-10 MED ORDER — SODIUM CHLORIDE 0.9 % IV SOLN: INTRAVENOUS | Status: AC

## 2024-02-10 MED ORDER — BISACODYL 10 MG RE SUPP
10.0000 mg | Freq: Every day | RECTAL | Status: DC | PRN
  Administered 2024-02-10: 10 mg via RECTAL
  Filled 2024-02-10: qty 1

## 2024-02-10 NOTE — Assessment & Plan Note (Addendum)
 AKI.   Renal function has improved, continue close monitoring, avoid hypotension or nephrotoxic medications.  Check renal function and electrolytes in am.

## 2024-02-10 NOTE — Progress Notes (Signed)
 Nutrition Follow-up  DOCUMENTATION CODES:  Non-severe (moderate) malnutrition in context of acute illness/injury  INTERVENTION:  Continue kcal count to determine if pt is meeting nutrition needs Continue current diet as ordered Encourage PO intake Carnation Breakfast Essentials TID, each packet mixed with 8 ounces of 2% milk provides 13 grams of protein and 260 calories. Ensure Plus High Protein po BID, each supplement provides 350 kcal and 20 grams of protein Will need to be mixed to correct consistency   NUTRITION DIAGNOSIS:  Moderate Malnutrition related to poor appetite (in the context of acute illness) as evidenced by energy intake < 75% for > 7 days, mild muscle depletion, mild fat depletion.  GOAL:  Patient will meet greater than or equal to 90% of their needs  MONITOR:  PO intake, Supplement acceptance, Labs, Weight trends  REASON FOR ASSESSMENT:  Consult Calorie Count  ASSESSMENT:  Pt with hx of CAD, COPD, HTN, HLD, CKD3, and DM type 2 presented to ED with AMS, poor appetite, and weakness.   6/23 - presented to ED 6/25 - MBS, DYS3, nectar liquids 6/26 - code stroke called, received TNK, CT negative, MRI showed R pontine infarct, repeat MBS: SLP recommended DYS2/honey thick  Pt resting in bed at the time of assessment, visitor at bedside. Discussed intake since admission and prior. Pt Reports only eating less for a few days prior to admission. However, typical intake sounds lacking and poor nutritional quality.   Breakfast: Skips Lunch: Ham sandwich with mayo, chips as side Dinner: Ham sandwich with mayo, chips as side Beverage: mostly pepsi, will also drink tomato juice  Pt seems to be a picky eater and reported she doesn't like what's on her tray encouraged visitor to assist with meal ordering as she knows what pt likes. Did discuss with pt that if she is not eating she will require a temporary feeding tube to meet her nutrition needs and prevent further  deconditioning.   Pt agreeable to carnation instant breakfast mixed with thickened milk.  Admit weight: 89.6 kg ? Accuracy, copied from last encounter in 2024 Current weight: 85.2 kg    Average Meal Intake: 6/25-6/30: 22% intake x 6 recorded meals  Nutritionally Relevant Medications: Scheduled Meds:  atorvastatin   40 mg Oral Daily   Ensure Plus High Protein  237 mL Oral BID BM   insulin  aspart  0-5 Units Subcutaneous QHS   insulin  aspart  0-9 Units Subcutaneous TID WC   pantoprazole   40 mg Oral Daily   polyethylene glycol  17 g Oral Daily   prednisoLONE acetate  1 drop Left Eye Q1200   senna-docusate  1 tablet Oral Daily   Continuous Infusions:  sodium chloride  100 mL/hr at 02/10/24 0927   PRN Meds: calcium  carbonate,  ondansetron , polyethylene glycol  Labs Reviewed: BUN 36, creatinine 1.88 CBG ranges from 100-141 mg/dL over the last 24 hours HgbA1c 9.2%  NUTRITION - FOCUSED PHYSICAL EXAM: Flowsheet Row Most Recent Value  Orbital Region Mild depletion  Upper Arm Region No depletion  Thoracic and Lumbar Region No depletion  Buccal Region Mild depletion  Temple Region No depletion  Clavicle Bone Region No depletion  Clavicle and Acromion Bone Region Mild depletion  Scapular Bone Region No depletion  Dorsal Hand Mild depletion  Patellar Region Mild depletion  Anterior Thigh Region Mild depletion  Posterior Calf Region Mild depletion  Edema (RD Assessment) Mild  Hair Reviewed  Eyes Reviewed  Mouth Reviewed  Skin Reviewed  Nails Reviewed    Diet Order:  Diet Order             DIET DYS 2 Room service appropriate? Yes with Assist; Fluid consistency: Honey Thick  Diet effective now                   EDUCATION NEEDS:  Education needs have been addressed  Skin:  Skin Assessment: Reviewed RN Assessment  Last BM:  6/30  Height:  Ht Readings from Last 1 Encounters:  02/03/24 5' 2 (1.575 m)    Weight:  Wt Readings from Last 1 Encounters:  02/10/24  85.2 kg    Ideal Body Weight:  50 kg  BMI:  Body mass index is 34.35 kg/m.  Estimated Nutritional Needs:  Kcal:  1500-1700 kcal/d Protein:  75-90g/d Fluid:  1.5-1.7L/d    Vernell Lukes, RD, LDN, CNSC Registered Dietitian II Please reach out via secure chat

## 2024-02-10 NOTE — Progress Notes (Signed)
 Progress Note   Patient: Christina Bennett FMW:[id number] DOB: 1940/11/24 DOA: 02/02/2024     7 DOS: the patient was seen and examined on 02/10/2024 at 9:13AM      Brief hospital course: 83 y.o. F with COPD not on home O2, obesity, CAD, CKD IIIa baseline 0.8-1.1 who presented with dizziness, blurry vision, HA, and decrased appetite.  CXR in the ER showed bilateral opacities, so she was admitted for pneumonia and possible dCHF flare.  After admission, she developed acute left sided weakness overnight, CODE STROKE called, given TNK and transferred to ICU.     Assessment and Plan: * Acute on chronic diastolic CHF (congestive heart failure) (HCC) Treated with Lasix.  With Cr up, limits of diuresis reached.  I suspect her ongoing O2 requirements are from atelectasis.  See below re: pneumonia rule out   AKI (acute kidney injury) (HCC) AKI on CKD III Cr up to 1.8 today from baseline 0.7-0.9 in setting of resumption of losartan  yesterday, poor oral intake. - Stop losartan  - IV fluids - Monitor UOP - Trend Cr  - See below re: dysphagia    CVA (cerebral vascular accident) (HCC) Overnight after admission, developed acute left sided weakness and dysarthria.  MRI brain showed small pontine infarct.  Post-stroke course complicated by dysphagia, see below - Non-invasive angiography showed focal severe right P2 stenosis and moderate vertebral right stenosis - Echocardiogram showed no cardiogenic source of embolism - Carotid imaging unremarkable   - Continue Lipitor - Aspirin allergic - Continue Plavix      Dysphagia Has a history of esophageal stricture.  Since her stroke, she has had marked oropharyngeal dysphagia.  I am uncertain she can actually maintain her oral intake needs by mouth in her current state (given the honey thick liquid restriction).  - Consult dietitian for calorie count starting 7/1 - Strict I/O   - If fails oral intake goals with honey thick by Thursday,  would recommend PEG - Consult Palliative Care - Consult SLP - Dysphagia 2 diet - Honey thick liquids    Morbid obesity (HCC) BMI 36 in the setting of diabetes, hypertension and vascular disease  Acute respiratory failure with hypoxia (HCC) Due to CHF, as above  Pneumonia ruled out Admitted on antibiotics.  No fever, no leukocytosis.  Antibiotics stopped and patient without new fever or leukocytosis or sputum.  Doubt infection.   Headache This was her initial presenting complaint.  CT and MRI of the head showed no acute findings, no hematoma, no dissection.  I am uncertain the etiology.  It appears to wax and wane and migrate (frontal, neck/occipital).   Essential hypertension BP slightly high - Continue losartan   Chronic kidney disease (CKD), stage IIIa (moderate) (HCC)    History of CAD (coronary artery disease) Aspirin allergic - Continue Lipitor, Plavix  Acute metabolic encephalopathy At baseline has no cognitive impairment, lives independently.  Here she has had waxing and waning decreased mentation.  Likely due to stroke.  Today beter   COPD (chronic obstructive pulmonary disease) (HCC) No active flare - Check VBG - Continue Breztri   Non-insulin  dependent type 2 diabetes mellitus (HCC) Uncontrolled.  A1c 9.2% Glucose here mostly controlled - Hold glimepiride - Continue SS corrections  Iron deficiency anemia Hgb stable  Class 1 obesity BMI 34, complicates care       Subjective: Still complaining of headache.  Complains that she cannot eat or drink.  Very little oral intake in the last 24 hours per nursing and SLP.  She is also constipated.  Creatinine up this morning.     Physical Exam: BP (!) 151/57 (BP Location: Right Wrist)   Pulse 75   Temp 98.7 F (37.1 C) (Oral)   Resp 16   Ht 5' 2 (1.575 m)   Wt 85.2 kg Comment: bed weight  SpO2 99%   BMI 34.35 kg/m   Obese adult female, lying in bed, interactive but anxious and tired RRR, no  murmurs, no peripheral edema Respiratory rate normal, lungs diminished but no rales or wheezes Abdomen soft, no focal tenderness, no rigidity or guarding She makes eye contact, but mostly stares away, her responses are slow, she has flaccid left arm, although I see her moving it at times when distracted, her voice is dysarthric    Data Reviewed: Discussed with speech therapy, dietitian Basic metabolic panel shows new renal failure CBC shows mild anemia, unchanged leukocytosis     Family Communication: Niece at the bedside    Disposition: Status is: Inpatient 83 year old F admitted with CHF, headache, developed stroke in the hospital.  She is now unable to keep up with her oral intake needs, we will start IV fluids, and a calorie count.  Over the next 48 hours, if her fluid intake is not adequate with honey thick liquids, I recommend that she have a PEG prior to discharge to SNF        Author: Lonni SHAUNNA Dalton, MD 02/10/2024 4:35 PM  For on call review [email or web address].

## 2024-02-10 NOTE — Progress Notes (Signed)
 Speech Language Pathology Treatment: Dysphagia  Patient Details Name: Christina Bennett MRN: [id number] DOB: 1941-02-27 Today's Date: 02/10/2024 Time: 9070-8985 SLP Time Calculation (min) (ACUTE ONLY): 45 min  Assessment / Plan / Recommendation Clinical Impression  Pt seen again after conversation with MD, who is concerned about intake. Pt's niece, [name], was present today and was educated on current level of function and comparison of two MBS already completed: one before, and one after her stroke. We discussed the acute on chronic nature of her dysphagia.   Attempted advanced trials today after dentures were removed and both dentures and oral cavity were cleaned (coating noted - pt will need more emphasis on oral care). Signs concerning for ongoing dysphagia persist. She has immediate coughing with cup sips of thin liquids and a lot of anterior loss with spoonfuls. She could not masticate an ice chip despite attempts to do so, and when given a very small piece of graham cracker that was softened, she felt like she couldn't take it all as one bite. She still had limited mastication and residue across her tongue and over to her L buccal cavity. I'm not sure she swallowed any of the cracker at all.   Pt is not ready for diet advancement, as she even reports difficulty swallowing honey thick liquids unless given via spoon. Pt is at risk for inadequate nutrition and hydration on current diet. Pt is making requests for certain foods/drinks that would not be available from the cafeteria, but educated was given to [name] about modifications if she is able to bring in outside options. May want to consider palliative care conversation to discuss overall GOC prior to considering longer-term alternative feeding source.    HPI HPI: Patient is an 83 y.o. female with PMH: CAD, COPD, essential HTN, CKD stage III, morbid obesity. She presented to the Ed on 02/03/24 with multiple complaints inclucding generalized  weakeness, HA, poor appetite, chest congestion, blurry vision and family reporting change in her speech. ED evaluation found her to be hemodynamically stable but with BP up to 183/86, UA showed evidence of UTI. CT head negative for acute intracranial abnormality or hemorrhage but did show moderate periventricular WM changes likely reflecting the sequela of small vessel ischemia. CXR showed diffuse interstitial opacities, R>L. While in ED, patient became very restless, anxious and fidgety. Despite multiple doses of Ativan  to attempt MRI,, it was canceled due to patient not cooperating. Non-violent restraints placed. SLP swallow evalauation ordered secondary to family reports of swallowing difficulities as well as RN observing patient to not be able to swallow even smaller pills, and coughing with liquids.  MBSS 6/25 with recs for D3/NTL. Pt with code stroke overnight and reassessed 6/26 with change in swallow function.      SLP Plan  Continue with current plan of care          Recommendations  Diet recommendations: Dysphagia 2 (fine chop);Honey-thick liquid Liquids provided via: Cup;Teaspoon;No straw Medication Administration: Crushed with puree Supervision: Staff to assist with self feeding;Full supervision/cueing for compensatory strategies Compensations: Slow rate;Small sips/bites;Minimize environmental distractions Postural Changes and/or Swallow Maneuvers: Seated upright 90 degrees;Upright 30-60 min after meal                  Oral care BID   Frequent or constant Supervision/Assistance Dysphagia, oropharyngeal phase (R13.12)     Continue with current plan of care     Leita SAILOR., M.A. CCC-SLP Acute Rehabilitation Services Office: [phone]  Secure chat preferred   02/10/2024, 11:34  AM

## 2024-02-10 NOTE — Plan of Care (Signed)
  Problem: Education: Goal: Ability to describe self-care measures that may prevent or decrease complications (Diabetes Survival Skills Education) will improve 02/10/2024 0612 by Gaetana Randall Mathew GORMAN, RN Outcome: Progressing 02/10/2024 0500 by Gaetana Randall Mathew GORMAN, RN Outcome: Progressing Goal: Individualized Educational Video(s) 02/10/2024 0612 by Gaetana Randall Mathew GORMAN, RN Outcome: Progressing 02/10/2024 0500 by Gaetana Randall Mathew GORMAN, RN Outcome: Progressing   Problem: Metabolic: Goal: Ability to maintain appropriate glucose levels will improve 02/10/2024 0612 by Gaetana Randall Mathew GORMAN, RN Outcome: Progressing 02/10/2024 0500 by Gaetana Randall Mathew GORMAN, RN Outcome: Progressing   Problem: Tissue Perfusion: Goal: Adequacy of tissue perfusion will improve 02/10/2024 0612 by Gaetana Randall Mathew GORMAN, RN Outcome: Progressing 02/10/2024 0500 by Gaetana Randall Mathew GORMAN, RN Outcome: Progressing   Problem: Ischemic Stroke/TIA Tissue Perfusion: Goal: Complications of ischemic stroke/TIA will be minimized 02/10/2024 0612 by Gaetana Randall Mathew GORMAN, RN Outcome: Progressing 02/10/2024 0500 by Gaetana Randall Mathew GORMAN, RN Outcome: Progressing

## 2024-02-10 NOTE — Plan of Care (Signed)
  Problem: Clinical Measurements: Goal: Ability to maintain clinical measurements within normal limits will improve Outcome: Progressing Goal: Will remain free from infection Outcome: Progressing Goal: Diagnostic test results will improve Outcome: Progressing Goal: Respiratory complications will improve Outcome: Progressing Goal: Cardiovascular complication will be avoided Outcome: Progressing   Problem: Coping: Goal: Level of anxiety will decrease Outcome: Progressing   Problem: Elimination: Goal: Will not experience complications related to bowel motility Outcome: Progressing Goal: Will not experience complications related to urinary retention Outcome: Progressing   Problem: Pain Managment: Goal: General experience of comfort will improve and/or be controlled Outcome: Progressing   Problem: Safety: Goal: Ability to remain free from injury will improve Outcome: Progressing   Problem: Skin Integrity: Goal: Risk for impaired skin integrity will decrease Outcome: Progressing   Problem: Respiratory: Goal: Ability to maintain adequate ventilation will improve Outcome: Progressing Goal: Ability to maintain a clear airway will improve Outcome: Progressing

## 2024-02-10 NOTE — Plan of Care (Signed)
 Been trying to encourage her to do more for herself.  Is able to feed self but refuses to because nieces feed her.  Will shout out one word needs and expect staff and family to fulfill them ( example--hot, means it is hot and remove covers; drink, means she needs a drink; hurt, means she is in pain; turn, when she needs to be repositioned.    Problem: Education: Goal: Ability to describe self-care measures that may prevent or decrease complications (Diabetes Survival Skills Education) will improve Outcome: Progressing Goal: Individualized Educational Video(s) Outcome: Progressing   Problem: Coping: Goal: Ability to adjust to condition or change in health will improve Outcome: Progressing   Problem: Fluid Volume: Goal: Ability to maintain a balanced intake and output will improve Outcome: Progressing   Problem: Health Behavior/Discharge Planning: Goal: Ability to identify and utilize available resources and services will improve Outcome: Progressing Goal: Ability to manage health-related needs will improve Outcome: Progressing   Problem: Metabolic: Goal: Ability to maintain appropriate glucose levels will improve Outcome: Progressing   Problem: Nutritional: Goal: Maintenance of adequate nutrition will improve Outcome: Progressing Goal: Progress toward achieving an optimal weight will improve Outcome: Progressing   Problem: Skin Integrity: Goal: Risk for impaired skin integrity will decrease Outcome: Progressing   Problem: Tissue Perfusion: Goal: Adequacy of tissue perfusion will improve Outcome: Progressing   Problem: Education: Goal: Knowledge of General Education information will improve Description: Including pain rating scale, medication(s)/side effects and non-pharmacologic comfort measures Outcome: Progressing   Problem: Health Behavior/Discharge Planning: Goal: Ability to manage health-related needs will improve Outcome: Progressing   Problem: Clinical  Measurements: Goal: Ability to maintain clinical measurements within normal limits will improve Outcome: Progressing Goal: Will remain free from infection Outcome: Progressing Goal: Diagnostic test results will improve Outcome: Progressing Goal: Respiratory complications will improve Outcome: Progressing Goal: Cardiovascular complication will be avoided Outcome: Progressing   Problem: Activity: Goal: Risk for activity intolerance will decrease Outcome: Progressing   Problem: Nutrition: Goal: Adequate nutrition will be maintained Outcome: Progressing   Problem: Coping: Goal: Level of anxiety will decrease Outcome: Progressing   Problem: Elimination: Goal: Will not experience complications related to bowel motility Outcome: Progressing Goal: Will not experience complications related to urinary retention Outcome: Progressing   Problem: Pain Managment: Goal: General experience of comfort will improve and/or be controlled Outcome: Progressing   Problem: Safety: Goal: Ability to remain free from injury will improve Outcome: Progressing   Problem: Skin Integrity: Goal: Risk for impaired skin integrity will decrease Outcome: Progressing   Problem: Activity: Goal: Ability to tolerate increased activity will improve Outcome: Progressing   Problem: Clinical Measurements: Goal: Ability to maintain a body temperature in the normal range will improve Outcome: Progressing   Problem: Respiratory: Goal: Ability to maintain adequate ventilation will improve Outcome: Progressing Goal: Ability to maintain a clear airway will improve Outcome: Progressing   Problem: Education: Goal: Knowledge of disease or condition will improve Outcome: Progressing Goal: Knowledge of secondary prevention will improve (MUST DOCUMENT ALL) Outcome: Progressing Goal: Knowledge of patient specific risk factors will improve (DELETE if not current risk factor) Outcome: Progressing   Problem:  Ischemic Stroke/TIA Tissue Perfusion: Goal: Complications of ischemic stroke/TIA will be minimized Outcome: Progressing   Problem: Coping: Goal: Will verbalize positive feelings about self Outcome: Progressing Goal: Will identify appropriate support needs Outcome: Progressing   Problem: Health Behavior/Discharge Planning: Goal: Ability to manage health-related needs will improve Outcome: Progressing Goal: Goals will be collaboratively established with patient/family Outcome: Progressing  Problem: Self-Care: Goal: Ability to participate in self-care as condition permits will improve Outcome: Progressing Goal: Verbalization of feelings and concerns over difficulty with self-care will improve Outcome: Progressing Goal: Ability to communicate needs accurately will improve Outcome: Progressing   Problem: Nutrition: Goal: Risk of aspiration will decrease Outcome: Progressing Goal: Dietary intake will improve Outcome: Progressing

## 2024-02-11 ENCOUNTER — Inpatient Hospital Stay (HOSPITAL_COMMUNITY)

## 2024-02-11 DIAGNOSIS — Z66 Do not resuscitate: Secondary | ICD-10-CM

## 2024-02-11 DIAGNOSIS — Z515 Encounter for palliative care: Secondary | ICD-10-CM | POA: Diagnosis not present

## 2024-02-11 DIAGNOSIS — I5033 Acute on chronic diastolic (congestive) heart failure: Secondary | ICD-10-CM | POA: Diagnosis not present

## 2024-02-11 DIAGNOSIS — Z7189 Other specified counseling: Secondary | ICD-10-CM

## 2024-02-11 LAB — BASIC METABOLIC PANEL WITH GFR
Anion gap: 9 (ref 5–15)
BUN: 32 mg/dL — ABNORMAL HIGH (ref 8–23)
CO2: 23 mmol/L (ref 22–32)
Calcium: 8.1 mg/dL — ABNORMAL LOW (ref 8.9–10.3)
Chloride: 112 mmol/L — ABNORMAL HIGH (ref 98–111)
Creatinine, Ser: 1.31 mg/dL — ABNORMAL HIGH (ref 0.44–1.00)
GFR, Estimated: 41 mL/min — ABNORMAL LOW (ref >60)
Glucose, Bld: 120 mg/dL — ABNORMAL HIGH (ref 70–99)
Potassium: 3.6 mmol/L (ref 3.5–5.1)
Sodium: 144 mmol/L (ref 135–145)

## 2024-02-11 LAB — CBC
HCT: 40 % (ref 36.0–46.0)
Hemoglobin: 11.3 g/dL — ABNORMAL LOW (ref 12.0–15.0)
MCH: 24.7 pg — ABNORMAL LOW (ref 26.0–34.0)
MCHC: 28.3 g/dL — ABNORMAL LOW (ref 30.0–36.0)
MCV: 87.5 fL (ref 80.0–100.0)
Platelets: 185 10*3/uL (ref 150–400)
RBC: 4.57 MIL/uL (ref 3.87–5.11)
RDW: 17.2 % — ABNORMAL HIGH (ref 11.5–15.5)
WBC: 10 10*3/uL (ref 4.0–10.5)
nRBC: 0 % (ref 0.0–0.2)

## 2024-02-11 LAB — GLUCOSE, CAPILLARY
Glucose-Capillary: 127 mg/dL — ABNORMAL HIGH (ref 70–99)
Glucose-Capillary: 120 mg/dL — ABNORMAL HIGH (ref 70–99)
Glucose-Capillary: 152 mg/dL — ABNORMAL HIGH (ref 70–99)
Glucose-Capillary: 123 mg/dL — ABNORMAL HIGH (ref 70–99)

## 2024-02-11 MED ORDER — ENOXAPARIN SODIUM 40 MG/0.4ML IJ SOSY
40.0000 mg | PREFILLED_SYRINGE | INTRAMUSCULAR | Status: DC
  Administered 2024-02-11 – 2024-02-24 (×13): 40 mg via SUBCUTANEOUS
  Filled 2024-02-11 (×14): qty 0.4

## 2024-02-11 NOTE — Plan of Care (Signed)
  Problem: Nutritional: Goal: Maintenance of adequate nutrition will improve Outcome: Progressing Goal: Progress toward achieving an optimal weight will improve Outcome: Progressing   Problem: Skin Integrity: Goal: Risk for impaired skin integrity will decrease Outcome: Progressing   Problem: Clinical Measurements: Goal: Ability to maintain clinical measurements within normal limits will improve Outcome: Progressing Goal: Will remain free from infection Outcome: Progressing Goal: Diagnostic test results will improve Outcome: Progressing Goal: Respiratory complications will improve Outcome: Progressing Goal: Cardiovascular complication will be avoided Outcome: Progressing   Problem: Elimination: Goal: Will not experience complications related to bowel motility Outcome: Progressing Goal: Will not experience complications related to urinary retention Outcome: Progressing   Problem: Pain Managment: Goal: General experience of comfort will improve and/or be controlled Outcome: Progressing   Problem: Safety: Goal: Ability to remain free from injury will improve Outcome: Progressing

## 2024-02-11 NOTE — Progress Notes (Signed)
 Occupational Therapy Treatment Patient Details Name: Christina Bennett MRN: [id number] DOB: 09-24-1940 Today's Date: 02/11/2024   History of present illness The pt is an 83 yo female presenting 6/23 with AMS. Initial work up with concern for UTI, pt given antibiotics and returned to baseline. Code stroke called 1AM 6/26, pt given TNK and transferred to ICU. 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 PMH includes: COPD, DM II, DVT, HTN, arthritis.   OT comments  Cotx session with PT, pt noted to be improving in therapy sessions. Her LUE is developing flexor tone and she has some return of muscle activation listed below. MD Gherghe approved L resting hand splint to reduce risk of hand contracture. Pt able to take steps to pivot to recliner today with max A+2 and multimodal cues for anterior pelvic tilt and LLE offload initiation. OT to follow-up with pt as able and determine splint wear schedule and tolerance. Patient will benefit from continued inpatient follow up therapy, <3 hours/day       If plan is discharge home, recommend the following:  Two people to help with walking and/or transfers;A lot of help with bathing/dressing/bathroom;Assistance with feeding;Assistance with Charity fundraiser cushion (measurements OT);Wheelchair (measurements OT)    Recommendations for Other Services      Precautions / Restrictions Precautions Precautions: Fall Recall of Precautions/Restrictions: Impaired Precaution/Restrictions Comments: SBP <180 Restrictions Weight Bearing Restrictions Per Provider Order: No       Mobility Bed Mobility Overal bed mobility: Needs Assistance Bed Mobility: Supine to Sit, Sit to Supine     Supine to sit: Mod assist, +2 for physical assistance     General bed mobility comments: mod A +2 for BLE management and raise trunk     Transfers Overall transfer level: Needs assistance Equipment used: 2 person hand held assist Transfers: Sit to/from Stand, Bed to chair/wheelchair/BSC Sit to Stand: Mod assist, +2 physical assistance Stand pivot transfers: Max assist, +2 safety/equipment, +2 physical assistance         General transfer comment: Mod A +2 to rise into standing. tactile cues for glute activation and LLE initiion for mobility. Max A+2 for pivot     Balance Overall balance assessment: Needs assistance Sitting-balance support: Feet supported Sitting balance-Leahy Scale: Fair Sitting balance - Comments: min A to CGA sitting EOB, anterior sway.   Standing balance support: Bilateral upper extremity supported Standing balance-Leahy Scale: Poor Standing balance comment: reliant on therapists.                           ADL either performed or assessed with clinical judgement   ADL Overall ADL's : Needs assistance/impaired     Grooming: Sitting;Minimal assistance;Wash/dry face Grooming Details (indicate cue type and reason): a little hand over hand assist for some initiation of task, pt would only put washcloth up to her lip at first. Cued pt to use L and r hand to wash face. Upper Body Bathing: Sitting;Moderate assistance   Lower Body Bathing: Sitting/lateral leans;Moderate assistance       Lower Body Dressing: Total assistance;Bed level               Functional mobility during ADLs: +2 for physical assistance;+2 for safety/equipment;Maximal assistance      Extremity/Trunk Assessment Upper Extremity Assessment LUE Deficits / Details: moving in flexor synergy pattern, traps firing and  activation of biceps/triceps for elbow movement. Has shoulder adduction, protraction>retraction and very loose/weak grasp. no digit ext, fingers resting in flexed posture. LUE Coordination: decreased fine motor;decreased gross motor            Vision Baseline Vision/History:  (baseline vision  impariment to L eye.) Eye Alignment: Within Functional Limits Ocular Range of Motion: Within Functional Limits Tracking/Visual Pursuits: Impaired - to be further tested in functional context (decreased smoothness throughout, pt not fixating gaze on object in all directions initially. After repetitive attempts to one area at a time no particular deficits noted with tracking) Visual Rusconi: No apparent deficits Additional Comments: baseline vision impariment to L eye. She denies blurry or duoble vision   Perception Perception Perception: Within Functional Limits   Praxis Praxis Praxis: WFL   Communication Communication Communication: Impaired Factors Affecting Communication: Reduced clarity of speech   Cognition Arousal: Alert Behavior During Therapy: WFL for tasks assessed/performed Cognition: No apparent impairments                               Following commands: Impaired Following commands impaired: Follows one step commands with increased time      Cueing   Cueing Techniques: Verbal cues, Tactile cues  Exercises      Shoulder Instructions       General Comments Sp02 92% on 1L, pt c/o bottom pain-notified RN. MD approved order of L resting hand splint.    Pertinent Vitals/ Pain       Pain Assessment Pain Assessment: Faces Faces Pain Scale: Hurts little more Pain Location: bottom Pain Descriptors / Indicators: Aching, Discomfort Pain Intervention(s): Monitored during session, Limited activity within patient's tolerance  Home Living                                          Prior Functioning/Environment              Frequency  Min 2X/week        Progress Toward Goals  OT Goals(current goals can now be found in the care plan section)  Progress towards OT goals: Progressing toward goals  Acute Rehab OT Goals Patient Stated Goal: REturn home OT Goal Formulation: With patient Time For Goal Achievement: 02/20/24 Potential  to Achieve Goals: Good  Plan      Co-evaluation      Reason for Co-Treatment: Complexity of the patient's impairments (multi-system involvement);For patient/therapist safety;To address functional/ADL transfers PT goals addressed during session: Mobility/safety with mobility;Balance;Strengthening/ROM OT goals addressed during session: ADL's and self-care;Strengthening/ROM      AM-PAC OT 6 Clicks Daily Activity     Outcome Measure   Help from another person eating meals?: A Little Help from another person taking care of personal grooming?: A Little Help from another person toileting, which includes using toliet, bedpan, or urinal?: A Lot Help from another person bathing (including washing, rinsing, drying)?: A Lot Help from another person to put on and taking off regular upper body clothing?: A Lot Help from another person to put on and taking off regular lower body clothing?: Total 6 Click Score: 13    End of Session Equipment Utilized During Treatment: Gait belt  OT Visit Diagnosis: Unsteadiness on feet (R26.81);Muscle weakness (generalized) (M62.81);Cognitive communication deficit (R41.841);Hemiplegia and hemiparesis Symptoms and signs involving cognitive functions: Cerebral infarction Hemiplegia - Right/Left: Left Hemiplegia -  dominant/non-dominant: Non-Dominant Hemiplegia - caused by: Cerebral infarction   Activity Tolerance Patient tolerated treatment well   Patient Left in chair;with call bell/phone within reach;with chair alarm set   Nurse Communication Mobility status        Time: 8887-8853 OT Time Calculation (min): 34 min  Charges: OT General Charges $OT Visit: 1 Visit OT Treatments $Therapeutic Activity: 8-22 mins  02/11/2024  AB, OTR/L  Acute Rehabilitation Services  Office: [phone]   [name] 02/11/2024, 12:11 PM

## 2024-02-11 NOTE — Plan of Care (Signed)
  Problem: Nutritional: Goal: Maintenance of adequate nutrition will improve Outcome: Progressing   Problem: Skin Integrity: Goal: Risk for impaired skin integrity will decrease Outcome: Progressing   Problem: Activity: Goal: Risk for activity intolerance will decrease Outcome: Progressing   Problem: Elimination: Goal: Will not experience complications related to bowel motility Outcome: Progressing

## 2024-02-11 NOTE — Consult Note (Addendum)
 Palliative Medicine Inpatient Consult Note  Consulting Provider:  Jonel Lonni SQUIBB, MD   Reason for consult:   Palliative Care Consult Services Palliative Medicine Consult  Reason for Consult? Does patient want PEG?   02/11/2024  HPI:  Per intake H&P --> 83 y.o. F with COPD not on home O2, obesity, CAD, CKD IIIa baseline 0.8-1.1 who presented with dizziness, blurry vision, HA, and decrased appetite. The palliative team has been asked to support additional conversations on goals of care conversations.    Clinical Assessment/Goals of Care:  *Please note that this is a verbal dictation therefore any spelling or grammatical errors are due to the Dragon Medical One system interpretation.  I have reviewed medical records including EPIC notes, labs and imaging, received report from bedside RN, assessed the patient who is lying in bed in NAD.    I met with Christina Bennett to further discuss diagnosis prognosis, GOC, EOL wishes, disposition and options.   I introduced Palliative Medicine as specialized medical care for people living with serious illness. It focuses on providing relief from the symptoms and stress of a serious illness. The goal is to improve quality of life for both the patient and the family.  Medical History Review and Understanding:  A review of Christina Bennett past medical history significant for COPD, coronary artery disease, obesity, stage III chronic kidney disease, and hypertension was completed.  Social History:  Christina Bennett is from [place], [place] .  She shares that she is not married.  She had 2 siblings who are both deceased.  She has 2 nieces, Christina Bennett and Christina with whom she is quite close to.  She notes that she used to love riding horses when she was healthier.  She is a woman of the [employer].  Functional and Nutritional State:  Preceding hospitalization, Christina Bennett was fully functional of all B ADLs and IADLs.  She did have a robust appetite and enjoyed  eating and drinking.  Advance Directives:  A detailed discussion was had today regarding advanced directives.  Christina Bennett shares that her designated parties for decision making is her niece, Christina Bennett.  Code Status:  Concepts specific to code status, artifical feeding and hydration, continued IV antibiotics and rehospitalization was had.  The difference between a aggressive medical intervention path  and a palliative comfort care path for this patient at this time was had.   Encouraged patient/family to consider DNR/DNI status understanding evidenced based poor outcomes in similar hospitalized patient, as the cause of arrest is likely associated with advanced chronic/terminal illness rather than an easily reversible acute cardio-pulmonary event. I explained that DNR/DNI does not change the medical plan and it only comes into effect after a person has arrested (died).  It is a protective measure to keep us  from harming the patient in their last moments of life. Peyton was agreeable to DNR/DNI with understanding that patient would not receive CPR, defibrillation, ACLS medications, or intubation.   Discussion:  Christina Bennett and I discussed how difficult suffering from a stroke is from a functional, nutritional, and emotional perspective. Christina Bennett expresses loss of her ability to use her left side.  We also discussed the difficulty in not being able to swallow any longer further inhibiting the enjoyment of food.  We reviewed the plan at this time which is to see how much Christina Bennett can eat over these 48 hours and if this is insufficient whether or not she would like a gastrostomy tube to be placed for longer-term nutrition while she tries to rehabilitate.  [name] shares that she is not sure if this is something she would want or not.    We dicussed what  a gastrostomy tube is, A gastrostomy tube, often called a G-tube, is a surgically placed tube that provides access to the stomach for feeding, hydration,  or medication administration. It's inserted through the abdominal wall and into the stomach, typically when a person cannot eat or drink adequately by mouth. G-tubes can be temporary or permanent. We reviewed what having a gastrostomy tube would entail and how it would affect patient's quality of life.  Christina Bennett continues to think about whether or not she would want this intervention.  She plans to speak to her nieces about this further in the oncoming day. Christina Bennett is aware that a decision will need to be made likely on Thursday to determine the next steps.  Discussed the importance of continued conversation with family and their  medical providers regarding overall plan of care and treatment options, ensuring decisions are within the context of the patients values and GOCs. __________________________ Addendum:  I was able to call Christina Bennett' niece Christina Bennett and discussed the above conversation.  Christina Bennett is aware of patient's wishes to not be resuscitated and she is in agreement with this decision.  Christina Bennett shares that whenever determinations Christina Bennett feel are best for her in regards to a gastrostomy tube she will support. _____________________________ Addendum:  I met at bedside wit patients other niece, Christina Bennett. Updated her to the above. We discussed the difficulties of strokes and how recovery is hard to determine.   Christina Bennett understands that a decision needs to be made on a PEG in the oncoming day(s).  Decision Maker: Christina Bennett (Niece): [phone] ([place])   SUMMARY OF RECOMMENDATIONS   DNAR/DNI  Open and honest conversations held in the setting of patient's stroke and the debility she is experiencing most notably her dysphagia  Ongoing conversations about whether or not [name] would like a gastrostomy tube placed  Palliative care will continue to see [name] and offer support during this difficult time  Code Status/Advance Care Planning: DNAR/DNI  Palliative Prophylaxis:  Aspiration,  Bowel Regimen, Delirium Protocol, Frequent Pain Assessment, Oral Care, Palliative Wound Care, and Turn Reposition  Additional Recommendations (Limitations, Scope, Preferences): Continue current care  Psycho-social/Spiritual:  Desire for further Chaplaincy support: Yes - patient is [employer] Additional Recommendations: Education on chronic disease burden   Prognosis: Will depend on if patients elects a G-tube or not.  Discharge Planning: Discharge will likely be to rehabilitation.   Vitals:   02/11/24 0040 02/11/24 0410  BP: (!) 147/61 (!) 149/59  Pulse: 61 63  Resp:  16  Temp:  97.8 F (36.6 C)  SpO2:  94%    Intake/Output Summary (Last 24 hours) at 02/11/2024 9276 Last data filed at 02/11/2024 9373 Gross per 24 hour  Intake 2248.5 ml  Output 1250 ml  Net 998.5 ml   Last Weight  Most recent update: 02/10/2024 10:52 AM    Weight  85.2 kg (187 lb 13.3 oz)            Gen:  Elderly Caucasian F in NAD HEENT: moist mucous membranes CV: Regular rate and rhythm  PULM: On 1LPM [place], breathing is even and nonlabored ABD: soft/nontender  EXT: No edema  Neuro: Alert and oriented x3   PPS: 10%   This conversation/these recommendations were discussed with patient primary care team, Dr. Trixie ______________________________________________________ Rosaline [facility] Palliative Medicine Team Team Cell Phone: [phone] Please utilize secure chat with  additional questions, if there is no response within 30 minutes please call the above phone number  Total Time: 75 Billing based on MDM: High  Palliative Medicine Team providers are available by phone from 7am to 7pm daily and can be reached through the team cell phone.  Should this patient require assistance outside of these hours, please call the patient's attending physician.

## 2024-02-11 NOTE — Progress Notes (Signed)
 PROGRESS NOTE  Christina Bennett FMW:[id number] DOB: 07-May-1941 DOA: 02/02/2024 PCP: Ransom Other, MD   LOS: 8 days   Brief Narrative / Interim history: 83 y.o. F with COPD not on home O2, obesity, CAD, CKD IIIa baseline 0.8-1.1 who presented with dizziness, blurry vision, HA, and decrased appetite. CXR in the ER showed bilateral opacities, so she was admitted for pneumonia and possible dCHF flare. After admission, she developed acute left sided weakness overnight, CODE STROKE called, given TNK and transferred to ICU.  Subjective / 24h Interval events: No specific complaints for me.  Seems confused  Assesement and Plan: Principal Problem:   Acute on chronic diastolic CHF (congestive heart failure) (HCC) Active Problems:   AKI (acute kidney injury) (HCC)   Dysphagia   Iron deficiency anemia   Non-insulin  dependent type 2 diabetes mellitus (HCC)   COPD (chronic obstructive pulmonary disease) (HCC)   Acute metabolic encephalopathy   History of CAD (coronary artery disease)   Chronic kidney disease (CKD), stage IIIa (moderate) (HCC)   Essential hypertension   CVA (cerebral vascular accident) (HCC)   Acute respiratory failure with hypoxia (HCC)   Morbid obesity (HCC)   Malnutrition of moderate degree   Principal problem Acute on chronic diastolic CHF -patient admitted to the hospital with fluid overload, received Lasix, this has been discontinued given elevation in the creatinine.  Creatinine now stabilized, may need to put on low-dose torsemide tomorrow if continues to improve    Active problems AKI on CKD IIIa  - Cr up to 1.8 on 7/1 from baseline 0.7-0.9, possibly in the setting of diuresis, losartan .  Received fluids, creatinine better today at 1.3.  Will see her p.o. intake before deciding if torsemide is appropriate   CVA (cerebral vascular accident) (HCC) - Overnight after admission, developed acute left sided weakness and dysarthria. MRI brain showed small pontine infarct.  Post-stroke course complicated by dysphagia. Non-invasive angiography showed focal severe right P2 stenosis and moderate vertebral right stenosis. Echocardiogram showed no cardiogenic source of embolism. Carotid imaging unremarkable. Continue Lipitor, Plavix, she is allergic to aspirin.  PT recommends SNF  Dysphagia - Has a history of esophageal stricture.  Since her stroke, she has had marked oropharyngeal dysphagia.  I am uncertain she can actually maintain her oral intake needs by mouth in her current state (given the honey thick liquid restriction).  Calorie count started 7/1, strict in and outs - May need PEG if fails oral intake, palliative care consulted as well -SLP consulted    Acute respiratory failure with hypoxia (HCC) - Due to CHF, as above   Pneumonia ruled out - Admitted on antibiotics.  No fever, no leukocytosis.  Antibiotics stopped and patient without new fever or leukocytosis or sputum.  Doubt infection.   Headache- This was her initial presenting complaint.  CT and MRI of the head showed no acute findings, no hematoma, no dissection.  I am uncertain the etiology.  It appears to wax and wane and migrate (frontal, neck/occipital).   Essential hypertension - BP slightly high.  Was on losartan , now discontinued due to AKI.  Monitor blood pressure   History of CAD (coronary artery disease) - Aspirin allergic. Continue Lipitor, Plavix   Acute metabolic encephalopathy - At baseline has no cognitive impairment, lives independently.  Here she has had waxing and waning decreased mentation.  Likely due to stroke.    COPD (chronic obstructive pulmonary disease) (HCC) - No active flare, continue home inhaler, no wheezing   Non-insulin  dependent type  2 diabetes mellitus (HCC) - Uncontrolled.  A1c 9.2%.  Continue sliding scale   Iron deficiency anemia - Hgb stable   Class 1 obesity - BMI 34, complicates care  Scheduled Meds:  artificial tears  1 drop Left Eye Daily   atorvastatin   40  mg Oral Daily   budesonide -glycopyrrolate -formoterol   2 puff Inhalation BID   Chlorhexidine  Gluconate Cloth  6 each Topical Daily   clopidogrel  75 mg Oral Daily   clotrimazole  1 Applicatorful Vaginal QHS   enoxaparin (LOVENOX) injection  30 mg Subcutaneous Q24H   feeding supplement  237 mL Oral BID BM   insulin  aspart  0-5 Units Subcutaneous QHS   insulin  aspart  0-9 Units Subcutaneous TID WC   montelukast   10 mg Oral QPM   pantoprazole   40 mg Oral Daily   polyethylene glycol  17 g Oral Daily   prednisoLONE acetate  1 drop Left Eye Q1200   senna-docusate  1 tablet Oral Daily   sodium chloride  flush  3 mL Intravenous Q12H   sodium chloride  flush  3-10 mL Intravenous Q12H   valACYclovir  1,000 mg Oral Daily   Continuous Infusions: PRN Meds:.acetaminophen , bisacodyl, calcium  carbonate, dextrose , diphenhydrAMINE , haloperidol  lactate, hydrALAZINE , HYDROcodone -acetaminophen , ipratropium-albuterol , labetalol , melatonin, ondansetron  **OR** ondansetron  (ZOFRAN ) IV, mouth rinse, polyethylene glycol, sodium chloride  flush, sodium chloride  flush  Current Outpatient Medications  Medication Instructions   albuterol  (PROVENTIL  HFA;VENTOLIN  HFA) 108 (90 BASE) MCG/ACT inhaler 2 puffs, Every 4 hours PRN   BREZTRI  AEROSPHERE 160-9-4.8 MCG/ACT AERO 2 puffs, 2 times daily   fluticasone (FLONASE) 50 MCG/ACT nasal spray 2 sprays, Every evening   glimepiride (AMARYL) 2 mg, 2 times daily   HYDROcodone -acetaminophen  (NORCO) 7.5-325 MG tablet 1 tablet, 2 times daily PRN   losartan  (COZAAR ) 100 mg, Daily   montelukast  (SINGULAIR ) 10 mg, Every evening   pantoprazole  (PROTONIX ) 40 mg, Daily   polyvinyl alcohol (LIQUIFILM TEARS) 1.4 % ophthalmic solution 1 drop, Daily   prednisoLONE acetate (PRED FORTE) 1 % ophthalmic suspension 1 drop, Daily   simvastatin  (ZOCOR ) 40 mg, Every evening   valACYclovir (VALTREX) 1,000 mg, Daily   Vitamin D (Ergocalciferol) (DRISDOL) 50,000 Units, Every 14 days    Diet Orders  (From admission, onward)     Start     Ordered   02/05/24 1601  DIET DYS 2 Room service appropriate? Yes with Assist; Fluid consistency: Honey Thick  Diet effective now       Question Answer Comment  Room service appropriate? Yes with Assist   Fluid consistency: Honey Thick      02/05/24 1600            DVT prophylaxis: enoxaparin (LOVENOX) injection 30 mg Start: 02/10/24 2200 SCD's Start: 02/05/24 0218 SCDs Start: 02/03/24 0132 Place TED hose Start: 02/03/24 0132   Lab Results  Component Value Date   PLT 185 02/11/2024      Code Status: Full Code  Family Communication: no family at bedside   Status is: Inpatient Remains inpatient appropriate because: severity of illness  Level of care: Med-Surg  Consultants:  Palliative Neurology   Objective: Vitals:   02/11/24 0040 02/11/24 0410 02/11/24 0742 02/11/24 0900  BP: (!) 147/61 (!) 149/59 (!) 155/64   Pulse: 61 63 (!) 55   Resp:  16 16   Temp:  97.8 F (36.6 C) 98.4 F (36.9 C)   TempSrc:  Oral Oral   SpO2:  94% 99% 99%  Weight:      Height:  Intake/Output Summary (Last 24 hours) at 02/11/2024 1027 Last data filed at 02/11/2024 9373 Gross per 24 hour  Intake 2188.5 ml  Output 1050 ml  Net 1138.5 ml   Wt Readings from Last 3 Encounters:  02/10/24 85.2 kg  09/19/22 89.6 kg  03/20/22 86.4 kg    Examination:  Constitutional: NAD Eyes: no scleral icterus ENMT: Mucous membranes are moist.  Neck: normal, supple Respiratory: clear to auscultation bilaterally, no wheezing, no crackles.  Cardiovascular: Regular rate and rhythm, no murmurs / rubs / gallops. No LE edema.  Abdomen: non distended, no tenderness. Bowel sounds positive.  Musculoskeletal: no clubbing / cyanosis.   Data Reviewed: I have independently reviewed following labs and imaging studies   CBC Recent Labs  Lab 02/05/24 0448 02/06/24 9364 02/07/24 0536 02/08/24 0734 02/09/24 0452 02/10/24 0450 02/11/24 0452  WBC 13.6* 11.6*  13.7* 12.6* 12.8* 12.5* 10.0  HGB 12.2 11.8* 12.0 11.5* 12.2 11.8* 11.3*  HCT 41.0 40.5 40.7 40.0 42.2 40.5 40.0  PLT 270 216 216 199 199 196 185  MCV 85.2 84.9 87.2 88.5 87.2 89.0 87.5  MCH 25.4* 24.7* 25.7* 25.4* 25.2* 25.9* 24.7*  MCHC 29.8* 29.1* 29.5* 28.8* 28.9* 29.1* 28.3*  RDW 17.2* 16.9* 17.2* 17.2* 17.1* 17.2* 17.2*  LYMPHSABS 2.2 1.7 2.0 1.9  --   --   --   MONOABS 1.2* 1.0 1.1* 0.9  --   --   --   EOSABS 0.1 0.2 0.5 0.4  --   --   --   BASOSABS 0.1 0.0 0.1 0.1  --   --   --     Recent Labs  Lab 02/05/24 0448 02/06/24 0635 02/07/24 0536 02/08/24 0734 02/09/24 0452 02/10/24 0450 02/11/24 0452  NA 138 140 143 142 143 145 144  K 3.9 3.7 4.0 4.1 3.9 4.0 3.6  CL 106 104 109 110 105 107 112*  CO2 21* 24 23 21* 27 31 23   GLUCOSE 123* 137* 115* 111* 113* 124* 120*  BUN 23 19 23  25* 22 36* 32*  CREATININE 0.96 0.88 0.88 0.94 0.88 1.88* 1.31*  CALCIUM  8.9 8.8* 8.6* 8.4* 9.0 8.9 8.1*  AST  --   --   --   --  25 27  --   ALT  --   --   --   --  13 11  --   ALKPHOS  --   --   --   --  75 70  --   BILITOT  --   --   --   --  0.9 0.6  --   ALBUMIN  --   --   --   --  3.0* 2.9*  --   MG 2.0 2.2 2.3 2.3  --   --   --     ------------------------------------------------------------------------------------------------------------------ No results for input(s): CHOL, HDL, LDLCALC, TRIG, CHOLHDL, LDLDIRECT in the last 72 hours.  Lab Results  Component Value Date   HGBA1C 9.2 (H) 02/03/2024   ------------------------------------------------------------------------------------------------------------------ No results for input(s): TSH, T4TOTAL, T3FREE, THYROIDAB in the last 72 hours.  Invalid input(s): FREET3  Cardiac Enzymes No results for input(s): CKMB, TROPONINI, MYOGLOBIN in the last 168 hours.  Invalid input(s): CK ------------------------------------------------------------------------------------------------------------------     Component Value Date/Time   BNP 43.6 02/04/2024 0312    CBG: Recent Labs  Lab 02/10/24 0611 02/10/24 1154 02/10/24 1633 02/10/24 2122 02/11/24 0624  GLUCAP 131* 121* 121* 126* 127*    Recent Results (from the past 240 hours)  Urine Culture     Status: Abnormal   Collection Time: 02/02/24  9:11 PM   Specimen: Urine, Random  Result Value Ref Range Status   Specimen Description URINE, RANDOM  Final   Special Requests   Final    NONE Reflexed from [id number] Performed at [facility], [address] [place], [name] [address]    Culture MULTIPLE SPECIES PRESENT, SUGGEST RECOLLECTION (A)  Final   Report Status 02/03/2024 FINAL  Final  Respiratory (~20 pathogens) panel by PCR     Status: None   Collection Time: 02/03/24  1:37 AM   Specimen: Nasopharyngeal Swab; Respiratory  Result Value Ref Range Status   Adenovirus NOT DETECTED NOT DETECTED Final   Coronavirus 229E NOT DETECTED NOT DETECTED Final    Comment: (NOTE) The Coronavirus on the Respiratory Panel, DOES NOT test for the novel  Coronavirus (2019 nCoV)    Coronavirus HKU1 NOT DETECTED NOT DETECTED Final   Coronavirus NL63 NOT DETECTED NOT DETECTED Final   Coronavirus OC43 NOT DETECTED NOT DETECTED Final   Metapneumovirus NOT DETECTED NOT DETECTED Final   Rhinovirus / Enterovirus NOT DETECTED NOT DETECTED Final   Influenza A NOT DETECTED NOT DETECTED Final   Influenza B NOT DETECTED NOT DETECTED Final   Parainfluenza Virus 1 NOT DETECTED NOT DETECTED Final   Parainfluenza Virus 2 NOT DETECTED NOT DETECTED Final   Parainfluenza Virus 3 NOT DETECTED NOT DETECTED Final   Parainfluenza Virus 4 NOT DETECTED NOT DETECTED Final   Respiratory Syncytial Virus NOT DETECTED NOT DETECTED Final   Bordetella pertussis NOT DETECTED NOT DETECTED Final   Bordetella Parapertussis NOT DETECTED NOT DETECTED Final   Chlamydophila pneumoniae NOT DETECTED NOT DETECTED Final   Mycoplasma pneumoniae NOT DETECTED NOT DETECTED Final     Comment: Performed at [facility], [address], [place], [name] [address]  Culture, blood (routine x 2) Call MD if unable to obtain prior to antibiotics being given     Status: None   Collection Time: 02/03/24  4:10 AM   Specimen: BLOOD  Result Value Ref Range Status   Specimen Description BLOOD SITE NOT SPECIFIED  Final   Special Requests   Final    BOTTLES DRAWN AEROBIC AND ANAEROBIC Blood Culture adequate volume   Culture   Final    NO GROWTH 5 DAYS Performed at [facility], [address] [place], [name] [address]    Report Status 02/08/2024 FINAL  Final  Culture, blood (routine x 2) Call MD if unable to obtain prior to antibiotics being given     Status: None   Collection Time: 02/03/24  4:18 AM   Specimen: BLOOD  Result Value Ref Range Status   Specimen Description BLOOD SITE NOT SPECIFIED  Final   Special Requests   Final    BOTTLES DRAWN AEROBIC AND ANAEROBIC Blood Culture adequate volume   Culture   Final    NO GROWTH 5 DAYS Performed at [facility], [address], [place], [name] [address]    Report Status 02/08/2024 FINAL  Final  MRSA Next Gen by PCR, Nasal     Status: None   Collection Time: 02/05/24  3:17 AM   Specimen: Nasal Mucosa; Nasal Swab  Result Value Ref Range Status   MRSA by PCR Next Gen NOT DETECTED NOT DETECTED Final    Comment: (NOTE) The GeneXpert MRSA Assay (FDA approved for NASAL specimens only), is one component of a comprehensive MRSA colonization  surveillance program. It is not intended to diagnose MRSA infection nor to guide or monitor treatment for MRSA infections. Test performance is not FDA approved in patients less than 18 years old. Performed at [facility], [address], [place], [name] [address]      Radiology Studies: No results found.   Nilda Fendt, MD, PhD [facility]  Between 7 am - 7 pm I am available, please contact me via Amion (for emergencies) or Securechat (non urgent  messages)  Between 7 pm - 7 am I am not available, please contact night coverage MD/APP via Amion

## 2024-02-11 NOTE — Progress Notes (Signed)
 Physical Therapy Treatment Patient Details Name: Christina Bennett MRN: [id number] DOB: Sep 04, 1940 Today's Date: 02/11/2024   History of Present Illness The pt is an 83 yo female presenting 6/23 with AMS. Initial work up with concern for UTI, pt given antibiotics and returned to baseline. Code stroke called 1AM 6/26, pt given TNK and transferred to ICU. 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 PMH includes: COPD, DM II, DVT, HTN, arthritis.    PT Comments  Pt in bed upon arrival and agreeable to PT/OT session. Pt progressed in today's session by being able to take steps with MaxAx2 and [facility]. Assist needed to weight shift with pt then able to shuffle LE's forwards/backwards. Tactile input provided to LLE to initiate step. Cues for upright posture and anterior pelvic tilt with pt tending to have forward flexed trunk. Continue to recommend <3hrs post acute rehab. Acute PT to follow.    If plan is discharge home, recommend the following: Two people to help with walking and/or transfers;Two people to help with bathing/dressing/bathroom;Assistance with cooking/housework;Direct supervision/assist for medications management;Direct supervision/assist for financial management;Assist for transportation;Help with stairs or ramp for entrance   Can travel by private vehicle     No  Equipment Recommendations  Wheelchair (measurements PT);Wheelchair cushion (measurements PT);Hoyer lift       Precautions / Restrictions Precautions Precautions: Fall Recall of Precautions/Restrictions: Impaired Precaution/Restrictions Comments: SBP <180 Restrictions Weight Bearing Restrictions Per Provider Order: No     Mobility  Bed Mobility Overal bed mobility: Needs Assistance Bed Mobility: Supine to Sit, Sit to Supine    Supine to sit: Mod assist, +2 for physical assistance    General bed mobility comments: mod A +2  for BLE management and raise trunk    Transfers Overall transfer level: Needs assistance Equipment used: 2 person hand held assist Transfers: Sit to/from Stand, Bed to chair/wheelchair/BSC Sit to Stand: Mod assist, +2 physical assistance Stand pivot transfers: Max assist, +2 safety/equipment, +2 physical assistance    General transfer comment: Mod A +2 to rise into standing. tactile cues for glute activation. MaxAx2 for ste-pivot with assist to weight shift with pt able to shuffle  B LE. Tactile input needed for L LE.    Modified Rankin (Stroke Patients Only) Modified Rankin (Stroke Patients Only) Pre-Morbid Rankin Score: No symptoms Modified Rankin: Severe disability     Balance Overall balance assessment: Needs assistance Sitting-balance support: Feet supported Sitting balance-Leahy Scale: Fair Sitting balance - Comments: min A to CGA sitting EOB, anterior sway.   Standing balance support: Bilateral upper extremity supported Standing balance-Leahy Scale: Poor Standing balance comment: reliant on therapists.       Communication Communication Communication: Impaired Factors Affecting Communication: Reduced clarity of speech  Cognition Arousal: Alert Behavior During Therapy: WFL for tasks assessed/performed   PT - Cognitive impairments: No apparent impairments    Following commands: Impaired Following commands impaired: Follows one step commands with increased time    Cueing Cueing Techniques: Verbal cues, Tactile cues  Exercises General Exercises - Lower Extremity Ankle Circles/Pumps: AAROM, Left, 5 reps, Supine Quad Sets: Strengthening, Left, 5 reps, Supine Heel Slides: AROM, Left, 5 reps, Supine Hip ABduction/ADduction: AAROM, Left, 5 reps, Supine    General Comments General comments (skin integrity, edema, etc.): SpO2 92% on 1L. Reported pain on bottom with RN notified.      Pertinent Vitals/Pain Pain Assessment Pain Assessment: Faces Faces Pain Scale:  Hurts little more Pain Location: bottom Pain Descriptors / Indicators: Aching, Discomfort Pain Intervention(s): Limited activity within patient's tolerance, Monitored during session, Repositioned     PT Goals (current goals can now be found in the care plan section) Acute Rehab PT Goals PT Goal Formulation: With patient Time For Goal Achievement: 02/20/24 Potential to Achieve Goals: Good Progress towards PT goals: Progressing toward goals    Frequency    Min 2X/week           Co-evaluation PT/OT/SLP Co-Evaluation/Treatment: Yes Reason for Co-Treatment: Complexity of the patient's impairments (multi-system involvement);For patient/therapist safety;To address functional/ADL transfers PT goals addressed during session: Mobility/safety with mobility;Balance;Strengthening/ROM OT goals addressed during session: ADL's and self-care;Strengthening/ROM      AM-PAC PT 6 Clicks Mobility   Outcome Measure  Help needed turning from your back to your side while in a flat bed without using bedrails?: A Lot Help needed moving from lying on your back to sitting on the side of a flat bed without using bedrails?: Total Help needed moving to and from a bed to a chair (including a wheelchair)?: Total Help needed standing up from a chair using your arms (e.g., wheelchair or bedside chair)?: Total Help needed to walk in hospital room?: Total Help needed climbing 3-5 steps with a railing? : Total 6 Click Score: 7    End of Session Equipment Utilized During Treatment: Gait belt;Oxygen Activity Tolerance: Patient tolerated treatment well Patient left: in chair;with call bell/phone within reach;with chair alarm set Nurse Communication: Mobility status;Need for lift equipment PT Visit Diagnosis: Unsteadiness on feet (R26.81);Muscle weakness (generalized) (M62.81);Hemiplegia and hemiparesis Hemiplegia - Right/Left: Left Hemiplegia - dominant/non-dominant: Non-dominant Hemiplegia - caused by:  Cerebral infarction     Time: 1111-1146 PT Time Calculation (min) (ACUTE ONLY): 35 min  Charges:    $Gait Training: 8-22 mins PT General Charges $$ ACUTE PT VISIT: 1 Visit                    Kate ORN, PT, DPT Secure Chat Preferred  Rehab Office [phone]   Kate BRAVO Wendolyn 02/11/2024, 12:59 PM

## 2024-02-11 NOTE — Progress Notes (Signed)
 Calorie Count Note  48 hour calorie count ordered.  Reviewed intake from 7/1. Pt ate only bites of her lunch and dinner, refused breakfast completely. RN reports that this AM she only had a few spoonfuls of juice. Discussed intake with MD, pt is nowhere close to meeting her nutrition needs and has been without adequate nutrition > than 1 week. PMT has been consulted. If aggressive measures are desired would recommend placement of cortrak tube until permanent access can be secured. MD to discuss with pt and family  Diet: DYS2, honey thick Supplements: Acupuncturist TID, ensure plus high protein  Estimated Nutritional Needs:  Kcal:  1500-1700 kcal/d Protein:  75-90g/d Fluid:  1.5-1.7L/d  7/1 Breakfast: 0% intake 7/1 Lunch:  a couple bites 7/1 Dinner: 31 kcal, 1g protein  Total intake: 31 kcal (<5% of minimum estimated needs)  1 protein (<5% of minimum estimated needs)  NUTRITION DIAGNOSIS:  Moderate Malnutrition related to poor appetite (in the context of acute illness) as evidenced by energy intake < 75% for > 7 days, mild muscle depletion, mild fat depletion.   GOAL:  Patient will meet greater than or equal to 90% of their needs  INTERVENTION:  Continue kcal count to determine if pt is meeting nutrition needs Continue current diet as ordered Encourage PO intake Carnation Breakfast Essentials TID, each packet mixed with 8 ounces of 2% milk provides 13 grams of protein and 260 calories. Ensure Plus High Protein po BID, each supplement provides 350 kcal and 20 grams of protein Will need to be mixed to correct consistency  Monitor PMT discussions If within GOC, recommend placement of cortrak for enteral feeds until PEG can be placed   Vernell Lukes, RD, LDN, CNSC Registered Dietitian II Please reach out via secure chat

## 2024-02-11 NOTE — Progress Notes (Signed)
 Orthopedic Tech Progress Note Patient Details:  Christina Bennett 06-28-1941 [id number]  Delivered HANGER'S RESTING HAND SPLINT to bedside   Patient ID: Christina Bennett, female   DOB: [birth year], 83 y.o.   MRN: [id number]  Christina Bennett 02/11/2024, 4:34 PM

## 2024-02-12 ENCOUNTER — Inpatient Hospital Stay (HOSPITAL_COMMUNITY)

## 2024-02-12 DIAGNOSIS — R1310 Dysphagia, unspecified: Secondary | ICD-10-CM | POA: Diagnosis not present

## 2024-02-12 DIAGNOSIS — I639 Cerebral infarction, unspecified: Secondary | ICD-10-CM | POA: Diagnosis not present

## 2024-02-12 DIAGNOSIS — Z515 Encounter for palliative care: Secondary | ICD-10-CM | POA: Diagnosis not present

## 2024-02-12 DIAGNOSIS — I5033 Acute on chronic diastolic (congestive) heart failure: Secondary | ICD-10-CM | POA: Diagnosis not present

## 2024-02-12 LAB — GLUCOSE, CAPILLARY
Glucose-Capillary: 132 mg/dL — ABNORMAL HIGH (ref 70–99)
Glucose-Capillary: 124 mg/dL — ABNORMAL HIGH (ref 70–99)
Glucose-Capillary: 161 mg/dL — ABNORMAL HIGH (ref 70–99)
Glucose-Capillary: 154 mg/dL — ABNORMAL HIGH (ref 70–99)

## 2024-02-12 LAB — PHOSPHORUS: Phosphorus: 2 mg/dL — ABNORMAL LOW (ref 2.5–4.6)

## 2024-02-12 LAB — MAGNESIUM: Magnesium: 2.1 mg/dL (ref 1.7–2.4)

## 2024-02-12 MED ORDER — FREE WATER
50.0000 mL | Status: DC
  Administered 2024-02-12 – 2024-02-17 (×30): 50 mL

## 2024-02-12 MED ORDER — SALINE SPRAY 0.65 % NA SOLN
1.0000 | NASAL | Status: DC | PRN
  Administered 2024-02-12: 1 via NASAL
  Filled 2024-02-12: qty 44

## 2024-02-12 MED ORDER — OSMOLITE 1.5 CAL PO LIQD
1000.0000 mL | ORAL | Status: DC
  Administered 2024-02-12 – 2024-02-17 (×5): 1000 mL
  Filled 2024-02-12: qty 1000

## 2024-02-12 MED ORDER — THIAMINE MONONITRATE 100 MG PO TABS
100.0000 mg | ORAL_TABLET | Freq: Every day | ORAL | Status: AC
  Administered 2024-02-12 – 2024-02-16 (×5): 100 mg
  Filled 2024-02-12 (×5): qty 1

## 2024-02-12 MED ORDER — PROSOURCE TF20 ENFIT COMPATIBL EN LIQD
60.0000 mL | Freq: Every day | ENTERAL | Status: DC
Start: 2024-02-12 — End: 2024-02-17
  Administered 2024-02-12 – 2024-02-17 (×6): 60 mL
  Filled 2024-02-12 (×6): qty 60

## 2024-02-12 NOTE — Consult Note (Addendum)
 WOC Nurse Consult Note: Reason for Consult: Moisture Associated Skin Damage to buttocks/sacrum/perineum  Wound type: Moisture Associated Skin Damage  ICD-10 CM Codes for Irritant Dermatitis  L24A2 - Due to fecal, urinary or dual incontinence  Pressure Injury POA: NA, not pressure  Measurement: widespread to sacrum/buttocks/perineum  Wound bed: erythema  with scattered partial thickness skin loss and peeling epithelium; question satellite lesions indicating possible fungal component  Drainage (amount, consistency, odor) see nursing flowsheet  Periwound: erythema  Dressing procedure/placement/frequency: Cleanse sacrum/buttocks/perineum with Vashe wound cleanser Soila [phone]) do not rinse and allow to air dry. Apply Gerhardt's Butt Cream 3 times a day and prn soiling.  Would leave silicone foams off area to avoid holding moisture.    POC discussed with bedside nurse. Appreciate C. Claudine, RN assistance with this consult. WOC team will not follow. Re-consult if further needs arise.   Thank you,    Powell Bar MSN, RN-BC, [employer]

## 2024-02-12 NOTE — Procedures (Signed)
 Cortrak  Tube Type:  Cortrak - 43 inches Tube Location:  Left nare Initial Placement:  Stomach Secured by: Bridle Technique Used to Measure Tube Placement:  Marking at nare/corner of mouth Cortrak Secured At:  65 cm   Cortrak Tube Team Note:  Consult received to place a Cortrak feeding tube.   No x-ray is required. RN may begin using tube.   If the tube becomes dislodged please keep the tube and contact the Cortrak team at [email or web address] for replacement.  If after hours and replacement cannot be delayed, place a NG tube and confirm placement with an abdominal x-ray.    Christina Freestone MS, RD, LDN If unable to be reached, please send secure chat to "RD inpatient" available from 8:00a-4:00p daily

## 2024-02-12 NOTE — Progress Notes (Signed)
 Occupational Therapy Treatment Patient Details Name: Christina Bennett MRN: [id number] DOB: 1941/04/13 Today's Date: 02/12/2024   History of present illness The pt is an 83 yo female presenting 6/23 with AMS. Initial work up with concern for UTI, pt given antibiotics and returned to baseline. Code stroke called 1AM 6/26, pt given TNK and transferred to ICU. 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 PMH includes: COPD, DM II, DVT, HTN, arthritis.   OT comments  Performed splint check, no signs of discomfort/irritation/skin breakdown. Pt tolerated L resting hand splint well. Pt c/o bottom pain, repositioned her with pillow underneath L hip to offload bottom.   OT NOTE  RN STAFF  Please check splint every 4 hours during shift ( remove splint ) to assess for: * pain * redness *swelling  If any symptoms above present remove splint for 15 minutes. If symptoms continue - keep the splint removed and notify OT staff [phone] immediately.   Keep the UE elevated at all times on pillows / towels.  Splint should have two splint covers (blue cloth) with a mesh bag for cleaning. The splint cover (blue cloth) should be washed with soapy water  and hung out to dry or washed on delicate in home washer. Do not throw away splint cover it is washable. Please have a set location to store the splints in patients room for daily application.    Splints are to be worn for 4 hours and off for 2 hours  Examples of schedule:  Splints off at 6am Splints on at 8 am Splints off at 12 pm Splints on at 2 pm Splints off at 6pm Splints on at 8 pm    To place the splint on:  1. Place the wrist in position first and secure strap 2. Position each digit and apply strap 3. The thumb and forearm strap should be applied last   The splints should fit as appeared here Strap over the PIP joint of finger Strap over the  MCP ( knuckles)  joints of the hand Strap at the thumb Strap at the wrist Strap at the forearm       If plan is discharge home, recommend the following:  Two people to help with walking and/or transfers;A lot of help with bathing/dressing/bathroom;Assistance with feeding;Assistance with Charity fundraiser cushion (measurements OT);Wheelchair (measurements OT)    Recommendations for Other Services      Precautions / Restrictions Precautions Precautions: Fall Recall of Precautions/Restrictions: Impaired Precaution/Restrictions Comments: SBP <180 Restrictions Weight Bearing Restrictions Per Provider Order: No           ADL either performed or assessed with clinical judgement   ADL           General ADL Comments: splint check     Communication Communication Communication: Impaired Factors Affecting Communication: Reduced clarity of speech   Cognition Arousal: Alert Behavior During Therapy: WFL for tasks assessed/performed                                 Following commands: Impaired Following commands impaired: Follows one step commands with increased time      Cueing   Cueing Techniques: Verbal cues, Tactile cues             Pertinent Vitals/ Pain       Pain Assessment  Pain Assessment: Faces Faces Pain Scale: Hurts little more Pain Location: bottom Pain Descriptors / Indicators: Aching, Discomfort Pain Intervention(s): Repositioned, Monitored during session   Frequency  Min 2X/week        Progress Toward Goals  OT Goals(current goals can now be found in the care plan section)  Progress towards OT goals: Progressing toward goals  Acute Rehab OT Goals Patient Stated Goal: Return home OT Goal Formulation: With patient Time For Goal Achievement: 02/20/24 Potential to Achieve Goals: Good ADL Goals Additional ADL Goal #1: Pt will demonstrate mod i with splint wear  Plan         AM-PAC OT  6 Clicks Daily Activity     Outcome Measure   Help from another person eating meals?: A Little Help from another person taking care of personal grooming?: A Little Help from another person toileting, which includes using toliet, bedpan, or urinal?: A Lot Help from another person bathing (including washing, rinsing, drying)?: A Lot Help from another person to put on and taking off regular upper body clothing?: A Lot Help from another person to put on and taking off regular lower body clothing?: Total 6 Click Score: 13    End of Session    OT Visit Diagnosis: Unsteadiness on feet (R26.81);Muscle weakness (generalized) (M62.81);Cognitive communication deficit (R41.841);Hemiplegia and hemiparesis Symptoms and signs involving cognitive functions: Cerebral infarction Hemiplegia - Right/Left: Left Hemiplegia - dominant/non-dominant: Non-Dominant Hemiplegia - caused by: Cerebral infarction   Activity Tolerance Patient tolerated treatment well   Patient Left in bed;with call bell/phone within reach;with bed alarm set   Nurse Communication Mobility status        Time: 1035-1050 OT Time Calculation (min): 15 min  Charges: OT General Charges $OT Visit: 1 Visit OT Treatments $Orthotics/Prosthetics Check: 8-22 mins  02/12/2024  AB, OTR/L  Acute Rehabilitation Services  Office: [phone]   Christina Bennett 02/12/2024, 1:02 PM

## 2024-02-12 NOTE — Progress Notes (Signed)
 Patient ID: ROIZY HAROLD, female   DOB: 04/24/1941, 83 y.o.   MRN: [id number]    Progress Note from the Palliative Medicine Team at [facility]   Patient Name: Christina Bennett        Date: 02/12/2024 DOB: 21-Oct-1940  Age: 83 y.o. MRN#: [id number] Attending Physician: Trixie Nilda HERO, MD Primary Care Physician: Ransom Other, MD Admit Date: 02/02/2024   Reason for Consultation/Follow-up   Establishing Goals of Care   HPI/ Brief Hospital Review 83 y.o. F with COPD not on home O2, obesity, CAD, CKD IIIa baseline 0.8-1.1 who presented with dizziness, blurry vision, HA, and decrased appetite. CXR in the ER showed bilateral opacities, so she was admitted for pneumonia and possible dCHF flare. After admission, she developed acute left sided weakness overnight, CODE STROKE called, given TNK and transferred to ICU.    Patient with the support of her family faces treatment option decisions, advanced directive decisions and anticipatory care needs.  Subjective  Extensive chart review has been completed prior to meeting with patient/family  including labs, vital signs, imaging, progress/consult notes, orders, medications and available advance directive documents.    This NP assessed patient at the bedside as a follow up for palliative medicine needs and emotional support and to further explore patient's decisions/wishes regarding artificial feeding and hydration.    Patient is alert and oriented and verbalizes an understanding that she is here in the hospital secondary to a stroke.  Education offered on the likely trajectory into the near future for Ms. Zettel as it relates to need for skilled nursing facility for rehabilitation and possibly long-term care. Education offered on the risks and benefits of artificial feeding as it relates to core track, PEG tube versus comfort feeds as tolerated.  Education regarding the difference between a full medical support path verses a comfort path  allowing for a natural death presented.  Patient clearly verbalizes her desire for PEG tube and rehabilitation I want to try   Spoke with niece/ [name] Devonshire.  Above concepts discussed/explored  with [name].  Barnie  verbalizes great concerns over patient's medical situation and future needs    Patient has limited social support and resources.  Barnie and her sister are really her only support and sister lives in [place] .  She expresses concern for patient's ability to fully grasp the implications of her condition.   Plan of Care: - DNR/DNI - Patient is accepting of core track today and placement of PEG tube next week as discussed with Dr. Trixie and myself. -open to all offered and available medical interventions to prolong life -PMT will continued to support holistically  Education offered today regarding  the importance of continued conversation with family and their  medical providers regarding overall plan of care and treatment options,  ensuring decisions are within the context of the patients values and GOCs.  Questions and concerns addressed   Discussed with primary team and nursing staff   Time: 50  minutes  Detailed review of medical records ( labs, imaging, vital signs), medically appropriate exam ( MS, skin, cardiac,  resp)   discussed with treatment team, counseling and education to patient, family, staff, documenting clinical information, medication management, coordination of care    Ronal Plants NP  Palliative Medicine Team Team Phone # [phone] Pager [phone]

## 2024-02-12 NOTE — Progress Notes (Signed)
 Calorie Count Note  48 hour calorie count ordered.  Pt continues to eat virtually nothing aside from a few spoonfuls of liquids. Pt awake at the time of visit, seems uncomfortable. Breakfast tray untouched aside from thickened water . Has not tried the carnation instant breakfast, will discontinue. PMT working with pt, if aggressive measures are desired recommend cortrak placement today as it will not be available again until 7/7.  Will discontinue kcal count   Vernell Lukes, RD, LDN, CNSC Registered Dietitian II Please reach out via secure chat

## 2024-02-12 NOTE — Progress Notes (Signed)
 Patient is off the unit for CT Scan

## 2024-02-12 NOTE — Progress Notes (Signed)
 Speech Language Pathology Treatment: Dysphagia  Patient Details Name: Christina Bennett MRN: [id number] DOB: 1940/10/13 Today's Date: 02/12/2024 Time: 8776-8766 SLP Time Calculation (min) (ACUTE ONLY): 10 min  Assessment / Plan / Recommendation Clinical Impression  Pt's diet was downgraded to Dys 1 (puree) on previous date. Per secure chat from RN, pt's niece had requested this due to pt not tolerating Dys 2 diet. Family not present at the bedside today and pt provides limited insight into what may have transpired. She still self-limits herself to spoonfuls of honey thick liquids, not willing to try cup sips today despite encouragement from SLP. She took very little bites of puree, not taking all of what was offered on the spoon at one time, reporting that it feels like it won't go down. This is not alleviated by liquid wash. Note that clinically there are no overt signs of aspiration across consistencies and other than prolonged oral phase, no other overt signs of dysphagia despite subjective reports from pt. Will leave on current diet based on request from niece, as relayed through nursing.    HPI HPI: Patient is an 83 y.o. female with PMH: CAD, COPD, essential HTN, CKD stage III, morbid obesity. She presented to the Ed on 02/03/24 with multiple complaints inclucding generalized weakeness, HA, poor appetite, chest congestion, blurry vision and family reporting change in her speech. ED evaluation found her to be hemodynamically stable but with BP up to 183/86, UA showed evidence of UTI. CT head negative for acute intracranial abnormality or hemorrhage but did show moderate periventricular WM changes likely reflecting the sequela of small vessel ischemia. CXR showed diffuse interstitial opacities, R>L. While in ED, patient became very restless, anxious and fidgety. Despite multiple doses of Ativan  to attempt MRI,, it was canceled due to patient not cooperating. Non-violent restraints placed. SLP swallow  evalauation ordered secondary to family reports of swallowing difficulities as well as RN observing patient to not be able to swallow even smaller pills, and coughing with liquids.  MBSS 6/25 with recs for D3/NTL. Pt with code stroke overnight and reassessed 6/26 with change in swallow function.      SLP Plan  Continue with current plan of care          Recommendations  Diet recommendations: Dysphagia 1 (puree);Honey-thick liquid Liquids provided via: Cup;Teaspoon;No straw Medication Administration: Crushed with puree Supervision: Staff to assist with self feeding;Full supervision/cueing for compensatory strategies Compensations: Slow rate;Small sips/bites;Minimize environmental distractions Postural Changes and/or Swallow Maneuvers: Seated upright 90 degrees;Upright 30-60 min after meal                  Oral care BID   Frequent or constant Supervision/Assistance Dysphagia, oropharyngeal phase (R13.12)     Continue with current plan of care     Leita SAILOR., M.A. CCC-SLP Acute Rehabilitation Services Office: [phone]  Secure chat preferred   02/12/2024, 1:41 PM

## 2024-02-12 NOTE — TOC Progression Note (Signed)
 Transition of Care [facility]) - Progression Note    Patient Details  Name: KRYSTIANNA SOTH MRN: [id number] Date of Birth: 12/20/1940  Transition of Care [facility] CM/SW Contact  [name], [name] Phone Number: 02/12/2024, 11:20 AM  Clinical Narrative:   CSW following for disposition. Patient not eating, goals of care ongoing with possible cortrak placement; not ready for SNF at this time. CSW to continue to follow.    Expected Discharge Plan: Skilled Nursing Facility Barriers to Discharge: Continued Medical Work up  Expected Discharge Plan and Services In-house Referral: Clinical Social Work     Living arrangements for the past 2 months: Single Family Home                                       Social Determinants of Health (SDOH) Interventions SDOH Screenings   Food Insecurity: Patient Unable To Answer (02/03/2024)  Housing: Unknown (02/03/2024)  Transportation Needs: Patient Unable To Answer (02/03/2024)  Utilities: Patient Unable To Answer (02/03/2024)  Social Connections: Patient Unable To Answer (02/03/2024)  Tobacco Use: Medium Risk (02/03/2024)    Readmission Risk Interventions     No data to display

## 2024-02-12 NOTE — Progress Notes (Signed)
 PROGRESS NOTE  Christina Bennett FMW:[id number] DOB: 10-07-1940 DOA: 02/02/2024 PCP: Ransom Other, MD   LOS: 9 days   Brief Narrative / Interim history: 83 y.o. F with COPD not on home O2, obesity, CAD, CKD IIIa baseline 0.8-1.1 who presented with dizziness, blurry vision, HA, and decrased appetite. CXR in the ER showed bilateral opacities, so she was admitted for pneumonia and possible dCHF flare. After admission, she developed acute left sided weakness overnight, CODE STROKE called, given TNK and transferred to ICU.  Subjective / 24h Interval events: She is alert this morning, when asked about feeding tube she answers okay.  I have explained to her that it is unlikely that the feeding tube will be placed up until next week and it would be good for her to get nutrition via core track, to which she said no initially, but willing to talk to the team who will place it  Assesement and Plan: Principal Problem:   Acute on chronic diastolic CHF (congestive heart failure) (HCC) Active Problems:   AKI (acute kidney injury) (HCC)   Dysphagia   Iron deficiency anemia   Non-insulin  dependent type 2 diabetes mellitus (HCC)   COPD (chronic obstructive pulmonary disease) (HCC)   Acute metabolic encephalopathy   History of CAD (coronary artery disease)   Chronic kidney disease (CKD), stage IIIa (moderate) (HCC)   Essential hypertension   CVA (cerebral vascular accident) (HCC)   Acute respiratory failure with hypoxia (HCC)   Morbid obesity (HCC)   Malnutrition of moderate degree   Principal problem Acute on chronic diastolic CHF -patient admitted to the hospital with fluid overload, received Lasix, this has been discontinued given elevation in the creatinine.  Creatinine stabilized, repeat in the morning   Active problems AKI on CKD IIIa  - Cr up to 1.8 on 7/1 from baseline 0.7-0.9, possibly in the setting of diuresis, losartan .  Received fluids, creatinine better today at 1.3.  She has very  little p.o. intake, hold off on starting torsemide for now   CVA (cerebral vascular accident) (HCC) - Overnight after admission, developed acute left sided weakness and dysarthria. MRI brain showed small pontine infarct. Post-stroke course complicated by dysphagia. Non-invasive angiography showed focal severe right P2 stenosis and moderate vertebral right stenosis. Echocardiogram showed no cardiogenic source of embolism. Carotid imaging unremarkable. Continue Lipitor, Plavix, she is allergic to aspirin.  PT recommends SNF  Dysphagia - Has a history of esophageal stricture.  Since her stroke, she has had marked oropharyngeal dysphagia.  I am uncertain she can actually maintain her oral intake needs by mouth in her current state (given the honey thick liquid restriction).  Calorie count started 7/1, strict in and outs - Very poor p.o. intake, she is agreeable to a PEG tube but unlikely to happen over the long weekend -SLP consulted and following    Acute respiratory failure with hypoxia (HCC) - Due to CHF, as above   Pneumonia ruled out - Admitted on antibiotics.  No fever, no leukocytosis.  Antibiotics stopped and patient without new fever or leukocytosis or sputum.  Doubt infection.   Headache- This was her initial presenting complaint.  CT and MRI of the head showed no acute findings, no hematoma, no dissection.  I am uncertain the etiology.  It appears to wax and wane and migrate (frontal, neck/occipital).   Essential hypertension - BP slightly high.  Was on losartan , now discontinued due to AKI.  Monitor blood pressure, 140 this morning   History of CAD (coronary  artery disease) - Aspirin allergic. Continue Lipitor, Plavix   Acute metabolic encephalopathy - At baseline has no cognitive impairment, lives independently.  Here she has had waxing and waning decreased mentation.  Likely due to stroke.    COPD (chronic obstructive pulmonary disease) (HCC) - No active flare, continue home inhaler, no  wheezing   Non-insulin  dependent type 2 diabetes mellitus (HCC) - Uncontrolled.  A1c 9.2%.  Continue sliding scale   Iron deficiency anemia - Hgb stable   Class 1 obesity - BMI 34, complicates care  Scheduled Meds:  artificial tears  1 drop Left Eye Daily   atorvastatin   40 mg Oral Daily   budesonide -glycopyrrolate -formoterol   2 puff Inhalation BID   Chlorhexidine  Gluconate Cloth  6 each Topical Daily   clopidogrel  75 mg Oral Daily   clotrimazole  1 Applicatorful Vaginal QHS   enoxaparin (LOVENOX) injection  40 mg Subcutaneous Q24H   feeding supplement  237 mL Oral BID BM   insulin  aspart  0-5 Units Subcutaneous QHS   insulin  aspart  0-9 Units Subcutaneous TID WC   montelukast   10 mg Oral QPM   pantoprazole   40 mg Oral Daily   polyethylene glycol  17 g Oral Daily   prednisoLONE acetate  1 drop Left Eye Q1200   senna-docusate  1 tablet Oral Daily   sodium chloride  flush  3 mL Intravenous Q12H   sodium chloride  flush  3-10 mL Intravenous Q12H   valACYclovir  1,000 mg Oral Daily   Continuous Infusions: PRN Meds:.acetaminophen , bisacodyl, calcium  carbonate, dextrose , diphenhydrAMINE , haloperidol  lactate, hydrALAZINE , HYDROcodone -acetaminophen , ipratropium-albuterol , labetalol , melatonin, ondansetron  **OR** ondansetron  (ZOFRAN ) IV, mouth rinse, polyethylene glycol, sodium chloride , sodium chloride  flush, sodium chloride  flush  Current Outpatient Medications  Medication Instructions   albuterol  (PROVENTIL  HFA;VENTOLIN  HFA) 108 (90 BASE) MCG/ACT inhaler 2 puffs, Every 4 hours PRN   BREZTRI  AEROSPHERE 160-9-4.8 MCG/ACT AERO 2 puffs, 2 times daily   fluticasone (FLONASE) 50 MCG/ACT nasal spray 2 sprays, Every evening   glimepiride (AMARYL) 2 mg, 2 times daily   HYDROcodone -acetaminophen  (NORCO) 7.5-325 MG tablet 1 tablet, 2 times daily PRN   losartan  (COZAAR ) 100 mg, Daily   montelukast  (SINGULAIR ) 10 mg, Every evening   pantoprazole  (PROTONIX ) 40 mg, Daily   polyvinyl alcohol  (LIQUIFILM TEARS) 1.4 % ophthalmic solution 1 drop, Daily   prednisoLONE acetate (PRED FORTE) 1 % ophthalmic suspension 1 drop, Daily   simvastatin  (ZOCOR ) 40 mg, Every evening   valACYclovir (VALTREX) 1,000 mg, Daily   Vitamin D (Ergocalciferol) (DRISDOL) 50,000 Units, Every 14 days    Diet Orders (From admission, onward)     Start     Ordered   02/11/24 1616  DIET - DYS 1 Room service appropriate? Yes; Fluid consistency: Honey Thick  Diet effective now       Question Answer Comment  Room service appropriate? Yes   Fluid consistency: Honey Thick      02/11/24 1615            DVT prophylaxis: enoxaparin (LOVENOX) injection 40 mg Start: 02/11/24 2200 SCD's Start: 02/05/24 0218 SCDs Start: 02/03/24 0132 Place TED hose Start: 02/03/24 0132   Lab Results  Component Value Date   PLT 185 02/11/2024      Code Status: Limited: Do not attempt resuscitation (DNR) -DNR-LIMITED -Do Not Intubate/DNI   Family Communication: Niece over the phone  Status is: Inpatient Remains inpatient appropriate because: severity of illness  Level of care: Med-Surg  Consultants:  Palliative Neurology   Objective: Vitals:  02/12/24 0738 02/12/24 0806 02/12/24 0828 02/12/24 0856  BP:   (!) 161/71 (!) 140/62  Pulse:  75 78 84  Resp: 15 16 18    Temp:   97.9 F (36.6 C)   TempSrc:   Oral   SpO2:  95% 96% 98%  Weight:      Height:        Intake/Output Summary (Last 24 hours) at 02/12/2024 1058 Last data filed at 02/12/2024 0000 Gross per 24 hour  Intake --  Output 800 ml  Net -800 ml   Wt Readings from Last 3 Encounters:  02/10/24 85.2 kg  09/19/22 89.6 kg  03/20/22 86.4 kg    Examination:  Constitutional: NAD Eyes: lids and conjunctivae normal, no scleral icterus ENMT: mmm Neck: normal, supple Respiratory: clear to auscultation bilaterally, no wheezing, no crackles. Normal respiratory effort.  Cardiovascular: Regular rate and rhythm, no murmurs / rubs / gallops. No LE  edema. Abdomen: soft, no distention, no tenderness. Bowel sounds positive.   Data Reviewed: I have independently reviewed following labs and imaging studies   CBC Recent Labs  Lab 02/06/24 0635 02/07/24 0536 02/08/24 0734 02/09/24 0452 02/10/24 0450 02/11/24 0452  WBC 11.6* 13.7* 12.6* 12.8* 12.5* 10.0  HGB 11.8* 12.0 11.5* 12.2 11.8* 11.3*  HCT 40.5 40.7 40.0 42.2 40.5 40.0  PLT 216 216 199 199 196 185  MCV 84.9 87.2 88.5 87.2 89.0 87.5  MCH 24.7* 25.7* 25.4* 25.2* 25.9* 24.7*  MCHC 29.1* 29.5* 28.8* 28.9* 29.1* 28.3*  RDW 16.9* 17.2* 17.2* 17.1* 17.2* 17.2*  LYMPHSABS 1.7 2.0 1.9  --   --   --   MONOABS 1.0 1.1* 0.9  --   --   --   EOSABS 0.2 0.5 0.4  --   --   --   BASOSABS 0.0 0.1 0.1  --   --   --    Recent Labs  Lab 02/06/24 0635 02/07/24 0536 02/08/24 0734 02/09/24 0452 02/10/24 0450 02/11/24 0452  NA 140 143 142 143 145 144  K 3.7 4.0 4.1 3.9 4.0 3.6  CL 104 109 110 105 107 112*  CO2 24 23 21* 27 31 23   GLUCOSE 137* 115* 111* 113* 124* 120*  BUN 19 23 25* 22 36* 32*  CREATININE 0.88 0.88 0.94 0.88 1.88* 1.31*  CALCIUM  8.8* 8.6* 8.4* 9.0 8.9 8.1*  AST  --   --   --  25 27  --   ALT  --   --   --  13 11  --   ALKPHOS  --   --   --  75 70  --   BILITOT  --   --   --  0.9 0.6  --   ALBUMIN  --   --   --  3.0* 2.9*  --   MG 2.2 2.3 2.3  --   --   --     ------------------------------------------------------------------------------------------------------------------ No results for input(s): CHOL, HDL, LDLCALC, TRIG, CHOLHDL, LDLDIRECT in the last 72 hours.  Lab Results  Component Value Date   HGBA1C 9.2 (H) 02/03/2024   ------------------------------------------------------------------------------------------------------------------ No results for input(s): TSH, T4TOTAL, T3FREE, THYROIDAB in the last 72 hours.  Invalid input(s): FREET3  Cardiac Enzymes No results for input(s): CKMB, TROPONINI, MYOGLOBIN in the last 168  hours.  Invalid input(s): CK ------------------------------------------------------------------------------------------------------------------    Component Value Date/Time   BNP 43.6 02/04/2024 0312   CBG: Recent Labs  Lab 02/11/24 0624 02/11/24 1156 02/11/24 1601 02/11/24 2143 02/12/24 9379  GLUCAP 127* 120* 152* 123* 132*    Recent Results (from the past 240 hours)  Urine Culture     Status: Abnormal   Collection Time: 02/02/24  9:11 PM   Specimen: Urine, Random  Result Value Ref Range Status   Specimen Description URINE, RANDOM  Final   Special Requests   Final    NONE Reflexed from [id number] Performed at [facility], [address], [place], [name] [address]    Culture MULTIPLE SPECIES PRESENT, SUGGEST RECOLLECTION (A)  Final   Report Status 02/03/2024 FINAL  Final  Respiratory (~20 pathogens) panel by PCR     Status: None   Collection Time: 02/03/24  1:37 AM   Specimen: Nasopharyngeal Swab; Respiratory  Result Value Ref Range Status   Adenovirus NOT DETECTED NOT DETECTED Final   Coronavirus 229E NOT DETECTED NOT DETECTED Final    Comment: (NOTE) The Coronavirus on the Respiratory Panel, DOES NOT test for the novel  Coronavirus (2019 nCoV)    Coronavirus HKU1 NOT DETECTED NOT DETECTED Final   Coronavirus NL63 NOT DETECTED NOT DETECTED Final   Coronavirus OC43 NOT DETECTED NOT DETECTED Final   Metapneumovirus NOT DETECTED NOT DETECTED Final   Rhinovirus / Enterovirus NOT DETECTED NOT DETECTED Final   Influenza A NOT DETECTED NOT DETECTED Final   Influenza B NOT DETECTED NOT DETECTED Final   Parainfluenza Virus 1 NOT DETECTED NOT DETECTED Final   Parainfluenza Virus 2 NOT DETECTED NOT DETECTED Final   Parainfluenza Virus 3 NOT DETECTED NOT DETECTED Final   Parainfluenza Virus 4 NOT DETECTED NOT DETECTED Final   Respiratory Syncytial Virus NOT DETECTED NOT DETECTED Final   Bordetella pertussis NOT DETECTED NOT DETECTED Final   Bordetella Parapertussis  NOT DETECTED NOT DETECTED Final   Chlamydophila pneumoniae NOT DETECTED NOT DETECTED Final   Mycoplasma pneumoniae NOT DETECTED NOT DETECTED Final    Comment: Performed at [facility], [address] [place], [name] [address]  Culture, blood (routine x 2) Call MD if unable to obtain prior to antibiotics being given     Status: None   Collection Time: 02/03/24  4:10 AM   Specimen: BLOOD  Result Value Ref Range Status   Specimen Description BLOOD SITE NOT SPECIFIED  Final   Special Requests   Final    BOTTLES DRAWN AEROBIC AND ANAEROBIC Blood Culture adequate volume   Culture   Final    NO GROWTH 5 DAYS Performed at [facility], [address], [place], [name] [address]    Report Status 02/08/2024 FINAL  Final  Culture, blood (routine x 2) Call MD if unable to obtain prior to antibiotics being given     Status: None   Collection Time: 02/03/24  4:18 AM   Specimen: BLOOD  Result Value Ref Range Status   Specimen Description BLOOD SITE NOT SPECIFIED  Final   Special Requests   Final    BOTTLES DRAWN AEROBIC AND ANAEROBIC Blood Culture adequate volume   Culture   Final    NO GROWTH 5 DAYS Performed at [facility], [address] [place], [name] [address]    Report Status 02/08/2024 FINAL  Final  MRSA Next Gen by PCR, Nasal     Status: None   Collection Time: 02/05/24  3:17 AM   Specimen: Nasal Mucosa; Nasal Swab  Result Value Ref Range Status   MRSA by PCR Next Gen NOT DETECTED NOT DETECTED Final    Comment: (NOTE) The  GeneXpert MRSA Assay (FDA approved for NASAL specimens only), is one component of a comprehensive MRSA colonization surveillance program. It is not intended to diagnose MRSA infection nor to guide or monitor treatment for MRSA infections. Test performance is not FDA approved in patients less than 64 years old. Performed at [facility], [address], [place], [name] [address]      Radiology Studies: DG Chest 2 View Result  Date: 02/11/2024 CLINICAL DATA:  Hypoxemia. EXAM: CHEST - 2 VIEW COMPARISON:  Radiograph 02/08/2024 FINDINGS: The heart is borderline enlarged. Mediastinal contours are stable. Improvement in interstitial opacities from prior exam. No developing airspace disease. Minor subsegmental atelectasis at the lung bases. No pleural effusion. No pneumothorax. IMPRESSION: Improvement in interstitial opacities from prior exam. No developing airspace disease. Electronically Signed   By: Andrea Gasman M.D.   On: 02/11/2024 14:43   Nilda Fendt, MD, PhD [facility]  Between 7 am - 7 pm I am available, please contact me via Amion (for emergencies) or Securechat (non urgent messages)  Between 7 pm - 7 am I am not available, please contact night coverage MD/APP via Amion

## 2024-02-12 NOTE — Plan of Care (Signed)
  Problem: Education: Goal: Ability to describe self-care measures that may prevent or decrease complications (Diabetes Survival Skills Education) will improve Outcome: Progressing   Problem: Education: Goal: Knowledge of General Education information will improve Description: Including pain rating scale, medication(s)/side effects and non-pharmacologic comfort measures Outcome: Progressing   Problem: Clinical Measurements: Goal: Respiratory complications will improve Outcome: Progressing Goal: Cardiovascular complication will be avoided Outcome: Progressing   Problem: Nutrition: Goal: Adequate nutrition will be maintained Outcome: Progressing   Problem: Coping: Goal: Level of anxiety will decrease Outcome: Progressing   Problem: Elimination: Goal: Will not experience complications related to urinary retention Outcome: Progressing   Problem: Skin Integrity: Goal: Risk for impaired skin integrity will decrease Outcome: Progressing

## 2024-02-12 NOTE — Progress Notes (Addendum)
 Nutrition Follow-up  DOCUMENTATION CODES:  Non-severe (moderate) malnutrition in context of acute illness/injury  INTERVENTION:  Continue current diet as ordered Encourage PO intake Ensure Plus High Protein po BID, each supplement provides 350 kcal and 20 grams of protein Will need to be mixed to correct consistency  Initiate tube feeding via cortrak: Osmolite 1.5 at 45 ml/h (1080 ml per day) Start at 25 and advance by 10mL q12h Flush with 50mL of free water  q4h) Prosource TF20 60 ml 1x/d Provides 1700 kcal, 88 gm protein, 823 ml free water  daily (TF+flush = 1262mL/d) Pt is at risk for refeeding syndrome given malnutrition and prolonged poor PO intake. Monitor magnesium and phosphorus daily x 3 days, MD to replete as needed. 100mg  thiamine x 5 days  NUTRITION DIAGNOSIS:  Moderate Malnutrition related to poor appetite (in the context of acute illness) as evidenced by energy intake < 75% for > 7 days, mild muscle depletion, mild fat depletion. - progressing  GOAL:  Patient will meet greater than or equal to 90% of their needs - progressing  MONITOR:  PO intake, Supplement acceptance, Labs, Weight trends  REASON FOR ASSESSMENT:  Consult Enteral/tube feeding initiation and management  ASSESSMENT:  Pt with hx of CAD, COPD, HTN, HLD, CKD3, and DM type 2 presented to ED with AMS, poor appetite, and weakness.   6/23 - presented to ED 6/25 - MBS, DYS3, nectar liquids 6/26 - code stroke called, received TNK, CT negative, MRI showed R pontine infarct, repeat MBS: SLP recommended DYS2/honey thick 7/3 - cortrak placed  Pt resting in bed at the time of assessment, uncomfortable and wanting to be readjusted. Concluded kcal count today and discussed with MD. Pt has agreed to PEG placement but due to holiday weekend, will likely not be able to have done until next week sometime. Cortrak ordered and able to be placed. Will initiate TF slowly to monitor for refeeding syndrome and  tolerance.  Admit weight: 89.6 kg ? Accuracy, copied from last encounter in 2024 Current weight: 85.2 kg    Average Meal Intake: 6/25-6/30: 22% intake x 6 recorded meals  Nutritionally Relevant Medications: Scheduled Meds:  atorvastatin   40 mg Oral Daily   Ensure Plus High Protein  237 mL Oral BID BM   insulin  aspart  0-5 Units Subcutaneous QHS   insulin  aspart  0-9 Units Subcutaneous TID WC   montelukast   10 mg Oral QPM   pantoprazole   40 mg Oral Daily   polyethylene glycol  17 g Oral Daily   senna-docusate  1 tablet Oral Daily   PRN Meds: bisacodyl, calcium  carbonate, dextrose , diphenhydrAMINE , ondansetron , polyethylene glycol  Labs Reviewed: BUN 36, creatinine 1.88 CBG ranges from 100-141 mg/dL over the last 24 hours HgbA1c 9.2%  NUTRITION - FOCUSED PHYSICAL EXAM: Flowsheet Row Most Recent Value  Orbital Region Mild depletion  Upper Arm Region No depletion  Thoracic and Lumbar Region No depletion  Buccal Region Mild depletion  Temple Region No depletion  Clavicle Bone Region No depletion  Clavicle and Acromion Bone Region Mild depletion  Scapular Bone Region No depletion  Dorsal Hand Mild depletion  Patellar Region Mild depletion  Anterior Thigh Region Mild depletion  Posterior Calf Region Mild depletion  Edema (RD Assessment) Mild  Hair Reviewed  Eyes Reviewed  Mouth Reviewed  Skin Reviewed  Nails Reviewed    Diet Order:   Diet Order             DIET - DYS 1 Room service appropriate? Yes; Fluid  consistency: Honey Thick  Diet effective now                   EDUCATION NEEDS:  Education needs have been addressed  Skin:  Skin Assessment: Reviewed RN Assessment  Last BM:  6/30  Height:  Ht Readings from Last 1 Encounters:  02/03/24 5' 2 (1.575 m)    Weight:  Wt Readings from Last 1 Encounters:  02/10/24 85.2 kg    Ideal Body Weight:  50 kg  BMI:  Body mass index is 34.35 kg/m.  Estimated Nutritional Needs:  Kcal:  1500-1700  kcal/d Protein:  75-90g/d Fluid:  1.5-1.7L/d    Vernell Lukes, RD, LDN, CNSC Registered Dietitian II Please reach out via secure chat

## 2024-02-12 NOTE — Progress Notes (Signed)
 Patient is back to the unit.

## 2024-02-13 ENCOUNTER — Inpatient Hospital Stay (HOSPITAL_COMMUNITY)

## 2024-02-13 DIAGNOSIS — I5033 Acute on chronic diastolic (congestive) heart failure: Secondary | ICD-10-CM | POA: Diagnosis not present

## 2024-02-13 LAB — GLUCOSE, CAPILLARY
Glucose-Capillary: 183 mg/dL — ABNORMAL HIGH (ref 70–99)
Glucose-Capillary: 112 mg/dL — ABNORMAL HIGH (ref 70–99)
Glucose-Capillary: 202 mg/dL — ABNORMAL HIGH (ref 70–99)
Glucose-Capillary: 210 mg/dL — ABNORMAL HIGH (ref 70–99)

## 2024-02-13 LAB — BASIC METABOLIC PANEL WITH GFR
Anion gap: 6 (ref 5–15)
BUN: 31 mg/dL — ABNORMAL HIGH (ref 8–23)
CO2: 27 mmol/L (ref 22–32)
Calcium: 8.9 mg/dL (ref 8.9–10.3)
Chloride: 115 mmol/L — ABNORMAL HIGH (ref 98–111)
Creatinine, Ser: 0.71 mg/dL (ref 0.44–1.00)
GFR, Estimated: >60 mL/min (ref >60)
Glucose, Bld: 188 mg/dL — ABNORMAL HIGH (ref 70–99)
Potassium: 3.5 mmol/L (ref 3.5–5.1)
Sodium: 148 mmol/L — ABNORMAL HIGH (ref 135–145)

## 2024-02-13 LAB — TROPONIN I (HIGH SENSITIVITY)
Troponin I (High Sensitivity): 8 ng/L (ref <18)
Troponin I (High Sensitivity): 8 ng/L (ref <18)

## 2024-02-13 LAB — PHOSPHORUS: Phosphorus: 2.8 mg/dL (ref 2.5–4.6)

## 2024-02-13 LAB — MAGNESIUM: Magnesium: 2.1 mg/dL (ref 1.7–2.4)

## 2024-02-13 MED ORDER — GERHARDT'S BUTT CREAM
TOPICAL_CREAM | Freq: Three times a day (TID) | CUTANEOUS | Status: DC
  Administered 2024-02-14 – 2024-02-19 (×4): 1 via TOPICAL
  Filled 2024-02-13 (×2): qty 60

## 2024-02-13 MED ORDER — TORSEMIDE 20 MG PO TABS
20.0000 mg | ORAL_TABLET | Freq: Every day | ORAL | Status: DC
  Administered 2024-02-14: 20 mg via ORAL
  Filled 2024-02-13: qty 1

## 2024-02-13 MED ORDER — PHENOL 1.4 % MT LIQD
1.0000 | OROMUCOSAL | Status: DC | PRN
  Administered 2024-02-13 – 2024-02-14 (×2): 1 via OROMUCOSAL
  Filled 2024-02-13: qty 177

## 2024-02-13 MED ORDER — LOSARTAN POTASSIUM 50 MG PO TABS
50.0000 mg | ORAL_TABLET | Freq: Every day | ORAL | Status: DC
  Administered 2024-02-13 – 2024-02-25 (×13): 50 mg via ORAL
  Filled 2024-02-13 (×13): qty 1

## 2024-02-13 NOTE — Progress Notes (Signed)
 PROGRESS NOTE  Christina Bennett FMW:[id number] DOB: 1941/02/12 DOA: 02/02/2024 PCP: Ransom Other, MD   LOS: 10 days   Brief Narrative / Interim history: 83 y.o. F with COPD not on home O2, obesity, CAD, CKD IIIa baseline 0.8-1.1 who presented with dizziness, blurry vision, HA, and decrased appetite. CXR in the ER showed bilateral opacities, so she was admitted for pneumonia and possible dCHF flare. After admission, she developed acute left sided weakness overnight, CODE STROKE called, given TNK and transferred to ICU.  Subjective / 24h Interval events: She complains of a sore throat, feeling tube is bothering her  Assesement and Plan: Principal Problem:   Acute on chronic diastolic CHF (congestive heart failure) (HCC) Active Problems:   AKI (acute kidney injury) (HCC)   Dysphagia   Iron deficiency anemia   Non-insulin  dependent type 2 diabetes mellitus (HCC)   COPD (chronic obstructive pulmonary disease) (HCC)   Acute metabolic encephalopathy   History of CAD (coronary artery disease)   Chronic kidney disease (CKD), stage IIIa (moderate) (HCC)   Essential hypertension   CVA (cerebral vascular accident) (HCC)   Acute respiratory failure with hypoxia (HCC)   Morbid obesity (HCC)   Malnutrition of moderate degree   Principal problem Acute on chronic diastolic CHF -patient admitted to the hospital with fluid overload, received Lasix , this has been discontinued given elevation in the creatinine.  Creatinine is now stabilized, is back to normal at 0.7, will start low-dose torsemide  tomorrow.  She is slightly hypernatremic   Active problems AKI on CKD IIIa  - Cr up to 1.8 on 7/1 from baseline 0.7-0.9, possibly in the setting of diuresis, losartan .  Received fluids, creatinine is now normal   CVA (cerebral vascular accident) (HCC) - Overnight after admission, developed acute left sided weakness and dysarthria. MRI brain showed small pontine infarct. Post-stroke course complicated by  dysphagia. Non-invasive angiography showed focal severe right P2 stenosis and moderate vertebral right stenosis. Echocardiogram showed no cardiogenic source of embolism. Carotid imaging unremarkable. Continue Lipitor, Plavix , she is allergic to aspirin.  PT recommends SNF  Dysphagia - Has a history of esophageal stricture.  Since her stroke, she has had marked oropharyngeal dysphagia. She is agreeable to a PEG tube but unlikely to happen over the long weekend -SLP consulted and following.  Underwent a core track placement 7/3, now on tube feeds    Acute respiratory failure with hypoxia (HCC) - Due to CHF, as above   Pneumonia ruled out - Admitted on antibiotics.  No fever, no leukocytosis.  Antibiotics stopped and patient without new fever or leukocytosis or sputum.  Doubt infection.   Headache- This was her initial presenting complaint.  CT and MRI of the head showed no acute findings, no hematoma, no dissection.  I am uncertain the etiology.  It appears to wax and wane and migrate (frontal, neck/occipital).   Essential hypertension - BP slightly high.  Resume losartan  since creatinine now normalized   History of CAD (coronary artery disease) - Aspirin allergic. Continue Lipitor, Plavix    Acute metabolic encephalopathy - At baseline has no cognitive impairment, lives independently.  Here she has had waxing and waning decreased mentation.  Likely due to stroke.    COPD (chronic obstructive pulmonary disease) (HCC) - No active flare, continue home inhaler, no wheezing   Non-insulin  dependent type 2 diabetes mellitus (HCC) - Uncontrolled.  A1c 9.2%.  Continue sliding scale   Iron deficiency anemia - Hgb stable   Class 1 obesity - BMI 34, complicates  care  Scheduled Meds:  artificial tears  1 drop Left Eye Daily   atorvastatin   40 mg Oral Daily   budesonide -glycopyrrolate -formoterol   2 puff Inhalation BID   Chlorhexidine  Gluconate Cloth  6 each Topical Daily   clopidogrel   75 mg Oral Daily    clotrimazole   1 Applicatorful Vaginal QHS   enoxaparin  (LOVENOX ) injection  40 mg Subcutaneous Q24H   feeding supplement  237 mL Oral BID BM   feeding supplement (PROSource TF20)  60 mL Per Tube Daily   free water   50 mL Per Tube Q4H   Gerhardt's butt cream   Topical TID   insulin  aspart  0-5 Units Subcutaneous QHS   insulin  aspart  0-9 Units Subcutaneous TID WC   montelukast   10 mg Oral QPM   pantoprazole   40 mg Oral Daily   polyethylene glycol  17 g Oral Daily   prednisoLONE  acetate  1 drop Left Eye Q1200   senna-docusate  1 tablet Oral Daily   sodium chloride  flush  3 mL Intravenous Q12H   sodium chloride  flush  3-10 mL Intravenous Q12H   thiamine   100 mg Per Tube Daily   valACYclovir   1,000 mg Oral Daily   Continuous Infusions:  feeding supplement (OSMOLITE 1.5 CAL) 1,000 mL (02/13/24 0047)   PRN Meds:.acetaminophen , bisacodyl , calcium  carbonate, dextrose , diphenhydrAMINE , haloperidol  lactate, hydrALAZINE , HYDROcodone -acetaminophen , ipratropium-albuterol , labetalol , melatonin, ondansetron  **OR** ondansetron  (ZOFRAN ) IV, mouth rinse, polyethylene glycol, sodium chloride , sodium chloride  flush, sodium chloride  flush  Current Outpatient Medications  Medication Instructions   albuterol  (PROVENTIL  HFA;VENTOLIN  HFA) 108 (90 BASE) MCG/ACT inhaler 2 puffs, Every 4 hours PRN   BREZTRI  AEROSPHERE 160-9-4.8 MCG/ACT AERO 2 puffs, 2 times daily   fluticasone (FLONASE) 50 MCG/ACT nasal spray 2 sprays, Every evening   glimepiride (AMARYL) 2 mg, 2 times daily   HYDROcodone -acetaminophen  (NORCO) 7.5-325 MG tablet 1 tablet, 2 times daily PRN   losartan  (COZAAR ) 100 mg, Daily   montelukast  (SINGULAIR ) 10 mg, Every evening   pantoprazole  (PROTONIX ) 40 mg, Daily   polyvinyl alcohol  (LIQUIFILM TEARS) 1.4 % ophthalmic solution 1 drop, Daily   prednisoLONE  acetate (PRED FORTE ) 1 % ophthalmic suspension 1 drop, Daily   simvastatin  (ZOCOR ) 40 mg, Every evening   valACYclovir  (VALTREX ) 1,000 mg,  Daily   Vitamin D (Ergocalciferol) (DRISDOL) 50,000 Units, Every 14 days    Diet Orders (From admission, onward)     Start     Ordered   02/11/24 1616  DIET - DYS 1 Room service appropriate? Yes; Fluid consistency: Honey Thick  Diet effective now       Question Answer Comment  Room service appropriate? Yes   Fluid consistency: Honey Thick      02/11/24 1615            DVT prophylaxis: enoxaparin  (LOVENOX ) injection 40 mg Start: 02/11/24 2200 SCD's Start: 02/05/24 0218 SCDs Start: 02/03/24 0132 Place TED hose Start: 02/03/24 0132   Lab Results  Component Value Date   PLT 185 02/11/2024      Code Status: Limited: Do not attempt resuscitation (DNR) -DNR-LIMITED -Do Not Intubate/DNI   Family Communication: Niece over the phone  Status is: Inpatient Remains inpatient appropriate because: severity of illness  Level of care: Med-Surg  Consultants:  Palliative Neurology   Objective: Vitals:   02/13/24 0309 02/13/24 0500 02/13/24 0620 02/13/24 0744  BP: (!) 151/55  (!) 151/55 (!) 161/64  Pulse: 72   (!) 19  Resp: 16   15  Temp: 97.6 F (36.4 C)  97.8 F (36.6 C)  TempSrc: Axillary   Oral  SpO2: 97%   96%  Weight:  85.6 kg    Height:        Intake/Output Summary (Last 24 hours) at 02/13/2024 1020 Last data filed at 02/13/2024 0310 Gross per 24 hour  Intake 58.75 ml  Output 650 ml  Net -591.25 ml   Wt Readings from Last 3 Encounters:  02/13/24 85.6 kg  09/19/22 89.6 kg  03/20/22 86.4 kg    Examination:  Constitutional: NAD Eyes: lids and conjunctivae normal, no scleral icterus ENMT: mmm Neck: normal, supple Respiratory: clear to auscultation bilaterally, no wheezing, no crackles. Normal respiratory effort.  Cardiovascular: Regular rate and rhythm, no murmurs / rubs / gallops. No LE edema. Abdomen: soft, no distention, no tenderness. Bowel sounds positive.   Data Reviewed: I have independently reviewed following labs and imaging studies    CBC Recent Labs  Lab 02/07/24 0536 02/08/24 0734 02/09/24 0452 02/10/24 0450 02/11/24 0452  WBC 13.7* 12.6* 12.8* 12.5* 10.0  HGB 12.0 11.5* 12.2 11.8* 11.3*  HCT 40.7 40.0 42.2 40.5 40.0  PLT 216 199 199 196 185  MCV 87.2 88.5 87.2 89.0 87.5  MCH 25.7* 25.4* 25.2* 25.9* 24.7*  MCHC 29.5* 28.8* 28.9* 29.1* 28.3*  RDW 17.2* 17.2* 17.1* 17.2* 17.2*  LYMPHSABS 2.0 1.9  --   --   --   MONOABS 1.1* 0.9  --   --   --   EOSABS 0.5 0.4  --   --   --   BASOSABS 0.1 0.1  --   --   --    Recent Labs  Lab 02/07/24 0536 02/08/24 0734 02/09/24 0452 02/10/24 0450 02/11/24 0452 02/12/24 1243 02/13/24 0340  NA 143 142 143 145 144  --  148*  K 4.0 4.1 3.9 4.0 3.6  --  3.5  CL 109 110 105 107 112*  --  115*  CO2 23 21* 27 31 23   --  27  GLUCOSE 115* 111* 113* 124* 120*  --  188*  BUN 23 25* 22 36* 32*  --  31*  CREATININE 0.88 0.94 0.88 1.88* 1.31*  --  0.71  CALCIUM  8.6* 8.4* 9.0 8.9 8.1*  --  8.9  AST  --   --  25 27  --   --   --   ALT  --   --  13 11  --   --   --   ALKPHOS  --   --  75 70  --   --   --   BILITOT  --   --  0.9 0.6  --   --   --   ALBUMIN  --   --  3.0* 2.9*  --   --   --   MG 2.3 2.3  --   --   --  2.1 2.1    ------------------------------------------------------------------------------------------------------------------ No results for input(s): CHOL, HDL, LDLCALC, TRIG, CHOLHDL, LDLDIRECT in the last 72 hours.  Lab Results  Component Value Date   HGBA1C 9.2 (H) 02/03/2024   ------------------------------------------------------------------------------------------------------------------ No results for input(s): TSH, T4TOTAL, T3FREE, THYROIDAB in the last 72 hours.  Invalid input(s): FREET3  Cardiac Enzymes No results for input(s): CKMB, TROPONINI, MYOGLOBIN in the last 168 hours.  Invalid input(s): CK ------------------------------------------------------------------------------------------------------------------     Component Value Date/Time   BNP 43.6 02/04/2024 0312   CBG: Recent Labs  Lab 02/12/24 0620 02/12/24 1205 02/12/24 1619 02/12/24 2122 02/13/24 0605  GLUCAP 132*  124* 161* 154* 183*    Recent Results (from the past 240 hours)  MRSA Next Gen by PCR, Nasal     Status: None   Collection Time: 02/05/24  3:17 AM   Specimen: Nasal Mucosa; Nasal Swab  Result Value Ref Range Status   MRSA by PCR Next Gen NOT DETECTED NOT DETECTED Final    Comment: (NOTE) The GeneXpert MRSA Assay (FDA approved for NASAL specimens only), is one component of a comprehensive MRSA colonization surveillance program. It is not intended to diagnose MRSA infection nor to guide or monitor treatment for MRSA infections. Test performance is not FDA approved in patients less than 28 years old. Performed at [facility], [address] [place], [name] [address]      Radiology Studies: CT ABDOMEN WO CONTRAST Result Date: 02/12/2024 CLINICAL DATA:  Gastrostomy status EXAM: CT ABDOMEN WITHOUT CONTRAST TECHNIQUE: Multidetector CT imaging of the abdomen was performed following the standard protocol without IV contrast. RADIATION DOSE REDUCTION: This exam was performed according to the departmental dose-optimization program which includes automated exposure control, adjustment of the mA and/or kV according to patient size and/or use of iterative reconstruction technique. COMPARISON:  CT 05/07/2016 FINDINGS: Lower chest: Bibasilar atelectasis.  No acute abnormality. Hepatobiliary: No acute abnormality. Pancreas: Unremarkable. Spleen: Unremarkable. Adrenals/Urinary Tract: Left adrenal nodule is unchanged since 2017 compatible with benign adenoma. No follow-up recommended. Normal right adrenal gland. Right nonobstructing nephrolithiasis versus vascular calcifications. No hydronephrosis. Stomach/Bowel: Enteric tube tip in the gastric antrum. The visualized small bowel and colon are unremarkable. No bowel interposed between  the greater curvature of the stomach and the anterior abdominal wall. Vascular/Lymphatic: Aortic atherosclerotic calcification. No lymphadenopathy. Other: No free intraperitoneal fluid or air is visualized. Musculoskeletal: No acute fracture. IMPRESSION: No acute abnormality. No bowel interposed between the greater curvature of the stomach and the anterior abdominal wall. Feeding tube tip in the stomach. Electronically Signed   By: Norman Gatlin M.D.   On: 02/12/2024 21:34   Nilda Fendt, MD, PhD [facility]  Between 7 am - 7 pm I am available, please contact me via Amion (for emergencies) or Securechat (non urgent messages)  Between 7 pm - 7 am I am not available, please contact night coverage MD/APP via Amion

## 2024-02-13 NOTE — Progress Notes (Signed)
 Physical Therapy Treatment Patient Details Name: Christina Bennett MRN: [id number] DOB: [birth year] Today's Date: 02/13/2024   History of Present Illness The pt is an 83 yo female presenting 6/23 with AMS. Initial work up with concern for UTI, pt given antibiotics and returned to baseline. Code stroke called 1AM 6/26, pt given TNK and transferred to ICU. 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 PMH includes: COPD, DM II, DVT, HTN, arthritis.    PT Comments  Pt received in supine and agreeable to session. Pt requires increased assist due to L weakness, fatigue, and impaired balance. Pt requires mod-max A +2 for bed mobility and transfers with pt demonstrating difficulty reaching an upright stance. Pt requires 2 pivots to reach the middle of the recliner and total A for positioning. Pt continues to benefit from PT services to progress toward functional mobility goals.     If plan is discharge home, recommend the following: Two people to help with walking and/or transfers;Two people to help with bathing/dressing/bathroom;Assistance with cooking/housework;Direct supervision/assist for medications management;Direct supervision/assist for financial management;Assist for transportation;Help with stairs or ramp for entrance   Can travel by private vehicle     No  Equipment Recommendations  Wheelchair (measurements PT);Wheelchair cushion (measurements PT);Hoyer lift    Recommendations for [employer]       Precautions / Restrictions Precautions Precautions: Fall Recall of Precautions/Restrictions: Impaired Precaution/Restrictions Comments: SBP <180 Restrictions Weight Bearing Restrictions Per Provider Order: No     Mobility  Bed Mobility Overal bed mobility: Needs Assistance Bed Mobility: Supine to Sit     Supine to sit: Mod assist     General bed mobility comments: mod A for BLE  advancement to EOB, rolling to grab hand rail, and trunk elevation. Dense cues for step by step sequencing    Transfers Overall transfer level: Needs assistance Equipment used: 2 person hand held assist Transfers: Sit to/from Stand, Bed to chair/wheelchair/BSC Sit to Stand: Mod assist, +2 physical assistance Stand pivot transfers: Max assist, +2 safety/equipment, +2 physical assistance         General transfer comment: STS from EOB with mod A +2 for power up and cues for upright posture and to bring hips over BOS. Stand pivot with max A +2    Ambulation/Gait                   Stairs             Wheelchair Mobility     Tilt Bed    Modified Rankin (Stroke Patients Only)       Balance Overall balance assessment: Needs assistance Sitting-balance support: Feet supported Sitting balance-Leahy Scale: Fair Sitting balance - Comments: sitting EOB with CGA-minA due to anterior lean   Standing balance support: Bilateral upper extremity supported, During functional activity, Reliant on assistive device for balance Standing balance-Leahy Scale: Poor Standing balance comment: reliant on therapists.                            Communication Communication Communication: Impaired Factors Affecting Communication: Reduced clarity of speech  Cognition Arousal: Alert Behavior During Therapy: WFL for tasks assessed/performed   PT - Cognitive impairments: No apparent impairments                       PT - Cognition Comments: Limited verbalizations during  session Following commands: Impaired Following commands impaired: Follows one step commands with increased time    Cueing Cueing Techniques: Verbal cues, Tactile cues  Exercises      General Comments        Pertinent Vitals/Pain Pain Assessment Pain Assessment: Faces Faces Pain Scale: Hurts a little bit Pain Location: generalized with movement Pain Descriptors / Indicators:  Grimacing Pain Intervention(s): Limited activity within patient's tolerance, Monitored during session, Repositioned     PT Goals (current goals can now be found in the care plan section) Acute Rehab PT Goals Patient Stated Goal: return to living at her house PT Goal Formulation: With patient Time For Goal Achievement: 02/20/24 Progress towards PT goals: Progressing toward goals    Frequency    Min 2X/week       AM-PAC PT 6 Clicks Mobility   Outcome Measure  Help needed turning from your back to your side while in a flat bed without using bedrails?: A Lot Help needed moving from lying on your back to sitting on the side of a flat bed without using bedrails?: A Lot Help needed moving to and from a bed to a chair (including a wheelchair)?: Total Help needed standing up from a chair using your arms (e.g., wheelchair or bedside chair)?: Total Help needed to walk in hospital room?: Total Help needed climbing 3-5 steps with a railing? : Total 6 Click Score: 8    End of Session Equipment Utilized During Treatment: Gait belt;Oxygen Activity Tolerance: Patient tolerated treatment well;Patient limited by fatigue Patient left: in chair;with call bell/phone within reach;with chair alarm set;with nursing/sitter in room Nurse Communication: Mobility status;Need for lift equipment PT Visit Diagnosis: Unsteadiness on feet (R26.81);Muscle weakness (generalized) (M62.81);Hemiplegia and hemiparesis Hemiplegia - Right/Left: Left Hemiplegia - dominant/non-dominant: Non-dominant Hemiplegia - caused by: Cerebral infarction     Time: 1143-1201 PT Time Calculation (min) (ACUTE ONLY): 18 min  Charges:    $Therapeutic Activity: 8-22 mins PT General Charges $$ ACUTE PT VISIT: 1 Visit                     Darryle George, PTA Acute Rehabilitation Services Secure Chat Preferred  [phone]    Darryle George 02/13/2024, 1:12 PM

## 2024-02-13 NOTE — Progress Notes (Signed)
   02/13/24 1700  Assess: MEWS Score  BP (!) 140/60  MAP (mmHg) 82  Pulse Rate 80  ECG Heart Rate 77  Resp 18  Level of Consciousness Alert  Provider Notification  Provider Name/Title Dr. Trixie  Date Provider Notified 02/13/24  Time Provider Notified 1700  Method of Notification Page (secure chat)  Notification Reason  (Chest pain, discomfort, trouble in belching)  Provider response At bedside;See new orders  Date of Provider Response 02/13/24  Time of Provider Response 1705

## 2024-02-13 NOTE — Plan of Care (Signed)
  Problem: Metabolic: Goal: Ability to maintain appropriate glucose levels will improve Outcome: Progressing   Problem: Nutritional: Goal: Maintenance of adequate nutrition will improve Outcome: Progressing   Problem: Skin Integrity: Goal: Risk for impaired skin integrity will decrease Outcome: Progressing   Problem: Education: Goal: Knowledge of General Education information will improve Description: Including pain rating scale, medication(s)/side effects and non-pharmacologic comfort measures Outcome: Progressing   Problem: Clinical Measurements: Goal: Respiratory complications will improve Outcome: Progressing Goal: Cardiovascular complication will be avoided Outcome: Progressing   Problem: Elimination: Goal: Will not experience complications related to bowel motility Outcome: Progressing Goal: Will not experience complications related to urinary retention Outcome: Progressing   Problem: Skin Integrity: Goal: Risk for impaired skin integrity will decrease Outcome: Progressing   Problem: Ischemic Stroke/TIA Tissue Perfusion: Goal: Complications of ischemic stroke/TIA will be minimized Outcome: Progressing   Problem: Coping: Goal: Will identify appropriate support needs Outcome: Progressing   Problem: Self-Care: Goal: Ability to communicate needs accurately will improve Outcome: Progressing

## 2024-02-14 DIAGNOSIS — I5033 Acute on chronic diastolic (congestive) heart failure: Secondary | ICD-10-CM | POA: Diagnosis not present

## 2024-02-14 LAB — CBC
HCT: 39.6 % (ref 36.0–46.0)
Hemoglobin: 11.7 g/dL — ABNORMAL LOW (ref 12.0–15.0)
MCH: 26.1 pg (ref 26.0–34.0)
MCHC: 29.5 g/dL — ABNORMAL LOW (ref 30.0–36.0)
MCV: 88.2 fL (ref 80.0–100.0)
Platelets: 173 K/uL (ref 150–400)
RBC: 4.49 MIL/uL (ref 3.87–5.11)
RDW: 17.5 % — ABNORMAL HIGH (ref 11.5–15.5)
WBC: 10.3 K/uL (ref 4.0–10.5)
nRBC: 0 % (ref 0.0–0.2)

## 2024-02-14 LAB — GLUCOSE, CAPILLARY
Glucose-Capillary: 207 mg/dL — ABNORMAL HIGH (ref 70–99)
Glucose-Capillary: 193 mg/dL — ABNORMAL HIGH (ref 70–99)
Glucose-Capillary: 224 mg/dL — ABNORMAL HIGH (ref 70–99)
Glucose-Capillary: 263 mg/dL — ABNORMAL HIGH (ref 70–99)

## 2024-02-14 LAB — BASIC METABOLIC PANEL WITH GFR
Anion gap: 6 (ref 5–15)
BUN: 33 mg/dL — ABNORMAL HIGH (ref 8–23)
CO2: 27 mmol/L (ref 22–32)
Calcium: 9 mg/dL (ref 8.9–10.3)
Chloride: 114 mmol/L — ABNORMAL HIGH (ref 98–111)
Creatinine, Ser: 0.91 mg/dL (ref 0.44–1.00)
GFR, Estimated: >60 mL/min (ref >60)
Glucose, Bld: 248 mg/dL — ABNORMAL HIGH (ref 70–99)
Potassium: 3.7 mmol/L (ref 3.5–5.1)
Sodium: 147 mmol/L — ABNORMAL HIGH (ref 135–145)

## 2024-02-14 LAB — PHOSPHORUS: Phosphorus: 3.1 mg/dL (ref 2.5–4.6)

## 2024-02-14 LAB — MAGNESIUM: Magnesium: 2 mg/dL (ref 1.7–2.4)

## 2024-02-14 NOTE — Progress Notes (Signed)
 PROGRESS NOTE  Christina Bennett FMW:[id number] DOB: 04-21-1941 DOA: 02/02/2024 PCP: Ransom Other, MD   LOS: 11 days   Brief Narrative / Interim history: 83 y.o. F with COPD not on home O2, obesity, CAD, CKD IIIa baseline 0.8-1.1 who presented with dizziness, blurry vision, HA, and decrased appetite. CXR in the ER showed bilateral opacities, so she was admitted for pneumonia and possible dCHF flare. After admission, she developed acute left sided weakness overnight, CODE STROKE called, given TNK and transferred to ICU.  Subjective / 24h Interval events: Complains of sore throat, no chest pain, no shortness of breath, no nausea or vomiting  Assesement and Plan: Principal Problem:   Acute on chronic diastolic CHF (congestive heart failure) (HCC) Active Problems:   AKI (acute kidney injury) (HCC)   Dysphagia   Iron deficiency anemia   Non-insulin  dependent type 2 diabetes mellitus (HCC)   COPD (chronic obstructive pulmonary disease) (HCC)   Acute metabolic encephalopathy   History of CAD (coronary artery disease)   Chronic kidney disease (CKD), stage IIIa (moderate) (HCC)   Essential hypertension   CVA (cerebral vascular accident) (HCC)   Acute respiratory failure with hypoxia (HCC)   Morbid obesity (HCC)   Malnutrition of moderate degree   Principal problem Acute on chronic diastolic CHF -patient admitted to the hospital with fluid overload, received Lasix , this has been discontinued given elevation in the creatinine.  Creatinine is now stabilized, back to normal at 0.9, resume torsemide  today   Active problems AKI on CKD IIIa  - Cr up to 1.8 on 7/1 from baseline 0.7-0.9, possibly in the setting of diuresis, losartan .  Received fluids, creatinine has normalized  Chest pain-on 7/4, new onset.  EKG nonischemic, troponins flat and negative.  Resolved, ? GI in origin given associated belching  CVA (cerebral vascular accident) (HCC) - Overnight after admission, developed acute left  sided weakness and dysarthria. MRI brain showed small pontine infarct. Post-stroke course complicated by dysphagia. Non-invasive angiography showed focal severe right P2 stenosis and moderate vertebral right stenosis. Echocardiogram showed no cardiogenic source of embolism. Carotid imaging unremarkable. Continue Lipitor, Plavix , she is allergic to aspirin.  PT recommends SNF, placement pending  Dysphagia - Has a history of esophageal stricture.  Since her stroke, she has had marked oropharyngeal dysphagia. She is agreeable to a PEG tube but unlikely to happen over the long weekend -SLP consulted and following.  Underwent a core track placement 7/3, now on tube feeds.  PEG tube placement hopefully next week    Acute respiratory failure with hypoxia (HCC) - Due to CHF, as above.  Wean off to room air as tolerated   Pneumonia ruled out - Admitted on antibiotics.  No fever, no leukocytosis.  Antibiotics stopped and patient without new fever or leukocytosis or sputum.  Doubt infection.   Headache- This was her initial presenting complaint.  CT and MRI of the head showed no acute findings, no hematoma, no dissection.  I am uncertain the etiology.  It appears to wax and wane and migrate (frontal, neck/occipital).   Essential hypertension -blood pressure overall acceptable.  Back on losartan  and torsemide    History of CAD (coronary artery disease) - Aspirin allergic. Continue Lipitor, Plavix    Acute metabolic encephalopathy - At baseline has no cognitive impairment, lives independently.  Here she has had waxing and waning decreased mentation.  Likely due to stroke.    COPD (chronic obstructive pulmonary disease) (HCC) - No active flare, continue home inhaler, no wheezing   Non-insulin   dependent type 2 diabetes mellitus (HCC) - Uncontrolled.  A1c 9.2%.  Continue sliding scale   Iron deficiency anemia - Hgb stable   Class 1 obesity - BMI 34, complicates care  Scheduled Meds:  artificial tears  1 drop  Left Eye Daily   atorvastatin   40 mg Oral Daily   budesonide -glycopyrrolate -formoterol   2 puff Inhalation BID   Chlorhexidine  Gluconate Cloth  6 each Topical Daily   clopidogrel   75 mg Oral Daily   enoxaparin  (LOVENOX ) injection  40 mg Subcutaneous Q24H   feeding supplement  237 mL Oral BID BM   feeding supplement (PROSource TF20)  60 mL Per Tube Daily   free water   50 mL Per Tube Q4H   Gerhardt's butt cream   Topical TID   insulin  aspart  0-5 Units Subcutaneous QHS   insulin  aspart  0-9 Units Subcutaneous TID WC   losartan   50 mg Oral Daily   montelukast   10 mg Oral QPM   pantoprazole   40 mg Oral Daily   polyethylene glycol  17 g Oral Daily   prednisoLONE  acetate  1 drop Left Eye Q1200   senna-docusate  1 tablet Oral Daily   sodium chloride  flush  3 mL Intravenous Q12H   sodium chloride  flush  3-10 mL Intravenous Q12H   thiamine   100 mg Per Tube Daily   torsemide   20 mg Oral Daily   valACYclovir   1,000 mg Oral Daily   Continuous Infusions:  feeding supplement (OSMOLITE 1.5 CAL) 45 mL/hr at 02/14/24 0523   PRN Meds:.acetaminophen , bisacodyl , calcium  carbonate, dextrose , diphenhydrAMINE , haloperidol  lactate, hydrALAZINE , HYDROcodone -acetaminophen , ipratropium-albuterol , labetalol , melatonin, ondansetron  **OR** ondansetron  (ZOFRAN ) IV, mouth rinse, phenol, polyethylene glycol, sodium chloride , sodium chloride  flush, sodium chloride  flush  Current Outpatient Medications  Medication Instructions   albuterol  (PROVENTIL  HFA;VENTOLIN  HFA) 108 (90 BASE) MCG/ACT inhaler 2 puffs, Every 4 hours PRN   BREZTRI  AEROSPHERE 160-9-4.8 MCG/ACT AERO 2 puffs, 2 times daily   fluticasone (FLONASE) 50 MCG/ACT nasal spray 2 sprays, Every evening   glimepiride (AMARYL) 2 mg, 2 times daily   HYDROcodone -acetaminophen  (NORCO) 7.5-325 MG tablet 1 tablet, 2 times daily PRN   losartan  (COZAAR ) 100 mg, Daily   montelukast  (SINGULAIR ) 10 mg, Every evening   pantoprazole  (PROTONIX ) 40 mg, Daily   polyvinyl  alcohol  (LIQUIFILM TEARS) 1.4 % ophthalmic solution 1 drop, Daily   prednisoLONE  acetate (PRED FORTE ) 1 % ophthalmic suspension 1 drop, Daily   simvastatin  (ZOCOR ) 40 mg, Every evening   valACYclovir  (VALTREX ) 1,000 mg, Daily   Vitamin D (Ergocalciferol) (DRISDOL) 50,000 Units, Every 14 days    Diet Orders (From admission, onward)     Start     Ordered   02/11/24 1616  DIET - DYS 1 Room service appropriate? Yes; Fluid consistency: Honey Thick  Diet effective now       Question Answer Comment  Room service appropriate? Yes   Fluid consistency: Honey Thick      02/11/24 1615            DVT prophylaxis: enoxaparin  (LOVENOX ) injection 40 mg Start: 02/11/24 2200 SCD's Start: 02/05/24 0218 SCDs Start: 02/03/24 0132 Place TED hose Start: 02/03/24 0132   Lab Results  Component Value Date   PLT 173 02/14/2024      Code Status: Limited: Do not attempt resuscitation (DNR) -DNR-LIMITED -Do Not Intubate/DNI   Family Communication: Niece over the phone  Status is: Inpatient Remains inpatient appropriate because: severity of illness  Level of care: Med-Surg  Consultants:  Palliative Neurology  Objective: Vitals:   02/14/24 0217 02/14/24 0349 02/14/24 0500 02/14/24 0620  BP: (!) 152/62 (!) 153/69  (!) 153/69  Pulse:  70    Resp:      Temp:  98.5 F (36.9 C)    TempSrc:  Oral    SpO2:  97%    Weight:   89.2 kg   Height:        Intake/Output Summary (Last 24 hours) at 02/14/2024 1041 Last data filed at 02/14/2024 0523 Gross per 24 hour  Intake 1889.5 ml  Output 550 ml  Net 1339.5 ml   Wt Readings from Last 3 Encounters:  02/14/24 89.2 kg  09/19/22 89.6 kg  03/20/22 86.4 kg    Examination:  Constitutional: NAD Eyes: lids and conjunctivae normal, no scleral icterus ENMT: mmm Neck: normal, supple Respiratory: clear to auscultation bilaterally, no wheezing, no crackles. Normal respiratory effort.  Cardiovascular: Regular rate and rhythm, no murmurs / rubs /  gallops. No LE edema. Abdomen: soft, no distention, no tenderness. Bowel sounds positive.   Data Reviewed: I have independently reviewed following labs and imaging studies   CBC Recent Labs  Lab 02/08/24 0734 02/09/24 0452 02/10/24 0450 02/11/24 0452 02/14/24 0440  WBC 12.6* 12.8* 12.5* 10.0 10.3  HGB 11.5* 12.2 11.8* 11.3* 11.7*  HCT 40.0 42.2 40.5 40.0 39.6  PLT 199 199 196 185 173  MCV 88.5 87.2 89.0 87.5 88.2  MCH 25.4* 25.2* 25.9* 24.7* 26.1  MCHC 28.8* 28.9* 29.1* 28.3* 29.5*  RDW 17.2* 17.1* 17.2* 17.2* 17.5*  LYMPHSABS 1.9  --   --   --   --   MONOABS 0.9  --   --   --   --   EOSABS 0.4  --   --   --   --   BASOSABS 0.1  --   --   --   --    Recent Labs  Lab 02/08/24 0734 02/09/24 0452 02/10/24 0450 02/11/24 0452 02/12/24 1243 02/13/24 0340 02/14/24 0440  NA 142 143 145 144  --  148* 147*  K 4.1 3.9 4.0 3.6  --  3.5 3.7  CL 110 105 107 112*  --  115* 114*  CO2 21* 27 31 23   --  27 27  GLUCOSE 111* 113* 124* 120*  --  188* 248*  BUN 25* 22 36* 32*  --  31* 33*  CREATININE 0.94 0.88 1.88* 1.31*  --  0.71 0.91  CALCIUM  8.4* 9.0 8.9 8.1*  --  8.9 9.0  AST  --  25 27  --   --   --   --   ALT  --  13 11  --   --   --   --   ALKPHOS  --  75 70  --   --   --   --   BILITOT  --  0.9 0.6  --   --   --   --   ALBUMIN  --  3.0* 2.9*  --   --   --   --   MG 2.3  --   --   --  2.1 2.1 2.0    ------------------------------------------------------------------------------------------------------------------ No results for input(s): CHOL, HDL, LDLCALC, TRIG, CHOLHDL, LDLDIRECT in the last 72 hours.  Lab Results  Component Value Date   HGBA1C 9.2 (H) 02/03/2024   ------------------------------------------------------------------------------------------------------------------ No results for input(s): TSH, T4TOTAL, T3FREE, THYROIDAB in the last 72 hours.  Invalid input(s): FREET3  Cardiac Enzymes No results for input(s):  CKMB, TROPONINI,  MYOGLOBIN in the last 168 hours.  Invalid input(s): CK ------------------------------------------------------------------------------------------------------------------    Component Value Date/Time   BNP 43.6 02/04/2024 0312   CBG: Recent Labs  Lab 02/13/24 0605 02/13/24 1115 02/13/24 1555 02/13/24 2105 02/14/24 0625  GLUCAP 183* 112* 202* 210* 207*    Recent Results (from the past 240 hours)  MRSA Next Gen by PCR, Nasal     Status: None   Collection Time: 02/05/24  3:17 AM   Specimen: Nasal Mucosa; Nasal Swab  Result Value Ref Range Status   MRSA by PCR Next Gen NOT DETECTED NOT DETECTED Final    Comment: (NOTE) The GeneXpert MRSA Assay (FDA approved for NASAL specimens only), is one component of a comprehensive MRSA colonization surveillance program. It is not intended to diagnose MRSA infection nor to guide or monitor treatment for MRSA infections. Test performance is not FDA approved in patients less than 73 years old. Performed at [facility], [address], [place], [name] [address]      Radiology Studies: DG CHEST PORT 1 VIEW Result Date: 02/13/2024 CLINICAL DATA:  Chest pain EXAM: PORTABLE CHEST 1 VIEW COMPARISON:  02/11/2024 FINDINGS: Cardiomegaly. Right base atelectasis. Left lung clear. No effusions or edema. No acute bony abnormality. IMPRESSION: Cardiomegaly. Right base atelectasis. Electronically Signed   By: Franky Crease M.D.   On: 02/13/2024 17:41   Nilda Fendt, MD, PhD [facility]  Between 7 am - 7 pm I am available, please contact me via Amion (for emergencies) or Securechat (non urgent messages)  Between 7 pm - 7 am I am not available, please contact night coverage MD/APP via Amion

## 2024-02-14 NOTE — Progress Notes (Signed)
 Physical Therapy Treatment Patient Details Name: Christina Bennett MRN: [id number] DOB: 1940-12-15 Today's Date: 02/14/2024   History of Present Illness The pt is an 83 yo female presenting 6/23 with AMS. Initial work up with concern for UTI, pt given antibiotics and returned to baseline. Code stroke called 1AM 6/26, pt given TNK and transferred to ICU. 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 PMH includes: COPD, DM II, DVT, HTN, arthritis.    PT Comments  Pt received in supine and agreeable to session. Pt able to sit to EOB with mod A and transfer to the recliner with max A +2. Pt demonstrates some LLE and LUE movement, but is very weak. Pt able to take some shuffled steps with RLE with assist for weight shifting. Pt demonstrates difficulty reaching a full upright posture and demonstrates a flexed trunk throughout. Pt reports some dizziness sitting EOB and demonstrates a progressed anterior lean requiring cues to correct, but BP stable. Pt continues to benefit from PT services to progress toward functional mobility goals.    If plan is discharge home, recommend the following: Two people to help with walking and/or transfers;Two people to help with bathing/dressing/bathroom;Assistance with cooking/housework;Direct supervision/assist for medications management;Direct supervision/assist for financial management;Assist for transportation;Help with stairs or ramp for entrance   Can travel by private vehicle     No  Equipment Recommendations  Wheelchair (measurements PT);Wheelchair cushion (measurements PT);Hoyer lift    Recommendations for [employer]       Precautions / Restrictions Precautions Precautions: Fall Recall of Precautions/Restrictions: Impaired Precaution/Restrictions Comments: SBP <180 Restrictions Weight Bearing Restrictions Per Provider Order: No     Mobility  Bed  Mobility Overal bed mobility: Needs Assistance Bed Mobility: Supine to Sit     Supine to sit: Mod assist     General bed mobility comments: mod A for BLE advancement to EOB, rolling to grab hand rail, and trunk elevation. Dense cues for step by step sequencing    Transfers Overall transfer level: Needs assistance Equipment used: 2 person hand held assist Transfers: Sit to/from Stand, Bed to chair/wheelchair/BSC Sit to Stand: Mod assist, +2 physical assistance   Step pivot transfers: Max assist, +2 physical assistance       General transfer comment: STS from EOB with mod A +2 for power up and L knee blocking. cues for upright posture and to bring hips over BOS. Step pivot with max A +2 with pt able to shuffle with RLE, but requires assist to advance LLE    Ambulation/Gait               General Gait Details: pt unable   Stairs             Wheelchair Mobility     Tilt Bed    Modified Rankin (Stroke Patients Only) Modified Rankin (Stroke Patients Only) Pre-Morbid Rankin Score: No symptoms Modified Rankin: Severe disability     Balance Overall balance assessment: Needs assistance Sitting-balance support: Feet supported Sitting balance-Leahy Scale: Fair Sitting balance - Comments: sitting EOB with CGA-minA due to anterior lean   Standing balance support: Bilateral upper extremity supported, During functional activity, Reliant on assistive device for balance Standing balance-Leahy Scale: Poor Standing balance comment: reliant on therapists.                            Communication Communication  Communication: Impaired Factors Affecting Communication: Reduced clarity of speech  Cognition Arousal: Alert Behavior During Therapy: WFL for tasks assessed/performed   PT - Cognitive impairments: No apparent impairments                         Following commands: Impaired Following commands impaired: Follows one step commands with  increased time    Cueing Cueing Techniques: Verbal cues, Tactile cues  Exercises      General Comments        Pertinent Vitals/Pain Pain Assessment Pain Assessment: Faces Faces Pain Scale: Hurts a little bit Pain Location: L elbow with mobility Pain Descriptors / Indicators: Grimacing Pain Intervention(s): Monitored during session, Repositioned     PT Goals (current goals can now be found in the care plan section) Acute Rehab PT Goals Patient Stated Goal: return to living at her house PT Goal Formulation: With patient Time For Goal Achievement: 02/20/24 Progress towards PT goals: Progressing toward goals    Frequency    Min 2X/week       AM-PAC PT 6 Clicks Mobility   Outcome Measure  Help needed turning from your back to your side while in a flat bed without using bedrails?: A Lot Help needed moving from lying on your back to sitting on the side of a flat bed without using bedrails?: A Lot Help needed moving to and from a bed to a chair (including a wheelchair)?: Total Help needed standing up from a chair using your arms (e.g., wheelchair or bedside chair)?: Total Help needed to walk in hospital room?: Total Help needed climbing 3-5 steps with a railing? : Total 6 Click Score: 8    End of Session Equipment Utilized During Treatment: Gait belt;Oxygen Activity Tolerance: Patient tolerated treatment well;Patient limited by fatigue Patient left: in chair;with call bell/phone within reach;with chair alarm set Nurse Communication: Mobility status;Need for lift equipment PT Visit Diagnosis: Unsteadiness on feet (R26.81);Muscle weakness (generalized) (M62.81);Hemiplegia and hemiparesis Hemiplegia - Right/Left: Left Hemiplegia - dominant/non-dominant: Non-dominant Hemiplegia - caused by: Cerebral infarction     Time: 8893-8860 PT Time Calculation (min) (ACUTE ONLY): 33 min  Charges:    $Therapeutic Activity: 23-37 mins PT General Charges $$ ACUTE PT VISIT: 1  Visit                     Darryle George, PTA Acute Rehabilitation Services Secure Chat Preferred  [phone]    Darryle George 02/14/2024, 12:01 PM

## 2024-02-15 DIAGNOSIS — I5033 Acute on chronic diastolic (congestive) heart failure: Secondary | ICD-10-CM | POA: Diagnosis not present

## 2024-02-15 LAB — GLUCOSE, CAPILLARY
Glucose-Capillary: 321 mg/dL — ABNORMAL HIGH (ref 70–99)
Glucose-Capillary: 307 mg/dL — ABNORMAL HIGH (ref 70–99)
Glucose-Capillary: 315 mg/dL — ABNORMAL HIGH (ref 70–99)
Glucose-Capillary: 351 mg/dL — ABNORMAL HIGH (ref 70–99)

## 2024-02-15 LAB — BASIC METABOLIC PANEL WITH GFR
Anion gap: 9 (ref 5–15)
BUN: 41 mg/dL — ABNORMAL HIGH (ref 8–23)
CO2: 32 mmol/L (ref 22–32)
Calcium: 9.1 mg/dL (ref 8.9–10.3)
Chloride: 105 mmol/L (ref 98–111)
Creatinine, Ser: 1.11 mg/dL — ABNORMAL HIGH (ref 0.44–1.00)
GFR, Estimated: 50 mL/min — ABNORMAL LOW (ref >60)
Glucose, Bld: 316 mg/dL — ABNORMAL HIGH (ref 70–99)
Potassium: 3.7 mmol/L (ref 3.5–5.1)
Sodium: 146 mmol/L — ABNORMAL HIGH (ref 135–145)

## 2024-02-15 LAB — MAGNESIUM: Magnesium: 1.9 mg/dL (ref 1.7–2.4)

## 2024-02-15 LAB — PHOSPHORUS: Phosphorus: 3.3 mg/dL (ref 2.5–4.6)

## 2024-02-15 NOTE — Progress Notes (Signed)
 PROGRESS NOTE  Christina Bennett FMW:[id number] DOB: [birth year] DOA: 02/02/2024 PCP: Ransom Other, MD   LOS: 12 days   Brief Narrative / Interim history: 83 y.o. F with COPD not on home O2, obesity, CAD, CKD IIIa baseline 0.8-1.1 who presented with dizziness, blurry vision, HA, and decrased appetite. CXR in the ER showed bilateral opacities, so she was admitted for pneumonia and possible dCHF flare. After admission, she developed acute left sided weakness overnight, CODE STROKE called, given TNK and transferred to ICU.  Subjective / 24h Interval events: No complaints this morning  Assesement and Plan: Principal Problem:   Acute on chronic diastolic CHF (congestive heart failure) (HCC) Active Problems:   AKI (acute kidney injury) (HCC)   Dysphagia   Iron deficiency anemia   Non-insulin  dependent type 2 diabetes mellitus (HCC)   COPD (chronic obstructive pulmonary disease) (HCC)   Acute metabolic encephalopathy   History of CAD (coronary artery disease)   Chronic kidney disease (CKD), stage IIIa (moderate) (HCC)   Essential hypertension   CVA (cerebral vascular accident) (HCC)   Acute respiratory failure with hypoxia (HCC)   Morbid obesity (HCC)   Malnutrition of moderate degree   Principal problem Acute on chronic diastolic CHF -patient admitted to the hospital with fluid overload, received Lasix , this has been discontinued given elevation in the creatinine.  Slightly on the dry side, hypernatremic, hold off on torsemide   Active problems AKI on CKD IIIa  - Cr up to 1.8 on 7/1 from baseline 0.7-0.9, possibly in the setting of diuresis, losartan .  Received fluids, creatinine has normalized  Chest pain-on 7/4, new onset.  EKG nonischemic, troponins flat and negative.  Resolved, ? GI in origin given associated belching  CVA (cerebral vascular accident) (HCC) - Overnight after admission, developed acute left sided weakness and dysarthria. MRI brain showed small pontine infarct.  Post-stroke course complicated by dysphagia. Non-invasive angiography showed focal severe right P2 stenosis and moderate vertebral right stenosis. Echocardiogram showed no cardiogenic source of embolism. Carotid imaging unremarkable. Continue Lipitor, Plavix , she is allergic to aspirin.  PT recommends SNF, placement pending  Dysphagia - Has a history of esophageal stricture.  Since her stroke, she has had marked oropharyngeal dysphagia. She is agreeable to a PEG tube but unlikely to happen over the long weekend -SLP consulted and following.  Underwent a core track placement 7/3, now on tube feeds. -hold Plavix  in preparation for PEG tube next Friday     Acute respiratory failure with hypoxia (HCC) - Due to CHF, as above.  Wean off to room air as tolerated   Pneumonia ruled out - Admitted on antibiotics.  No fever, no leukocytosis.  Antibiotics stopped and patient without new fever or leukocytosis or sputum.  Doubt infection.   Headache- This was her initial presenting complaint.  Now resolved   Essential hypertension -blood pressure acceptable.  Continue losartan    History of CAD (coronary artery disease) - Aspirin allergic. Continue Lipitor, Plavix    Acute metabolic encephalopathy - At baseline has no cognitive impairment, lives independently.  Here she has had waxing and waning decreased mentation.  Likely due to stroke.    COPD (chronic obstructive pulmonary disease) (HCC) - No active flare, continue home inhaler, no wheezing   Non-insulin  dependent type 2 diabetes mellitus (HCC) - Uncontrolled.  A1c 9.2%.  Continue sliding scale   Iron deficiency anemia - Hgb stable   Class 1 obesity - BMI 34, complicates care  Scheduled Meds:  artificial tears  1 drop Left Eye  Daily   atorvastatin   40 mg Oral Daily   budesonide -glycopyrrolate -formoterol   2 puff Inhalation BID   Chlorhexidine  Gluconate Cloth  6 each Topical Daily   enoxaparin  (LOVENOX ) injection  40 mg Subcutaneous Q24H   feeding  supplement  237 mL Oral BID BM   feeding supplement (PROSource TF20)  60 mL Per Tube Daily   free water   50 mL Per Tube Q4H   Gerhardt's butt cream   Topical TID   insulin  aspart  0-5 Units Subcutaneous QHS   insulin  aspart  0-9 Units Subcutaneous TID WC   losartan   50 mg Oral Daily   montelukast   10 mg Oral QPM   pantoprazole   40 mg Oral Daily   polyethylene glycol  17 g Oral Daily   prednisoLONE  acetate  1 drop Left Eye Q1200   senna-docusate  1 tablet Oral Daily   sodium chloride  flush  3 mL Intravenous Q12H   sodium chloride  flush  3-10 mL Intravenous Q12H   thiamine   100 mg Per Tube Daily   valACYclovir   1,000 mg Oral Daily   Continuous Infusions:  feeding supplement (OSMOLITE 1.5 CAL) 1,000 mL (02/14/24 2000)   PRN Meds:.acetaminophen , bisacodyl , calcium  carbonate, dextrose , diphenhydrAMINE , haloperidol  lactate, hydrALAZINE , HYDROcodone -acetaminophen , ipratropium-albuterol , labetalol , melatonin, ondansetron  **OR** ondansetron  (ZOFRAN ) IV, mouth rinse, phenol, polyethylene glycol, sodium chloride , sodium chloride  flush, sodium chloride  flush  Current Outpatient Medications  Medication Instructions   albuterol  (PROVENTIL  HFA;VENTOLIN  HFA) 108 (90 BASE) MCG/ACT inhaler 2 puffs, Every 4 hours PRN   BREZTRI  AEROSPHERE 160-9-4.8 MCG/ACT AERO 2 puffs, 2 times daily   fluticasone (FLONASE) 50 MCG/ACT nasal spray 2 sprays, Every evening   glimepiride (AMARYL) 2 mg, 2 times daily   HYDROcodone -acetaminophen  (NORCO) 7.5-325 MG tablet 1 tablet, 2 times daily PRN   losartan  (COZAAR ) 100 mg, Daily   montelukast  (SINGULAIR ) 10 mg, Every evening   pantoprazole  (PROTONIX ) 40 mg, Daily   polyvinyl alcohol  (LIQUIFILM TEARS) 1.4 % ophthalmic solution 1 drop, Daily   prednisoLONE  acetate (PRED FORTE ) 1 % ophthalmic suspension 1 drop, Daily   simvastatin  (ZOCOR ) 40 mg, Every evening   valACYclovir  (VALTREX ) 1,000 mg, Daily   Vitamin D (Ergocalciferol) (DRISDOL) 50,000 Units, Every 14 days     Diet Orders (From admission, onward)     Start     Ordered   02/11/24 1616  DIET - DYS 1 Room service appropriate? Yes; Fluid consistency: Honey Thick  Diet effective now       Question Answer Comment  Room service appropriate? Yes   Fluid consistency: Honey Thick      02/11/24 1615            DVT prophylaxis: enoxaparin  (LOVENOX ) injection 40 mg Start: 02/11/24 2200 SCD's Start: 02/05/24 0218 SCDs Start: 02/03/24 0132 Place TED hose Start: 02/03/24 0132   Lab Results  Component Value Date   PLT 173 02/14/2024      Code Status: Limited: Do not attempt resuscitation (DNR) -DNR-LIMITED -Do Not Intubate/DNI   Family Communication: Niece over the phone  Status is: Inpatient Remains inpatient appropriate because: severity of illness  Level of care: Med-Surg  Consultants:  Palliative Neurology   Objective: Vitals:   02/15/24 0507 02/15/24 0620 02/15/24 0842 02/15/24 0854  BP: 129/65 129/65  (!) 151/67  Pulse: 73   79  Resp:    17  Temp: 98.6 F (37 C)   97.8 F (36.6 C)  TempSrc: Oral   Oral  SpO2: 98%  96% 98%  Weight:  Height:        Intake/Output Summary (Last 24 hours) at 02/15/2024 1005 Last data filed at 02/15/2024 0433 Gross per 24 hour  Intake 120 ml  Output 400 ml  Net -280 ml   Wt Readings from Last 3 Encounters:  02/15/24 89.5 kg  09/19/22 89.6 kg  03/20/22 86.4 kg    Examination:  Constitutional: NAD Eyes: lids and conjunctivae normal, no scleral icterus ENMT: mmm Neck: normal, supple Respiratory: clear to auscultation bilaterally, no wheezing, no crackles. Normal respiratory effort.  Cardiovascular: Regular rate and rhythm, no murmurs / rubs / gallops. No LE edema. Abdomen: soft, no distention, no tenderness. Bowel sounds positive.   Data Reviewed: I have independently reviewed following labs and imaging studies   CBC Recent Labs  Lab 02/09/24 0452 02/10/24 0450 02/11/24 0452 02/14/24 0440  WBC 12.8* 12.5* 10.0 10.3   HGB 12.2 11.8* 11.3* 11.7*  HCT 42.2 40.5 40.0 39.6  PLT 199 196 185 173  MCV 87.2 89.0 87.5 88.2  MCH 25.2* 25.9* 24.7* 26.1  MCHC 28.9* 29.1* 28.3* 29.5*  RDW 17.1* 17.2* 17.2* 17.5*   Recent Labs  Lab 02/09/24 0452 02/10/24 0450 02/11/24 0452 02/12/24 1243 02/13/24 0340 02/14/24 0440 02/15/24 0337  NA 143 145 144  --  148* 147* 146*  K 3.9 4.0 3.6  --  3.5 3.7 3.7  CL 105 107 112*  --  115* 114* 105  CO2 27 31 23   --  27 27 32  GLUCOSE 113* 124* 120*  --  188* 248* 316*  BUN 22 36* 32*  --  31* 33* 41*  CREATININE 0.88 1.88* 1.31*  --  0.71 0.91 1.11*  CALCIUM  9.0 8.9 8.1*  --  8.9 9.0 9.1  AST 25 27  --   --   --   --   --   ALT 13 11  --   --   --   --   --   ALKPHOS 75 70  --   --   --   --   --   BILITOT 0.9 0.6  --   --   --   --   --   ALBUMIN 3.0* 2.9*  --   --   --   --   --   MG  --   --   --  2.1 2.1 2.0 1.9    ------------------------------------------------------------------------------------------------------------------ No results for input(s): CHOL, HDL, LDLCALC, TRIG, CHOLHDL, LDLDIRECT in the last 72 hours.  Lab Results  Component Value Date   HGBA1C 9.2 (H) 02/03/2024   ------------------------------------------------------------------------------------------------------------------ No results for input(s): TSH, T4TOTAL, T3FREE, THYROIDAB in the last 72 hours.  Invalid input(s): FREET3  Cardiac Enzymes No results for input(s): CKMB, TROPONINI, MYOGLOBIN in the last 168 hours.  Invalid input(s): CK ------------------------------------------------------------------------------------------------------------------    Component Value Date/Time   BNP 43.6 02/04/2024 0312   CBG: Recent Labs  Lab 02/14/24 0625 02/14/24 1147 02/14/24 1702 02/14/24 2112 02/15/24 0613  GLUCAP 207* 193* 224* 263* 321*    No results found for this or any previous visit (from the past 240 hours).    Radiology Studies: No  results found.  Nilda Fendt, MD, PhD [facility]  Between 7 am - 7 pm I am available, please contact me via Amion (for emergencies) or Securechat (non urgent messages)  Between 7 pm - 7 am I am not available, please contact night coverage MD/APP via Amion

## 2024-02-15 NOTE — Plan of Care (Signed)
  Problem: Education: Goal: Ability to describe self-care measures that may prevent or decrease complications (Diabetes Survival Skills Education) will improve Outcome: Progressing Goal: Individualized Educational Video(s) Outcome: Progressing   Problem: Coping: Goal: Ability to adjust to condition or change in health will improve Outcome: Progressing   Problem: Fluid Volume: Goal: Ability to maintain a balanced intake and output will improve Outcome: Progressing   Problem: Health Behavior/Discharge Planning: Goal: Ability to identify and utilize available resources and services will improve Outcome: Progressing Goal: Ability to manage health-related needs will improve Outcome: Progressing   Problem: Metabolic: Goal: Ability to maintain appropriate glucose levels will improve Outcome: Progressing   Problem: Nutritional: Goal: Maintenance of adequate nutrition will improve Outcome: Progressing Goal: Progress toward achieving an optimal weight will improve Outcome: Progressing   Problem: Skin Integrity: Goal: Risk for impaired skin integrity will decrease Outcome: Progressing   Problem: Tissue Perfusion: Goal: Adequacy of tissue perfusion will improve Outcome: Progressing   Problem: Education: Goal: Knowledge of General Education information will improve Description: Including pain rating scale, medication(s)/side effects and non-pharmacologic comfort measures Outcome: Progressing   Problem: Health Behavior/Discharge Planning: Goal: Ability to manage health-related needs will improve Outcome: Progressing   Problem: Clinical Measurements: Goal: Ability to maintain clinical measurements within normal limits will improve Outcome: Progressing Goal: Will remain free from infection Outcome: Progressing Goal: Diagnostic test results will improve Outcome: Progressing Goal: Respiratory complications will improve Outcome: Progressing Goal: Cardiovascular complication will  be avoided Outcome: Progressing   Problem: Activity: Goal: Risk for activity intolerance will decrease Outcome: Progressing   Problem: Nutrition: Goal: Adequate nutrition will be maintained Outcome: Progressing   Problem: Coping: Goal: Level of anxiety will decrease Outcome: Progressing   Problem: Elimination: Goal: Will not experience complications related to bowel motility Outcome: Progressing Goal: Will not experience complications related to urinary retention Outcome: Progressing   Problem: Pain Managment: Goal: General experience of comfort will improve and/or be controlled Outcome: Progressing   Problem: Safety: Goal: Ability to remain free from injury will improve Outcome: Progressing   Problem: Skin Integrity: Goal: Risk for impaired skin integrity will decrease Outcome: Progressing   Problem: Activity: Goal: Ability to tolerate increased activity will improve Outcome: Progressing   Problem: Clinical Measurements: Goal: Ability to maintain a body temperature in the normal range will improve Outcome: Progressing   Problem: Respiratory: Goal: Ability to maintain adequate ventilation will improve Outcome: Progressing Goal: Ability to maintain a clear airway will improve Outcome: Progressing   Problem: Education: Goal: Knowledge of disease or condition will improve Outcome: Progressing Goal: Knowledge of secondary prevention will improve (MUST DOCUMENT ALL) Outcome: Progressing Goal: Knowledge of patient specific risk factors will improve (DELETE if not current risk factor) Outcome: Progressing   Problem: Ischemic Stroke/TIA Tissue Perfusion: Goal: Complications of ischemic stroke/TIA will be minimized Outcome: Progressing   Problem: Coping: Goal: Will verbalize positive feelings about self Outcome: Progressing Goal: Will identify appropriate support needs Outcome: Progressing   Problem: Health Behavior/Discharge Planning: Goal: Ability to manage  health-related needs will improve Outcome: Progressing Goal: Goals will be collaboratively established with patient/family Outcome: Progressing   Problem: Self-Care: Goal: Ability to participate in self-care as condition permits will improve Outcome: Progressing Goal: Verbalization of feelings and concerns over difficulty with self-care will improve Outcome: Progressing Goal: Ability to communicate needs accurately will improve Outcome: Progressing   Problem: Nutrition: Goal: Risk of aspiration will decrease Outcome: Progressing Goal: Dietary intake will improve Outcome: Progressing

## 2024-02-15 NOTE — Plan of Care (Signed)
 IR was requested for G tube placement.   CT abdomen w/o reviewed with Dr. Jenna, anatomy amenable for percutaneous G tube placement.   Discussed with neurology, ok to d/c Plavix . Last dose Plavix  today, will plan to proceed with G tube placement on Friday.   Formal consult to follow.   Please call IR for questions and concerns.    Sharne Linders H Zacariah Belue PA-C 02/15/2024 10:15 AM

## 2024-02-15 NOTE — Plan of Care (Signed)
 Problem: Education: Goal: Ability to describe self-care measures that may prevent or decrease complications (Diabetes Survival Skills Education) will improve 02/15/2024 1440 by Jenel Bobetta SAILOR, RN Outcome: Progressing 02/15/2024 1237 by Jenel Bobetta SAILOR, RN Outcome: Progressing Goal: Individualized Educational Video(s) 02/15/2024 1440 by Jenel Bobetta SAILOR, RN Outcome: Progressing 02/15/2024 1237 by Jenel Bobetta SAILOR, RN Outcome: Progressing   Problem: Coping: Goal: Ability to adjust to condition or change in health will improve 02/15/2024 1440 by Jenel Bobetta SAILOR, RN Outcome: Progressing 02/15/2024 1237 by Jenel Bobetta SAILOR, RN Outcome: Progressing   Problem: Fluid Volume: Goal: Ability to maintain a balanced intake and output will improve 02/15/2024 1440 by Jenel Bobetta SAILOR, RN Outcome: Progressing 02/15/2024 1237 by Jenel Bobetta SAILOR, RN Outcome: Progressing   Problem: Health Behavior/Discharge Planning: Goal: Ability to identify and utilize available resources and services will improve 02/15/2024 1440 by Jenel Bobetta SAILOR, RN Outcome: Progressing 02/15/2024 1237 by Jenel Bobetta SAILOR, RN Outcome: Progressing Goal: Ability to manage health-related needs will improve 02/15/2024 1440 by Jenel Bobetta SAILOR, RN Outcome: Progressing 02/15/2024 1237 by Jenel Bobetta SAILOR, RN Outcome: Progressing   Problem: Metabolic: Goal: Ability to maintain appropriate glucose levels will improve 02/15/2024 1440 by Jenel Bobetta SAILOR, RN Outcome: Progressing 02/15/2024 1237 by Jenel Bobetta SAILOR, RN Outcome: Progressing   Problem: Nutritional: Goal: Maintenance of adequate nutrition will improve 02/15/2024 1440 by Jenel Bobetta SAILOR, RN Outcome: Progressing 02/15/2024 1237 by Jenel Bobetta SAILOR, RN Outcome: Progressing Goal: Progress toward achieving an optimal weight will improve 02/15/2024 1440 by Jenel Bobetta SAILOR, RN Outcome: Progressing 02/15/2024 1237 by Jenel Bobetta SAILOR, RN Outcome: Progressing   Problem: Skin Integrity: Goal: Risk for  impaired skin integrity will decrease 02/15/2024 1440 by Jenel Bobetta SAILOR, RN Outcome: Progressing 02/15/2024 1237 by Jenel Bobetta SAILOR, RN Outcome: Progressing   Problem: Tissue Perfusion: Goal: Adequacy of tissue perfusion will improve 02/15/2024 1440 by Jenel Bobetta SAILOR, RN Outcome: Progressing 02/15/2024 1237 by Jenel Bobetta SAILOR, RN Outcome: Progressing   Problem: Education: Goal: Knowledge of General Education information will improve Description: Including pain rating scale, medication(s)/side effects and non-pharmacologic comfort measures 02/15/2024 1440 by Jenel Bobetta SAILOR, RN Outcome: Progressing 02/15/2024 1237 by Jenel Bobetta SAILOR, RN Outcome: Progressing   Problem: Health Behavior/Discharge Planning: Goal: Ability to manage health-related needs will improve 02/15/2024 1440 by Jenel Bobetta SAILOR, RN Outcome: Progressing 02/15/2024 1237 by Jenel Bobetta SAILOR, RN Outcome: Progressing   Problem: Clinical Measurements: Goal: Ability to maintain clinical measurements within normal limits will improve 02/15/2024 1440 by Jenel Bobetta SAILOR, RN Outcome: Progressing 02/15/2024 1237 by Jenel Bobetta SAILOR, RN Outcome: Progressing Goal: Will remain free from infection 02/15/2024 1440 by Jenel Bobetta SAILOR, RN Outcome: Progressing 02/15/2024 1237 by Jenel Bobetta SAILOR, RN Outcome: Progressing Goal: Diagnostic test results will improve 02/15/2024 1440 by Jenel Bobetta SAILOR, RN Outcome: Progressing 02/15/2024 1237 by Jenel Bobetta SAILOR, RN Outcome: Progressing Goal: Respiratory complications will improve 02/15/2024 1440 by Jenel Bobetta SAILOR, RN Outcome: Progressing 02/15/2024 1237 by Jenel Bobetta SAILOR, RN Outcome: Progressing Goal: Cardiovascular complication will be avoided 02/15/2024 1440 by Jenel Bobetta SAILOR, RN Outcome: Progressing 02/15/2024 1237 by Jenel Bobetta SAILOR, RN Outcome: Progressing   Problem: Activity: Goal: Risk for activity intolerance will decrease 02/15/2024 1440 by Jenel Bobetta SAILOR, RN Outcome: Progressing 02/15/2024 1237  by Jenel Bobetta SAILOR, RN Outcome: Progressing   Problem: Nutrition: Goal: Adequate nutrition will be maintained 02/15/2024 1440 by Jenel Bobetta SAILOR, RN Outcome: Progressing 02/15/2024 1237 by Jenel Bobetta SAILOR, RN Outcome: Progressing  Problem: Coping: Goal: Level of anxiety will decrease 02/15/2024 1440 by Jenel Bobetta SAILOR, RN Outcome: Progressing 02/15/2024 1237 by Jenel Bobetta SAILOR, RN Outcome: Progressing   Problem: Elimination: Goal: Will not experience complications related to bowel motility 02/15/2024 1440 by Jenel Bobetta SAILOR, RN Outcome: Progressing 02/15/2024 1237 by Jenel Bobetta SAILOR, RN Outcome: Progressing Goal: Will not experience complications related to urinary retention 02/15/2024 1440 by Jenel Bobetta SAILOR, RN Outcome: Progressing 02/15/2024 1237 by Jenel Bobetta SAILOR, RN Outcome: Progressing   Problem: Pain Managment: Goal: General experience of comfort will improve and/or be controlled 02/15/2024 1440 by Jenel Bobetta SAILOR, RN Outcome: Progressing 02/15/2024 1237 by Jenel Bobetta SAILOR, RN Outcome: Progressing   Problem: Safety: Goal: Ability to remain free from injury will improve 02/15/2024 1440 by Jenel Bobetta SAILOR, RN Outcome: Progressing 02/15/2024 1237 by Jenel Bobetta SAILOR, RN Outcome: Progressing   Problem: Skin Integrity: Goal: Risk for impaired skin integrity will decrease 02/15/2024 1440 by Jenel Bobetta SAILOR, RN Outcome: Progressing 02/15/2024 1237 by Jenel Bobetta SAILOR, RN Outcome: Progressing   Problem: Activity: Goal: Ability to tolerate increased activity will improve 02/15/2024 1440 by Jenel Bobetta SAILOR, RN Outcome: Progressing 02/15/2024 1237 by Jenel Bobetta SAILOR, RN Outcome: Progressing   Problem: Clinical Measurements: Goal: Ability to maintain a body temperature in the normal range will improve 02/15/2024 1440 by Jenel Bobetta SAILOR, RN Outcome: Progressing 02/15/2024 1237 by Jenel Bobetta SAILOR, RN Outcome: Progressing   Problem: Respiratory: Goal: Ability to maintain adequate ventilation  will improve 02/15/2024 1440 by Jenel Bobetta SAILOR, RN Outcome: Progressing 02/15/2024 1237 by Jenel Bobetta SAILOR, RN Outcome: Progressing Goal: Ability to maintain a clear airway will improve 02/15/2024 1440 by Jenel Bobetta SAILOR, RN Outcome: Progressing 02/15/2024 1237 by Jenel Bobetta SAILOR, RN Outcome: Progressing   Problem: Education: Goal: Knowledge of disease or condition will improve 02/15/2024 1440 by Jenel Bobetta SAILOR, RN Outcome: Progressing 02/15/2024 1237 by Jenel Bobetta SAILOR, RN Outcome: Progressing Goal: Knowledge of secondary prevention will improve (MUST DOCUMENT ALL) 02/15/2024 1440 by Jenel Bobetta SAILOR, RN Outcome: Progressing 02/15/2024 1237 by Jenel Bobetta SAILOR, RN Outcome: Progressing Goal: Knowledge of patient specific risk factors will improve (DELETE if not current risk factor) 02/15/2024 1440 by Jenel Bobetta SAILOR, RN Outcome: Progressing 02/15/2024 1237 by Jenel Bobetta SAILOR, RN Outcome: Progressing   Problem: Ischemic Stroke/TIA Tissue Perfusion: Goal: Complications of ischemic stroke/TIA will be minimized 02/15/2024 1440 by Jenel Bobetta SAILOR, RN Outcome: Progressing 02/15/2024 1237 by Jenel Bobetta SAILOR, RN Outcome: Progressing   Problem: Coping: Goal: Will verbalize positive feelings about self 02/15/2024 1440 by Jenel Bobetta SAILOR, RN Outcome: Progressing 02/15/2024 1237 by Jenel Bobetta SAILOR, RN Outcome: Progressing Goal: Will identify appropriate support needs 02/15/2024 1440 by Jenel Bobetta SAILOR, RN Outcome: Progressing 02/15/2024 1237 by Jenel Bobetta SAILOR, RN Outcome: Progressing   Problem: Health Behavior/Discharge Planning: Goal: Ability to manage health-related needs will improve 02/15/2024 1440 by Jenel Bobetta SAILOR, RN Outcome: Progressing 02/15/2024 1237 by Jenel Bobetta SAILOR, RN Outcome: Progressing Goal: Goals will be collaboratively established with patient/family 02/15/2024 1440 by Jenel Bobetta SAILOR, RN Outcome: Progressing 02/15/2024 1237 by Jenel Bobetta SAILOR, RN Outcome: Progressing   Problem:  Self-Care: Goal: Ability to participate in self-care as condition permits will improve 02/15/2024 1440 by Jenel Bobetta SAILOR, RN Outcome: Progressing 02/15/2024 1237 by Jenel Bobetta SAILOR, RN Outcome: Progressing Goal: Verbalization of feelings and concerns over difficulty with self-care will improve 02/15/2024 1440 by Jenel Bobetta SAILOR, RN Outcome: Progressing 02/15/2024 1237 by Jenel Bobetta SAILOR, RN  Outcome: Progressing Goal: Ability to communicate needs accurately will improve 02/15/2024 1440 by Jenel Bobetta SAILOR, RN Outcome: Progressing 02/15/2024 1237 by Jenel Bobetta SAILOR, RN Outcome: Progressing   Problem: Nutrition: Goal: Risk of aspiration will decrease 02/15/2024 1440 by Jenel Bobetta SAILOR, RN Outcome: Progressing 02/15/2024 1237 by Jenel Bobetta SAILOR, RN Outcome: Progressing Goal: Dietary intake will improve 02/15/2024 1440 by Jenel Bobetta SAILOR, RN Outcome: Progressing 02/15/2024 1237 by Jenel Bobetta SAILOR, RN Outcome: Progressing

## 2024-02-16 DIAGNOSIS — I5033 Acute on chronic diastolic (congestive) heart failure: Secondary | ICD-10-CM | POA: Diagnosis not present

## 2024-02-16 DIAGNOSIS — R1312 Dysphagia, oropharyngeal phase: Secondary | ICD-10-CM

## 2024-02-16 DIAGNOSIS — R531 Weakness: Secondary | ICD-10-CM | POA: Diagnosis not present

## 2024-02-16 DIAGNOSIS — I639 Cerebral infarction, unspecified: Secondary | ICD-10-CM | POA: Diagnosis not present

## 2024-02-16 LAB — GLUCOSE, CAPILLARY
Glucose-Capillary: 345 mg/dL — ABNORMAL HIGH (ref 70–99)
Glucose-Capillary: 357 mg/dL — ABNORMAL HIGH (ref 70–99)
Glucose-Capillary: 355 mg/dL — ABNORMAL HIGH (ref 70–99)
Glucose-Capillary: 360 mg/dL — ABNORMAL HIGH (ref 70–99)

## 2024-02-16 MED ORDER — INSULIN GLARGINE-YFGN 100 UNIT/ML ~~LOC~~ SOLN
10.0000 [IU] | SUBCUTANEOUS | Status: DC
  Administered 2024-02-16 – 2024-02-17 (×2): 10 [IU] via SUBCUTANEOUS
  Filled 2024-02-16 (×2): qty 0.1

## 2024-02-16 NOTE — Progress Notes (Signed)
 PROGRESS NOTE  Christina Bennett FMW:[id number] DOB: [birth year] DOA: 02/02/2024 PCP: Ransom Other, MD   LOS: 13 days   Brief Narrative / Interim history: 83 y.o. F with COPD not on home O2, obesity, CAD, CKD IIIa baseline 0.8-1.1 who presented with dizziness, blurry vision, HA, and decrased appetite. CXR in the ER showed bilateral opacities, so she was admitted for pneumonia and possible dCHF flare. After admission, she developed acute left sided weakness overnight, CODE STROKE called, given TNK and transferred to ICU.  Subjective / 24h Interval events: She is feeling well today, no chest pain, no shortness of breath.  Assesement and Plan: Principal Problem:   Acute on chronic diastolic CHF (congestive heart failure) (HCC) Active Problems:   AKI (acute kidney injury) (HCC)   Dysphagia   Iron deficiency anemia   Non-insulin  dependent type 2 diabetes mellitus (HCC)   COPD (chronic obstructive pulmonary disease) (HCC)   Acute metabolic encephalopathy   History of CAD (coronary artery disease)   Chronic kidney disease (CKD), stage IIIa (moderate) (HCC)   Essential hypertension   CVA (cerebral vascular accident) (HCC)   Acute respiratory failure with hypoxia (HCC)   Morbid obesity (HCC)   Malnutrition of moderate degree   Principal problem Acute on chronic diastolic CHF -patient admitted to the hospital with fluid overload, received Lasix , this has been discontinued given elevation in the creatinine.  Volume status stable, euvolemic  Active problems AKI on CKD IIIa  - Cr up to 1.8 on 7/1 from baseline 0.7-0.9, possibly in the setting of diuresis, losartan .  Received fluids, creatinine is normal  Chest pain-on 7/4, new onset.  EKG nonischemic, troponins flat and negative.  Resolved, ? GI in origin given associated belching  CVA (cerebral vascular accident) (HCC) - Overnight after admission, developed acute left sided weakness and dysarthria. MRI brain showed small pontine infarct.  Post-stroke course complicated by dysphagia. Non-invasive angiography showed focal severe right P2 stenosis and moderate vertebral right stenosis. Echocardiogram showed no cardiogenic source of embolism. Carotid imaging unremarkable. Continue Lipitor, Plavix , she is allergic to aspirin.  PT recommends SNF, placement pending  Dysphagia - Has a history of esophageal stricture.  Since her stroke, she has had marked oropharyngeal dysphagia. She is agreeable to a PEG tube but unlikely to happen over the long weekend -SLP consulted and following.  Underwent a core track placement 7/3, now on tube feeds. -hold Plavix  in preparation for PEG tube on Friday    Acute respiratory failure with hypoxia (HCC) - Due to CHF, as above.  Wean off to room air as tolerated   Pneumonia ruled out - Admitted on antibiotics.  No fever, no leukocytosis.  Antibiotics stopped and patient without new fever or leukocytosis or sputum.  Doubt infection.   Headache- This was her initial presenting complaint.  Now resolved   Essential hypertension -blood pressure acceptable.  Continue losartan    History of CAD (coronary artery disease) - Aspirin allergic. Continue Lipitor, Plavix    Acute metabolic encephalopathy - At baseline has no cognitive impairment, lives independently.  Here she has had waxing and waning decreased mentation.  Likely due to stroke.    COPD (chronic obstructive pulmonary disease) (HCC) - No active flare, continue home inhaler, no wheezing   Non-insulin  dependent type 2 diabetes mellitus, with hyperglycemia- Uncontrolled.  A1c 9.2%.  Has been more hyperglycemic recently, add long-acting glargine this morning   Iron deficiency anemia - Hgb stable   Class 1 obesity - BMI 34, complicates care  Scheduled Meds:  artificial tears  1 drop Left Eye Daily   atorvastatin   40 mg Oral Daily   budesonide -glycopyrrolate -formoterol   2 puff Inhalation BID   Chlorhexidine  Gluconate Cloth  6 each Topical Daily    enoxaparin  (LOVENOX ) injection  40 mg Subcutaneous Q24H   feeding supplement  237 mL Oral BID BM   feeding supplement (PROSource TF20)  60 mL Per Tube Daily   free water   50 mL Per Tube Q4H   Gerhardt's butt cream   Topical TID   insulin  aspart  0-5 Units Subcutaneous QHS   insulin  aspart  0-9 Units Subcutaneous TID WC   insulin  glargine-yfgn  10 Units Subcutaneous Q24H   losartan   50 mg Oral Daily   montelukast   10 mg Oral QPM   pantoprazole   40 mg Oral Daily   polyethylene glycol  17 g Oral Daily   prednisoLONE  acetate  1 drop Left Eye Q1200   senna-docusate  1 tablet Oral Daily   sodium chloride  flush  3 mL Intravenous Q12H   sodium chloride  flush  3-10 mL Intravenous Q12H   valACYclovir   1,000 mg Oral Daily   Continuous Infusions:  feeding supplement (OSMOLITE 1.5 CAL) 1,000 mL (02/14/24 2000)   PRN Meds:.acetaminophen , bisacodyl , calcium  carbonate, dextrose , diphenhydrAMINE , haloperidol  lactate, hydrALAZINE , HYDROcodone -acetaminophen , ipratropium-albuterol , labetalol , melatonin, ondansetron  **OR** ondansetron  (ZOFRAN ) IV, mouth rinse, phenol, polyethylene glycol, sodium chloride , sodium chloride  flush, sodium chloride  flush  Current Outpatient Medications  Medication Instructions   albuterol  (PROVENTIL  HFA;VENTOLIN  HFA) 108 (90 BASE) MCG/ACT inhaler 2 puffs, Every 4 hours PRN   BREZTRI  AEROSPHERE 160-9-4.8 MCG/ACT AERO 2 puffs, 2 times daily   fluticasone (FLONASE) 50 MCG/ACT nasal spray 2 sprays, Every evening   glimepiride (AMARYL) 2 mg, 2 times daily   HYDROcodone -acetaminophen  (NORCO) 7.5-325 MG tablet 1 tablet, 2 times daily PRN   losartan  (COZAAR ) 100 mg, Daily   montelukast  (SINGULAIR ) 10 mg, Every evening   pantoprazole  (PROTONIX ) 40 mg, Daily   polyvinyl alcohol  (LIQUIFILM TEARS) 1.4 % ophthalmic solution 1 drop, Daily   prednisoLONE  acetate (PRED FORTE ) 1 % ophthalmic suspension 1 drop, Daily   simvastatin  (ZOCOR ) 40 mg, Every evening   valACYclovir  (VALTREX ) 1,000  mg, Daily   Vitamin D (Ergocalciferol) (DRISDOL) 50,000 Units, Every 14 days    Diet Orders (From admission, onward)     Start     Ordered   02/11/24 1616  DIET - DYS 1 Room service appropriate? Yes; Fluid consistency: Honey Thick  Diet effective now       Question Answer Comment  Room service appropriate? Yes   Fluid consistency: Honey Thick      02/11/24 1615            DVT prophylaxis: enoxaparin  (LOVENOX ) injection 40 mg Start: 02/11/24 2200 SCD's Start: 02/05/24 0218 SCDs Start: 02/03/24 0132 Place TED hose Start: 02/03/24 0132   Lab Results  Component Value Date   PLT 173 02/14/2024      Code Status: Limited: Do not attempt resuscitation (DNR) -DNR-LIMITED -Do Not Intubate/DNI   Family Communication: Niece over the phone  Status is: Inpatient Remains inpatient appropriate because: severity of illness  Level of care: Med-Surg  Consultants:  Palliative Neurology   Objective: Vitals:   02/16/24 0414 02/16/24 0535 02/16/24 0812 02/16/24 0952  BP: 129/65  (!) (P) 152/64 (!) 152/64  Pulse: 70  (P) 74   Resp: 16  (P) 16   Temp: (!) 97.5 F (36.4 C)  (P) 97.7 F (36.5 C)   TempSrc: Oral  (  P) Oral   SpO2: 96%  (P) 97%   Weight:  85.4 kg    Height:        Intake/Output Summary (Last 24 hours) at 02/16/2024 1126 Last data filed at 02/16/2024 0400 Gross per 24 hour  Intake 2650.75 ml  Output 500 ml  Net 2150.75 ml   Wt Readings from Last 3 Encounters:  02/16/24 85.4 kg  09/19/22 89.6 kg  03/20/22 86.4 kg    Examination:  Constitutional: NAD Eyes: lids and conjunctivae normal, no scleral icterus ENMT: mmm Neck: normal, supple Respiratory: clear to auscultation bilaterally, no wheezing, no crackles. Normal respiratory effort.  Cardiovascular: Regular rate and rhythm, no murmurs / rubs / gallops. No LE edema. Abdomen: soft, no distention, no tenderness. Bowel sounds positive.   Data Reviewed: I have independently reviewed following labs and imaging  studies   CBC Recent Labs  Lab 02/10/24 0450 02/11/24 0452 02/14/24 0440  WBC 12.5* 10.0 10.3  HGB 11.8* 11.3* 11.7*  HCT 40.5 40.0 39.6  PLT 196 185 173  MCV 89.0 87.5 88.2  MCH 25.9* 24.7* 26.1  MCHC 29.1* 28.3* 29.5*  RDW 17.2* 17.2* 17.5*   Recent Labs  Lab 02/10/24 0450 02/11/24 0452 02/12/24 1243 02/13/24 0340 02/14/24 0440 02/15/24 0337  NA 145 144  --  148* 147* 146*  K 4.0 3.6  --  3.5 3.7 3.7  CL 107 112*  --  115* 114* 105  CO2 31 23  --  27 27 32  GLUCOSE 124* 120*  --  188* 248* 316*  BUN 36* 32*  --  31* 33* 41*  CREATININE 1.88* 1.31*  --  0.71 0.91 1.11*  CALCIUM  8.9 8.1*  --  8.9 9.0 9.1  AST 27  --   --   --   --   --   ALT 11  --   --   --   --   --   ALKPHOS 70  --   --   --   --   --   BILITOT 0.6  --   --   --   --   --   ALBUMIN 2.9*  --   --   --   --   --   MG  --   --  2.1 2.1 2.0 1.9    ------------------------------------------------------------------------------------------------------------------ No results for input(s): CHOL, HDL, LDLCALC, TRIG, CHOLHDL, LDLDIRECT in the last 72 hours.  Lab Results  Component Value Date   HGBA1C 9.2 (H) 02/03/2024   ------------------------------------------------------------------------------------------------------------------ No results for input(s): TSH, T4TOTAL, T3FREE, THYROIDAB in the last 72 hours.  Invalid input(s): FREET3  Cardiac Enzymes No results for input(s): CKMB, TROPONINI, MYOGLOBIN in the last 168 hours.  Invalid input(s): CK ------------------------------------------------------------------------------------------------------------------    Component Value Date/Time   BNP 43.6 02/04/2024 0312   CBG: Recent Labs  Lab 02/15/24 0613 02/15/24 1124 02/15/24 1631 02/15/24 2135 02/16/24 0615  GLUCAP 321* 307* 315* 351* 345*    No results found for this or any previous visit (from the past 240 hours).    Radiology Studies: No results  found.  Nilda Fendt, MD, PhD [facility]  Between 7 am - 7 pm I am available, please contact me via Amion (for emergencies) or Securechat (non urgent messages)  Between 7 pm - 7 am I am not available, please contact night coverage MD/APP via Amion

## 2024-02-16 NOTE — Progress Notes (Signed)
 Speech Language Pathology Treatment: Dysphagia;Cognitive-Linguistic  Patient Details Name: Christina Bennett MRN: [id number] DOB: 09-27-1940 Today's Date: 02/16/2024 Time: 8872-8848 SLP Time Calculation (min) (ACUTE ONLY): 24 min  Assessment / Plan / Recommendation Clinical Impression  SWALLOWING Pt with melted ice chips and soda in room.  Removed thin liquids.  RN did not bring in and is aware that pt is on modified diet. Today pt with wet vocal quality and immediate cough with 1 of 4 trials of honey thick liquid.  Pt tolerated puree without any overt s/s of aspiration. There was some oral residue after swallow with puree and HTL.  Pt declined trials of solid textures. Complains of odynophagia 2/2 feeding tube. There was mild anterior loss of HTL from L side of oral cavity.  Reviewed diet recommendations and rationale for modified liquid consistencies.  Pt will likely need reinforcement.  Will consider repeat instrumental assessment later this week prior to planned PEG placement.  Recommend puree diet with honey thick liquids.   COGNITIVE COMMUNICATION Pt with adequate verbal expression and comprehension.  Poor attention and memory.  Pt unable to recall diet recommendations from earlier in session.  Pt is not oriented to situation.  Required frequent redirection. Pt easily distracted by personal needs. She was perseverating on wanting to move and had poor participation in today's session.  SLP will continue to follow.  Suspect pt will need 24/7 supervision.    HPI HPI: Patient is an 83 y.o. female with PMH: CAD, COPD, essential HTN, CKD stage III, morbid obesity. She presented to the Ed on 02/03/24 with multiple complaints inclucding generalized weakeness, HA, poor appetite, chest congestion, blurry vision and family reporting change in her speech. ED evaluation found her to be hemodynamically stable but with BP up to 183/86, UA showed evidence of UTI. CT head negative for acute intracranial  abnormality or hemorrhage but did show moderate periventricular WM changes likely reflecting the sequela of small vessel ischemia. CXR showed diffuse interstitial opacities, R>L. While in ED, patient became very restless, anxious and fidgety. Despite multiple doses of Ativan  to attempt MRI,, it was canceled due to patient not cooperating. Non-violent restraints placed. SLP swallow evalauation ordered secondary to family reports of swallowing difficulities as well as RN observing patient to not be able to swallow even smaller pills, and coughing with liquids.  MBSS 6/25 with recs for D3/NTL. Pt with code stroke overnight and reassessed 6/26 with change in swallow function.      SLP Plan  Continue with current plan of care          Recommendations  Diet recommendations: Dysphagia 1 (puree);Honey-thick liquid Liquids provided via: Teaspoon;Cup;No straw Medication Administration: Crushed with puree (or via alternate means) Supervision: Trained caregiver to feed patient Compensations: Slow rate;Small sips/bites;Minimize environmental distractions;Monitor for anterior loss Postural Changes and/or Swallow Maneuvers: Seated upright 90 degrees;Upright 30-60 min after meal                  Oral care BID   Frequent or constant Supervision/Assistance Dysphagia, oropharyngeal phase (R13.12)     Continue with current plan of care     Anette FORBES Grippe, MA, CCC-SLP Acute Rehabilitation Services Office: [phone] 02/16/2024, 11:57 AM

## 2024-02-16 NOTE — Plan of Care (Signed)
  Problem: Education: Goal: Ability to describe self-care measures that may prevent or decrease complications (Diabetes Survival Skills Education) will improve Outcome: Progressing Goal: Individualized Educational Video(s) Outcome: Progressing   Problem: Coping: Goal: Ability to adjust to condition or change in health will improve Outcome: Progressing   Problem: Fluid Volume: Goal: Ability to maintain a balanced intake and output will improve Outcome: Progressing   Problem: Health Behavior/Discharge Planning: Goal: Ability to identify and utilize available resources and services will improve Outcome: Progressing Goal: Ability to manage health-related needs will improve Outcome: Progressing   Problem: Metabolic: Goal: Ability to maintain appropriate glucose levels will improve Outcome: Progressing   Problem: Nutritional: Goal: Maintenance of adequate nutrition will improve Outcome: Progressing Goal: Progress toward achieving an optimal weight will improve Outcome: Progressing   Problem: Skin Integrity: Goal: Risk for impaired skin integrity will decrease Outcome: Progressing   Problem: Tissue Perfusion: Goal: Adequacy of tissue perfusion will improve Outcome: Progressing   Problem: Education: Goal: Knowledge of General Education information will improve Description: Including pain rating scale, medication(s)/side effects and non-pharmacologic comfort measures Outcome: Progressing   Problem: Health Behavior/Discharge Planning: Goal: Ability to manage health-related needs will improve Outcome: Progressing   Problem: Clinical Measurements: Goal: Ability to maintain clinical measurements within normal limits will improve Outcome: Progressing Goal: Will remain free from infection Outcome: Progressing Goal: Diagnostic test results will improve Outcome: Progressing Goal: Respiratory complications will improve Outcome: Progressing Goal: Cardiovascular complication will  be avoided Outcome: Progressing   Problem: Activity: Goal: Risk for activity intolerance will decrease Outcome: Progressing   Problem: Nutrition: Goal: Adequate nutrition will be maintained Outcome: Progressing   Problem: Coping: Goal: Level of anxiety will decrease Outcome: Progressing   Problem: Elimination: Goal: Will not experience complications related to bowel motility Outcome: Progressing Goal: Will not experience complications related to urinary retention Outcome: Progressing   Problem: Pain Managment: Goal: General experience of comfort will improve and/or be controlled Outcome: Progressing   Problem: Safety: Goal: Ability to remain free from injury will improve Outcome: Progressing   Problem: Skin Integrity: Goal: Risk for impaired skin integrity will decrease Outcome: Progressing   Problem: Activity: Goal: Ability to tolerate increased activity will improve Outcome: Progressing   Problem: Clinical Measurements: Goal: Ability to maintain a body temperature in the normal range will improve Outcome: Progressing   Problem: Respiratory: Goal: Ability to maintain adequate ventilation will improve Outcome: Progressing Goal: Ability to maintain a clear airway will improve Outcome: Progressing   Problem: Education: Goal: Knowledge of disease or condition will improve Outcome: Progressing Goal: Knowledge of secondary prevention will improve (MUST DOCUMENT ALL) Outcome: Progressing Goal: Knowledge of patient specific risk factors will improve (DELETE if not current risk factor) Outcome: Progressing   Problem: Ischemic Stroke/TIA Tissue Perfusion: Goal: Complications of ischemic stroke/TIA will be minimized Outcome: Progressing   Problem: Coping: Goal: Will verbalize positive feelings about self Outcome: Progressing Goal: Will identify appropriate support needs Outcome: Progressing   Problem: Health Behavior/Discharge Planning: Goal: Ability to manage  health-related needs will improve Outcome: Progressing Goal: Goals will be collaboratively established with patient/family Outcome: Progressing   Problem: Self-Care: Goal: Ability to participate in self-care as condition permits will improve Outcome: Progressing Goal: Verbalization of feelings and concerns over difficulty with self-care will improve Outcome: Progressing Goal: Ability to communicate needs accurately will improve Outcome: Progressing   Problem: Nutrition: Goal: Risk of aspiration will decrease Outcome: Progressing Goal: Dietary intake will improve Outcome: Progressing

## 2024-02-16 NOTE — Progress Notes (Signed)
 Patient ID: Christina Bennett, female   DOB: Dec 26, 1940, 83 y.o.   MRN: [id number]    Progress Note from the Palliative Medicine Team at [facility]   Patient Name: Christina Bennett        Date: 02/16/2024 DOB: 1940/11/17  Age: 83 y.o. MRN#: [id number] Attending Physician: Trixie Nilda HERO, MD Primary Care Physician: Ransom Other, MD Admit Date: 02/02/2024   Reason for Consultation/Follow-up   Establishing Goals of Care   HPI/ Brief Hospital Review 83 y.o. F with COPD not on home O2, obesity, CAD, CKD IIIa baseline 0.8-1.1 who presented with dizziness, blurry vision, HA, and decrased appetite. CXR in the ER showed bilateral opacities, so she was admitted for pneumonia and possible dCHF flare. After admission, she developed acute left sided weakness overnight, CODE STROKE called, given TNK and transferred to ICU.    Patient with the support of her family faces treatment option decisions, advanced directive decisions and anticipatory care needs.  Subjective  Extensive chart review has been completed prior to meeting with patient/family  including labs, vital signs, imaging, progress/consult notes, orders, medications and available advance directive documents.    This NP assessed patient at the bedside as a follow up for palliative medicine needs and emotional support and to further explore patient's treatment options decisions, advanced care decisions, and anticipatory care needs.    Patient is alert and oriented and verbalizes an understanding that she is here in the hospital secondary to a stroke.  We discussed with importance of her participation in her daily activities ( mobility and oral intake)  in order to promote improvement and recovery.    Education offered on the likely trajectory into the near future for [name] as it relates to need for skilled nursing facility for rehabilitation and possibly long-term care.  Education offered on the risks and benefits of artificial  feeding as it relates to core track, PEG tube versus comfort feeds as tolerated.   Patient clearly verbalizes her desire for PEG tube and rehabilitation I want to try   Plan of Care: - DNR/DNI - Patient is accepting of core track today and placement of PEG tube next week as discussed with Dr. Trixie and myself. -open to all offered and available medical interventions to prolong life -PMT will continued to support holistically  Education offered today regarding  the importance of continued conversation with family and their  medical providers regarding overall plan of care and treatment options,  ensuring decisions are within the context of the patients values and GOCs.  Questions and concerns addressed   Discussed with primary team and nursing staff   Time: 50   minutes  Detailed review of medical records ( labs, imaging, vital signs), medically appropriate exam ( MS, skin, cardiac,  resp)   discussed with treatment team, counseling and education to patient, family, staff, documenting clinical information, medication management, coordination of care    Ronal Plants NP  Palliative Medicine Team Team Phone # [phone] Pager [phone]

## 2024-02-16 NOTE — Inpatient Diabetes Management (Signed)
 Inpatient Diabetes Program Recommendations  AACE/ADA: New Consensus Statement on Inpatient Glycemic Control (2015)  Target Ranges:  Prepandial:   less than 140 mg/dL      Peak postprandial:   less than 180 mg/dL (1-2 hours)      Critically ill patients:  140 - 180 mg/dL   Lab Results  Component Value Date   GLUCAP 345 (H) 02/16/2024   HGBA1C 9.2 (H) 02/03/2024    Review of Glycemic Control  Diabetes history: DM2 Outpatient Diabetes medications: Amaryl 3 mg daily, Farxiga 10 mg QD Current orders for Inpatient glycemic control: Semglee  10 units Q 24H, Novolog  0-9 units TID with meals and 0-5 HS  HgbA1C - 9.2% Just started Semglee  this am.  Inpatient Diabetes Program Recommendations:    Consider adding Novolog  2-3 units Q4H for TF coverage  Titrate Semglee  if FBS > 180 mg/dL  Continue to follow.  Thank you. Shona Brandy, RD, LDN, CDCES Inpatient Diabetes Coordinator [phone]

## 2024-02-17 DIAGNOSIS — I5033 Acute on chronic diastolic (congestive) heart failure: Secondary | ICD-10-CM | POA: Diagnosis not present

## 2024-02-17 LAB — MAGNESIUM: Magnesium: 2 mg/dL (ref 1.7–2.4)

## 2024-02-17 LAB — GLUCOSE, CAPILLARY
Glucose-Capillary: 397 mg/dL — ABNORMAL HIGH (ref 70–99)
Glucose-Capillary: 323 mg/dL — ABNORMAL HIGH (ref 70–99)
Glucose-Capillary: 238 mg/dL — ABNORMAL HIGH (ref 70–99)
Glucose-Capillary: 243 mg/dL — ABNORMAL HIGH (ref 70–99)

## 2024-02-17 LAB — COMPREHENSIVE METABOLIC PANEL WITH GFR
ALT: 10 U/L (ref 0–44)
AST: 12 U/L — ABNORMAL LOW (ref 15–41)
Albumin: 2.9 g/dL — ABNORMAL LOW (ref 3.5–5.0)
Alkaline Phosphatase: 80 U/L (ref 38–126)
Anion gap: 9 (ref 5–15)
BUN: 44 mg/dL — ABNORMAL HIGH (ref 8–23)
CO2: 32 mmol/L (ref 22–32)
Calcium: 9 mg/dL (ref 8.9–10.3)
Chloride: 104 mmol/L (ref 98–111)
Creatinine, Ser: 1.02 mg/dL — ABNORMAL HIGH (ref 0.44–1.00)
GFR, Estimated: 55 mL/min — ABNORMAL LOW (ref >60)
Glucose, Bld: 388 mg/dL — ABNORMAL HIGH (ref 70–99)
Potassium: 4.3 mmol/L (ref 3.5–5.1)
Sodium: 145 mmol/L (ref 135–145)
Total Bilirubin: 0.6 mg/dL (ref 0.0–1.2)
Total Protein: 6.1 g/dL — ABNORMAL LOW (ref 6.5–8.1)

## 2024-02-17 LAB — CBC
HCT: 42.5 % (ref 36.0–46.0)
Hemoglobin: 12.1 g/dL (ref 12.0–15.0)
MCH: 25.3 pg — ABNORMAL LOW (ref 26.0–34.0)
MCHC: 28.5 g/dL — ABNORMAL LOW (ref 30.0–36.0)
MCV: 88.9 fL (ref 80.0–100.0)
Platelets: 183 K/uL (ref 150–400)
RBC: 4.78 MIL/uL (ref 3.87–5.11)
RDW: 17.6 % — ABNORMAL HIGH (ref 11.5–15.5)
WBC: 12.2 K/uL — ABNORMAL HIGH (ref 4.0–10.5)
nRBC: 0 % (ref 0.0–0.2)

## 2024-02-17 MED ORDER — INSULIN GLARGINE-YFGN 100 UNIT/ML ~~LOC~~ SOLN
10.0000 [IU] | Freq: Once | SUBCUTANEOUS | Status: AC
  Administered 2024-02-17: 10 [IU] via SUBCUTANEOUS
  Filled 2024-02-17: qty 0.1

## 2024-02-17 MED ORDER — GLUCERNA 1.5 CAL PO LIQD
1000.0000 mL | ORAL | Status: DC
  Administered 2024-02-17 – 2024-02-25 (×8): 1000 mL
  Filled 2024-02-17 (×13): qty 1000

## 2024-02-17 MED ORDER — INSULIN GLARGINE-YFGN 100 UNIT/ML ~~LOC~~ SOLN
20.0000 [IU] | SUBCUTANEOUS | Status: DC
  Administered 2024-02-18 – 2024-02-24 (×7): 20 [IU] via SUBCUTANEOUS
  Filled 2024-02-17 (×7): qty 0.2

## 2024-02-17 MED ORDER — INSULIN GLARGINE-YFGN 100 UNIT/ML ~~LOC~~ SOPN: 10.0000 [IU] | PEN_INJECTOR | Freq: Once | SUBCUTANEOUS | Status: DC

## 2024-02-17 MED ORDER — DIPHENHYDRAMINE HCL 12.5 MG/5ML PO ELIX
50.0000 mg | ORAL_SOLUTION | Freq: Four times a day (QID) | ORAL | Status: DC | PRN
  Administered 2024-02-17 – 2024-02-18 (×2): 50 mg
  Filled 2024-02-17 (×3): qty 20

## 2024-02-17 MED ORDER — FREE WATER
100.0000 mL | Status: DC
  Administered 2024-02-17 – 2024-02-21 (×19): 100 mL

## 2024-02-17 NOTE — Progress Notes (Signed)
 PHARMACIST - PHYSICIAN COMMUNICATION  DR:   Trixie CONCERNING: IV to Oral Route Change Policy  RECOMMENDATION: This patient is receiving diphenhydramine  by the intravenous route.  Based on criteria approved by the Pharmacy and Therapeutics Committee, intravenous diphenhydramine  is being converted to the equivalent oral dose form(s).   DESCRIPTION: These criteria include: Diphenhydramine  is not prescribed to treat or prevent a severe allergic reaction Diphenhydramine  is not prescribed as premedication prior to receiving blood product, biologic medication, antimicrobial, or chemotherapy agent The patient has tolerated at least one dose of an oral or enteral medication The patient has no evidence of active gastrointestinal bleeding or impaired GI absorption (gastrectomy, short bowel, patient on TNA or NPO). The patient is not undergoing procedural sedation   If you have questions about this conversion, please contact the Pharmacy Department  []   [phone] )  [facility] []   [phone] )  [facility] [x]   [phone] )  [facility] []   [phone] )  [facility] []   [phone] )  [facility]    Rocky Slade, PharmD, BCPS 02/17/2024 2:05 PM

## 2024-02-17 NOTE — Plan of Care (Signed)
  Problem: Education: Goal: Ability to describe self-care measures that may prevent or decrease complications (Diabetes Survival Skills Education) will improve Outcome: Progressing Goal: Individualized Educational Video(s) Outcome: Progressing   Problem: Coping: Goal: Ability to adjust to condition or change in health will improve Outcome: Progressing   Problem: Fluid Volume: Goal: Ability to maintain a balanced intake and output will improve Outcome: Progressing   Problem: Health Behavior/Discharge Planning: Goal: Ability to identify and utilize available resources and services will improve Outcome: Progressing Goal: Ability to manage health-related needs will improve Outcome: Progressing   Problem: Metabolic: Goal: Ability to maintain appropriate glucose levels will improve Outcome: Progressing   Problem: Nutritional: Goal: Maintenance of adequate nutrition will improve Outcome: Progressing Goal: Progress toward achieving an optimal weight will improve Outcome: Progressing   Problem: Skin Integrity: Goal: Risk for impaired skin integrity will decrease Outcome: Progressing   Problem: Tissue Perfusion: Goal: Adequacy of tissue perfusion will improve Outcome: Progressing   Problem: Education: Goal: Knowledge of General Education information will improve Description: Including pain rating scale, medication(s)/side effects and non-pharmacologic comfort measures Outcome: Progressing   Problem: Health Behavior/Discharge Planning: Goal: Ability to manage health-related needs will improve Outcome: Progressing   Problem: Clinical Measurements: Goal: Ability to maintain clinical measurements within normal limits will improve Outcome: Progressing Goal: Will remain free from infection Outcome: Progressing Goal: Diagnostic test results will improve Outcome: Progressing Goal: Respiratory complications will improve Outcome: Progressing Goal: Cardiovascular complication will  be avoided Outcome: Progressing   Problem: Activity: Goal: Risk for activity intolerance will decrease Outcome: Progressing   Problem: Nutrition: Goal: Adequate nutrition will be maintained Outcome: Progressing   Problem: Coping: Goal: Level of anxiety will decrease Outcome: Progressing   Problem: Elimination: Goal: Will not experience complications related to bowel motility Outcome: Progressing Goal: Will not experience complications related to urinary retention Outcome: Progressing   Problem: Pain Managment: Goal: General experience of comfort will improve and/or be controlled Outcome: Progressing   Problem: Safety: Goal: Ability to remain free from injury will improve Outcome: Progressing   Problem: Skin Integrity: Goal: Risk for impaired skin integrity will decrease Outcome: Progressing   Problem: Activity: Goal: Ability to tolerate increased activity will improve Outcome: Progressing   Problem: Clinical Measurements: Goal: Ability to maintain a body temperature in the normal range will improve Outcome: Progressing   Problem: Respiratory: Goal: Ability to maintain adequate ventilation will improve Outcome: Progressing Goal: Ability to maintain a clear airway will improve Outcome: Progressing   Problem: Education: Goal: Knowledge of disease or condition will improve Outcome: Progressing Goal: Knowledge of secondary prevention will improve (MUST DOCUMENT ALL) Outcome: Progressing Goal: Knowledge of patient specific risk factors will improve (DELETE if not current risk factor) Outcome: Progressing   Problem: Ischemic Stroke/TIA Tissue Perfusion: Goal: Complications of ischemic stroke/TIA will be minimized Outcome: Progressing   Problem: Coping: Goal: Will verbalize positive feelings about self Outcome: Progressing Goal: Will identify appropriate support needs Outcome: Progressing   Problem: Health Behavior/Discharge Planning: Goal: Ability to manage  health-related needs will improve Outcome: Progressing Goal: Goals will be collaboratively established with patient/family Outcome: Progressing   Problem: Self-Care: Goal: Ability to participate in self-care as condition permits will improve Outcome: Progressing Goal: Verbalization of feelings and concerns over difficulty with self-care will improve Outcome: Progressing Goal: Ability to communicate needs accurately will improve Outcome: Progressing   Problem: Nutrition: Goal: Risk of aspiration will decrease Outcome: Progressing Goal: Dietary intake will improve Outcome: Progressing

## 2024-02-17 NOTE — Plan of Care (Signed)
  Problem: Pain Managment: Goal: General experience of comfort will improve and/or be controlled Outcome: Progressing   Problem: Safety: Goal: Ability to remain free from injury will improve Outcome: Progressing   Problem: Skin Integrity: Goal: Risk for impaired skin integrity will decrease Outcome: Progressing

## 2024-02-17 NOTE — Progress Notes (Signed)
 Physical Therapy Treatment Patient Details Name: Christina Bennett MRN: [id number] DOB: Oct 05, 1940 Today's Date: 02/17/2024   History of Present Illness The pt is an 83 yo female presenting 6/23 with AMS. Initial work up with concern for UTI, pt given antibiotics and returned to baseline. Code stroke called 1AM 6/26, pt given TNK and transferred to ICU. 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 PMH includes: COPD, DM II, DVT, HTN, arthritis.    PT Comments  Pt received in supine and agreeable to session. Pt requires increased cues for initiation and participation during session. Pt requires max-max A +2 for bed mobility and transfers due to weakness. Pt reports feeling swimmy-headed sitting EOB and demonstrates anterior lean, but is able to correct with cues. Pt reports need for BM, but demonstrates bowel incontinence during transfer to [facility]. Pt demonstrates poor standing tolerance for pericare and requires return to supine for hygiene. Pt continues to benefit from PT services to progress toward functional mobility goals.    If plan is discharge home, recommend the following: Two people to help with walking and/or transfers;Two people to help with bathing/dressing/bathroom;Assistance with cooking/housework;Direct supervision/assist for medications management;Direct supervision/assist for financial management;Assist for transportation;Help with stairs or ramp for entrance   Can travel by private vehicle     No  Equipment Recommendations  Wheelchair (measurements PT);Wheelchair cushion (measurements PT);Hoyer lift    Recommendations for Other Services       Precautions / Restrictions Precautions Precautions: Fall Precaution/Restrictions Comments: SBP <180 Restrictions Weight Bearing Restrictions Per Provider Order: No     Mobility  Bed Mobility Overal bed mobility: Needs Assistance Bed  Mobility: Supine to Sit, Sit to Supine Rolling: Min assist, Used rails   Supine to sit: Max assist, +2 for safety/equipment Sit to supine: Max assist, +2 for physical assistance   General bed mobility comments: assist for BLE advancement to EOB, trunk elevation, and scooting forward. Increased assist to return to supine    Transfers Overall transfer level: Needs assistance Equipment used: 2 person hand held assist Transfers: Sit to/from Stand, Bed to chair/wheelchair/BSC Sit to Stand: Max assist, +2 physical assistance Stand pivot transfers: Max assist, +2 safety/equipment, +2 physical assistance         General transfer comment: STS from EOB x3 with max A +2 for power up and L knee blocking. cues for upright posture and to bring hips over BOS. Stand pivot to/from [facility] with max A +2    Ambulation/Gait                   Stairs             Wheelchair Mobility     Tilt Bed    Modified Rankin (Stroke Patients Only) Modified Rankin (Stroke Patients Only) Pre-Morbid Rankin Score: No symptoms Modified Rankin: Severe disability     Balance Overall balance assessment: Needs assistance Sitting-balance support: Feet supported Sitting balance-Leahy Scale: Fair Sitting balance - Comments: sitting EOB with CGA-minA due to anterior lean   Standing balance support: Bilateral upper extremity supported, During functional activity, Reliant on assistive device for balance Standing balance-Leahy Scale: Poor Standing balance comment: reliant on therapists.                            Communication Communication Communication: Impaired Factors Affecting Communication: Reduced clarity of speech  Cognition Arousal: Alert  Behavior During Therapy: WFL for tasks assessed/performed   PT - Cognitive impairments: Sequencing, Problem solving, Initiation                         Following commands: Impaired Following commands impaired: Follows one step  commands with increased time    Cueing Cueing Techniques: Verbal cues, Tactile cues  Exercises      General Comments        Pertinent Vitals/Pain Pain Assessment Pain Assessment: Faces Pain Score: 0-No pain     PT Goals (current goals can now be found in the care plan section) Acute Rehab PT Goals Patient Stated Goal: return to living at her house PT Goal Formulation: With patient Time For Goal Achievement: 02/20/24 Progress towards PT goals: Progressing toward goals (slowly)    Frequency    Min 2X/week      PT Plan      Co-evaluation PT/OT/SLP Co-Evaluation/Treatment: Yes Reason for Co-Treatment: Complexity of the patient's impairments (multi-system involvement);For patient/therapist safety;To address functional/ADL transfers PT goals addressed during session: Mobility/safety with mobility;Balance;Strengthening/ROM        AM-PAC PT 6 Clicks Mobility   Outcome Measure  Help needed turning from your back to your side while in a flat bed without using bedrails?: A Lot Help needed moving from lying on your back to sitting on the side of a flat bed without using bedrails?: A Lot Help needed moving to and from a bed to a chair (including a wheelchair)?: Total Help needed standing up from a chair using your arms (e.g., wheelchair or bedside chair)?: Total Help needed to walk in hospital room?: Total Help needed climbing 3-5 steps with a railing? : Total 6 Click Score: 8    End of Session Equipment Utilized During Treatment: Gait belt Activity Tolerance: Patient tolerated treatment well;Patient limited by fatigue;Other (comment) (limited by bowel incontinence) Patient left: in bed;with call bell/phone within reach;with bed alarm set Nurse Communication: Mobility status PT Visit Diagnosis: Unsteadiness on feet (R26.81);Muscle weakness (generalized) (M62.81);Hemiplegia and hemiparesis Hemiplegia - Right/Left: Left Hemiplegia - dominant/non-dominant:  Non-dominant Hemiplegia - caused by: Cerebral infarction     Time: 8952-8879 PT Time Calculation (min) (ACUTE ONLY): 33 min  Charges:    $Therapeutic Activity: 8-22 mins PT General Charges $$ ACUTE PT VISIT: 1 Visit                     Darryle George, PTA Acute Rehabilitation Services Secure Chat Preferred  [phone]    Darryle George 02/17/2024, 12:20 PM

## 2024-02-17 NOTE — Progress Notes (Signed)
 Nutrition Follow-up  DOCUMENTATION CODES:  Non-severe (moderate) malnutrition in context of acute illness/injury  INTERVENTION:  Continue current diet as ordered Encourage PO intake Ensure Plus High Protein po BID, each supplement provides 350 kcal and 20 grams of protein Will need to be mixed to correct consistency  Adjust tube feeding via cortrak: Glucerna 1.5 at 45 ml/h (1080 ml per day) Flush with of free water  q4h Provides 1620 kcal, 89 gm protein, 820 ml free water  daily (TF+flush = 14100mL/d)  NUTRITION DIAGNOSIS:  Moderate Malnutrition related to poor appetite (in the context of acute illness) as evidenced by energy intake < 75% for > 7 days, mild muscle depletion, mild fat depletion. - progressing  GOAL:  Patient will meet greater than or equal to 90% of their needs - progressing  MONITOR:  PO intake, TF tolerance, Labs, Weight trends  REASON FOR ASSESSMENT:  Consult Enteral/tube feeding initiation and management  ASSESSMENT:  Pt with hx of CAD, COPD, HTN, HLD, CKD3, and DM type 2 presented to ED with AMS, poor appetite, and weakness.   6/23 - presented to ED 6/25 - MBS, DYS3, nectar liquids 6/26 - code stroke called, received TNK, CT negative, MRI showed R pontine infarct, repeat MBS: SLP recommended DYS2/honey thick 7/3 - cortrak placed  Pt working with therapy at the time of assessment. TF infusing at goal, but noted extremely high CBGs since starting TF. Will adjust formula to Glucerna and also noted that MD increased long acting insulin  this AM. DM Coordinator recommended adding TF coverage on 7/7. Will monitor for better control after changes are made.   Noted that pt is scheduled to have PEG placed on Friday 7/11. Intake remains poor and per SLP note, pt with poor attention and not able to recall diet recommendations. Remains on DYS 1 / honey thick liquids  Admit weight: 89.6 kg ? Accuracy, copied from last encounter in 2024 Current weight: 86 kg     Average Meal Intake: 6/25-6/30: 22% intake x 6 recorded meals 7/5: 50% x 1 meal  Nutritionally Relevant Medications: Scheduled Meds:  atorvastatin   40 mg Oral Daily   Ensure Plus High Protein  237 mL Oral BID BM   PROSource TF20  60 mL Per Tube Daily   free water   50 mL Per Tube Q4H   insulin  aspart  0-5 Units Subcutaneous QHS   insulin  aspart  0-9 Units Subcutaneous TID WC   [START ON 02/18/2024] insulin  glargine-yfgn  20 Units Subcutaneous Q24H   insulin  glargine-yfgn  10 Units Subcutaneous Once   pantoprazole   40 mg Oral Daily   polyethylene glycol  17 g Oral Daily   senna-docusate  1 tablet Oral Daily   valACYclovir   1,000 mg Oral Daily   Continuous Infusions:  feeding supplement (OSMOLITE 1.5 CAL) 1,000 mL (02/17/24 0637)   PRN Meds: bisacodyl , calcium  carbonate,  diphenhydrAMINE , ondansetron , polyethylene glycol  Labs Reviewed: BUN 44, creatinine 1.02 CBG ranges from 323-360 mg/dL over the last 24 hours HgbA1c 9.2%  NUTRITION - FOCUSED PHYSICAL EXAM: Flowsheet Row Most Recent Value  Orbital Region Mild depletion  Upper Arm Region No depletion  Thoracic and Lumbar Region No depletion  Buccal Region Mild depletion  Temple Region No depletion  Clavicle Bone Region No depletion  Clavicle and Acromion Bone Region Mild depletion  Scapular Bone Region No depletion  Dorsal Hand Mild depletion  Patellar Region Mild depletion  Anterior Thigh Region Mild depletion  Posterior Calf Region Mild depletion  Edema (RD Assessment) Mild  Hair Reviewed  Eyes Reviewed  Mouth Reviewed  Skin Reviewed  Nails Reviewed    Diet Order:   Diet Order             DIET - DYS 1 Room service appropriate? Yes; Fluid consistency: Honey Thick  Diet effective now                   EDUCATION NEEDS:  Education needs have been addressed  Skin:  Skin Assessment: Reviewed RN Assessment  Last BM:  7/8 - type 6  Height:  Ht Readings from Last 1 Encounters:  02/03/24 5' 2 (1.575  m)    Weight:  Wt Readings from Last 1 Encounters:  02/17/24 86 kg    Ideal Body Weight:  50 kg  BMI:  Body mass index is 34.68 kg/m.  Estimated Nutritional Needs:  Kcal:  1500-1700 kcal/d Protein:  75-90g/d Fluid:  1.5-1.7L/d    Christina Bennett, RD, LDN, CNSC Registered Dietitian II Please reach out via secure chat

## 2024-02-17 NOTE — Progress Notes (Signed)
 PROGRESS NOTE  Christina Bennett FMW:[id number] DOB: 1941/05/10 DOA: 02/02/2024 PCP: Ransom Other, MD   LOS: 14 days   Brief Narrative / Interim history: 83 y.o. F with COPD not on home O2, obesity, CAD, CKD IIIa baseline 0.8-1.1 who presented with dizziness, blurry vision, HA, and decrased appetite. CXR in the ER showed bilateral opacities, so she was admitted for pneumonia and possible dCHF flare. After admission, she developed acute left sided weakness overnight, CODE STROKE called, given TNK and transferred to ICU.  Subjective / 24h Interval events: Denies any chest pain, no shortness of breath.  She is being bothered by her feeding tube and tells me she wants a cold Pepsi  Assesement and Plan: Principal Problem:   Acute on chronic diastolic CHF (congestive heart failure) (HCC) Active Problems:   AKI (acute kidney injury) (HCC)   Dysphagia   Iron deficiency anemia   Non-insulin  dependent type 2 diabetes mellitus (HCC)   COPD (chronic obstructive pulmonary disease) (HCC)   Acute metabolic encephalopathy   History of CAD (coronary artery disease)   Chronic kidney disease (CKD), stage IIIa (moderate) (HCC)   Essential hypertension   CVA (cerebral vascular accident) (HCC)   Acute respiratory failure with hypoxia (HCC)   Morbid obesity (HCC)   Malnutrition of moderate degree   Principal problem Acute on chronic diastolic CHF -patient admitted to the hospital with fluid overload, received Lasix , this has been discontinued given elevation in the creatinine.  Volume status currently stable, she is euvolemic  Active problems AKI on CKD IIIa  - Cr up to 1.8 on 7/1 from baseline 0.7-0.9, possibly in the setting of diuresis, losartan .  Creatinine now stabilized  Chest pain-on 7/4, new onset.  EKG nonischemic, troponins flat and negative.  Resolved, ? GI in origin given associated belching  CVA (cerebral vascular accident) (HCC) - Overnight after admission, developed acute left sided  weakness and dysarthria. MRI brain showed small pontine infarct. Post-stroke course complicated by dysphagia. Non-invasive angiography showed focal severe right P2 stenosis and moderate vertebral right stenosis. Echocardiogram showed no cardiogenic source of embolism. Carotid imaging unremarkable. Continue Lipitor, Plavix , she is allergic to aspirin.  PT recommends SNF, placement pending  Dysphagia - Has a history of esophageal stricture.  Since her stroke, she has had marked oropharyngeal dysphagia. She is agreeable to a PEG tube, IR will place it this Friday.  Could go to SNF afterwards -SLP consulted and following.  Underwent a core track placement 7/3, now on tube feeds. -hold Plavix  in preparation for PEG tube on Friday    Acute respiratory failure with hypoxia (HCC) - Due to CHF, as above.  Wean off to room air as tolerated   Pneumonia ruled out - Admitted on antibiotics.  No fever, no leukocytosis.  Antibiotics stopped and patient without new fever or leukocytosis or sputum.  Doubt infection.   Headache- This was her initial presenting complaint.  Now resolved   Essential hypertension -blood pressure overall acceptable   History of CAD (coronary artery disease) - Aspirin allergic. Continue Lipitor, Plavix    Acute metabolic encephalopathy - At baseline has no cognitive impairment, lives independently.  Here she has had waxing and waning decreased mentation.  Likely due to stroke.  Currently stable, answers orientation questions appropriately   COPD (chronic obstructive pulmonary disease) (HCC) - No active flare, continue home inhaler, no wheezing   Non-insulin  dependent type 2 diabetes mellitus, with hyperglycemia- Uncontrolled.  A1c 9.2%.  Persistently hyperglycemic, will increase glargine  CBG (last 3)  Recent Labs    02/16/24 1559 02/16/24 2115 02/17/24 0625  GLUCAP 355* 360* 397*   Iron deficiency anemia - Hgb stable   Class 1 obesity - BMI 34, complicates care  Scheduled  Meds:  artificial tears  1 drop Left Eye Daily   atorvastatin   40 mg Oral Daily   budesonide -glycopyrrolate -formoterol   2 puff Inhalation BID   Chlorhexidine  Gluconate Cloth  6 each Topical Daily   enoxaparin  (LOVENOX ) injection  40 mg Subcutaneous Q24H   feeding supplement  237 mL Oral BID BM   feeding supplement (PROSource TF20)  60 mL Per Tube Daily   free water   50 mL Per Tube Q4H   Gerhardt's butt cream   Topical TID   insulin  aspart  0-5 Units Subcutaneous QHS   insulin  aspart  0-9 Units Subcutaneous TID WC   insulin  glargine-yfgn  10 Units Subcutaneous Q24H   losartan   50 mg Oral Daily   montelukast   10 mg Oral QPM   pantoprazole   40 mg Oral Daily   polyethylene glycol  17 g Oral Daily   prednisoLONE  acetate  1 drop Left Eye Q1200   senna-docusate  1 tablet Oral Daily   sodium chloride  flush  3 mL Intravenous Q12H   sodium chloride  flush  3-10 mL Intravenous Q12H   valACYclovir   1,000 mg Oral Daily   Continuous Infusions:  feeding supplement (OSMOLITE 1.5 CAL) 1,000 mL (02/17/24 0637)   PRN Meds:.acetaminophen , bisacodyl , calcium  carbonate, dextrose , diphenhydrAMINE , haloperidol  lactate, hydrALAZINE , HYDROcodone -acetaminophen , ipratropium-albuterol , labetalol , melatonin, ondansetron  **OR** ondansetron  (ZOFRAN ) IV, mouth rinse, phenol, polyethylene glycol, sodium chloride , sodium chloride  flush, sodium chloride  flush  Current Outpatient Medications  Medication Instructions   albuterol  (PROVENTIL  HFA;VENTOLIN  HFA) 108 (90 BASE) MCG/ACT inhaler 2 puffs, Every 4 hours PRN   BREZTRI  AEROSPHERE 160-9-4.8 MCG/ACT AERO 2 puffs, 2 times daily   fluticasone (FLONASE) 50 MCG/ACT nasal spray 2 sprays, Every evening   glimepiride (AMARYL) 2 mg, 2 times daily   HYDROcodone -acetaminophen  (NORCO) 7.5-325 MG tablet 1 tablet, 2 times daily PRN   losartan  (COZAAR ) 100 mg, Daily   montelukast  (SINGULAIR ) 10 mg, Every evening   pantoprazole  (PROTONIX ) 40 mg, Daily   polyvinyl alcohol   (LIQUIFILM TEARS) 1.4 % ophthalmic solution 1 drop, Daily   prednisoLONE  acetate (PRED FORTE ) 1 % ophthalmic suspension 1 drop, Daily   simvastatin  (ZOCOR ) 40 mg, Every evening   valACYclovir  (VALTREX ) 1,000 mg, Daily   Vitamin D (Ergocalciferol) (DRISDOL) 50,000 Units, Every 14 days    Diet Orders (From admission, onward)     Start     Ordered   02/11/24 1616  DIET - DYS 1 Room service appropriate? Yes; Fluid consistency: Honey Thick  Diet effective now       Question Answer Comment  Room service appropriate? Yes   Fluid consistency: Honey Thick      02/11/24 1615            DVT prophylaxis: enoxaparin  (LOVENOX ) injection 40 mg Start: 02/11/24 2200 SCD's Start: 02/05/24 0218 SCDs Start: 02/03/24 0132 Place TED hose Start: 02/03/24 0132   Lab Results  Component Value Date   PLT 183 02/17/2024      Code Status: Limited: Do not attempt resuscitation (DNR) -DNR-LIMITED -Do Not Intubate/DNI   Family Communication: Niece over the phone  Status is: Inpatient Remains inpatient appropriate because: severity of illness  Level of care: Med-Surg  Consultants:  Palliative Neurology   Objective: Vitals:   02/17/24 0005 02/17/24 0443 02/17/24 0500 02/17/24 0826  BP: ROLLEN)  145/71 (!) 109/33  (!) 157/53  Pulse: 82 81  84  Resp: 20 16    Temp: 98.1 F (36.7 C) 98.1 F (36.7 C)  98.2 F (36.8 C)  TempSrc:    Oral  SpO2: 96% 97%  96%  Weight:   86 kg   Height:        Intake/Output Summary (Last 24 hours) at 02/17/2024 1018 Last data filed at 02/17/2024 9362 Gross per 24 hour  Intake 1550.75 ml  Output --  Net 1550.75 ml   Wt Readings from Last 3 Encounters:  02/17/24 86 kg  09/19/22 89.6 kg  03/20/22 86.4 kg    Examination:  Constitutional: NAD Eyes: lids and conjunctivae normal, no scleral icterus ENMT: mmm Neck: normal, supple Respiratory: clear to auscultation bilaterally, no wheezing, no crackles. Normal respiratory effort.  Cardiovascular: Regular rate  and rhythm, no murmurs / rubs / gallops. No LE edema. Abdomen: soft, no distention, no tenderness. Bowel sounds positive.   Data Reviewed: I have independently reviewed following labs and imaging studies   CBC Recent Labs  Lab 02/11/24 0452 02/14/24 0440 02/17/24 0457  WBC 10.0 10.3 12.2*  HGB 11.3* 11.7* 12.1  HCT 40.0 39.6 42.5  PLT 185 173 183  MCV 87.5 88.2 88.9  MCH 24.7* 26.1 25.3*  MCHC 28.3* 29.5* 28.5*  RDW 17.2* 17.5* 17.6*   Recent Labs  Lab 02/11/24 0452 02/12/24 1243 02/13/24 0340 02/14/24 0440 02/15/24 0337 02/17/24 0457  NA 144  --  148* 147* 146* 145  K 3.6  --  3.5 3.7 3.7 4.3  CL 112*  --  115* 114* 105 104  CO2 23  --  27 27 32 32  GLUCOSE 120*  --  188* 248* 316* 388*  BUN 32*  --  31* 33* 41* 44*  CREATININE 1.31*  --  0.71 0.91 1.11* 1.02*  CALCIUM  8.1*  --  8.9 9.0 9.1 9.0  AST  --   --   --   --   --  12*  ALT  --   --   --   --   --  10  ALKPHOS  --   --   --   --   --  80  BILITOT  --   --   --   --   --  0.6  ALBUMIN  --   --   --   --   --  2.9*  MG  --  2.1 2.1 2.0 1.9 2.0    ------------------------------------------------------------------------------------------------------------------ No results for input(s): CHOL, HDL, LDLCALC, TRIG, CHOLHDL, LDLDIRECT in the last 72 hours.  Lab Results  Component Value Date   HGBA1C 9.2 (H) 02/03/2024   ------------------------------------------------------------------------------------------------------------------ No results for input(s): TSH, T4TOTAL, T3FREE, THYROIDAB in the last 72 hours.  Invalid input(s): FREET3  Cardiac Enzymes No results for input(s): CKMB, TROPONINI, MYOGLOBIN in the last 168 hours.  Invalid input(s): CK ------------------------------------------------------------------------------------------------------------------    Component Value Date/Time   BNP 43.6 02/04/2024 0312   CBG: Recent Labs  Lab 02/16/24 0615 02/16/24 1459  02/16/24 1559 02/16/24 2115 02/17/24 0625  GLUCAP 345* 357* 355* 360* 397*    No results found for this or any previous visit (from the past 240 hours).    Radiology Studies: No results found.  Nilda Fendt, MD, PhD [facility]  Between 7 am - 7 pm I am available, please contact me via Amion (for emergencies) or Securechat (non urgent messages)  Between 7 pm - 7  am I am not available, please contact night coverage MD/APP via Amion

## 2024-02-17 NOTE — Progress Notes (Signed)
 Occupational Therapy Treatment Patient Details Name: Christina Bennett MRN: [id number] DOB: August 05, 1941 Today's Date: 02/17/2024   History of present illness The pt is an 83 yo female presenting 6/23 with AMS. Initial work up with concern for UTI, pt given antibiotics and returned to baseline. Code stroke called 1AM 6/26, pt given TNK and transferred to ICU. 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 PMH includes: COPD, DM II, DVT, HTN, arthritis.   OT comments  Pt seen for OT/PT co-tx this date to maximize therapeutic outcomes and for pt/therapist safety. Resting hand splint removed start of session to check for skin integrity. Pt requires MAX A +1-2 for bed mobility, anterior lean once seated EOB, states need for BM but incontinent of bowel when transferring to [facility]. MAX A +2 to stand from EOB, L knee blocked due to instability and LUE protected. SPT MAX A +2 bed <> BSC, pt with poor standing tolerance but able to complete 3x stand attempts for pericare. Returned to supine for bed level pericare and UB dressing to don new gown. Discharge recommendation appropriate, OT will continue to follow. Patient will benefit from continued inpatient follow up therapy, <3 hours/day       If plan is discharge home, recommend the following:  Two people to help with walking and/or transfers;A lot of help with bathing/dressing/bathroom;Assistance with feeding;Assistance with Charity fundraiser cushion (measurements OT);Wheelchair (measurements OT);Hoyer lift       Precautions / Restrictions Precautions Precautions: Fall Recall of Precautions/Restrictions: Impaired Precaution/Restrictions Comments: SBP <180 Restrictions Weight Bearing Restrictions Per Provider Order: No       Mobility Bed Mobility   Bed Mobility: Supine to Sit, Sit to Supine Rolling: Min assist, Used  rails   Supine to sit: Max assist, +2 for safety/equipment Sit to supine: Max assist, +2 for physical assistance   General bed mobility comments: requires cues for all task initiation, cues for attn to hemiparetic L side, increased assist required to return to supine vs transition from supine to sit (anticipate fatigue contributes)    Transfers Overall transfer level: Needs assistance Equipment used: 2 person hand held assist Transfers: Sit to/from Stand, Bed to chair/wheelchair/BSC Sit to Stand: Max assist, +2 physical assistance Stand pivot transfers: Max assist, +2 safety/equipment, +2 physical assistance   Step pivot transfers: Max assist, +2 physical assistance     General transfer comment: functional STS with MAX A +2 to power up, L knee blocked due to weakness. cues for upright posture     Balance Overall balance assessment: Needs assistance Sitting-balance support: Feet supported Sitting balance-Leahy Scale: Fair Sitting balance - Comments: sitting EOB with CGA-minA due to anterior lean   Standing balance support: Bilateral upper extremity supported, During functional activity, Reliant on assistive device for balance Standing balance-Leahy Scale: Poor Standing balance comment: reliant on therapists.poor ability to stand fully upright or tolerate standing                           ADL either performed or assessed with clinical judgement   ADL Overall ADL's : Needs assistance/impaired                 Upper Body Dressing : Maximal assistance;Bed level Upper Body Dressing Details (indicate cue type and reason): requires assist to move LUE and attend to L side  Toilet Transfer: Maximal assistance;+2 for physical Product manager Details (indicate cue type and reason): incontinent of BM during transfer t/f BSC, pt globally weak, requires BLE blocking Toileting- Clothing Manipulation and Hygiene: Sit to/from stand;Total  assistance;+2 for physical assistance Toileting - Clothing Manipulation Details (indicate cue type and reason): pt able to perform STS with MAX A +2 with bilateral knee/foot block. TOTAL A for pericare after incont BM     Functional mobility during ADLs: +2 for physical assistance;+2 for safety/equipment;Maximal assistance General ADL Comments: continues to require heavy +2 assist for mobility efforts    Extremity/Trunk Assessment Upper Extremity Assessment LUE Deficits / Details: minimal attention to hemiparetic LUE; when prompted is able to minimially activate triceps / biceps with increased tone beginning. able to perform shoulder elevation and some protraction / retraction, digits resting in flexion LUE Coordination: decreased fine motor;decreased gross motor                     Communication Communication Communication: Impaired Factors Affecting Communication: Reduced clarity of speech   Cognition Arousal: Alert Behavior During Therapy: WFL for tasks assessed/performed Cognition: Cognition impaired     Awareness: Online awareness impaired   Attention impairment (select first level of impairment): Sustained attention Executive functioning impairment (select all impairments): Initiation, Organization, Sequencing, Reasoning, Problem solving                   Following commands: Impaired Following commands impaired: Follows one step commands with increased time      Cueing   Cueing Techniques: Verbal cues, Tactile cues        General Comments Redness noted on bottom.    Pertinent Vitals/ Pain       Pain Assessment Pain Assessment: Faces Faces Pain Scale: Hurts a little bit Pain Location: back, generalized Pain Descriptors / Indicators: Grimacing Pain Intervention(s): Limited activity within patient's tolerance, Repositioned   Frequency  Min 2X/week        Progress Toward Goals  OT Goals(current goals can now be found in the care plan section)   Progress towards OT goals: Progressing toward goals  Acute Rehab OT Goals OT Goal Formulation: With patient Time For Goal Achievement: 02/20/24 Potential to Achieve Goals: Fair  Plan      Co-evaluation    PT/OT/SLP Co-Evaluation/Treatment: Yes Reason for Co-Treatment: Complexity of the patient's impairments (multi-system involvement);For patient/therapist safety;To address functional/ADL transfers PT goals addressed during session: Mobility/safety with mobility;Balance;Strengthening/ROM OT goals addressed during session: ADL's and self-care;Strengthening/ROM      AM-PAC OT 6 Clicks Daily Activity     Outcome Measure   Help from another person eating meals?: A Little Help from another person taking care of personal grooming?: A Little Help from another person toileting, which includes using toliet, bedpan, or urinal?: A Lot Help from another person bathing (including washing, rinsing, drying)?: A Lot Help from another person to put on and taking off regular upper body clothing?: A Lot Help from another person to put on and taking off regular lower body clothing?: Total 6 Click Score: 13    End of Session Equipment Utilized During Treatment: Gait belt  OT Visit Diagnosis: Unsteadiness on feet (R26.81);Muscle weakness (generalized) (M62.81);Cognitive communication deficit (R41.841);Hemiplegia and hemiparesis Symptoms and signs involving cognitive functions: Cerebral infarction Hemiplegia - Right/Left: Left Hemiplegia - dominant/non-dominant: Non-Dominant Hemiplegia - caused by: Cerebral infarction   Activity Tolerance Patient tolerated treatment well   Patient Left in bed;with call bell/phone within reach;with bed alarm set  Nurse Communication Mobility status        Time: 8952-8879 OT Time Calculation (min): 33 min  Charges: OT General Charges $OT Visit: 1 Visit OT Treatments $Self Care/Home Management : 8-22 mins  Samira Acero L. Rafaelita Foister, OTR/L  02/17/24,  12:59 PM

## 2024-02-17 NOTE — TOC Progression Note (Signed)
 Transition of Care [facility]) - Progression Note    Patient Details  Name: XITLALY AULT MRN: [id number] Date of Birth: [birth year]  [facility] of [facility] CM/SW Contact  [name], [name] Phone Number: 02/17/2024, 4:11 PM  Clinical Narrative:   CSW met with niece, [name], to discuss SNF options and provide updates. CSW answered questions and discussed options available. [name] to discuss with her sister and will update CSW. CSW to follow.    Expected Discharge Plan: Skilled Nursing Facility Barriers to Discharge: Continued Medical Work up  Expected Discharge Plan and Services In-house Referral: Clinical Social Work     Living arrangements for the past 2 months: Single Family Home                                       Social Determinants of Health (SDOH) Interventions SDOH Screenings   Food Insecurity: Patient Unable To Answer (02/03/2024)  Housing: Unknown (02/03/2024)  Transportation Needs: Patient Unable To Answer (02/03/2024)  Utilities: Patient Unable To Answer (02/03/2024)  Social Connections: Patient Unable To Answer (02/03/2024)  Tobacco Use: Medium Risk (02/03/2024)    Readmission Risk Interventions     No data to display

## 2024-02-18 DIAGNOSIS — I5033 Acute on chronic diastolic (congestive) heart failure: Secondary | ICD-10-CM | POA: Diagnosis not present

## 2024-02-18 DIAGNOSIS — I639 Cerebral infarction, unspecified: Secondary | ICD-10-CM | POA: Diagnosis not present

## 2024-02-18 DIAGNOSIS — N1831 Chronic kidney disease, stage 3a: Secondary | ICD-10-CM | POA: Diagnosis not present

## 2024-02-18 DIAGNOSIS — I10 Essential (primary) hypertension: Secondary | ICD-10-CM | POA: Diagnosis not present

## 2024-02-18 DIAGNOSIS — D509 Iron deficiency anemia, unspecified: Secondary | ICD-10-CM

## 2024-02-18 DIAGNOSIS — E66811 Obesity, class 1: Secondary | ICD-10-CM

## 2024-02-18 DIAGNOSIS — E440 Moderate protein-calorie malnutrition: Secondary | ICD-10-CM

## 2024-02-18 LAB — GLUCOSE, CAPILLARY
Glucose-Capillary: 240 mg/dL — ABNORMAL HIGH (ref 70–99)
Glucose-Capillary: 298 mg/dL — ABNORMAL HIGH (ref 70–99)
Glucose-Capillary: 253 mg/dL — ABNORMAL HIGH (ref 70–99)
Glucose-Capillary: 228 mg/dL — ABNORMAL HIGH (ref 70–99)

## 2024-02-18 MED ORDER — HYDROXYZINE HCL 10 MG PO TABS
10.0000 mg | ORAL_TABLET | Freq: Three times a day (TID) | ORAL | Status: DC | PRN
  Administered 2024-02-18 – 2024-02-19 (×2): 10 mg via ORAL
  Filled 2024-02-18 (×2): qty 1

## 2024-02-18 MED ORDER — CEFAZOLIN SODIUM-DEXTROSE 2-4 GM/100ML-% IV SOLN
2.0000 g | INTRAVENOUS | Status: AC
  Administered 2024-02-20: 2 g via INTRAVENOUS
  Filled 2024-02-18 (×2): qty 100

## 2024-02-18 NOTE — Progress Notes (Addendum)
 Progress Note   Patient: Christina Bennett:[id number] DOB: [birth year] DOA: 02/02/2024     15 DOS: the patient was seen and examined on 02/18/2024   Brief hospital course: 83 y.o. F with COPD not on home O2, obesity, CAD, CKD IIIa baseline 0.8-1.1 who presented with dizziness, blurry vision, HA, and decrased appetite. CXR in the ER showed bilateral opacities, so she was admitted for pneumonia and possible dCHF flare. After admission, she developed acute left sided weakness overnight, CODE STROKE called, given TNK and transferred to ICU.   Assessment and Plan: * Acute on chronic diastolic CHF (congestive heart failure) (HCC) Echocardiogram with preserved LV systolic function with EF 55 to 60%, RV systolic function preserved, normal size left and right atriums, no significant valvular disease.   Patient has been diuresed with furosemide  Currently with euvolemic state.  Holding diuretic therapy   CVA (cerebral vascular accident) (HCC) Overnight after admission, developed acute left sided weakness and dysarthria. MRI brain showed small pontine infarct. Post-stroke course complicated by dysphagia. Non-invasive angiography showed focal severe right P2 stenosis and moderate vertebral right stenosis. Echocardiogram showed no cardiogenic source of embolism. Carotid imaging unremarkable. Continue Lipitor, Plavix , she is allergic to aspirin. PT recommends SNF, placement pending   Acute metabolic encephalopathy, multifactorial, clinically has improved, and back to baseline, encephalopathy has resolved.   CKD stage 3a, GFR 45-59 ml/min (HCC) AKI.   Renal function has improved, continue close monitoring, avoid hypotension or nephrotoxic medications.   Essential hypertension Continue blood pressure monitoring   Chronic kidney disease (CKD), stage IIIa (moderate) (HCC)    History of CAD (coronary artery disease) Continue with antiplatelet therapy   Iron deficiency anemia Hgb stable  COPD  (chronic obstructive pulmonary disease) (HCC) No acute exacerbation.  Continue with bronchodilator therapy   Type 2 diabetes mellitus with hyperlipidemia (HCC) Continue glucose cover and monitoring with insulin  sliding scale.  Hyperglycemia, on basal insulin  Continue with tube feedings.   Continue with statin therapy  Malnutrition of moderate degree Continue nutritional supplements.  Currently receiving tube feedings.   Acute respiratory failure with hypoxia (HCC)    Obesity, class 1 Calculated BMI 34.6   Dysphagia Has a history of esophageal stricture.  Since her stroke, she has had marked oropharyngeal dysphagia.         Subjective: Patient has been experiencing anxiety, no chest pain, no dyspnea, has been working with Pt   Physical Exam: Vitals:   02/18/24 0620 02/18/24 0728 02/18/24 1202 02/18/24 1630  BP: (!) 126/100 (!) 126/100 (!) 141/66 (!) 127/42  Pulse:  87 91 86  Resp:  20 20 20   Temp:  97.7 F (36.5 C) 98 F (36.7 C) 98.3 F (36.8 C)  TempSrc:  Oral Oral Oral  SpO2:  96% 97% 95%  Weight:      Height:       Neurology awake and alert, deconditioned and ill looking appearing ENT with mild pallor, cortrack in place Cardiovascular with S1 and S2 present and regular with no gallops, rubs or murmurs Respiratory with no rales or wheezing, no rhonchi  Abdomen with no distention  No lower extremity edema  Data Reviewed:    Family Communication: I spoke with patient's nice at the bedside, we talked in detail about patient's condition, plan of care and prognosis and all questions were addressed.   Disposition: Status is: Inpatient Remains inpatient appropriate because: recovering cva and transfer to SNF   Planned Discharge Destination: Skilled nursing facility     Author:  Montrelle Eddings Toribio Furnace, MD 02/18/2024 4:47 PM  For on call review [email or web address].

## 2024-02-18 NOTE — Progress Notes (Signed)
 Speech Language Pathology Treatment: Dysphagia  Patient Details Name: Christina Bennett MRN: [id number] DOB: [birth year] Today's Date: 02/18/2024 Time: 1050-1101 SLP Time Calculation (min) (ACUTE ONLY): 11 min  Assessment / Plan / Recommendation Clinical Impression  Pt seen for ongoing dysphagia management.  Per nursing and palliative care NP, pt is displeased with current diet.  Unfortunately there was gross aspiration of nectar thick liquid on most recent MBSS.  Pt with chronic dysphagia which includes hx esophageal stricture, and now with acute dysphagia following a new pontine stroke this admission.  Today pt had tray with melted ice chips in front of her in room with suction swab.  This did not appear to be mixed with juice or soda and pt reported using with swab only.  Trials of nectar thick liquid resulted in immediate, strong, wet cough.  There was intermittent delayed cough with honey thick liquids.  Introduced swallowing exercises to patient to rehabilitate swallow function.  Pt with fair-poor participation.  After one or two repetitions of effortful swallow she would state that she wasn't sitting right and have SLP adjust head and feet of bed.  She was only able to complete 7 of 10 reps before complaining of odynophagia.  Pt did complete 10 reps of CTAR.  Introduced Music therapist, but pt declined to attempt this exercise.  Discussed with patient her goals.  She states she is getting a PEG tube Friday, but that she wants to continue to drink and she does not like thickened liquids.  Provided education about aspiration and pneumonia.  If pt continues to drink thin liquids she is at a very high risk of aspiration, which may lead to pneumonia and can be fatal.  If she would like to focus on improving swallow function, recommend continuing modified liquids to protect airway while undergoing therapy.  If pt would prefer unmodified diet, recommend continuing discussions around goals of care as PEG tube  will not prevent aspiration.  Will tentatively plan to repeat swallow study next date to help inform goals of care.  Recommend continuing puree diet with honey thick liquids.      HPI HPI: Patient is an 83 y.o. female with PMH: CAD, COPD, essential HTN, CKD stage III, morbid obesity. She presented to the Ed on 02/03/24 with multiple complaints inclucding generalized weakeness, HA, poor appetite, chest congestion, blurry vision and family reporting change in her speech. ED evaluation found her to be hemodynamically stable but with BP up to 183/86, UA showed evidence of UTI. CT head negative for acute intracranial abnormality or hemorrhage but did show moderate periventricular WM changes likely reflecting the sequela of small vessel ischemia. CXR showed diffuse interstitial opacities, R>L. While in ED, patient became very restless, anxious and fidgety. Despite multiple doses of Ativan  to attempt MRI,, it was canceled due to patient not cooperating. Non-violent restraints placed. SLP swallow evalauation ordered secondary to family reports of swallowing difficulities as well as RN observing patient to not be able to swallow even smaller pills, and coughing with liquids.  MBSS 6/25 with recs for D3/NTL. Pt with code stroke overnight and reassessed 6/26 with change in swallow function.      SLP Plan  MBS          Recommendations  Diet recommendations: Dysphagia 1 (puree);Honey-thick liquid Liquids provided via: Teaspoon;Cup;No straw Medication Administration: Crushed with puree Supervision: Trained caregiver to feed patient Compensations: Slow rate;Small sips/bites;Minimize environmental distractions;Monitor for anterior loss Postural Changes and/or Swallow Maneuvers: Seated upright 90 degrees;Upright 30-60 min  after meal                  Oral care BID   Frequent or constant Supervision/Assistance Dysphagia, oropharyngeal phase (R13.12)     MBS     Anette FORBES Grippe, MA, CCC-SLP Acute  Rehabilitation Services Office: [phone] 02/18/2024, 12:03 PM

## 2024-02-18 NOTE — Progress Notes (Signed)
 Occupational Therapy Treatment Patient Details Name: Christina Bennett MRN: [id number] DOB: [birth year] Today's Date: 02/18/2024   History of present illness The pt is an 83 yo female presenting 6/23 with AMS. Initial work up with concern for UTI, pt given antibiotics and returned to baseline. Code stroke called 1AM 6/26, pt given TNK and transferred to ICU. 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 PMH includes: COPD, DM II, DVT, HTN, arthritis.   OT comments  Pt making steady progress towards OT goals this session. Pt continues to present with impaired activity tolerance, generalized deconditioning and impaired balance . Session focus on increasing overall activity tolerance for higher level ADL participation. Pt currently requires MAX A for UB ADLs and total A for LB ADLS. Worked on sit>stands in stedy with pt able to stand from EOB with MAX A +2 and seat of stedy with MODA +2. Patient will benefit from continued inpatient follow up therapy, <3 hours/day        If plan is discharge home, recommend the following:  Two people to help with walking and/or transfers;A lot of help with bathing/dressing/bathroom;Assistance with feeding;Assistance with Charity fundraiser cushion (measurements OT);Wheelchair (measurements OT);Hoyer lift    Recommendations for [employer]      Precautions / Restrictions Precautions Precautions: Fall Recall of Precautions/Restrictions: Impaired Precaution/Restrictions Comments: SBP <180 Restrictions Weight Bearing Restrictions Per Provider Order: No       Mobility Bed Mobility Overal bed mobility: Needs Assistance Bed Mobility: Supine to Sit, Sit to Supine     Supine to sit: Max assist, +2 for safety/equipment Sit to supine: Max assist, +2 for physical assistance   General bed mobility comments: pt initially able  to initiate maneuvering BLEs to EOB however pt reports I cant do anymore and requested assist to manage BLEs. pt needed assist to manage all aspects of trunk elevation/depression.    Transfers Overall transfer level: Needs assistance Equipment used: Ambulation equipment used (stedy) Transfers: Sit to/from Stand Sit to Stand: Max assist, +2 physical assistance, Mod assist, +2 safety/equipment, From elevated surface           General transfer comment: pt declined sitting in recliner but agreeable to work on sit>stands from EOB and from seat of stedy. pt required MAX A +2 for sit>stands from EOB with pt needing assist to support LUE on stedy and assist to fully extend hips and elevate trunk. pt able to stand from seat of stedy with MODA +2 Transfer via Lift Equipment: Stedy   Balance Overall balance assessment: Needs assistance Sitting-balance support: Feet supported, Single extremity supported Sitting balance-Leahy Scale: Fair Sitting balance - Comments: brief moments of CGA however pt mostly required at least MINA   Standing balance support: Bilateral upper extremity supported, Reliant on assistive device for balance Standing balance-Leahy Scale: Poor Standing balance comment: reliant on therapist and BUE support                           ADL either performed or assessed with clinical judgement   ADL Overall ADL's : Needs assistance/impaired                 Upper Body Dressing : Maximal assistance;Bed level Upper Body Dressing Details (indicate cue type and reason): to don back side gown from EOB d/t impaired balance Lower Body Dressing: Total assistance Lower  Body Dressing Details (indicate cue type and reason): total A to don socks from bed level Toilet Transfer: Total assistance;+2 for physical assistance Toilet Transfer Details (indicate cue type and reason): stedy         Functional mobility during ADLs: Maximal assistance;+2 for safety/equipment;+2 for  physical assistance (sit>stand in stedy from EOB) General ADL Comments: ADL participation impacted by decreased activity tolerance and L sided weakness    Extremity/Trunk Assessment Upper Extremity Assessment Upper Extremity Assessment: LUE deficits/detail LUE Deficits / Details: pt c/o pain in LUE but noted to have active movement in shoulder and elbow, gross grasp noted in L hand to hold onto stedy LUE: Unable to fully assess due to pain LUE Sensation: WNL LUE Coordination: decreased fine motor;decreased gross motor   Lower Extremity Assessment Lower Extremity Assessment: Defer to PT evaluation   Cervical / Trunk Assessment Cervical / Trunk Assessment: Kyphotic    Vision Baseline Vision/History:  (per chart review baseline vision impariment to L eye.) Patient Visual Report: No change from baseline (per gross assessment; not formally assessed)     Perception Perception Perception: Within Functional Limits   Praxis Praxis Praxis: WFL   Communication Communication Communication: No apparent difficulties   Cognition Arousal: Alert, Lethargic (initially lethargic but aroused more as session progressed) Behavior During Therapy: WFL for tasks assessed/performed Cognition: Cognition impaired     Awareness: Online awareness impaired   Attention impairment (select first level of impairment): Sustained attention Executive functioning impairment (select all impairments): Initiation, Organization, Sequencing, Reasoning, Problem solving OT - Cognition Comments: impaired attention to task noted to perseverate on extraneous stimuli needing max cues redirect back to task at hand                 Following commands: Impaired Following commands impaired: Follows one step commands with increased time      Cueing   Cueing Techniques: Verbal cues, Tactile cues  Exercises      Shoulder Instructions       General Comments Redness noted on bottom; pts niece present  intermittently during session    Pertinent Vitals/ Pain       Pain Assessment Pain Assessment: Faces Faces Pain Scale: Hurts little more Pain Location: LLE and nose from feeding tube Pain Descriptors / Indicators: Discomfort, Grimacing, Moaning Pain Intervention(s): Limited activity within patient's tolerance, Monitored during session, Repositioned  Home Living                                          Prior Functioning/Environment              Frequency  Min 2X/week        Progress Toward Goals  OT Goals(current goals can now be found in the care plan section)  Progress towards OT goals: Progressing toward goals (incrementally)  Acute Rehab OT Goals Patient Stated Goal: none stated Time For Goal Achievement: 02/20/24 Potential to Achieve Goals: Fair  Plan      Co-evaluation                 AM-PAC OT 6 Clicks Daily Activity     Outcome Measure   Help from another person eating meals?: A Little Help from another person taking care of personal grooming?: A Little Help from another person toileting, which includes using toliet, bedpan, or urinal?: A Lot Help from another person bathing (including washing, rinsing, drying)?:  A Lot Help from another person to put on and taking off regular upper body clothing?: A Lot Help from another person to put on and taking off regular lower body clothing?: Total 6 Click Score: 13    End of Session Equipment Utilized During Treatment: Gait belt;Other (comment) (stedy)  OT Visit Diagnosis: Unsteadiness on feet (R26.81);Muscle weakness (generalized) (M62.81);Cognitive communication deficit (R41.841);Hemiplegia and hemiparesis Symptoms and signs involving cognitive functions: Cerebral infarction Hemiplegia - Right/Left: Left Hemiplegia - dominant/non-dominant: Non-Dominant Hemiplegia - caused by: Cerebral infarction   Activity Tolerance Patient tolerated treatment well   Patient Left in bed;with call  bell/phone within reach;with bed alarm set;with family/visitor present   Nurse Communication Mobility status        Time: 8666-8647 OT Time Calculation (min): 19 min  Charges: OT General Charges $OT Visit: 1 Visit OT Treatments $Therapeutic Activity: 8-22 mins  Ronal Mallie POUR., COTA/L Acute Rehabilitation Services [phone]   Ronal Mallie Needy 02/18/2024, 2:34 PM

## 2024-02-18 NOTE — Assessment & Plan Note (Signed)
 Continue nutritional supplements.  Currently receiving tube feedings.

## 2024-02-18 NOTE — Progress Notes (Signed)
 Patient ID: Christina Bennett, female   DOB: 17-Jun-1941, 83 y.o.   MRN: [id number]    Progress Note from the Palliative Medicine Team at [facility]   Patient Name: Christina Bennett        Date: 02/18/2024 DOB: August 27, 1940  Age: 83 y.o. MRN#: [id number] Attending Physician: Noralee Elidia Toribio DEWAINE Primary Care Physician: Ransom Other, MD Admit Date: 02/02/2024   Reason for Consultation/Follow-up   Establishing Goals of Care   HPI/ Brief Hospital Review 83 y.o. F with COPD not on home O2, obesity, CAD, CKD IIIa baseline 0.8-1.1 who presented with dizziness, blurry vision, HA, and decrased appetite. CXR in the ER showed bilateral opacities, so she was admitted for pneumonia and possible dCHF flare. After admission, she developed acute left sided weakness overnight, CODE STROKE called, given TNK and transferred to ICU.   Today is day 16 of this hospitalization.  Patient continues with significant oropharyngeal dysphagia. Core track currently, PEG scheduled for Friday  Patient with the support of her family faces treatment option decisions, advanced directive decisions and anticipatory care needs.  Subjective  Extensive chart review has been completed prior to meeting with patient/family  including labs, vital signs, imaging, progress/consult notes, orders, medications and available advance directive documents.    This NP assessed patient at the bedside as a follow up for palliative medicine needs and emotional support and to further explore patient's treatment options decisions, advanced care decisions, and anticipatory care needs.    Patient is alert and oriented and verbalizes an understanding that she is here in the hospital secondary to a stroke.  Patient appears generally uncomfortable, with multiple complaints and shows limited engagement in her care.  Education offered on the importance of her participation in her daily activities ( mobility and oral intake)  in order to promote  improvement and recovery.   Patient continues to verbalize her desire for all offered and available medical interventions to prolong life, patient understands that PEG placement is planned for Friday.  I placed a call and was able to speak to her niece, [name] by telephone.   Ongoing education offered on the likely trajectory into the near future for [name] as it relates to need for skilled nursing facility for rehabilitation and possibly long-term care. Niece understands Recommendation for outpatient palliative services at facility  Education offered today regarding  the importance of continued conversation with family and their  medical providers regarding overall plan of care and treatment options,  ensuring decisions are within the context of the patients values and GOCs.  Her niece tells me that documents have been completed and she will bring in a copy for scanning into EMR  PMT will sign off at this time.  Goals are clearly set.  TOC working with family for SNF placement  Questions and concerns addressed   Discussed with primary team and nursing staff   Time: 50   minutes  Detailed review of medical records ( labs, imaging, vital signs), medically appropriate exam ( MS, skin, cardiac,  resp)   discussed with treatment team, counseling and education to patient, family, staff, documenting clinical information, medication management, coordination of care    Ronal Plants NP  Palliative Medicine Team Team Phone # [phone] Pager [phone]

## 2024-02-18 NOTE — TOC Progression Note (Signed)
 Transition of Care [facility]) - Progression Note    Patient Details  Name: Christina Bennett MRN: [id number] Date of Birth: Nov 05, 1940  [facility] CM/SW Contact  [name], [name] Phone Number: 02/18/2024, 3:47 PM  Clinical Narrative:   CSW spoke with patient's nieces today, they are looking at SNF options. CSW to follow.  UPDATE: CSW received update from niece, [name], that she had toured [facility] and would really like to get the patient there. CSW contacted [facility] to review referral, they have declined. CSW to follow.    Expected Discharge Plan: Skilled Nursing Facility Barriers to Discharge: Continued Medical Work up  Expected Discharge Plan and Services In-house Referral: Clinical Social Work     Living arrangements for the past 2 months: Single Family Home                                       Social Determinants of Health (SDOH) Interventions SDOH Screenings   Food Insecurity: Patient Unable To Answer (02/03/2024)  Housing: Unknown (02/03/2024)  Transportation Needs: Patient Unable To Answer (02/03/2024)  Utilities: Patient Unable To Answer (02/03/2024)  Social Connections: Patient Unable To Answer (02/03/2024)  Tobacco Use: Medium Risk (02/03/2024)    Readmission Risk Interventions     No data to display

## 2024-02-18 NOTE — Inpatient Diabetes Management (Signed)
 Inpatient Diabetes Program Recommendations  AACE/ADA: New Consensus Statement on Inpatient Glycemic Control (2015)  Target Ranges:  Prepandial:   less than 140 mg/dL      Peak postprandial:   less than 180 mg/dL (1-2 hours)      Critically ill patients:  140 - 180 mg/dL   Lab Results  Component Value Date   GLUCAP 240 (H) 02/18/2024   HGBA1C 9.2 (H) 02/03/2024    Review of Glycemic Control  Diabetes history: DM2 Outpatient Diabetes medications: Amaryl 3 mg daily, Farxiga 10 mg QD Current orders for Inpatient glycemic control: Semglee  20 units Q 24H, Novolog  0-9 units TID with meals and 0-5 HS  HgbA1C - 9.2% Glucerna 45 ml/hour Ensure Hight protein (19 grams of carbohydrate)  Inpatient Diabetes Program Recommendations:    -   Add Novolog  3 units Q4H for TF coverage  Continue to follow.  Thanks,  Clotilda Bull RN, MSN, BC-ADM Inpatient Diabetes Coordinator Team Pager [phone] (8a-5p)

## 2024-02-18 NOTE — Consult Note (Signed)
 Chief Complaint: Dysphagia after CVA; request for gastrostomy tube  Referring Provider(s): Dr. Darl  Supervising Physician: Jennefer Rover  Patient Status: [name] - [facility]-pt  History of Present Illness: Christina Bennett is a 83 y.o. female with PMH significant for esophageal stricture, COPD, CAD, CKD who presented to the ED on 6/23 with dizziness, blurred vision, HA, and poor appetite. She was admitted for pneumonia and possible dCHF flare. After admission, she developed acute left sided weakness and dysarthria overnight, leading to TNK admin and transfer to ICU. MRI brain showed small pontine infarct. Post-stroke course complicated by dysphagia. IR consulted with request for gastrostomy placement for dysphagia.   Does not use supplemental home O2.  Denies fever, chills, SOB, CP, sore throat, N/V, abd pain, blood in stool or urine, abnormal bruising, leg swelling, back pain. She does express frustration with NG tube discomfort and being unable to move LUE.    Allergies Reviewed:  Aspirin, Bee venom, Ibuprofen, and Percocet [oxycodone-acetaminophen ]   Patient is DNR/DNI  Past Medical History:  Diagnosis Date   Anemia    iron insufion on 05/2016    Atypical chest pain    Atypical CP--stress test, cor angio, and CT chest negative in 2005. Nuclear study normal in 2011    COPD (chronic obstructive pulmonary disease) (HCC)    Diabetes (HCC)    Diabetes type 2- 1/13- metformin started   Dyslipidemia    Endometriosis    with ovarian radiation in 1966, gyn Dr cousins   GERD (gastroesophageal reflux disease)    H/H   Herpes    herpes of the left eye, Dr gust optho   History of DVT (deep vein thrombosis)    on OCP   History of kidney stones    Hypertension    Mild anemia    WBC mild high, lab 2012/ HEMATOLOGY    Osteoarthritis    Osteoarthritis of the hip, knee, and hand, vicodin , prn   Osteopenia    BD in 2013   Tobacco use    quit 03/2008   Vitamin D deficiency     Past  Surgical History:  Procedure Laterality Date   APPENDECTOMY     BALLOON DILATION N/A 07/09/2016   Procedure: BALLOON DILATION;  Surgeon: Gladis MARLA Louder, MD;  Location: [facility];  Service: Endoscopy;  Laterality: N/A;   COLONOSCOPY WITH PROPOFOL  N/A 07/09/2016   Procedure: COLONOSCOPY WITH PROPOFOL ;  Surgeon: Gladis MARLA Louder, MD;  Location: [facility];  Service: Endoscopy;  Laterality: N/A;   ESOPHAGOGASTRODUODENOSCOPY N/A 07/09/2016   Procedure: ESOPHAGOGASTRODUODENOSCOPY (EGD);  Surgeon: Gladis MARLA Louder, MD;  Location: [facility];  Service: Endoscopy;  Laterality: N/A;   EYE SURGERY     left eye cataract srugery    HERNIA REPAIR     surgery fro endometriosis     TONSILLECTOMY        Medications: Prior to Admission medications   Medication Sig Start Date End Date Taking? Authorizing Provider  albuterol  (PROVENTIL  HFA;VENTOLIN  HFA) 108 (90 BASE) MCG/ACT inhaler Inhale 2 puffs into the lungs every 4 (four) hours as needed for wheezing or shortness of breath.    Yes [provider]  BREZTRI  AEROSPHERE 160-9-4.8 MCG/ACT AERO Inhale 2 puffs into the lungs 2 (two) times daily. 03/19/21  Yes [provider]  fluticasone (FLONASE) 50 MCG/ACT nasal spray Place 2 sprays into both nostrils every evening.  08/24/14  Yes [provider]  glimepiride (AMARYL) 2 MG tablet Take 2 mg by mouth in  the morning and at bedtime. 09/16/22  Yes [provider]  HYDROcodone -acetaminophen  (NORCO) 7.5-325 MG tablet Take 1 tablet by mouth 2 (two) times daily as needed for moderate pain.    Yes [provider]  losartan  (COZAAR ) 100 MG tablet Take 100 mg by mouth daily. 10/26/18  Yes [provider]  montelukast  (SINGULAIR ) 10 MG tablet Take 10 mg by mouth every evening.  01/31/16  Yes [provider]  pantoprazole  (PROTONIX ) 40 MG tablet Take 40 mg by mouth daily. 01/13/24  Yes [provider]  polyvinyl alcohol  (LIQUIFILM TEARS) 1.4 %  ophthalmic solution Place 1 drop into the left eye daily.   Yes [provider]  prednisoLONE  acetate (PRED FORTE ) 1 % ophthalmic suspension Place 1 drop into the left eye daily at 12 noon.   Yes [provider]  simvastatin  (ZOCOR ) 40 MG tablet Take 40 mg by mouth every evening.   Yes [provider]  valACYclovir  (VALTREX ) 1000 MG tablet Take 1,000 mg by mouth daily.   Yes [provider]  Vitamin D, Ergocalciferol, (DRISDOL) 50000 UNITS CAPS Take 50,000 Units by mouth every 14 (fourteen) days.   Yes [provider]     Family History  Problem Relation Age of Onset   Hypertension Mother    Heart disease Brother    Heart disease Brother     Social History   Socioeconomic History   Marital status: Single    Spouse name: Not on file   Number of children: Not on file   Years of education: Not on file   Highest education level: Not on file  Occupational History   Not on file  Tobacco Use   Smoking status: Former   Smokeless tobacco: Never  Substance and Sexual Activity   Alcohol  use: No   Drug use: No   Sexual activity: Not on file  Other Topics Concern   Not on file  Social History Narrative   Not on file   Social Drivers of Health   Financial Resource Strain: Not on file  Food Insecurity: Patient Unable To Answer (02/03/2024)   Hunger Vital Sign    Worried About Running Out of Food in the Last Year: Patient unable to answer    Ran Out of Food in the Last Year: Patient unable to answer  Transportation Needs: Patient Unable To Answer (02/03/2024)   PRAPARE - Transportation    Lack of Transportation (Medical): Patient unable to answer    Lack of Transportation (Non-Medical): Patient unable to answer  Physical Activity: Not on file  Stress: Not on file  Social Connections: Patient Unable To Answer (02/03/2024)   Social Connection and Isolation Panel    Frequency of Communication with Friends and Family: Patient unable to answer     Frequency of Social Gatherings with Friends and Family: Patient unable to answer    Attends Religious Services: Patient unable to answer    Active Member of Clubs or Organizations: Patient unable to answer    Attends Banker Meetings: Patient unable to answer    Marital Status: Patient unable to answer     Review of Systems: A 12 point ROS discussed and pertinent positives are indicated in the HPI above.  All other systems are negative.    Vital Signs: BP (!) 126/100 (BP Location: Left Arm)   Pulse 87   Temp 97.7 F (36.5 C) (Oral)   Resp 20   Ht 5' 2 (1.575 m)   Wt 189  lb 9.5 oz (86 kg)   SpO2 96%   BMI 34.68 kg/m   Advance Care Plan: The advanced care plan/surrogate decision maker was discussed at the time of visit and documented in the medical record.    Physical Exam HENT:     Nose:     Comments: NG present with bridle securement device    Mouth/Throat:     Mouth: Mucous membranes are dry.     Pharynx: Oropharynx is clear.  Cardiovascular:     Rate and Rhythm: Normal rate and regular rhythm.     Pulses: Normal pulses.     Heart sounds: Normal heart sounds.  Pulmonary:     Effort: Pulmonary effort is normal.     Breath sounds: Normal breath sounds.  Abdominal:     General: Bowel sounds are normal. There is no distension.     Palpations: Abdomen is soft.     Tenderness: There is no abdominal tenderness.     Comments: No rash or wounds over planned puncture location. Lower abd bruising present (on lovenox )  Skin:    General: Skin is warm and dry.  Neurological:     Mental Status: She is alert and oriented to person, place, and time.     Motor: Weakness present.     Comments: Left sided deficits after recent stroke  Psychiatric:        Mood and Affect: Mood normal.        Behavior: Behavior normal.     Imaging: DG CHEST PORT 1 VIEW Result Date: 02/13/2024 CLINICAL DATA:  Chest pain EXAM: PORTABLE CHEST 1 VIEW COMPARISON:  02/11/2024  FINDINGS: Cardiomegaly. Right base atelectasis. Left lung clear. No effusions or edema. No acute bony abnormality. IMPRESSION: Cardiomegaly. Right base atelectasis. Electronically Signed   By: Franky Crease M.D.   On: 02/13/2024 17:41   CT ABDOMEN WO CONTRAST Result Date: 02/12/2024 CLINICAL DATA:  Gastrostomy status EXAM: CT ABDOMEN WITHOUT CONTRAST TECHNIQUE: Multidetector CT imaging of the abdomen was performed following the standard protocol without IV contrast. RADIATION DOSE REDUCTION: This exam was performed according to the departmental dose-optimization program which includes automated exposure control, adjustment of the mA and/or kV according to patient size and/or use of iterative reconstruction technique. COMPARISON:  CT 05/07/2016 FINDINGS: Lower chest: Bibasilar atelectasis.  No acute abnormality. Hepatobiliary: No acute abnormality. Pancreas: Unremarkable. Spleen: Unremarkable. Adrenals/Urinary Tract: Left adrenal nodule is unchanged since 2017 compatible with benign adenoma. No follow-up recommended. Normal right adrenal gland. Right nonobstructing nephrolithiasis versus vascular calcifications. No hydronephrosis. Stomach/Bowel: Enteric tube tip in the gastric antrum. The visualized small bowel and colon are unremarkable. No bowel interposed between the greater curvature of the stomach and the anterior abdominal wall. Vascular/Lymphatic: Aortic atherosclerotic calcification. No lymphadenopathy. Other: No free intraperitoneal fluid or air is visualized. Musculoskeletal: No acute fracture. IMPRESSION: No acute abnormality. No bowel interposed between the greater curvature of the stomach and the anterior abdominal wall. Feeding tube tip in the stomach. Electronically Signed   By: Norman Gatlin M.D.   On: 02/12/2024 21:34   DG Chest 2 View Result Date: 02/11/2024 CLINICAL DATA:  Hypoxemia. EXAM: CHEST - 2 VIEW COMPARISON:  Radiograph 02/08/2024 FINDINGS: The heart is borderline enlarged. Mediastinal  contours are stable. Improvement in interstitial opacities from prior exam. No developing airspace disease. Minor subsegmental atelectasis at the lung bases. No pleural effusion. No pneumothorax. IMPRESSION: Improvement in interstitial opacities from prior exam. No developing airspace disease. Electronically Signed   By: Andrea Marlee HERO.D.  On: 02/11/2024 14:43   DG Chest 2 View Result Date: 02/08/2024 CLINICAL DATA:  Hypoxia EXAM: CHEST - 2 VIEW COMPARISON:  02/02/2024 FINDINGS: Mild bilateral interstitial edema or infiltrates may be marginally increased. Heart size upper limits normal. Aortic Atherosclerosis (ICD10-170.0). No definite effusion. Vertebral endplate spurring at multiple levels in the mid and lower thoracic spine. IMPRESSION: Mild bilateral interstitial edema or infiltrates. Electronically Signed   By: JONETTA Faes M.D.   On: 02/08/2024 09:44   MR BRAIN WO CONTRAST Result Date: 02/06/2024 CLINICAL DATA:  Follow-up examination for stroke. EXAM: MRI HEAD WITHOUT CONTRAST TECHNIQUE: Multiplanar, multiecho pulse sequences of the brain and surrounding structures were obtained without intravenous contrast. COMPARISON:  Comparison made with prior CTs from 02/05/2024. FINDINGS: Brain: Cerebral volume within normal limits. Patchy T2/FLAIR hyperintensity involving the periventricular deep white matter both cerebral hemispheres, consistent chronic small vessel ischemic disease, moderate in nature. Small remote lacunar infarct present at the right frontal corona radiata. Focus of restricted diffusion measuring 1.7 x 0.8 x 1.1 cm seen involving the right pons, consistent with an acute ischemic infarct (series 5, image 68). No associated hemorrhage or mass effect. No other evidence for acute or subacute ischemia. Gray-white matter differentiation otherwise maintained. No areas of chronic cortical infarction. No acute or significant chronic intracranial blood products. No mass lesion, midline shift or mass  effect. No hydrocephalus or extra-axial fluid collection. Pituitary gland within normal limits. Vascular: Major intracranial vascular flow voids are maintained. Skull and upper cervical spine: Craniocervical junction within normal limits. Bone marrow signal intensity overall within normal limits. No scalp soft tissue abnormality. Sinuses/Orbits: Prior bilateral ocular lens replacement. Paranasal sinuses are largely clear. Trace right mastoid effusion noted, of doubtful significance. Other: None. IMPRESSION: 1. 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 2. Underlying moderate chronic microvascular ischemic disease with small remote lacunar infarct at the right frontal corona radiata. Electronically Signed   By: Morene Hoard M.D.   On: 02/06/2024 04:55   DG Swallowing Func-Speech Pathology Result Date: 02/05/2024 Table formatting from the original result was not included. Modified Barium Swallow Study Patient Details Name: Christina Bennett MRN: [id number] Date of Birth: 1940-11-10 Today's Date: 02/05/2024 HPI/PMH: HPI: Patient is an 83 y.o. female with PMH: CAD, COPD, essential HTN, CKD stage III, morbid obesity. She presented to the Ed on 02/03/24 with multiple complaints inclucding generalized weakeness, HA, poor appetite, chest congestion, blurry vision and family reporting change in her speech. ED evaluation found her to be hemodynamically stable but with BP up to 183/86, UA showed evidence of UTI. CT head negative for acute intracranial abnormality or hemorrhage but did show moderate periventricular WM changes likely reflecting the sequela of small vessel ischemia. CXR showed diffuse interstitial opacities, R>L. While in ED, patient became very restless, anxious and fidgety. Despite multiple doses of Ativan  to attempt MRI,, it was canceled due to patient not cooperating. Non-violent restraints placed. SLP swallow evalauation ordered secondary to family reports of swallowing difficulities  as well as RN observing patient to not be able to swallow even smaller pills, and coughing with liquids.  MBSS 6/25 with recs for D3/NTL. Pt with code stroke overnight and reassessed 6/26 with change in swallow function. Clinical Impression: Pt has had a change in oral and pharyngeal function since initial MBS on previous date. She has slower lingual transit and mastication, with less lingual control and more oral residue, although residue is mild. She has difficulty getting thickened liquids up via straw. New pharyngeal impairments also  include reduced base of tongue retraction and pharyngeal squeeze. Laryngeal vestibule closure and epiglottic inversion are incomplete, although thicker boluses help invert the epiglottis a little more. These pharyngeal deficits, combined with more sluggish oral phase, result in boluses dumping into the larynx and pharynx before the swallow, with aspiration of thin and nectar thick liquids that is sensed but not cleared (PAS 7). This includes gross aspiration from one small cup sip of nectar thick liquids, that elicited persistent coughing from the pt throughout the remaining duration of the study. Pt had no aspiration with honey thick liquids or solids, but given this persistent coughing, it was hard to challenge her to take larger volumes at a time. Recommend that she resume a PO diet with assistance during intake, starting with Dys 2 (finely chopped) diet and honey thick liquids by cup. DIGEST Swallow Severity Rating*  Safety: 4  Efficiency: 1  Overall Pharyngeal Swallow Severity: 3 1: mild; 2: moderate; 3: severe; 4: profound *The Dynamic Imaging Grade of Swallowing Toxicity is standardized for the head and neck cancer population, however, demonstrates promising clinical applications across populations to standardize the clinical rating of pharyngeal swallow safety and severity. Factors that may increase risk of adverse event in presence of aspiration Noe & Lianne 2021):  Factors that may increase risk of adverse event in presence of aspiration Noe & Lianne 2021): Frequent aspiration of large volumes; Aspiration of thick, dense, and/or acidic materials; Weak cough; Reduced cognitive function; Dependence for feeding and/or oral hygiene Recommendations/Plan: Swallowing Evaluation Recommendations Swallowing Evaluation Recommendations Recommendations: PO diet PO Diet Recommendation: Dysphagia 2 (Finely chopped); Moderately thick liquids (Level 3, honey thick) Liquid Administration via: Cup; Spoon Medication Administration: Crushed with puree Supervision: Staff to assist with self-feeding; Full supervision/cueing for swallowing strategies Swallowing strategies  : Slow rate; Small bites/sips Postural changes: Position pt fully upright for meals; Stay upright 30-60 min after meals Oral care recommendations: Oral care BID (2x/day) Caregiver Recommendations: Avoid jello, ice cream, thin soups, popsicles; Remove water  pitcher; Have oral suction available Treatment Plan Treatment Plan Treatment recommendations: Therapy as outlined in treatment plan below Follow-up recommendations: Home health SLP Functional status assessment: Patient has had a recent decline in their functional status and demonstrates the ability to make significant improvements in function in a reasonable and predictable amount of time. Treatment frequency: Min 2x/week Treatment duration: 2 weeks Interventions: Aspiration precaution training; Patient/family education; Trials of upgraded texture/liquids; Diet toleration management by SLP; Oropharyngeal exercises Recommendations Recommendations for follow up therapy are one component of a multi-disciplinary discharge planning process, led by the attending physician.  Recommendations may be updated based on patient status, additional functional criteria and insurance authorization. Assessment: Orofacial Exam: Orofacial Exam Oral Cavity: Oral Hygiene: WFL Oral Cavity -  Dentition: Dentures, top; Missing dentition; Poor condition (lower arch) Orofacial Anatomy: WFL Oral Motor/Sensory Function: WFL Anatomy: Anatomy: Suspected cervical osteophytes Boluses Administered: Boluses Administered Boluses Administered: Thin liquids (Level 0); Mildly thick liquids (Level 2, nectar thick); Moderately thick liquids (Level 3, honey thick); Puree; Solid  Oral Impairment Domain: Oral Impairment Domain Lip Closure: Interlabial escape, no progression to anterior lip Tongue control during bolus hold: Escape to lateral buccal cavity/floor of mouth Bolus preparation/mastication: Slow prolonged chewing/mashing with complete recollection Bolus transport/lingual motion: Slow tongue motion Oral residue: Residue collection on oral structures Location of oral residue : Floor of mouth; Tongue; Palate Initiation of pharyngeal swallow : Pyriform sinuses  Pharyngeal Impairment Domain: Pharyngeal Impairment Domain Soft palate elevation: No bolus between soft palate (SP)/pharyngeal wall (  PW) Laryngeal elevation: Complete superior movement of thyroid  cartilage with complete approximation of arytenoids to epiglottic petiole Anterior hyoid excursion: Partial anterior movement Epiglottic movement: Partial inversion Laryngeal vestibule closure: Incomplete, narrow column air/contrast in laryngeal vestibule Pharyngeal stripping wave : Present - diminished Pharyngeal contraction (A/P view only): N/A Pharyngoesophageal segment opening: Partial distention/partial duration, partial obstruction of flow Tongue base retraction: Narrow column of contrast or air between tongue base and PPW Pharyngeal residue: Collection of residue within or on pharyngeal structures Location of pharyngeal residue: Valleculae; Tongue base  Esophageal Impairment Domain: Esophageal Impairment Domain Esophageal clearance upright position: Esophageal retention Pill: Pill Consistency administered: Puree Puree: WFL Penetration/Aspiration Scale Score:  Penetration/Aspiration Scale Score 1.  Material does not enter airway: Moderately thick liquids (Level 3, honey thick); Puree; Solid 3.  Material enters airway, remains ABOVE vocal cords and not ejected out: Mildly thick liquids (Level 2, nectar thick) 5.  Material enters airway, CONTACTS cords and not ejected out: Mildly thick liquids (Level 2, nectar thick) 7.  Material enters airway, passes BELOW cords and not ejected out despite cough attempt by patient: Thin liquids (Level 0); Mildly thick liquids (Level 2, nectar thick) Compensatory Strategies: Compensatory Strategies Compensatory strategies: No Effortful swallow: Ineffective Ineffective Effortful Swallow: Thin liquid (Level 0) Chin tuck: Effective Effective Chin Tuck: Thin liquid (Level 0) Ineffective Chin Tuck: Thin liquid (Level 0)   General Information: Caregiver present: Yes Banker)  Diet Prior to this Study: NPO   Temperature : Normal   Respiratory Status: WFL   Supplemental O2: Nasal cannula   History of Recent Intubation: No  Behavior/Cognition: Alert; Requires cueing; Distractible Self-Feeding Abilities: Needs assist with self-feeding Baseline vocal quality/speech: Dysphonic Volitional Cough: Able to elicit Volitional Swallow: Able to elicit Exam Limitations: Poor bolus acceptance (limited acceptance due to coughing episodes) Goal Planning: Prognosis for improved oropharyngeal function: Fair Barriers to Reach Goals: Time post onset; Cognitive deficits No data recorded Patient/Family Stated Goal: not stated; RN reports requests for food and drink Consulted and agree with results and recommendations: Patient; Nurse Pain: Pain Assessment Pain Assessment: Faces Pain Score: 0 Faces Pain Scale: 0 Facial Expression: 0 Body Movements: 0 Muscle Tension: 0 Compliance with ventilator (intubated pts.): N/A Vocalization (extubated pts.): 0 CPOT Total: 0 End of Session: Start Time:SLP Start Time (ACUTE ONLY): 1453 Stop Time: SLP Stop Time (ACUTE ONLY): 1519 Time  Calculation:SLP Time Calculation (min) (ACUTE ONLY): 26 min Charges: SLP Evaluations $ SLP Speech Visit: 1 Visit SLP Evaluations $BSS Swallow: 1 Procedure $MBS Swallow: 1 Procedure $ SLP EVAL LANGUAGE/SOUND PRODUCTION: 1 Procedure SLP visit diagnosis: SLP Visit Diagnosis: Dysphagia, oropharyngeal phase (R13.12) Past Medical History: Past Medical History: Diagnosis Date  Anemia   iron insufion on 05/2016   Atypical chest pain   Atypical CP--stress test, cor angio, and CT chest negative in 2005. 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  History of DVT (deep vein thrombosis)   on OCP  History of kidney stones   Hypertension   Mild anemia   WBC mild high, lab 2012/ HEMATOLOGY   Osteoarthritis   Osteoarthritis of the hip, knee, and hand, vicodin , prn  Osteopenia   BD in 2013  Tobacco use  quit 03/2008  Vitamin D deficiency  Past Surgical History: Past Surgical History: Procedure Laterality Date  APPENDECTOMY    BALLOON DILATION N/A 07/09/2016  Procedure: BALLOON DILATION;  Surgeon: Gladis MARLA Louder, MD;  Location: [facility];  Service: Endoscopy;  Laterality: N/A;  COLONOSCOPY WITH PROPOFOL  N/A 07/09/2016  Procedure: COLONOSCOPY WITH PROPOFOL ;  Surgeon: Gladis MARLA Louder, MD;  Location: [facility];  Service: Endoscopy;  Laterality: N/A;  ESOPHAGOGASTRODUODENOSCOPY N/A 07/09/2016  Procedure: ESOPHAGOGASTRODUODENOSCOPY (EGD);  Surgeon: Gladis MARLA Louder, MD;  Location: [facility];  Service: Endoscopy;  Laterality: N/A;  EYE SURGERY    left eye cataract srugery   HERNIA REPAIR    surgery fro endometriosis    TONSILLECTOMY   Leita SAILOR., M.A. CCC-SLP Acute Rehabilitation Services Office: [phone] Secure chat preferred 02/05/2024, 3:59  PM  ECHOCARDIOGRAM COMPLETE Result Date: 02/05/2024    ECHOCARDIOGRAM REPORT   Patient Name:   Christina Bennett Date of Exam: 02/05/2024 Medical Rec #:  [id number]        Height:       62.0 in Accession #:    [id number]       Weight:       197.5 lb Date of Birth:  Jan 02, 1941        BSA:          1.902 m Patient Age:    83 years         BP:           145/62 mmHg Patient Gender: F                HR:           88 bpm. Exam Location:  Inpatient Procedure: 2D Echo, Intracardiac Opacification Agent, Cardiac Doppler and Color            Doppler (Both Spectral and Color Flow Doppler were utilized during            procedure). Indications:    stroke  History:        Patient has prior history of Echocardiogram examinations, most                 recent 09/14/2020.  Sonographer:    Therisa Crouch Referring Phys: [id number] SRISHTI L BHAGAT IMPRESSIONS  1. Left ventricular ejection fraction, by estimation, is 55 to 60%. The left ventricle has normal function. The left ventricle has no regional wall motion abnormalities. Left ventricular diastolic parameters are consistent with Grade I diastolic dysfunction (impaired relaxation).  2. Right ventricular systolic function is normal. The right ventricular size is normal.  3. The mitral valve is normal in structure. No evidence of mitral valve regurgitation. No evidence of mitral stenosis.  4. The aortic valve is normal in structure. Aortic valve regurgitation is not visualized. No aortic stenosis is present.  5. The inferior vena cava is normal in size with greater than 50% respiratory variability, suggesting right atrial pressure of 3 mmHg. FINDINGS  Left Ventricle: Left ventricular ejection fraction, by estimation, is 55 to 60%. The left ventricle has normal function. The left ventricle has no regional wall motion abnormalities. The left ventricular internal cavity size was normal in size. There is  no left ventricular hypertrophy. Left ventricular diastolic parameters are consistent  with Grade I diastolic dysfunction (impaired relaxation). Right Ventricle: The right ventricular size is normal. No increase in right ventricular wall thickness. Right ventricular systolic function is normal. Left Atrium: Left atrial size was normal in size. Right Atrium: Right atrial size  was normal in size. Pericardium: There is no evidence of pericardial effusion. Mitral Valve: The mitral valve is normal in structure. No evidence of mitral valve regurgitation. No evidence of mitral valve stenosis. Tricuspid Valve: The tricuspid valve is normal in structure. Tricuspid valve regurgitation is not demonstrated. No evidence of tricuspid stenosis. Aortic Valve: The aortic valve is normal in structure. Aortic valve regurgitation is not visualized. No aortic stenosis is present. Pulmonic Valve: The pulmonic valve was normal in structure. Pulmonic valve regurgitation is not visualized. No evidence of pulmonic stenosis. Aorta: The aortic root is normal in size and structure. Venous: The inferior vena cava is normal in size with greater than 50% respiratory variability, suggesting right atrial pressure of 3 mmHg. IAS/Shunts: No atrial level shunt detected by color flow Doppler.   Diastology LV e' medial:    4.79 cm/s LV E/e' medial:  17.2 LV e' lateral:   7.18 cm/s LV E/e' lateral: 11.5  RIGHT VENTRICLE             IVC RV Basal diam:  2.80 cm     IVC diam: 1.90 cm RV S prime:     14.90 cm/s TAPSE (M-mode): 1.8 cm LEFT ATRIUM             Index LA Vol (A2C):   51.1 ml 26.87 ml/m LA Vol (A4C):   47.8 ml 25.14 ml/m LA Biplane Vol: 52.3 ml 27.50 ml/m  MITRAL VALVE MV Area (PHT): 5.38 cm MV Decel Time: 141 msec MV E velocity: 82.30 cm/s MV A velocity: 108.00 cm/s MV E/A ratio:  0.76 Aditya Sabharwal Electronically signed by Ria Commander Signature Date/Time: 02/05/2024/11:47:44 AM    Final    CT ANGIO HEAD NECK W WO CM (CODE STROKE) Result Date: 02/05/2024 CLINICAL DATA:  Initial evaluation for acute neuro deficit,  stroke suspected. EXAM: CT ANGIOGRAPHY HEAD AND NECK WITH AND WITHOUT CONTRAST TECHNIQUE: Multidetector CT imaging of the head and neck was performed using the standard protocol during bolus administration of intravenous contrast. Multiplanar CT image reconstructions and MIPs were obtained to evaluate the vascular anatomy. Carotid stenosis measurements (when applicable) are obtained utilizing NASCET criteria, using the distal internal carotid diameter as the denominator. RADIATION DOSE REDUCTION: This exam was performed according to the departmental dose-optimization program which includes automated exposure control, adjustment of the mA and/or kV according to patient size and/or use of iterative reconstruction technique. CONTRAST:  75mL OMNIPAQUE  IOHEXOL  350 MG/ML SOLN COMPARISON:  CT from earlier the same day. FINDINGS: For caliber standard branch pattern. Aortic atherosclerosis. No significant stenosis about the origin the great vessels. CTA NECK FINDINGS Aortic arch: Visualized aortic arch within normal limits Right carotid system: Right common and internal carotid arteries are tortuous but patent without dissection. Atheromatous change about the right carotid bulb without hemodynamically significant greater than 50% stenosis. Left carotid system: Left common and internal carotid arteries are tortuous but patent without dissection. Atheromatous change about the left carotid bulb without hemodynamically significant greater than 50% stenosis. Vertebral arteries: Both vertebral arteries arise from the subclavian arteries. Atheromatous change at the origin of the right vertebral artery with moderate stenosis. Vertebral arteries are tortuous but otherwise patent. No other stenosis or dissection. Skeleton: Few small sclerotic foci in noted within the cervical spine, favored to reflect small benign bone islands. No worrisome osseous lesions. Moderate to advanced cervical spondylosis. Patient is largely edentulous.  Other neck: No other acute finding. Few small right thyroid  nodules measuring up to 9 mm noted, of doubtful  significance given size and patient age, no follow-up imaging recommended. Upper chest: No other acute finding. Review of the MIP images confirms the above findings CTA HEAD FINDINGS Anterior circulation: Atheromatous change about the carotid siphons without hemodynamically significant stenosis. A1 segments patent bilaterally. Normal anterior communicating artery complex. Both ACAs patent without stenosis. No M1 stenosis or occlusion. Distal MCA branches perfused and symmetric. Posterior circulation: Both V4 segments patent without significant stenosis. Both PICA patent. Basilar patent without stenosis. Superior cerebellar arteries patent bilaterally. Both PCAs primarily supplied via the basilar. Focal severe proximal right P2 stenosis (series 15, image 18). PCAs otherwise widely patent to their distal aspects. Venous sinuses: Patent allowing for timing the contrast bolus. Anatomic variants: As above.  No aneurysm. Review of the MIP images confirms the above findings IMPRESSION: 1. Negative CTA for large vessel occlusion or other emergent finding. 2. Focal severe proximal right P2 stenosis. 3. Moderate stenosis at the origin of the right vertebral artery. 4. Atheromatous change about the carotid bifurcations and carotid siphons without hemodynamically significant stenosis. 5. Diffuse tortuosity of the major arterial vasculature of the head and neck, suggesting chronic underlying hypertension. 6.  Aortic Atherosclerosis (ICD10-I70.0). These results were communicated to Dr. Jerrie at 2:47 am on 02/05/2024 by text page via the [facility] messaging system. Electronically Signed   By: Morene Hoard M.D.   On: 02/05/2024 02:49   CT HEAD CODE STROKE WO CONTRAST Result Date: 02/05/2024 CLINICAL DATA:  Code stroke. Initial evaluation for acute neuro deficit, stroke suspected. EXAM: CT HEAD WITHOUT CONTRAST  TECHNIQUE: Contiguous axial images were obtained from the base of the skull through the vertex without intravenous contrast. RADIATION DOSE REDUCTION: This exam was performed according to the departmental dose-optimization program which includes automated exposure control, adjustment of the mA and/or kV according to patient size and/or use of iterative reconstruction technique. COMPARISON:  Head CT from 02/02/2024 FINDINGS: Brain: Cerebral volume within normal limits. Patchy hypodensity involving the supratentorial cerebral white matter, consistent with chronic small vessel ischemic disease, moderately advanced in nature. Small remote lacunar infarct present at the right frontal corona radiata. Overall, appearance is stable from prior. No acute intracranial hemorrhage. No acute large vessel territory infarct. No mass lesion or midline shift. No hydrocephalus or extra-axial fluid collection. Vascular: No abnormal hyperdense vessel. Calcified atherosclerosis present at the skull base. Skull: Scalp soft tissues within normal limits.  Calvarium intact. Sinuses/Orbits: Globes orbital soft tissues within normal limits. Paranasal sinuses are clear. Small chronic appearing right mastoid effusion, stable. Left mastoid air cells are clear. Other: None. ASPECTS [facility] Stroke Program Early CT Score) - Ganglionic level infarction (caudate, lentiform nuclei, internal capsule, insula, M1-M3 cortex): 7 - Supraganglionic infarction (M4-M6 cortex): 3 Total score (0-10 with 10 being normal): 10 IMPRESSION: 1. Stable head CT.  No acute intracranial abnormality. 2. ASPECTS is 10. 3. Moderate chronic microvascular ischemic disease, with small remote lacunar infarct at the right frontal corona radiata. Results communicated by telephone at the time of interpretation on 02/05/2024 at 2:16 am to provider Dr. Jerrie. Electronically Signed   By: Morene Hoard M.D.   On: 02/05/2024 02:18   DG Swallowing Func-Speech Pathology Result  Date: 02/04/2024 Table formatting from the original result was not included. Modified Barium Swallow Study Patient Details Name: Christina Bennett MRN: [id number] Date of Birth: 08-25-1940 Today's Date: 02/04/2024 HPI/PMH: HPI: Patient is an 83 y.o. female with PMH: CAD, COPD, essential HTN, CKD stage III, morbid obesity. She presented to the Ed on 02/03/24 with  multiple complaints inclucding generalized weakeness, HA, poor appetite, chest congestion, blurry vision and family reporting change in her speech. ED evaluation found her to be hemodynamically stable but with BP up to 183/86, UA showed evidence of UTI. CT head negative for acute intracranial abnormality or hemorrhage but did show moderate periventricular WM changes likely reflecting the sequela of small vessel ischemia. CXR showed diffuse interstitial opacities, R>L. While in ED, patient became very restless, anxious and fidgety. Despite multiple doses of Ativan  to attempt MRI,, it was canceled due to patient not cooperating. Non-violent restraints placed. SLP swallow evalauation ordered secondary to family reports of swallowing difficulities as well as RN observing patient to not be able to swallow even smaller pills, and coughing with liquids. 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 Swallow is initiated at level of pyriform sinus with thin and nectar thick liquids but incidents of thin liquids entering laryngeal vestibule before swallow initiated were also observed. Anterior hyoid excursion was partial in completion, and although epiglottic inversion was complete, laryngeal vestibule closure was incomplete and delayed. This led to thin liquid aspiration which was sensed every time (PAS 8) with 1/4 spoon size, 1/2 spoon size and small controlled cup size sips. Chin tuck posture with small sips of thin liquids was  effective to protect airway and prevent aspiration but small straw sip in chin tuck position was not effective to prevent aspiration. Nectar thick liquids from various sizes of cup sips were tolerated without aspiration but with penetration above the vocal cords (PAS 3). Mastication of solids was mildly prolonged and expected to be primarily due to patient's poor condition of dentiton. Masticated solids bolus and puree solids boluses both transited pharyngeally without delay or difficulty. Esophageal sweep revealed timely transit of barium tablet but with some barium in distal portion of esophagus. SLP is recommending Dys 3 (mechanical soft) solids and nectar thick liquids. SLP will plan to follow for toleration, as well as education and training of patient to consistently perform chin tuck with small sips of thin liquids. Factors that may increase risk of adverse event in presence of aspiration Noe & Lianne 2021): Factors that may increase risk of adverse event in presence of aspiration Noe & Lianne 2021): Frequent aspiration of large volumes Recommendations/Plan: Swallowing Evaluation Recommendations Swallowing Evaluation Recommendations Recommendations: PO diet PO Diet Recommendation: Dysphagia 3 (Mechanical soft); Mildly thick liquids (Level 2, nectar thick) Liquid Administration via: Cup Medication Administration: Whole meds with puree Supervision: Patient able to self-feed; Intermittent supervision/cueing for swallowing strategies Swallowing strategies  : Slow rate; Small bites/sips Postural changes: Position pt fully upright for meals; Stay upright 30-60 min after meals Oral care recommendations: Oral care BID (2x/day) Treatment Plan Treatment Plan Treatment recommendations: Therapy as outlined in treatment plan below Follow-up recommendations: Home health SLP Functional status assessment: Patient has had a recent decline in their functional status and demonstrates the ability to make significant  improvements in function in a reasonable and predictable amount of time. Treatment frequency: Min 2x/week Treatment duration: 1 week Interventions: Aspiration precaution training; Diet toleration management by SLP; Compensatory techniques; Patient/family education; Trials of upgraded texture/liquids Recommendations Recommendations for follow up therapy are one component of a multi-disciplinary discharge planning process, led by the attending physician.  Recommendations may be updated based on patient status, additional functional criteria and insurance authorization. Assessment: Orofacial Exam: Orofacial Exam Oral Cavity: Oral Hygiene: WFL Oral Cavity - Dentition:  Poor condition Orofacial Anatomy: WFL Oral Motor/Sensory Function: WFL Anatomy: Anatomy: Suspected cervical osteophytes Boluses Administered: Boluses Administered Boluses Administered: Thin liquids (Level 0); Moderately thick liquids (Level 3, honey thick); Mildly thick liquids (Level 2, nectar thick); Puree; Solid  Oral Impairment Domain: Oral Impairment Domain Lip Closure: No labial escape Tongue control during bolus hold: Cohesive bolus between tongue to palatal seal Bolus preparation/mastication: Timely and efficient chewing and mashing Bolus transport/lingual motion: Brisk tongue motion Oral residue: Complete oral clearance Location of oral residue : N/A Initiation of pharyngeal swallow : Pyriform sinuses; Posterior laryngeal surface of the epiglottis  Pharyngeal Impairment Domain: Pharyngeal Impairment Domain Soft palate elevation: No bolus between soft palate (SP)/pharyngeal wall (PW) Laryngeal elevation: Complete superior movement of thyroid  cartilage with complete approximation of arytenoids to epiglottic petiole Anterior hyoid excursion: Partial anterior movement Epiglottic movement: Complete inversion Laryngeal vestibule closure: Incomplete, narrow column air/contrast in laryngeal vestibule Pharyngeal stripping wave : Present - complete  Pharyngeal contraction (A/P view only): N/A Pharyngoesophageal segment opening: Complete distension and complete duration, no obstruction of flow Tongue base retraction: No contrast between tongue base and posterior pharyngeal wall (PPW) Pharyngeal residue: Collection of residue within or on pharyngeal structures Location of pharyngeal residue: Tongue base  Esophageal Impairment Domain: Esophageal Impairment Domain Esophageal clearance upright position: Esophageal retention Pill: Pill Consistency administered: Puree Puree: WFL Penetration/Aspiration Scale Score: Penetration/Aspiration Scale Score 1.  Material does not enter airway: Moderately thick liquids (Level 3, honey thick); Puree; Solid 3.  Material enters airway, remains ABOVE vocal cords and not ejected out: Mildly thick liquids (Level 2, nectar thick) 5.  Material enters airway, CONTACTS cords and not ejected out: Mildly thick liquids (Level 2, nectar thick) 7.  Material enters airway, passes BELOW cords and not ejected out despite cough attempt by patient: Thin liquids (Level 0) Compensatory Strategies: Compensatory Strategies Compensatory strategies: Yes Effortful swallow: Ineffective Ineffective Effortful Swallow: Thin liquid (Level 0) Chin tuck: Effective Effective Chin Tuck: Thin liquid (Level 0) Ineffective Chin Tuck: Thin liquid (Level 0)   General Information: No data recorded Diet Prior to this Study: Regular; Thin liquids (Level 0)   Temperature : Normal   Respiratory Status: WFL   Supplemental O2: Nasal cannula   History of Recent Intubation: No  Behavior/Cognition: Alert; Cooperative; Pleasant mood Self-Feeding Abilities: Able to self-feed Baseline vocal quality/speech: Dysphonic Volitional Cough: Able to elicit Volitional Swallow: Able to elicit Exam Limitations: No limitations Goal Planning: Prognosis for improved oropharyngeal function: Good Barriers to Reach Goals: Time post onset No data recorded Patient/Family Stated Goal: no family  present,, patient calling out for nurse to take restraints off Consulted and agree with results and recommendations: Patient Pain: Pain Assessment Pain Assessment: No/denies pain Pain Score: 0 Pain Location: crying but doesnt seem to be pain but frustration End of Session: Start Time:SLP Start Time (ACUTE ONLY): 1017 Stop Time: SLP Stop Time (ACUTE ONLY): 1035 Time Calculation:SLP Time Calculation (min) (ACUTE ONLY): 18 min Charges: SLP Evaluations $ SLP Speech Visit: 1 Visit SLP Evaluations $BSS Swallow: 1 Procedure $MBS Swallow: 1 Procedure SLP visit diagnosis: SLP Visit Diagnosis: Dysphagia, oropharyngeal phase (R13.12) Past Medical History: Past Medical History: Diagnosis Date  Anemia   iron insufion on 05/2016   Atypical chest pain   Atypical CP--stress test, cor angio, and CT chest negative in 2005. Nuclear study normal in 2011   COPD (chronic obstructive pulmonary disease) (HCC)   Diabetes (HCC)   Diabetes type 2- 1/13- metformin started  Dyslipidemia   Endometriosis  with ovarian radiation in 1966, gyn Dr cousins  GERD (gastroesophageal reflux disease)   H/H  Herpes   herpes of the left eye, Dr gust optho  History of DVT (deep vein thrombosis)   on OCP  History of kidney stones   Hypertension   Mild anemia   WBC mild high, lab 2012/ HEMATOLOGY   Osteoarthritis   Osteoarthritis of the hip, knee, and hand, vicodin , prn  Osteopenia   BD in 2013  Tobacco use   quit 03/2008  Vitamin D deficiency  Past Surgical History: Past Surgical History: Procedure Laterality Date  APPENDECTOMY    BALLOON DILATION N/A 07/09/2016  Procedure: BALLOON DILATION;  Surgeon: Gladis MARLA Louder, MD;  Location: [facility];  Service: Endoscopy;  Laterality: N/A;  COLONOSCOPY WITH PROPOFOL  N/A 07/09/2016  Procedure: COLONOSCOPY WITH PROPOFOL ;  Surgeon: Gladis MARLA Louder, MD;  Location: [facility];  Service: Endoscopy;  Laterality: N/A;  ESOPHAGOGASTRODUODENOSCOPY N/A 07/09/2016  Procedure: ESOPHAGOGASTRODUODENOSCOPY (EGD);  Surgeon:  Gladis MARLA Louder, MD;  Location: [facility];  Service: Endoscopy;  Laterality: N/A;  EYE SURGERY    left eye cataract srugery   HERNIA REPAIR    surgery fro endometriosis    TONSILLECTOMY   Norleen IVAR Blase, MA, CCC-SLP Speech Therapy   CT Head Wo Contrast Result Date: 02/02/2024 CLINICAL DATA:  Sinusitis, acute, orbital or intracranial complications suspected headache, paresthesias EXAM: CT HEAD WITHOUT CONTRAST TECHNIQUE: Contiguous axial images were obtained from the base of the skull through the vertex without intravenous contrast. RADIATION DOSE REDUCTION: This exam was performed according to the departmental dose-optimization program which includes automated exposure control, adjustment of the mA and/or kV according to patient size and/or use of iterative reconstruction technique. COMPARISON:  None Available. FINDINGS: Brain: Normal anatomic configuration. Parenchymal volume loss is commensurate with the patient's age. Moderate periventricular white matter changes are present likely reflecting the sequela of small vessel ischemia. No abnormal intra or extra-axial mass lesion or fluid collection. No abnormal mass effect or midline shift. No evidence of acute intracranial hemorrhage or infarct. Ventricular size is normal. Cerebellum unremarkable. Vascular: No asymmetric hyperdense vasculature at the skull base. Skull: Intact Sinuses/Orbits: Paranasal sinuses are clear. Orbits are unremarkable. Other: Fluid opacification of several right mastoid air cells without associated osseous erosion. Left mastoid air cells and middle ear cavities are clear. IMPRESSION: 1. No evidence of acute intracranial hemorrhage or infarct. 2. Moderate periventricular white matter changes likely reflecting the sequela of small vessel ischemia. 3. Right mastoid effusion. 4. Paranasal sinuses are clear. Electronically Signed   By: Dorethia Molt M.D.   On: 02/02/2024 21:01   DG Chest 2 View Result Date: 02/02/2024 CLINICAL DATA:   Chest pain and shortness of breath EXAM: CHEST - 2 VIEW COMPARISON:  Chest radiograph dated 10/03/2020 FINDINGS: Patient is rotated to the right. Low lung volumes with bronchovascular crowding. Increased diffuse interstitial opacities, right-greater-than-left. No pleural effusion or pneumothorax. Enlarged cardiomediastinal silhouette. No acute osseous abnormality. IMPRESSION: 1. Increased diffuse interstitial opacities, right-greater-than-left, which may represent pulmonary edema or atypical infection. 2. Cardiomegaly. Electronically Signed   By: Limin  Xu M.D.   On: 02/02/2024 16:45    Labs:  CBC: Recent Labs    02/10/24 0450 02/11/24 0452 02/14/24 0440 02/17/24 0457  WBC 12.5* 10.0 10.3 12.2*  HGB 11.8* 11.3* 11.7* 12.1  HCT 40.5 40.0 39.6 42.5  PLT 196 185 173 183    COAGS: No results for input(s): INR, APTT in the last 8760 hours.  BMP: Recent Labs  02/13/24 0340 02/14/24 0440 02/15/24 0337 02/17/24 0457  NA 148* 147* 146* 145  K 3.5 3.7 3.7 4.3  CL 115* 114* 105 104  CO2 27 27 32 32  GLUCOSE 188* 248* 316* 388*  BUN 31* 33* 41* 44*  CALCIUM  8.9 9.0 9.1 9.0  CREATININE 0.71 0.91 1.11* 1.02*  GFRNONAA >60 >60 50* 55*    LIVER FUNCTION TESTS: Recent Labs    02/03/24 0418 02/09/24 0452 02/10/24 0450 02/17/24 0457  BILITOT 0.6 0.9 0.6 0.6  AST 14* 25 27 12*  ALT 8 13 11 10   ALKPHOS 98 75 70 80  PROT 6.9 6.4* 6.0* 6.1*  ALBUMIN 3.2* 3.0* 2.9* 2.9*    TUMOR MARKERS: No results for input(s): AFPTM, CEA, CA199, CHROMGRNA in the last 8760 hours.  Assessment and Plan:  Request for  image guided gastrostomy tube placement approved by Dr. Jenna for 02/20/24. No contraindications for procedure identified in ROS, physical exam, or review of pre-sedation considerations. No concerns with currently available labs, will check CBC, INR morning of procedure 7/3 CT abd imaging available and reviewed VSS, afebrile Plavix  hold started 7/6, will keep held  until procedure. Lovenox  dose night before procedure held.  Tube feedings to be held/NPO MN prior to Friday procedure Abx: ancef  2 g prophylaxis day of procedure    Risks and benefits image guided gastrostomy tube placement was discussed with the patient including, but not limited to the need for a barium enema during the procedure, bleeding, infection, peritonitis and/or damage to adjacent structures.  All of the patient's questions were answered, patient is agreeable to proceed.  Consent signed and in chart.   Thank you for allowing our service to participate in Christina Bennett 's care.    Electronically Signed: Laymon Coast, NP   02/18/2024, 9:40 AM     I spent a total of 20 Minutes    in face to face in clinical consultation, greater than 50% of which was counseling/coordinating care for image guided gastrostomy tube placement   (A copy of this note was sent to the referring provider and the time of visit.)

## 2024-02-18 NOTE — Plan of Care (Signed)
  Problem: Fluid Volume: Goal: Ability to maintain a balanced intake and output will improve Outcome: Progressing   Problem: Education: Goal: Ability to describe self-care measures that may prevent or decrease complications (Diabetes Survival Skills Education) will improve Outcome: Progressing

## 2024-02-19 ENCOUNTER — Inpatient Hospital Stay (HOSPITAL_COMMUNITY)

## 2024-02-19 DIAGNOSIS — N1831 Chronic kidney disease, stage 3a: Secondary | ICD-10-CM | POA: Diagnosis not present

## 2024-02-19 DIAGNOSIS — I639 Cerebral infarction, unspecified: Secondary | ICD-10-CM | POA: Diagnosis not present

## 2024-02-19 DIAGNOSIS — I10 Essential (primary) hypertension: Secondary | ICD-10-CM | POA: Diagnosis not present

## 2024-02-19 DIAGNOSIS — E1169 Type 2 diabetes mellitus with other specified complication: Secondary | ICD-10-CM

## 2024-02-19 DIAGNOSIS — I5033 Acute on chronic diastolic (congestive) heart failure: Secondary | ICD-10-CM | POA: Diagnosis not present

## 2024-02-19 LAB — GLUCOSE, CAPILLARY
Glucose-Capillary: 228 mg/dL — ABNORMAL HIGH (ref 70–99)
Glucose-Capillary: 230 mg/dL — ABNORMAL HIGH (ref 70–99)
Glucose-Capillary: 224 mg/dL — ABNORMAL HIGH (ref 70–99)
Glucose-Capillary: 231 mg/dL — ABNORMAL HIGH (ref 70–99)

## 2024-02-19 MED ORDER — HYDROXYZINE HCL 10 MG PO TABS
10.0000 mg | ORAL_TABLET | Freq: Three times a day (TID) | ORAL | Status: DC | PRN
  Administered 2024-02-21 – 2024-02-25 (×11): 10 mg via ORAL
  Filled 2024-02-19 (×11): qty 1

## 2024-02-19 NOTE — Progress Notes (Addendum)
 Progress Note   Patient: Christina Bennett FMW:[id number] DOB: Jun 20, 1941 DOA: 02/02/2024     16 DOS: the patient was seen and examined on 02/19/2024   Brief hospital course: Christina Bennett was admitted to the hospital with the working diagnosis of altered mental status in the setting of acute stroke.   83 y.o. F with COPD, obesity, CAD, CKD IIIa who presented with dizziness, blurry vision, headache, chest congestion, and speech disturbances. EMS was called and patient was brought the the hospital.  On her initial physical examination her blood pressure was 122/63, HR 80, RR 19 and 02 saturation 95%.  Lungs with no wheezing or rhonchi, heart with S1 and S2 present and regular, abdomen with no distention and no lower extremity edema.   Chest radiograph with hypoinflation, mild cardiomegaly with bilateral basal atelectasis.    Was agitated and confused in the ED received multiple medications including Ativan , droperidol , Geodon , Haldol    After admission, she developed acute left sided weakness overnight, CODE STROKE called, given TNK and transferred to ICU.  6/26 CT head with no acute intracranial abnormality, moderate microvascular changes, remote lacunar infarct 6/26 CT angio head and neck with contrast, focal severe proximal right P2 stenosis, moderate stenosis at the origin of the vertebral artery  MRI showed right pontine infarct, small vessel disease given risk factor.  06/27 neurology sign off, recommended continue aspirin and clopidogrel .  06/28 transfer to [facility].  07/10 patient continue very weak and deconditioned, a cortrak was placed for nutritional support. Plan for PEG tube tomorrow, and then transfer to SNF when bed available.   Assessment and Plan: * CVA (cerebral vascular accident) (HCC) MRI brain showed small pontine infarct. Post-stroke course complicated by dysphagia. Non-invasive angiography showed focal severe right P2 stenosis and moderate vertebral right stenosis.  Echocardiogram showed no cardiogenic source of embolism. Carotid imaging unremarkable.   Plan to continue medical therapy with statin and clopidogrel , she is allergic to aspirin.  PT recommends SNF, placement pending   Acute metabolic encephalopathy, multifactorial, clinically has improved, and back to baseline, encephalopathy has resolved.  She had intermittent delirium now resolved.   Acute on chronic diastolic CHF (congestive heart failure) (HCC) Echocardiogram with preserved LV systolic function with EF 55 to 60%, RV systolic function preserved, normal size left and right atriums, no significant valvular disease.   Patient has been diuresed with furosemide  Currently with euvolemic state.  Holding diuretic therapy  Continue losartan  for afterload reduction.   Acute hypoxemic respiratory failure due to acute cardiogenic pulmonary edema has resolved.  Ruled out for pneumonia.   CKD stage 3a, GFR 45-59 ml/min (HCC) AKI.   Renal function has improved, continue close monitoring, avoid hypotension or nephrotoxic medications.  Check renal function and electrolytes in am.   Essential hypertension Continue blood pressure monitoring  Continue blood pressure control with losartan .   History of CAD (coronary artery disease) Continue with antiplatelet therapy   Iron deficiency anemia Hgb stable  COPD (chronic obstructive pulmonary disease) (HCC) No acute exacerbation.  Continue with bronchodilator therapy   Type 2 diabetes mellitus with hyperlipidemia (HCC) Continue glucose cover and monitoring with insulin  sliding scale.  Hyperglycemia, on basal insulin  Continue with tube feedings.  Patient will be NPO after midnight for PEG tube will plan to further adjust insulin  after procedure.   Continue with statin therapy  Malnutrition of moderate degree Continue nutritional supplements.  Currently receiving tube feedings.   Obesity, class 1 Calculated BMI 34.6   Dysphagia Has a  history  of esophageal stricture.  Since her stroke, she has had marked oropharyngeal dysphagia.       Subjective: Patient is tolerating well tube feedings, she has been complaining of thirst, intermittent agitation controlled with hydroxyzine    Physical Exam: Vitals:   02/19/24 0001 02/19/24 0352 02/19/24 0950 02/19/24 1213  BP: (!) 152/71 (!) 126/48 (!) 135/52 (!) 155/69  Pulse: 82 74 81 84  Resp: 18 16 20 16   Temp: 97.6 F (36.4 C) 97.8 F (36.6 C) (!) 97.2 F (36.2 C)   TempSrc: Oral Oral Oral   SpO2: 96% 94% 99% 92%  Weight:      Height:       Neurology awake and alert. Deconditioned ENT with mild pallor, positive cortrack in place.  Cardiovascular with S1 and S2 present and regular with no gallops, rubs or murmurs No JVD Respiratory with no rales or wheezing, poor inspiratory effort Abdomen with no distention  No lower extremity edema   Data Reviewed:    Family Communication: no family at the bedside   Disposition: Status is: Inpatient Remains inpatient appropriate because: pending PEG tube and then transfer to SNF   Planned Discharge Destination: Skilled nursing facility     Author: Elidia Toribio Furnace, MD 02/19/2024 2:08 PM  For on call review [email or web address].

## 2024-02-19 NOTE — Progress Notes (Signed)
 Speech Language Pathology Treatment: Dysphagia  Patient Details Name: Christina Bennett MRN: [id number] DOB: May 30, 1941 Today's Date: 02/19/2024 Time: 9244-9180 SLP Time Calculation (min) (ACUTE ONLY): 24 min  Assessment / Plan / Recommendation Clinical Impression  Patient seen for skilled SLP for MBS readiness. Pt awake, reports throat pain - RN made aware via secure chat.  Another RN on the floor reports RN in another room handling an emergent situation.    Pt very xerostomic - reports she has not used her upper denture due to weight loss but routinely used it prior to admission.  Mouth care provided using toothbrush/paste and oral suction with SLP provided further thorough care.  Removal of viscous secretions conducted - and SLP obtained denture adhesive with placement for MBS today.  Pt swallowed single ice chips - after allowance of melting - no S/S of aspiration.   Juliene arrived during the session and provided verbal support to pt.    MBS planned for 0845, full report to follow.  Pt agreeable to plan.  Concern for discomfort negatively impacting her swallowing/intake but she also endorses that her diet restriction is large factor.      HPI HPI: Patient is an 83 y.o. female with PMH: CAD, COPD, essential HTN, CKD stage III, morbid obesity. She presented to the Ed on 02/03/24 with multiple complaints inclucding generalized weakeness, HA, poor appetite, chest congestion, blurry vision and family reporting change in her speech. ED evaluation found her to be hemodynamically stable but with BP up to 183/86, UA showed evidence of UTI. CT head negative for acute intracranial abnormality or hemorrhage but did show moderate periventricular WM changes likely reflecting the sequela of small vessel ischemia. CXR showed diffuse interstitial opacities, R>L. While in ED, patient became very restless, anxious and fidgety. Despite multiple doses of Ativan  to attempt MRI,, it was canceled due to patient not  cooperating. Non-violent restraints placed. SLP swallow evalauation ordered secondary to family reports of swallowing difficulities as well as RN observing patient to not be able to swallow even smaller pills, and coughing with liquids.  MBSS 6/25 with recs for D3/NTL. Pt with code stroke overnight and reassessed 6/26 with change in swallow function.  PEG planned 7/11 due to pt's poor po.  MBS indicated to assess readiness for dietary advancement.      SLP Plan  MBS (today at 0845)          Recommendations  Diet recommendations: Dysphagia 1 (puree);Honey-thick liquid;Other(comment) (ice chips) Liquids provided via: Cup;Teaspoon;No straw Compensations: Slow rate;Small sips/bites;Effortful swallow Postural Changes and/or Swallow Maneuvers: Seated upright 90 degrees;Upright 30-60 min after meal                        Dysphagia, oropharyngeal phase (R13.12)     MBS (today at 0845)   Christina POUR, MS [name] SLP Acute Rehab Services Office [phone]   Nicolas Christina Bennett  02/19/2024, 8:31 AM

## 2024-02-19 NOTE — TOC Progression Note (Signed)
 Transition of Care [facility]) - Progression Note    Patient Details  Name: Christina Bennett MRN: [id number] Date of Birth: 20-Jan-1941  Transition of [facility]) CM/SW Contact  [name], [name] Phone Number: 02/19/2024, 2:09 PM  Clinical Narrative:   CSW spoke with patient's niece, [name], and provided update that [facility] has declined. Niece disappointed and discussed other places that they had reviewed. Second choice at this time would be [facility], and they are still looking at other options. CSW to follow.    Expected Discharge Plan: Skilled Nursing Facility Barriers to Discharge: Continued Medical Work up  Expected Discharge Plan and Services In-house Referral: Clinical Social Work     Living arrangements for the past 2 months: Single Family Home                                       Social Determinants of Health (SDOH) Interventions SDOH Screenings   Food Insecurity: Patient Unable To Answer (02/03/2024)  Housing: Unknown (02/03/2024)  Transportation Needs: Patient Unable To Answer (02/03/2024)  Utilities: Patient Unable To Answer (02/03/2024)  Social Connections: Patient Unable To Answer (02/03/2024)  Tobacco Use: Medium Risk (02/03/2024)    Readmission Risk Interventions     No data to display

## 2024-02-19 NOTE — Progress Notes (Addendum)
 Speech Language Pathology Treatment: Dysphagia  Patient Details Name: Christina Bennett MRN: [id number] DOB: 12-01-1940 Today's Date: 02/19/2024 Time: 8960-8950 SLP Time Calculation (min) (ACUTE ONLY): 10 min  Assessment / Plan / Recommendation Clinical Impression  Pt seen for skilled SlP for dysphagia management, Reviewed MBS with her using teach back for swallow precautions.  Pt wants ice and was provided with single ice chips as well as gingerale nectar thick.   Wincing with swallowing observed - especially with gingerale *which pt wanted* but no s/s of aspiration.  Fortunately pt did cough with aspiration on MBS.  She continues to report severe odynophagia which she attributes to her feeding tube.  Note plan tomorrow to get PEG for nutrition -  Suspect her dysphagia, fatigue and odynophagia are contributing greatly to her poor po but also ? If pt is sad with her current medical situation.  Pt consumed single ice chips during the session - allowing them to melt prior to swallowing and without s/s of aspiration.  She declined [place] Ice options provided by SLP.     Using teach back, pt demonstrated chin tuck posture but will continue to require full assist with meals.  Pons and basal ganglia CVA source of dysphagia.      HPI HPI: Patient is an 83 y.o. female with PMH: CAD, COPD, essential HTN, CKD stage III, morbid obesity. She presented to the Ed on 02/03/24 with multiple complaints inclucding generalized weakeness, HA, poor appetite, chest congestion, blurry vision and family reporting change in her speech. ED evaluation found her to be hemodynamically stable but with BP up to 183/86, UA showed evidence of UTI. CT head negative for acute intracranial abnormality or hemorrhage but did show moderate periventricular WM changes likely reflecting the sequela of small vessel ischemia. CXR showed diffuse interstitial opacities, R>L. While in ED, patient became very restless, anxious and fidgety. Despite  multiple doses of Ativan  to attempt MRI,, it was canceled due to patient not cooperating. Non-violent restraints placed. SLP swallow evalauation ordered secondary to family reports of swallowing difficulities as well as RN observing patient to not be able to swallow even smaller pills, and coughing with liquids.  MBSS 6/25 with recs for D3/NTL. Pt with code stroke overnight and reassessed 6/26 with change in swallow function.  PEG planned 7/11 due to pt's poor po.  MBS indicated to assess readiness for dietary advancement.      SLP Plan  Continue with current plan of care          Recommendations  Diet recommendations: Dysphagia 1 (puree);Nectar-thick liquid;Other(comment) (ice chips/popsicles ok) Liquids provided via: Straw (straw with chin tuck) Medication Administration: Crushed with puree Compensations: Slow rate;Small sips/bites;Other (Comment);Use straw to facilitate chin tuck;Chin tuck (start all intake with liquids, tuck chin with EVERY swallow, cough and expectorate) Postural Changes and/or Swallow Maneuvers: Seated upright 90 degrees;Upright 30-60 min after meal;Chin tuck                  Oral care QID   Frequent or constant Supervision/Assistance Dysphagia, oropharyngeal phase (R13.12)     Continue with current plan of care   [name], MS [facility] SLP Acute Rehab Services Office [phone]   [name]  02/19/2024, 10:53 AM

## 2024-02-19 NOTE — Progress Notes (Signed)
 Physical Therapy Treatment Patient Details Name: Christina Bennett MRN: [id number] DOB: [birth year] Today's Date: 02/19/2024   History of Present Illness The pt is an 83 yo female presenting 6/23 with AMS. Initial work up with concern for UTI, pt given antibiotics and returned to baseline. Code stroke called 1AM 6/26, pt given TNK and transferred to ICU. 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 PMH includes: COPD, DM II, DVT, HTN, arthritis.    PT Comments  Pt received in supine and agreeable to session. Pt able to perform bed mobility and transfers with mod-max A +2 due to weakness. Pt requires step by step cues for sequencing throughout. Pt able to tolerate a few side steps with max A +2 for balance and LLE advancement. Pt demonstrates good participation, but is limited by fatigue. Pt continues to benefit from PT services to progress toward functional mobility goals.    If plan is discharge home, recommend the following: Two people to help with walking and/or transfers;Two people to help with bathing/dressing/bathroom;Assistance with cooking/housework;Direct supervision/assist for medications management;Direct supervision/assist for financial management;Assist for transportation;Help with stairs or ramp for entrance   Can travel by private vehicle     No  Equipment Recommendations  Wheelchair (measurements PT);Wheelchair cushion (measurements PT);Hoyer lift    Recommendations for [employer]       Precautions / Restrictions Precautions Precautions: Fall Recall of Precautions/Restrictions: Impaired Precaution/Restrictions Comments: SBP <180 Restrictions Weight Bearing Restrictions Per Provider Order: No     Mobility  Bed Mobility Overal bed mobility: Needs Assistance Bed Mobility: Supine to Sit, Sit to Supine     Supine to sit: Mod assist, +2 for physical assistance Sit to  supine: Max assist, +2 for physical assistance   General bed mobility comments: Pt able to advance BLE to EOB with min A and push up on rail, requiring mod A +2 for trunk elevation and scooting forward to EOB    Transfers Overall transfer level: Needs assistance Equipment used: 2 person hand held assist Transfers: Sit to/from Stand Sit to Stand: From elevated surface, Mod assist, +2 physical assistance           General transfer comment: STS from EOB with mod A +2 for power up and dense cues for upright posture. Pt able to take a few side steps towards HOB with max A +2 for weight shifting, L knee blocking, and LLE advancement    Ambulation/Gait               General Gait Details: pt unable   Stairs             Wheelchair Mobility     Tilt Bed    Modified Rankin (Stroke Patients Only) Modified Rankin (Stroke Patients Only) Pre-Morbid Rankin Score: No symptoms Modified Rankin: Severe disability     Balance Overall balance assessment: Needs assistance Sitting-balance support: Feet supported, Single extremity supported Sitting balance-Leahy Scale: Fair Sitting balance - Comments: sitting EOB with CGA   Standing balance support: Bilateral upper extremity supported, Reliant on assistive device for balance, During functional activity Standing balance-Leahy Scale: Poor Standing balance comment: reliant on therapist support                            Communication Communication Communication: No apparent difficulties Factors Affecting Communication: Reduced clarity of speech  Cognition Arousal: Alert Behavior During  Therapy: WFL for tasks assessed/performed   PT - Cognitive impairments: Sequencing, Problem solving, Initiation                         Following commands: Impaired Following commands impaired: Follows one step commands with increased time    Cueing Cueing Techniques: Verbal cues, Tactile cues  Exercises       General Comments        Pertinent Vitals/Pain Pain Assessment Pain Assessment: Faces Faces Pain Scale: Hurts little more Pain Location: headache Pain Descriptors / Indicators: Headache Pain Intervention(s): Monitored during session, Repositioned     PT Goals (current goals can now be found in the care plan section) Acute Rehab PT Goals Patient Stated Goal: return to living at her house PT Goal Formulation: With patient Time For Goal Achievement: 02/20/24 Progress towards PT goals: Progressing toward goals    Frequency    Min 2X/week       AM-PAC PT 6 Clicks Mobility   Outcome Measure  Help needed turning from your back to your side while in a flat bed without using bedrails?: A Lot Help needed moving from lying on your back to sitting on the side of a flat bed without using bedrails?: A Lot Help needed moving to and from a bed to a chair (including a wheelchair)?: Total Help needed standing up from a chair using your arms (e.g., wheelchair or bedside chair)?: Total Help needed to walk in hospital room?: Total Help needed climbing 3-5 steps with a railing? : Total 6 Click Score: 8    End of Session Equipment Utilized During Treatment: Gait belt Activity Tolerance: Patient tolerated treatment well;Patient limited by fatigue Patient left: in bed;with call bell/phone within reach;with bed alarm set Nurse Communication: Mobility status PT Visit Diagnosis: Unsteadiness on feet (R26.81);Muscle weakness (generalized) (M62.81);Hemiplegia and hemiparesis Hemiplegia - Right/Left: Left Hemiplegia - dominant/non-dominant: Non-dominant Hemiplegia - caused by: Cerebral infarction     Time: 1102-1120 PT Time Calculation (min) (ACUTE ONLY): 18 min  Charges:    $Therapeutic Activity: 8-22 mins PT General Charges $$ ACUTE PT VISIT: 1 Visit                     Darryle George, PTA Acute Rehabilitation Services Secure Chat Preferred  [phone]    Darryle George 02/19/2024, 12:49 PM

## 2024-02-19 NOTE — Procedures (Signed)
 Modified Barium Swallow Study  Patient Details  Name: Christina Bennett MRN: [id number] Date of Birth: 12-11-1940  Today's Date: 02/19/2024  Modified Barium Swallow completed.  Full report located under Chart Review in the Imaging Section.  History of Present Illness Patient is an 83 y.o. female with PMH: CAD, COPD, essential HTN, CKD stage III, morbid obesity. She presented to the Ed on 02/03/24 with multiple complaints inclucding generalized weakeness, HA, poor appetite, chest congestion, blurry vision and family reporting change in her speech. ED evaluation found her to be hemodynamically stable but with BP up to 183/86, UA showed evidence of UTI. CT head negative for acute intracranial abnormality or hemorrhage but did show moderate periventricular WM changes likely reflecting the sequela of small vessel ischemia. CXR showed diffuse interstitial opacities, R>L. While in ED, patient became very restless, anxious and fidgety. Despite multiple doses of Ativan  to attempt MRI,, it was canceled due to patient not cooperating. Non-violent restraints placed. SLP swallow evalauation ordered secondary to family reports of swallowing difficulities as well as RN observing patient to not be able to swallow even smaller pills, and coughing with liquids.  MBSS 6/25 with recs for D3/NTL. Pt with code stroke overnight and reassessed 6/26 with change in swallow function.  PEG planned 7/11 due to pt's poor po.  MBS indicated to assess readiness for dietary advancement.   Clinical Impression Patient continues with moderately severe oropharyngeal dysphagia with ongoing sensorimotor deficits.  Her Cortrak feeding tube worsened her pharngeal retention compared to most recent MBS when she did not have a tube.   She continues with slower lingual transit and mastication with less lingual control and more oral residue.  She expectorated poorly masticated fig bar later in the MBS after she failed to swallow it despite being  offered puree to aid oral transiting.  Pt with premature spillage of barium into pharynx poorly controlled.    Trigger of swallow with thin with chin tuck was in larynx resulting in aspiration (in posterior trach) that produced severe cough response (PAS 7).  Nectar thick liquids with head neutral were also aspirated into posterior trach as boluses spilled over epiglottis into open larynx and did not clear with reflexive coughing (PAS 7).  Improved suction via straw noted with nectar liquids allowing nectar liquids to be consumed without aspiration with chin tuck posture.  Laryngeal vestibule closure and epiglottic inversion remain incomplete.  Pt benefied from verbal cues and counting 1,2,3,.... to improve timing of swallow initiation especially with solid.    Pt had no aspiration with nectar with chin tuck via straw, honey thick liquids or solids.  She does appear to have secretions retained in pharynx that mix with retention. Recommend she continue creamy puree/nectar thick liquids via straw with chin tuck posture. Single ice chips and popsicles ok any time - with full supervision.   Head reclined w/ tsp of thin was not aspirated and may be feasible for between meals AFTER pt has her PEG placed and with SLP ONLY initially.        Pt fatigued during testing and said I don't want to do this anymore and this fatigue/frustration is likely contributing to her poor po intake in addition to her displeasure with current diet regimen.     Recommend she have follow up with SLP to aggressively treat her dysphagia after she receives her PEG for nutrition. Of note, denture in place did not improve pt's mastication ability.  Factors that may increase risk of adverse event in  presence of aspiration Noe & Lianne 2021): Frequent aspiration of large volumes;Aspiration of thick, dense, and/or acidic materials;Weak cough;Reduced cognitive function;Dependence for feeding and/or oral hygiene  Swallow Evaluation  Recommendations Recommendations: PO diet PO Diet Recommendation: Moderately thick liquids (Level 2 Nectar;Dysphagia 1 (Pureed) (family can bring pt soft snacks when visit for po), ICE CHIPS OK, Popsicle OK Liquid Administration via: STRAW; tsp Medication Administration: Crushed with puree- start and follow with liquids Supervision: Staff to assist with self-feeding;Full supervision/cueing for swallowing strategies Swallowing strategies  : Slow rate;Small bites/sips;Chin tuck, use straw for chin tuck  Postural changes: Position pt fully upright for meals;Stay upright 30-60 min after meals Oral care recommendations: Oral care before ice chips/water  Caregiver Recommendations: Remove water  pitcher;Have oral suction available (popsicles ok)    [name], MS [facility] SLP Acute Rehab Services Office [phone]   [name] 02/19/2024,10:05 AM

## 2024-02-19 NOTE — Plan of Care (Signed)
  Problem: Education: Goal: Ability to describe self-care measures that may prevent or decrease complications (Diabetes Survival Skills Education) will improve Outcome: Progressing Goal: Individualized Educational Video(s) Outcome: Progressing   Problem: Coping: Goal: Ability to adjust to condition or change in health will improve Outcome: Progressing   Problem: Fluid Volume: Goal: Ability to maintain a balanced intake and output will improve Outcome: Progressing   Problem: Health Behavior/Discharge Planning: Goal: Ability to identify and utilize available resources and services will improve Outcome: Progressing Goal: Ability to manage health-related needs will improve Outcome: Progressing   Problem: Metabolic: Goal: Ability to maintain appropriate glucose levels will improve Outcome: Progressing   Problem: Nutritional: Goal: Maintenance of adequate nutrition will improve Outcome: Progressing Goal: Progress toward achieving an optimal weight will improve Outcome: Progressing   Problem: Skin Integrity: Goal: Risk for impaired skin integrity will decrease Outcome: Progressing   Problem: Tissue Perfusion: Goal: Adequacy of tissue perfusion will improve Outcome: Progressing   Problem: Education: Goal: Knowledge of General Education information will improve Description: Including pain rating scale, medication(s)/side effects and non-pharmacologic comfort measures Outcome: Progressing   Problem: Health Behavior/Discharge Planning: Goal: Ability to manage health-related needs will improve Outcome: Progressing   Problem: Clinical Measurements: Goal: Ability to maintain clinical measurements within normal limits will improve Outcome: Progressing Goal: Will remain free from infection Outcome: Progressing Goal: Diagnostic test results will improve Outcome: Progressing Goal: Respiratory complications will improve Outcome: Progressing Goal: Cardiovascular complication will  be avoided Outcome: Progressing   Problem: Activity: Goal: Risk for activity intolerance will decrease Outcome: Progressing   Problem: Coping: Goal: Level of anxiety will decrease Outcome: Progressing   Problem: Elimination: Goal: Will not experience complications related to bowel motility Outcome: Progressing Goal: Will not experience complications related to urinary retention Outcome: Progressing   Problem: Pain Managment: Goal: General experience of comfort will improve and/or be controlled Outcome: Progressing

## 2024-02-20 ENCOUNTER — Inpatient Hospital Stay (HOSPITAL_COMMUNITY)

## 2024-02-20 DIAGNOSIS — R531 Weakness: Secondary | ICD-10-CM | POA: Diagnosis not present

## 2024-02-20 DIAGNOSIS — I639 Cerebral infarction, unspecified: Secondary | ICD-10-CM | POA: Diagnosis not present

## 2024-02-20 HISTORY — PX: IR GASTROSTOMY TUBE MOD SED: IMG625

## 2024-02-20 LAB — GLUCOSE, CAPILLARY
Glucose-Capillary: 205 mg/dL — ABNORMAL HIGH (ref 70–99)
Glucose-Capillary: 231 mg/dL — ABNORMAL HIGH (ref 70–99)
Glucose-Capillary: 182 mg/dL — ABNORMAL HIGH (ref 70–99)
Glucose-Capillary: 178 mg/dL — ABNORMAL HIGH (ref 70–99)

## 2024-02-20 LAB — CBC
HCT: 42.8 % (ref 36.0–46.0)
Hemoglobin: 12.5 g/dL (ref 12.0–15.0)
MCH: 25.6 pg — ABNORMAL LOW (ref 26.0–34.0)
MCHC: 29.2 g/dL — ABNORMAL LOW (ref 30.0–36.0)
MCV: 87.7 fL (ref 80.0–100.0)
Platelets: 150 K/uL (ref 150–400)
RBC: 4.88 MIL/uL (ref 3.87–5.11)
RDW: 18.4 % — ABNORMAL HIGH (ref 11.5–15.5)
WBC: 12.2 K/uL — ABNORMAL HIGH (ref 4.0–10.5)
nRBC: 0 % (ref 0.0–0.2)

## 2024-02-20 LAB — PROTIME-INR
INR: 1.1 (ref 0.8–1.2)
Prothrombin Time: 14.4 s (ref 11.4–15.2)

## 2024-02-20 LAB — BASIC METABOLIC PANEL WITH GFR
Anion gap: 12 (ref 5–15)
BUN: 41 mg/dL — ABNORMAL HIGH (ref 8–23)
CO2: 29 mmol/L (ref 22–32)
Calcium: 9.2 mg/dL (ref 8.9–10.3)
Chloride: 104 mmol/L (ref 98–111)
Creatinine, Ser: 0.87 mg/dL (ref 0.44–1.00)
GFR, Estimated: >60 mL/min (ref >60)
Glucose, Bld: 212 mg/dL — ABNORMAL HIGH (ref 70–99)
Potassium: 4.7 mmol/L (ref 3.5–5.1)
Sodium: 145 mmol/L (ref 135–145)

## 2024-02-20 MED ORDER — IOHEXOL 300 MG/ML  SOLN: 50.0000 mL | Freq: Once | INTRAMUSCULAR | Status: DC | PRN

## 2024-02-20 MED ORDER — MIDAZOLAM HCL 2 MG/2ML IJ SOLN
INTRAMUSCULAR | Status: AC
  Filled 2024-02-20: qty 2

## 2024-02-20 MED ORDER — MIDAZOLAM HCL 2 MG/2ML IJ SOLN
INTRAMUSCULAR | Status: AC | PRN
Start: 2024-02-20 — End: 2024-02-20
  Administered 2024-02-20 (×3): .5 mg via INTRAVENOUS

## 2024-02-20 MED ORDER — LIDOCAINE-EPINEPHRINE 1 %-1:100000 IJ SOLN
20.0000 mL | Freq: Once | INTRAMUSCULAR | Status: AC
  Administered 2024-02-20: 10 mL via INTRADERMAL
  Filled 2024-02-20: qty 20

## 2024-02-20 MED ORDER — GLUCAGON HCL RDNA (DIAGNOSTIC) 1 MG IJ SOLR
INTRAMUSCULAR | Status: AC
  Filled 2024-02-20: qty 1

## 2024-02-20 MED ORDER — IPRATROPIUM-ALBUTEROL 0.5-2.5 (3) MG/3ML IN SOLN
3.0000 mL | Freq: Four times a day (QID) | RESPIRATORY_TRACT | Status: DC | PRN
  Filled 2024-02-20: qty 3

## 2024-02-20 MED ORDER — LIDOCAINE-EPINEPHRINE 1 %-1:100000 IJ SOLN
INTRAMUSCULAR | Status: AC
  Filled 2024-02-20: qty 1

## 2024-02-20 MED ORDER — CEFAZOLIN SODIUM-DEXTROSE 2-4 GM/100ML-% IV SOLN
INTRAVENOUS | Status: AC
Start: 2024-02-20 — End: 2024-02-20
  Filled 2024-02-20: qty 100

## 2024-02-20 MED ORDER — GLUCAGON HCL (RDNA) 1 MG IJ SOLR
INTRAMUSCULAR | Status: AC | PRN
Start: 2024-02-20 — End: 2024-02-20
  Administered 2024-02-20: 1 mg via INTRAVENOUS

## 2024-02-20 MED ORDER — TRIPLE ANTIBIOTIC 3.5-400-5000 EX OINT
1.0000 | TOPICAL_OINTMENT | Freq: Every day | CUTANEOUS | Status: DC
  Administered 2024-02-20 – 2024-02-25 (×6): 1 via TOPICAL
  Filled 2024-02-20 (×7): qty 1

## 2024-02-20 MED ORDER — FENTANYL CITRATE (PF) 100 MCG/2ML IJ SOLN
INTRAMUSCULAR | Status: AC
  Filled 2024-02-20: qty 2

## 2024-02-20 MED ORDER — ARFORMOTEROL TARTRATE 15 MCG/2ML IN NEBU
15.0000 ug | INHALATION_SOLUTION | Freq: Two times a day (BID) | RESPIRATORY_TRACT | Status: DC
  Administered 2024-02-20 – 2024-02-25 (×11): 15 ug via RESPIRATORY_TRACT
  Filled 2024-02-20 (×11): qty 2

## 2024-02-20 MED ORDER — IPRATROPIUM-ALBUTEROL 0.5-2.5 (3) MG/3ML IN SOLN
3.0000 mL | Freq: Four times a day (QID) | RESPIRATORY_TRACT | Status: DC
  Administered 2024-02-20 (×2): 3 mL via RESPIRATORY_TRACT
  Filled 2024-02-20 (×2): qty 3

## 2024-02-20 MED ORDER — BUDESONIDE 0.25 MG/2ML IN SUSP
0.2500 mg | Freq: Two times a day (BID) | RESPIRATORY_TRACT | Status: DC
  Administered 2024-02-20 – 2024-02-25 (×11): 0.25 mg via RESPIRATORY_TRACT
  Filled 2024-02-20 (×11): qty 2

## 2024-02-20 MED ORDER — FUROSEMIDE 20 MG PO TABS: 20.0000 mg | ORAL_TABLET | Freq: Every day | ORAL | Status: DC

## 2024-02-20 MED ORDER — FENTANYL CITRATE (PF) 100 MCG/2ML IJ SOLN
INTRAMUSCULAR | Status: AC | PRN
  Administered 2024-02-20 (×2): 25 ug via INTRAVENOUS

## 2024-02-20 NOTE — TOC Progression Note (Signed)
 Transition of Care [facility]) - Progression Note    Patient Details  Name: Christina Bennett MRN: [id number] Date of Birth: April 21, 1941  [facility] CM/SW Contact  [name], [name] Phone Number: 02/20/2024, 2:51 PM  Clinical Narrative:   CSW met with patient's niece, [name], to obtain copies of patient's Living Will and HCPOA documents. Copies placed in patient's chart. Patient received peg tube today. Family continues to prefer [facility] if a bed opens up, or [facility] for SNF. CSW to follow.    Expected Discharge Plan: Skilled Nursing Facility Barriers to Discharge: Continued Medical Work up, English as a second language teacher  Expected Discharge Plan and Services In-house Referral: Clinical Social Work     Living arrangements for the past 2 months: Single Family Home                                       Social Determinants of Health (SDOH) Interventions SDOH Screenings   Food Insecurity: Patient Unable To Answer (02/03/2024)  Housing: Unknown (02/03/2024)  Transportation Needs: Patient Unable To Answer (02/03/2024)  Utilities: Patient Unable To Answer (02/03/2024)  Social Connections: Patient Unable To Answer (02/03/2024)  Tobacco Use: Medium Risk (02/03/2024)    Readmission Risk Interventions     No data to display

## 2024-02-20 NOTE — Progress Notes (Signed)
 PROGRESS NOTE    Christina Bennett  FMW:[id number] DOB: [birth year] DOA: 02/02/2024 PCP: Ransom Other, MD   Brief Narrative: 83 year old with past medical history significant for COPD, obesity, CAD, CKD 3A who presents with dizziness, blurry vision, headache, chest congestion, speech disturbance.  EMS was called and patient was brought to the hospital on her initial physical examination blood pressure was 122/63.  Chest x-ray with hypoinflation and mild cardiomegaly.  Patient was agitated in the ED and received multiple medications Ativan , droperidol  Geodon  and Haldol .  After admission she developed acute left-sided weakness overnight and code stroke was called received TNK and transferred to ICU.  CT head no acute abnormality.  CT angio head severe proximal right P2 stenosis, moderate stenosis of the origin of the vertebral artery.  MRI showed right pontine infarct.  [facility].  As of 7/10 she is very weak and deconditioned, core track in place.   Assessment & Plan:   Principal Problem:   CVA (cerebral vascular accident) (HCC) Active Problems:   Acute on chronic diastolic CHF (congestive heart failure) (HCC)   CKD stage 3a, GFR 45-59 ml/min (HCC)   Essential hypertension   History of CAD (coronary artery disease)   Iron deficiency anemia   COPD (chronic obstructive pulmonary disease) (HCC)   Type 2 diabetes mellitus with hyperlipidemia (HCC)   Malnutrition of moderate degree   Obesity, class 1   Dysphagia  1-CVA (cerebral vascular accident) (HCC)  -MRI brain showed small pontine infarct. Post-stroke course complicated by dysphagia. Non-invasive angiography showed focal severe right P2 stenosis and moderate vertebral right stenosis.  -Echocardiogram showed no cardiogenic source of embolism. -Carotid imaging unremarkable.   -acute metabolic encephalopathy multifactorial, delirium.  MS improved.  -plan to continue with plavix  and statins.   Acute on chronic  diastolic heart failure Acute hypoxemic respiratory failure due to acute cardiogenic pulmonary edema has resolved.  Ruled out for pneumonia.  -ECHO Ef 55 % -She was diuresed.  -Lasix  has been on hold.  Will start low dose lasix  tomorrow  AKI CKD stage IIIa: Renal function improved.  Monitor.   Essential hypertension: Continue Losartan .   History of CAD: Continue with plavix   Iron deficiency anemia: Monitor hb.   COPD Change inhaler to nebulizer.   Diabetes type 2 with hyperglycemia SSI  Malnutrition of moderate degree Dysphagia Underwent peg tube placement 7/11 Also started on Dysphagia diet.   Obesity class I BMI 34   Nutrition Problem: Moderate Malnutrition Etiology: poor appetite (in the context of acute illness)    Signs/Symptoms: energy intake < 75% for > 7 days, mild muscle depletion, mild fat depletion    Interventions: Prostat, Tube feeding  Estimated body mass index is 35.48 kg/m as calculated from the following:   Height as of this encounter: 5' 2 (1.575 m).   Weight as of this encounter: 88 kg.   DVT prophylaxis: Lovenox  Code Status: DNR limited Family Communication: niece who was at bedside.  Disposition Plan:  Status is: Inpatient Remains inpatient appropriate because: management after stroke.     Consultants:  Neurology     Subjective: She is alert, she wants something to drink. Just came from peg placement. Per niece patient was able to get some ice yesterday.   Objective: Vitals:   02/20/24 0220 02/20/24 0350 02/20/24 0500 02/20/24 0616  BP: 138/61 (!) 151/63  (!) 151/63  Pulse:  72    Resp:      Temp:  (!) 97.5 F (36.4 C)  TempSrc:  Oral    SpO2:  97%    Weight:   88 kg   Height:        Intake/Output Summary (Last 24 hours) at 02/20/2024 0724 Last data filed at 02/19/2024 1655 Gross per 24 hour  Intake 40 ml  Output 650 ml  Net -610 ml   Filed Weights   02/16/24 0535 02/17/24 0500 02/20/24 0500  Weight:  85.4 kg 86 kg 88 kg    Examination:  General exam: Appears calm and comfortable  Respiratory system: Clear to auscultation. Respiratory effort normal. Cardiovascular system: S1 & S2 heard, RRR. No JVD, murmurs, rubs, gallops or clicks. No pedal edema. Gastrointestinal system: Abdomen is nondistended, soft and nontender. No organomegaly or masses felt. Normal bowel sounds heard. Central nervous system: Alert and oriented.  Extremities: Symmetric 5 x 5 power.    Data Reviewed: I have personally reviewed following labs and imaging studies  CBC: Recent Labs  Lab 02/14/24 0440 02/17/24 0457 02/20/24 0359  WBC 10.3 12.2* 12.2*  HGB 11.7* 12.1 12.5  HCT 39.6 42.5 42.8  MCV 88.2 88.9 87.7  PLT 173 183 150   Basic Metabolic Panel: Recent Labs  Lab 02/14/24 0440 02/15/24 0337 02/17/24 0457 02/20/24 0359  NA 147* 146* 145 145  K 3.7 3.7 4.3 4.7  CL 114* 105 104 104  CO2 27 32 32 29  GLUCOSE 248* 316* 388* 212*  BUN 33* 41* 44* 41*  CREATININE 0.91 1.11* 1.02* 0.87  CALCIUM  9.0 9.1 9.0 9.2  MG 2.0 1.9 2.0  --   PHOS 3.1 3.3  --   --    GFR: Estimated Creatinine Clearance: 52.3 mL/min (by C-G formula based on SCr of 0.87 mg/dL). Liver Function Tests: Recent Labs  Lab 02/17/24 0457  AST 12*  ALT 10  ALKPHOS 80  BILITOT 0.6  PROT 6.1*  ALBUMIN 2.9*   No results for input(s): LIPASE, AMYLASE in the last 168 hours. No results for input(s): AMMONIA in the last 168 hours. Coagulation Profile: Recent Labs  Lab 02/20/24 0359  INR 1.1   Cardiac Enzymes: No results for input(s): CKTOTAL, CKMB, CKMBINDEX, TROPONINI in the last 168 hours. BNP (last 3 results) No results for input(s): PROBNP in the last 8760 hours. HbA1C: No results for input(s): HGBA1C in the last 72 hours. CBG: Recent Labs  Lab 02/19/24 0608 02/19/24 1209 02/19/24 1648 02/19/24 2128 02/20/24 0606  GLUCAP 228* 230* 224* 231* 205*   Lipid Profile: No results for input(s):  CHOL, HDL, LDLCALC, TRIG, CHOLHDL, LDLDIRECT in the last 72 hours. Thyroid  Function Tests: No results for input(s): TSH, T4TOTAL, FREET4, T3FREE, THYROIDAB in the last 72 hours. Anemia Panel: No results for input(s): VITAMINB12, FOLATE, FERRITIN, TIBC, IRON, RETICCTPCT in the last 72 hours. Sepsis Labs: No results for input(s): PROCALCITON, LATICACIDVEN in the last 168 hours.  No results found for this or any previous visit (from the past 240 hours).       Radiology Studies: DG Swallowing Func-Speech Pathology Result Date: 02/19/2024 Table formatting from the original result was not included. Modified Barium Swallow Study Patient Details Name: Christina Bennett MRN: [id number] Date of Birth: 1941/02/25 Today's Date: 02/19/2024 HPI/PMH: HPI: Patient is an 83 y.o. female with PMH: CAD, COPD, essential HTN, CKD stage III, morbid obesity. She presented to the Ed on 02/03/24 with multiple complaints inclucding generalized weakeness, HA, poor appetite, chest congestion, blurry vision and family reporting change in her speech. ED evaluation found her to be hemodynamically stable  but with BP up to 183/86, UA showed evidence of UTI. CT head negative for acute intracranial abnormality or hemorrhage but did show moderate periventricular WM changes likely reflecting the sequela of small vessel ischemia. CXR showed diffuse interstitial opacities, R>L. While in ED, patient became very restless, anxious and fidgety. Despite multiple doses of Ativan  to attempt MRI,, it was canceled due to patient not cooperating. Non-violent restraints placed. SLP swallow evalauation ordered secondary to family reports of swallowing difficulities as well as RN observing patient to not be able to swallow even smaller pills, and coughing with liquids.  MBSS 6/25 with recs for D3/NTL. Pt with code stroke overnight and reassessed 6/26 with change in swallow function.  PEG planned 7/11 due to pt's poor  po.  MBS indicated to assess readiness for dietary advancement. Clinical Impression: Clinical Impression: Patient continues with moderately severe oropharyngeal dysphagia with ongoing sensorimotor deficits.  Her Cortrak feeding tube worsened her pharngeal retention compared to most recent MBS when she did not have a tube. She does not have sensation to pharyngeal retention during today's testing.   She continues with slower lingual transit and mastication with less lingual control and more oral residue.  She expectorated poorly masticated fig bar later in the MBS after she failed to swallow it despite being offered puree to aid oral transiting.  Pt with premature spillage of barium into pharynx poorly controlled.  Trigger of swallow with thin with chin tuck was in larynx resulting in aspiration that produced cough response (PAS 7).  Nectar thick liquids with head neutral were also aspirated into posterior trach as boluses spilled over epiglottis into open larynx and did not clear with reflexive coughing (PAS 7).  Improved suction via straw noted with nectar liquids allowing nectar liquids to be consumed without aspiration with chin tuck posture.  Laryngeal vestibule closure and epiglottic inversion remain incomplete.  Pt benefied from verbal cues and counting 1,2,3,.... to improve timing of swallow initiation especially with solid.  Pt had no aspiration with nectar with chin tuck via straw, honey thick liquids or solids.  She does appear to have secretions retained in pharynx that mix with retention. Recommend she continue creamy puree/nectar thick liquids via straw with chin tuck posture. Single ice chips ok any time - with full supervision.  Head reclined to tsp of thin was not aspirated and may be feasible for between meals AFTER pt has her PEG placed and with SLP ONLY initially.   Pt fatigued during testing and said I don't want to do this anymore and this fatigue/frustration is likely contributing to her poor  po intake in addition to her displeasure with current diet regimen.   Recommend she have follow up with SLP to aggressively treat her dysphagia after she receives her PEG for nutrition. Of note, denture in place did not improve pt's mastication ability. Factors that may increase risk of adverse event in presence of aspiration Noe & Lianne 2021): Factors that may increase risk of adverse event in presence of aspiration Noe & Lianne 2021): Frequent aspiration of large volumes; Aspiration of thick, dense, and/or acidic materials; Weak cough; Reduced cognitive function; Dependence for feeding and/or oral hygiene Recommendations/Plan: Swallowing Evaluation Recommendations Swallowing Evaluation Recommendations Recommendations: PO diet PO Diet Recommendation: Dysphagia 1 (Pureed); Mildly thick liquids (Level 2, nectar thick) (family can bring pt soft snacks when visit for po) popsicles Liquid Administration via: Spoon; Straw- STRAW WITH NECTAR CHIN TUCK Medication Administration: Crushed with puree, START AND FOLLOW W/LIQUID Supervision: Staff to  assist with self-feeding; Full supervision/cueing for swallowing strategies Swallowing strategies  : Slow rate; Small bites/sips; Chin tuck (COUGH and EXPECTORATE at completion of meal;snack) ORAL SUCTION AFTER MEALS; START ALL INTAKE w/LIQUIDS Postural changes: Position pt fully upright for meals; Stay upright 30-60 min after meals Oral care recommendations: Oral care before ice chips/water  Caregiver Recommendations: Remove water  pitcher; Have oral suction available Treatment Plan Treatment Plan Treatment recommendations: Therapy as outlined in treatment plan below Follow-up recommendations: Other (comment) (f/u at next venue) Functional status assessment: Patient has had a recent decline in their functional status and demonstrates the ability to make significant improvements in function in a reasonable and predictable amount of time. Treatment frequency: Min 2x/week  Treatment duration: 2 weeks Interventions: Aspiration precaution training; Respiratory muscle strength training; Compensatory techniques; Patient/family education; Diet toleration management by SLP; Trials of upgraded texture/liquids Recommendations Recommendations for follow up therapy are one component of a multi-disciplinary discharge planning process, led by the attending physician.  Recommendations may be updated based on patient status, additional functional criteria and insurance authorization. Assessment: Orofacial Exam: Orofacial Exam Oral Cavity: Oral Hygiene: Xerostomia Oral Cavity - Dentition: Missing dentition; Poor condition Orofacial Anatomy: Other (comment) Oral Motor/Sensory Function: Suspected cranial nerve impairment CN V - Trigeminal: WFL; Not tested CN VII - Facial: Not tested CN IX - Glossopharyngeal, CN X - Vagus: Not tested CN XII - Hypoglossal: -- (reduced both right and left) Anatomy: Anatomy: Other (Comment) (has CorTrak in place) Boluses Administered: Boluses Administered Boluses Administered: Thin liquids (Level 0); Mildly thick liquids (Level 2, nectar thick); Moderately thick liquids (Level 3, honey thick); Puree; Solid  Oral Impairment Domain: Oral Impairment Domain Lip Closure: Interlabial escape, no progression to anterior lip Tongue control during bolus hold: Escape to lateral buccal cavity/floor of mouth Bolus preparation/mastication: Disorganized chewing/mashing with solid pieces of bolus unchewed Bolus transport/lingual motion: Slow tongue motion Oral residue: Trace residue lining oral structures Location of oral residue : Tongue Initiation of pharyngeal swallow : Pyriform sinuses; Posterior laryngeal surface of the epiglottis  Pharyngeal Impairment Domain: Pharyngeal Impairment Domain Soft palate elevation: No bolus between soft palate (SP)/pharyngeal wall (PW) Laryngeal elevation: Partial superior movement of thyroid  cartilage/partial approximation of arytenoids to epiglottic  petiole Anterior hyoid excursion: Partial anterior movement Epiglottic movement: Partial inversion Laryngeal vestibule closure: Incomplete, narrow column air/contrast in laryngeal vestibule Pharyngeal stripping wave : Present - diminished Pharyngeal contraction (A/P view only): N/A (unable due to pt's size and positioning) Pharyngoesophageal segment opening: Partial distention/partial duration, partial obstruction of flow Tongue base retraction: Narrow column of contrast or air between tongue base and PPW (suspect her small bore tube negatively impacted her swallowing) Pharyngeal residue: Collection of residue within or on pharyngeal structures Location of pharyngeal residue: Valleculae; Tongue base  Esophageal Impairment Domain: Esophageal Impairment Domain Esophageal clearance upright position: Esophageal retention Pill: Pill Puree: -- (DNT) Penetration/Aspiration Scale Score: Penetration/Aspiration Scale Score 1.  Material does not enter airway: Moderately thick liquids (Level 3, honey thick); Puree; Solid 7.  Material enters airway, passes BELOW cords and not ejected out despite cough attempt by patient: Thin liquids (Level 0); Mildly thick liquids (Level 2, nectar thick) Compensatory Strategies: Compensatory Strategies Compensatory strategies: Yes Effective Chin Tuck: Mildly thick liquid (Level 2, nectar thick) Left head turn: Ineffective Ineffective Left Head Turn: Puree; Mildly thick liquid (Level 2, nectar thick) (to decrease retention) Posterior head tilt: Effective Effective Posterior head tilt: Thin liquid (Level 0) (with tsp amount)   General Information: Caregiver present: No  Diet Prior to  this Study: Dysphagia 1 (pureed); Moderately thick liquids (Level 3, honey thick)   Temperature : Normal   Respiratory Status: WFL (does become slightly dyspneic with po intake)   No data recorded  History of Recent Intubation: No  Behavior/Cognition: Alert; Cooperative; Other (Comment); Requires cueing Self-Feeding  Abilities: Dependent for feeding Baseline vocal quality/speech: Hypophonia/low volume Volitional Cough: Able to elicit Volitional Swallow: Able to elicit Exam Limitations: Poor bolus acceptance (limited acceptance due to coughing episodes) Goal Planning: Prognosis for improved oropharyngeal function: Fair Barriers to Reach Goals: Motivation; Time post onset No data recorded Patient/Family Stated Goal: I don't like honey Consulted and agree with results and recommendations: Patient; Nurse; Nurse Tech Pain: Pain Assessment Pain Assessment: Faces Pain Score: 6 Faces Pain Scale: 4 Breathing: 0 Negative Vocalization: 0 Facial Expression: 0 Body Language: 0 Consolability: 0 PAINAD Score: 0 Pain Location: throat x2 days Pain Descriptors / Indicators: Aching; Moaning; Grimacing Pain Intervention(s): Limited activity within patient's tolerance; Monitored during session; Ice applied; Patient requesting pain meds-RN notified End of Session: Start Time:SLP Start Time (ACUTE ONLY): 0845 Stop Time: SLP Stop Time (ACUTE ONLY): 0915 Time Calculation:SLP Time Calculation (min) (ACUTE ONLY): 30 min Charges: SLP Evaluations $ SLP Speech Visit: 1 Visit SLP Evaluations $MBS Swallow: 1 Procedure $Swallowing Treatment: 1 Procedure SLP visit diagnosis: SLP Visit Diagnosis: Dysphagia, oropharyngeal phase (R13.12) Past Medical History: Past Medical History: Diagnosis Date  Anemia   iron insufion on 05/2016   Atypical chest pain   Atypical CP--stress test, cor angio, and CT chest negative in 2005. 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  History of DVT (deep vein thrombosis)   on OCP  History of kidney stones   Hypertension   Mild anemia   WBC mild high, lab 2012/  HEMATOLOGY   Osteoarthritis   Osteoarthritis of the hip, knee, and hand, vicodin , prn  Osteopenia   BD in 2013  Tobacco use   quit 03/2008  Vitamin D deficiency  Past Surgical History: Past Surgical History: Procedure Laterality Date  APPENDECTOMY    BALLOON DILATION N/A 07/09/2016  Procedure: BALLOON DILATION;  Surgeon: Gladis MARLA Louder, MD;  Location: [facility];  Service: Endoscopy;  Laterality: N/A;  COLONOSCOPY WITH PROPOFOL  N/A 07/09/2016  Procedure: COLONOSCOPY WITH PROPOFOL ;  Surgeon: Gladis MARLA Louder, MD;  Location: [facility];  Service: Endoscopy;  Laterality: N/A;  ESOPHAGOGASTRODUODENOSCOPY N/A 07/09/2016  Procedure: ESOPHAGOGASTRODUODENOSCOPY (EGD);  Surgeon: Gladis MARLA Louder, MD;  Location: [facility];  Service: Endoscopy;  Laterality: N/A;  EYE SURGERY    left eye cataract srugery   HERNIA REPAIR    surgery fro endometriosis    TONSILLECTOMY   Madelin MARLA, MS [facility] SLP Acute Rehab Services Office [phone] [name] 02/19/2024, 10:06 AM       Scheduled Meds:  artificial tears  1 drop Left Eye Daily   atorvastatin   40 mg Oral Daily   budesonide -glycopyrrolate -formoterol   2 puff Inhalation BID   Chlorhexidine  Gluconate Cloth  6 each Topical Daily   enoxaparin  (LOVENOX ) injection  40 mg Subcutaneous Q24H   feeding supplement  237 mL Oral BID BM   free water   100 mL Per Tube Q4H   Gerhardt's butt cream  Topical TID   insulin  aspart  0-5 Units Subcutaneous QHS   insulin  aspart  0-9 Units Subcutaneous TID WC   insulin  glargine-yfgn  20 Units Subcutaneous Q24H   losartan   50 mg Oral Daily   montelukast   10 mg Oral QPM   pantoprazole   40 mg Oral Daily   polyethylene glycol  17 g Oral Daily   prednisoLONE  acetate  1 drop Left Eye Q1200   senna-docusate  1 tablet Oral Daily   sodium chloride  flush  3 mL Intravenous Q12H   sodium chloride  flush  3-10 mL Intravenous Q12H   valACYclovir   1,000 mg Oral Daily   Continuous Infusions:   ceFAZolin  (ANCEF ) IV     feeding  supplement (GLUCERNA 1.5 CAL) Stopped (02/20/24 0000)     LOS: 17 days    Time spent: 35 minutes    Rasheen Schewe A Chelesa Weingartner, MD [facility]   If 7PM-7AM, please contact night-coverage [email or web address]  02/20/2024, 7:24 AM

## 2024-02-20 NOTE — Procedures (Signed)
 Interventional Radiology Procedure Note  Procedure: Gastrostomy tube placement  Complications: None  Estimated Blood Loss: < 10 mL  Findings: 18Fr gastrostomy tube placement.  Cordella DELENA Banner, MD

## 2024-02-20 NOTE — Inpatient Diabetes Management (Signed)
 Inpatient Diabetes Program Recommendations  AACE/ADA: New Consensus Statement on Inpatient Glycemic Control (2015)  Target Ranges:  Prepandial:   less than 140 mg/dL      Peak postprandial:   less than 180 mg/dL (1-2 hours)      Critically ill patients:  140 - 180 mg/dL   Lab Results  Component Value Date   GLUCAP 205 (H) 02/20/2024   HGBA1C 9.2 (H) 02/03/2024    Review of Glycemic Control  Diabetes history: DM2 Outpatient Diabetes medications: Amaryl 3 mg daily, Farxiga 10 mg QD Current orders for Inpatient glycemic control: Semglee  20 units Q 24H, Novolog  0-9 units TID with meals and 0-5 HS  HgbA1C - 9.2% Glucerna 45 ml/hour Ensure Hight protein (19 grams of carbohydrate) bid between meals  Inpatient Diabetes Program Recommendations:    -   Add Novolog  3 units Q4H for TF coverage  Continue to follow.  Thanks,  Clotilda Bull RN, MSN, BC-ADM Inpatient Diabetes Coordinator Team Pager [phone] (8a-5p)

## 2024-02-20 NOTE — Progress Notes (Signed)
Patient sleeping in NAD.

## 2024-02-20 NOTE — Plan of Care (Signed)
  Problem: Fluid Volume: Goal: Ability to maintain a balanced intake and output will improve Outcome: Progressing   Problem: Nutritional: Goal: Maintenance of adequate nutrition will improve Outcome: Progressing   Problem: Tissue Perfusion: Goal: Adequacy of tissue perfusion will improve Outcome: Progressing   Problem: Clinical Measurements: Goal: Will remain free from infection Outcome: Progressing Goal: Diagnostic test results will improve Outcome: Progressing Goal: Respiratory complications will improve Outcome: Progressing Goal: Cardiovascular complication will be avoided Outcome: Progressing   Problem: Nutrition: Goal: Adequate nutrition will be maintained Outcome: Progressing   Problem: Elimination: Goal: Will not experience complications related to bowel motility Outcome: Progressing Goal: Will not experience complications related to urinary retention Outcome: Progressing   Problem: Safety: Goal: Ability to remain free from injury will improve Outcome: Progressing

## 2024-02-21 DIAGNOSIS — R531 Weakness: Secondary | ICD-10-CM | POA: Diagnosis not present

## 2024-02-21 DIAGNOSIS — I639 Cerebral infarction, unspecified: Secondary | ICD-10-CM | POA: Diagnosis not present

## 2024-02-21 LAB — GLUCOSE, CAPILLARY
Glucose-Capillary: 214 mg/dL — ABNORMAL HIGH (ref 70–99)
Glucose-Capillary: 193 mg/dL — ABNORMAL HIGH (ref 70–99)
Glucose-Capillary: 197 mg/dL — ABNORMAL HIGH (ref 70–99)
Glucose-Capillary: 188 mg/dL — ABNORMAL HIGH (ref 70–99)

## 2024-02-21 LAB — CBC
HCT: 40.9 % (ref 36.0–46.0)
Hemoglobin: 12.1 g/dL (ref 12.0–15.0)
MCH: 26 pg (ref 26.0–34.0)
MCHC: 29.6 g/dL — ABNORMAL LOW (ref 30.0–36.0)
MCV: 87.8 fL (ref 80.0–100.0)
Platelets: 150 K/uL (ref 150–400)
RBC: 4.66 MIL/uL (ref 3.87–5.11)
RDW: 18.6 % — ABNORMAL HIGH (ref 11.5–15.5)
WBC: 11.5 K/uL — ABNORMAL HIGH (ref 4.0–10.5)
nRBC: 0 % (ref 0.0–0.2)

## 2024-02-21 LAB — BASIC METABOLIC PANEL WITH GFR
Anion gap: 12 (ref 5–15)
BUN: 56 mg/dL — ABNORMAL HIGH (ref 8–23)
CO2: 25 mmol/L (ref 22–32)
Calcium: 8.9 mg/dL (ref 8.9–10.3)
Chloride: 103 mmol/L (ref 98–111)
Creatinine, Ser: 1.31 mg/dL — ABNORMAL HIGH (ref 0.44–1.00)
GFR, Estimated: 41 mL/min — ABNORMAL LOW (ref >60)
Glucose, Bld: 232 mg/dL — ABNORMAL HIGH (ref 70–99)
Potassium: 4.7 mmol/L (ref 3.5–5.1)
Sodium: 140 mmol/L (ref 135–145)

## 2024-02-21 MED ORDER — LIDOCAINE 5 % EX PTCH
1.0000 | MEDICATED_PATCH | CUTANEOUS | Status: DC
  Administered 2024-02-21 – 2024-02-25 (×5): 1 via TRANSDERMAL
  Filled 2024-02-21 (×5): qty 1

## 2024-02-21 MED ORDER — NITROGLYCERIN 0.4 MG SL SUBL: 0.4000 mg | SUBLINGUAL_TABLET | SUBLINGUAL | Status: DC | PRN

## 2024-02-21 MED ORDER — FREE WATER
200.0000 mL | Status: DC
  Administered 2024-02-21 – 2024-02-25 (×24): 200 mL

## 2024-02-21 MED ORDER — GUAIFENESIN ER 600 MG PO TB12
600.0000 mg | ORAL_TABLET | Freq: Two times a day (BID) | ORAL | Status: DC
  Administered 2024-02-21 – 2024-02-25 (×8): 600 mg via ORAL
  Filled 2024-02-21 (×8): qty 1

## 2024-02-21 MED ORDER — PANTOPRAZOLE SODIUM 40 MG PO TBEC
40.0000 mg | DELAYED_RELEASE_TABLET | Freq: Two times a day (BID) | ORAL | Status: DC
  Administered 2024-02-21 – 2024-02-25 (×8): 40 mg via ORAL
  Filled 2024-02-21 (×8): qty 1

## 2024-02-21 NOTE — Plan of Care (Signed)
  Problem: Education: Goal: Ability to describe self-care measures that may prevent or decrease complications (Diabetes Survival Skills Education) will improve Outcome: Progressing Goal: Individualized Educational Video(s) Outcome: Progressing   Problem: Coping: Goal: Ability to adjust to condition or change in health will improve Outcome: Progressing   Problem: Fluid Volume: Goal: Ability to maintain a balanced intake and output will improve Outcome: Progressing   Problem: Health Behavior/Discharge Planning: Goal: Ability to identify and utilize available resources and services will improve Outcome: Progressing Goal: Ability to manage health-related needs will improve Outcome: Progressing   Problem: Metabolic: Goal: Ability to maintain appropriate glucose levels will improve Outcome: Progressing   Problem: Nutritional: Goal: Maintenance of adequate nutrition will improve Outcome: Progressing Goal: Progress toward achieving an optimal weight will improve Outcome: Progressing   Problem: Skin Integrity: Goal: Risk for impaired skin integrity will decrease Outcome: Progressing   Problem: Tissue Perfusion: Goal: Adequacy of tissue perfusion will improve Outcome: Progressing   Problem: Education: Goal: Knowledge of General Education information will improve Description: Including pain rating scale, medication(s)/side effects and non-pharmacologic comfort measures Outcome: Progressing   Problem: Health Behavior/Discharge Planning: Goal: Ability to manage health-related needs will improve Outcome: Progressing   Problem: Clinical Measurements: Goal: Ability to maintain clinical measurements within normal limits will improve Outcome: Progressing Goal: Will remain free from infection Outcome: Progressing Goal: Diagnostic test results will improve Outcome: Progressing Goal: Respiratory complications will improve Outcome: Progressing Goal: Cardiovascular complication will  be avoided Outcome: Progressing   Problem: Activity: Goal: Risk for activity intolerance will decrease Outcome: Progressing   Problem: Nutrition: Goal: Adequate nutrition will be maintained Outcome: Progressing   Problem: Coping: Goal: Level of anxiety will decrease Outcome: Progressing   Problem: Elimination: Goal: Will not experience complications related to bowel motility Outcome: Progressing Goal: Will not experience complications related to urinary retention Outcome: Progressing   Problem: Pain Managment: Goal: General experience of comfort will improve and/or be controlled Outcome: Progressing   Problem: Safety: Goal: Ability to remain free from injury will improve Outcome: Progressing   Problem: Skin Integrity: Goal: Risk for impaired skin integrity will decrease Outcome: Progressing   Problem: Activity: Goal: Ability to tolerate increased activity will improve Outcome: Progressing   Problem: Clinical Measurements: Goal: Ability to maintain a body temperature in the normal range will improve Outcome: Progressing   Problem: Respiratory: Goal: Ability to maintain adequate ventilation will improve Outcome: Progressing Goal: Ability to maintain a clear airway will improve Outcome: Progressing   Problem: Education: Goal: Knowledge of disease or condition will improve Outcome: Progressing Goal: Knowledge of secondary prevention will improve (MUST DOCUMENT ALL) Outcome: Progressing Goal: Knowledge of patient specific risk factors will improve (DELETE if not current risk factor) Outcome: Progressing   Problem: Ischemic Stroke/TIA Tissue Perfusion: Goal: Complications of ischemic stroke/TIA will be minimized Outcome: Progressing   Problem: Coping: Goal: Will verbalize positive feelings about self Outcome: Progressing Goal: Will identify appropriate support needs Outcome: Progressing   Problem: Health Behavior/Discharge Planning: Goal: Ability to manage  health-related needs will improve Outcome: Progressing Goal: Goals will be collaboratively established with patient/family Outcome: Progressing   Problem: Self-Care: Goal: Ability to participate in self-care as condition permits will improve Outcome: Progressing Goal: Verbalization of feelings and concerns over difficulty with self-care will improve Outcome: Progressing Goal: Ability to communicate needs accurately will improve Outcome: Progressing   Problem: Nutrition: Goal: Risk of aspiration will decrease Outcome: Progressing Goal: Dietary intake will improve Outcome: Progressing

## 2024-02-21 NOTE — Plan of Care (Signed)
 Complained of multiple pain site including chest. MD at bedside with assessment and EKG performed. Requested PRN pain med and ice chips. Refusing meal and ensure. Offered nectar juice and and water  but declines.    Problem: Education: Goal: Ability to describe self-care measures that may prevent or decrease complications (Diabetes Survival Skills Education) will improve Outcome: Progressing   Problem: Nutritional: Goal: Maintenance of adequate nutrition will improve Outcome: Progressing   Problem: Skin Integrity: Goal: Risk for impaired skin integrity will decrease Outcome: Progressing   Problem: Tissue Perfusion: Goal: Adequacy of tissue perfusion will improve Outcome: Progressing   Problem: Safety: Goal: Ability to remain free from injury will improve Outcome: Progressing   Problem: Skin Integrity: Goal: Risk for impaired skin integrity will decrease Outcome: Progressing   Problem: Nutrition: Goal: Risk of aspiration will decrease Outcome: Progressing

## 2024-02-21 NOTE — Progress Notes (Signed)
 PROGRESS NOTE    Christina Bennett  FMW:[id number] DOB: [birth year] DOA: 02/02/2024 PCP: Ransom Other, MD   Brief Narrative: 83 year old with past medical history significant for COPD, obesity, CAD, CKD 3A who presents with dizziness, blurry vision, headache, chest congestion, speech disturbance.  EMS was called and patient was brought to the hospital on her initial physical examination blood pressure was 122/63.  Chest x-ray with hypoinflation and mild cardiomegaly.  Patient was agitated in the ED and received multiple medications Ativan , droperidol  Geodon  and Haldol .  After admission she developed acute left-sided weakness overnight and code stroke was called received TNK and transferred to ICU.  CT head no acute abnormality.  CT angio head severe proximal right P2 stenosis, moderate stenosis of the origin of the vertebral artery.  MRI showed right pontine infarct.  [facility].  As of 7/10 she is very weak and deconditioned, core track in place.   Assessment & Plan:   Principal Problem:   CVA (cerebral vascular accident) (HCC) Active Problems:   Acute on chronic diastolic CHF (congestive heart failure) (HCC)   CKD stage 3a, GFR 45-59 ml/min (HCC)   Essential hypertension   History of CAD (coronary artery disease)   Iron deficiency anemia   COPD (chronic obstructive pulmonary disease) (HCC)   Type 2 diabetes mellitus with hyperlipidemia (HCC)   Malnutrition of moderate degree   Obesity, class 1   Dysphagia  1-CVA (cerebral vascular accident)  -MRI brain showed small pontine infarct. Post-stroke course complicated by dysphagia. Non-invasive angiography showed focal severe right P2 stenosis and moderate vertebral right stenosis.  -Echocardiogram showed no cardiogenic source of embolism. -Carotid imaging unremarkable.   -acute metabolic encephalopathy multifactorial, delirium.  MS improved.  -Plan to continue with plavix  and statins.  -Stable. Peg placed  7/11  Acute on chronic diastolic heart failure Acute hypoxemic respiratory failure due to acute cardiogenic pulmonary edema has resolved.  Ruled out for pneumonia.  -ECHO Ef 55 % -She was diuresed.  -Lasix  has been on hold.  Cr increased to 1.3 hold starting lasix .   AKI CKD stage IIIa: Renal function improved.  Monitor.  Cr peak to 1.8---1.3---0.9---1.3 Monitor closely on ARB.   Essential hypertension: Continue Losartan .   History of CAD: Continue with plavix   Iron deficiency anemia: Monitor hb.   COPD Change inhaler to nebulizer.   Diabetes type 2 with hyperglycemia SSI  Malnutrition of moderate degree Dysphagia Underwent peg tube placement 7/11 Also started on Dysphagia diet.   Obesity class I BMI 34   Nutrition Problem: Moderate Malnutrition Etiology: poor appetite (in the context of acute illness)    Signs/Symptoms: energy intake < 75% for > 7 days, mild muscle depletion, mild fat depletion    Interventions: Prostat, Tube feeding  Estimated body mass index is 35.48 kg/m as calculated from the following:   Height as of this encounter: 5' 2 (1.575 m).   Weight as of this encounter: 88 kg.   DVT prophylaxis: Lovenox  Code Status: DNR limited Family Communication: niece who was at bedside 7/11.  Disposition Plan:  Status is: Inpatient Remains inpatient appropriate because: management after stroke.     Consultants:  Neurology     Subjective: She is alert, complaining of back pain. Would like something to drink.   Objective: Vitals:   02/21/24 0409 02/21/24 0620 02/21/24 0757 02/21/24 0829  BP: (!) 109/52 (!) 109/52 (!) 110/33   Pulse: 68  72 62  Resp: 18  18 18   Temp:  98 F (36.7 C)   TempSrc:      SpO2: 99%  95% 91%  Weight:      Height:        Intake/Output Summary (Last 24 hours) at 02/21/2024 1122 Last data filed at 02/21/2024 0515 Gross per 24 hour  Intake 33 ml  Output 425 ml  Net -392 ml   Filed Weights   02/16/24  0535 02/17/24 0500 02/20/24 0500  Weight: 85.4 kg 86 kg 88 kg    Examination:  General exam: NAD Respiratory system: CTA Cardiovascular system:S 1, S 2 RRR Gastrointestinal system:BS present, soft, nt Central nervous system:Alert Extremities: Symmetric 5 x 5 power.    Data Reviewed: I have personally reviewed following labs and imaging studies  CBC: Recent Labs  Lab 02/17/24 0457 02/20/24 0359 02/21/24 0457  WBC 12.2* 12.2* 11.5*  HGB 12.1 12.5 12.1  HCT 42.5 42.8 40.9  MCV 88.9 87.7 87.8  PLT 183 150 150   Basic Metabolic Panel: Recent Labs  Lab 02/15/24 0337 02/17/24 0457 02/20/24 0359 02/21/24 0457  NA 146* 145 145 140  K 3.7 4.3 4.7 4.7  CL 105 104 104 103  CO2 32 32 29 25  GLUCOSE 316* 388* 212* 232*  BUN 41* 44* 41* 56*  CREATININE 1.11* 1.02* 0.87 1.31*  CALCIUM  9.1 9.0 9.2 8.9  MG 1.9 2.0  --   --   PHOS 3.3  --   --   --    GFR: Estimated Creatinine Clearance: 34.7 mL/min (A) (by C-G formula based on SCr of 1.31 mg/dL (H)). Liver Function Tests: Recent Labs  Lab 02/17/24 0457  AST 12*  ALT 10  ALKPHOS 80  BILITOT 0.6  PROT 6.1*  ALBUMIN 2.9*   No results for input(s): LIPASE, AMYLASE in the last 168 hours. No results for input(s): AMMONIA in the last 168 hours. Coagulation Profile: Recent Labs  Lab 02/20/24 0359  INR 1.1   Cardiac Enzymes: No results for input(s): CKTOTAL, CKMB, CKMBINDEX, TROPONINI in the last 168 hours. BNP (last 3 results) No results for input(s): PROBNP in the last 8760 hours. HbA1C: No results for input(s): HGBA1C in the last 72 hours. CBG: Recent Labs  Lab 02/20/24 0606 02/20/24 1119 02/20/24 1625 02/20/24 2119 02/21/24 0604  GLUCAP 205* 231* 182* 178* 214*   Lipid Profile: No results for input(s): CHOL, HDL, LDLCALC, TRIG, CHOLHDL, LDLDIRECT in the last 72 hours. Thyroid  Function Tests: No results for input(s): TSH, T4TOTAL, FREET4, T3FREE, THYROIDAB in the  last 72 hours. Anemia Panel: No results for input(s): VITAMINB12, FOLATE, FERRITIN, TIBC, IRON, RETICCTPCT in the last 72 hours. Sepsis Labs: No results for input(s): PROCALCITON, LATICACIDVEN in the last 168 hours.  No results found for this or any previous visit (from the past 240 hours).       Radiology Studies: IR GASTROSTOMY TUBE MOD SED Result Date: 02/20/2024 Jenna Cordella LABOR, MD     02/20/2024 10:56 AM Interventional Radiology Procedure Note Procedure: Gastrostomy tube placement Complications: None Estimated Blood Loss: < 10 mL Findings: 18Fr gastrostomy tube placement. Cordella LABOR Jenna, MD        Scheduled Meds:  arformoterol   15 mcg Nebulization BID   artificial tears  1 drop Left Eye Daily   atorvastatin   40 mg Oral Daily   budesonide  (PULMICORT ) nebulizer solution  0.25 mg Nebulization BID   Chlorhexidine  Gluconate Cloth  6 each Topical Daily   enoxaparin  (LOVENOX ) injection  40 mg Subcutaneous Q24H   feeding supplement  237 mL Oral BID BM   free water   100 mL Per Tube Q4H   Gerhardt's butt cream   Topical TID   insulin  aspart  0-5 Units Subcutaneous QHS   insulin  aspart  0-9 Units Subcutaneous TID WC   insulin  glargine-yfgn  20 Units Subcutaneous Q24H   lidocaine   1 patch Transdermal Q24H   losartan   50 mg Oral Daily   montelukast   10 mg Oral QPM   neomycin -bacitracin -polymyxin  1 Application Topical Daily   pantoprazole   40 mg Oral Daily   polyethylene glycol  17 g Oral Daily   prednisoLONE  acetate  1 drop Left Eye Q1200   senna-docusate  1 tablet Oral Daily   sodium chloride  flush  3 mL Intravenous Q12H   sodium chloride  flush  3-10 mL Intravenous Q12H   valACYclovir   1,000 mg Oral Daily   Continuous Infusions:  feeding supplement (GLUCERNA 1.5 CAL) 1,000 mL (02/21/24 0515)     LOS: 18 days    Time spent: 35 minutes    Karnisha Lefebre A Nancye Grumbine, MD [facility]   If 7PM-7AM, please contact  night-coverage [email or web address]  02/21/2024, 11:22 AM

## 2024-02-22 DIAGNOSIS — I639 Cerebral infarction, unspecified: Secondary | ICD-10-CM | POA: Diagnosis not present

## 2024-02-22 DIAGNOSIS — R531 Weakness: Secondary | ICD-10-CM | POA: Diagnosis not present

## 2024-02-22 LAB — GLUCOSE, CAPILLARY
Glucose-Capillary: 229 mg/dL — ABNORMAL HIGH (ref 70–99)
Glucose-Capillary: 248 mg/dL — ABNORMAL HIGH (ref 70–99)
Glucose-Capillary: 227 mg/dL — ABNORMAL HIGH (ref 70–99)
Glucose-Capillary: 217 mg/dL — ABNORMAL HIGH (ref 70–99)
Glucose-Capillary: 237 mg/dL — ABNORMAL HIGH (ref 70–99)

## 2024-02-22 LAB — BASIC METABOLIC PANEL WITH GFR
Anion gap: 10 (ref 5–15)
BUN: 52 mg/dL — ABNORMAL HIGH (ref 8–23)
CO2: 27 mmol/L (ref 22–32)
Calcium: 8.9 mg/dL (ref 8.9–10.3)
Chloride: 105 mmol/L (ref 98–111)
Creatinine, Ser: 1.02 mg/dL — ABNORMAL HIGH (ref 0.44–1.00)
GFR, Estimated: 55 mL/min — ABNORMAL LOW (ref >60)
Glucose, Bld: 241 mg/dL — ABNORMAL HIGH (ref 70–99)
Potassium: 4.6 mmol/L (ref 3.5–5.1)
Sodium: 142 mmol/L (ref 135–145)

## 2024-02-22 NOTE — Progress Notes (Signed)
 PROGRESS NOTE    Christina Bennett  FMW:[id number] DOB: 03/17/1941 DOA: 02/02/2024 PCP: Ransom Other, MD   Brief Narrative: 83 year old with past medical history significant for COPD, obesity, CAD, CKD 3A who presents with dizziness, blurry vision, headache, chest congestion, speech disturbance.  EMS was called and patient was brought to the hospital on her initial physical examination blood pressure was 122/63.  Chest x-ray with hypoinflation and mild cardiomegaly.  Patient was agitated in the ED and received multiple medications Ativan , droperidol  Geodon  and Haldol .  After admission she developed acute left-sided weakness overnight and code stroke was called received TNK and transferred to ICU.  CT head no acute abnormality.  CT angio head severe proximal right P2 stenosis, moderate stenosis of the origin of the vertebral artery.  MRI showed right pontine infarct.  [facility] 6/[facility].  As of 7/10 she is very weak and deconditioned. Underwent Peg placement 7/11.  She is stable to transfer to SNF>    Assessment & Plan:   Principal Problem:   CVA (cerebral vascular accident) [facility]) Active Problems:   Acute on chronic diastolic CHF (congestive heart failure) (HCC)   CKD stage 3a, GFR 45-59 ml/min (HCC)   Essential hypertension   History of CAD (coronary artery disease)   Iron deficiency anemia   COPD (chronic obstructive pulmonary disease) (HCC)   Type 2 diabetes mellitus with hyperlipidemia (HCC)   Malnutrition of moderate degree   Obesity, class 1   Dysphagia  1-CVA (cerebral vascular accident)  -MRI brain showed small pontine infarct. Post-stroke course complicated by dysphagia. Non-invasive angiography showed focal severe right P2 stenosis and moderate vertebral right stenosis.  -Echocardiogram showed no cardiogenic source of embolism. -Carotid imaging unremarkable.   -Acute metabolic encephalopathy multifactorial, delirium.  -MS improved.  -Plan to continue with plavix   and statins.  -Stable. Peg placed 7/11  Acute on chronic diastolic heart failure Acute hypoxemic respiratory failure due to acute cardiogenic pulmonary edema has resolved.  Ruled out for pneumonia.  -ECHO Ef 55 % -She was diuresed.  -Lasix  has been on hold. Resume tomorrow if cr remain stable.    AKI CKD stage IIIa: Renal function improved.  Monitor.  Cr peak to 1.8---1.3---0.9---1.3--1.0 Monitor closely on ARB.   Essential hypertension: Continue Losartan .   History of CAD: Continue with plavix   Iron deficiency anemia: Monitor hb.   COPD Change inhaler to nebulizer.   Diabetes type 2 with hyperglycemia SSI  Malnutrition of moderate degree Dysphagia Underwent peg tube placement 7/11 Also started on Dysphagia diet.   Obesity class I BMI 34   Nutrition Problem: Moderate Malnutrition Etiology: poor appetite (in the context of acute illness)    Signs/Symptoms: energy intake < 75% for > 7 days, mild muscle depletion, mild fat depletion    Interventions: Prostat, Tube feeding  Estimated body mass index is 35.48 kg/m as calculated from the following:   Height as of this encounter: 5' 2 (1.575 m).   Weight as of this encounter: 88 kg.   DVT prophylaxis: Lovenox  Code Status: DNR limited Family Communication: niece who was at bedside 7/11.  Disposition Plan:  Status is: Inpatient Remains inpatient appropriate because: management after stroke.     Consultants:  Neurology     Subjective: She continue to asked for water . She doesn't like nectar thick.  Report mild pain at site peg.   Objective: Vitals:   02/22/24 0343 02/22/24 0803 02/22/24 0809 02/22/24 1231  BP: 124/73 (!) 142/47  (!) 132/51  Pulse:  78 79 80 84  Resp: 18 16 16 17   Temp:  98.1 F (36.7 C)  97.9 F (36.6 C)  TempSrc:  Oral  Oral  SpO2: 94% 95% 94% 92%  Weight:      Height:        Intake/Output Summary (Last 24 hours) at 02/22/2024 1516 Last data filed at 02/22/2024  1100 Gross per 24 hour  Intake --  Output 1600 ml  Net -1600 ml   Filed Weights   02/16/24 0535 02/17/24 0500 02/20/24 0500  Weight: 85.4 kg 86 kg 88 kg    Examination:  General exam: NAD Respiratory system: CTA Cardiovascular system: S 1, S 2  RRR Gastrointestinal system: BS present, Soft, nt Central nervous system: Alert, follows command Extremities: no edema    Data Reviewed: I have personally reviewed following labs and imaging studies  CBC: Recent Labs  Lab 02/17/24 0457 02/20/24 0359 02/21/24 0457  WBC 12.2* 12.2* 11.5*  HGB 12.1 12.5 12.1  HCT 42.5 42.8 40.9  MCV 88.9 87.7 87.8  PLT 183 150 150   Basic Metabolic Panel: Recent Labs  Lab 02/17/24 0457 02/20/24 0359 02/21/24 0457 02/22/24 0647  NA 145 145 140 142  K 4.3 4.7 4.7 4.6  CL 104 104 103 105  CO2 32 29 25 27   GLUCOSE 388* 212* 232* 241*  BUN 44* 41* 56* 52*  CREATININE 1.02* 0.87 1.31* 1.02*  CALCIUM  9.0 9.2 8.9 8.9  MG 2.0  --   --   --    GFR: Estimated Creatinine Clearance: 44.6 mL/min (A) (by C-G formula based on SCr of 1.02 mg/dL (H)). Liver Function Tests: Recent Labs  Lab 02/17/24 0457  AST 12*  ALT 10  ALKPHOS 80  BILITOT 0.6  PROT 6.1*  ALBUMIN 2.9*   No results for input(s): LIPASE, AMYLASE in the last 168 hours. No results for input(s): AMMONIA in the last 168 hours. Coagulation Profile: Recent Labs  Lab 02/20/24 0359  INR 1.1   Cardiac Enzymes: No results for input(s): CKTOTAL, CKMB, CKMBINDEX, TROPONINI in the last 168 hours. BNP (last 3 results) No results for input(s): PROBNP in the last 8760 hours. HbA1C: No results for input(s): HGBA1C in the last 72 hours. CBG: Recent Labs  Lab 02/21/24 1826 02/21/24 2123 02/22/24 0610 02/22/24 0805 02/22/24 1231  GLUCAP 197* 188* 229* 248* 227*   Lipid Profile: No results for input(s): CHOL, HDL, LDLCALC, TRIG, CHOLHDL, LDLDIRECT in the last 72 hours. Thyroid  Function Tests: No  results for input(s): TSH, T4TOTAL, FREET4, T3FREE, THYROIDAB in the last 72 hours. Anemia Panel: No results for input(s): VITAMINB12, FOLATE, FERRITIN, TIBC, IRON, RETICCTPCT in the last 72 hours. Sepsis Labs: No results for input(s): PROCALCITON, LATICACIDVEN in the last 168 hours.  No results found for this or any previous visit (from the past 240 hours).       Radiology Studies: No results found.       Scheduled Meds:  arformoterol   15 mcg Nebulization BID   artificial tears  1 drop Left Eye Daily   atorvastatin   40 mg Oral Daily   budesonide  (PULMICORT ) nebulizer solution  0.25 mg Nebulization BID   Chlorhexidine  Gluconate Cloth  6 each Topical Daily   enoxaparin  (LOVENOX ) injection  40 mg Subcutaneous Q24H   feeding supplement  237 mL Oral BID BM   free water   200 mL Per Tube Q4H   Gerhardt's butt cream   Topical TID   guaiFENesin   600 mg Oral BID  insulin  aspart  0-5 Units Subcutaneous QHS   insulin  aspart  0-9 Units Subcutaneous TID WC   insulin  glargine-yfgn  20 Units Subcutaneous Q24H   lidocaine   1 patch Transdermal Q24H   losartan   50 mg Oral Daily   montelukast   10 mg Oral QPM   neomycin -bacitracin -polymyxin  1 Application Topical Daily   pantoprazole   40 mg Oral BID   polyethylene glycol  17 g Oral Daily   prednisoLONE  acetate  1 drop Left Eye Q1200   senna-docusate  1 tablet Oral Daily   sodium chloride  flush  3 mL Intravenous Q12H   sodium chloride  flush  3-10 mL Intravenous Q12H   valACYclovir   1,000 mg Oral Daily   Continuous Infusions:  feeding supplement (GLUCERNA 1.5 CAL) 1,000 mL (02/22/24 0850)     LOS: 19 days    Time spent: 35 minutes    Dickie Cloe A Geovana Gebel, MD [facility]   If 7PM-7AM, please contact night-coverage [email or web address]  02/22/2024, 3:16 PM

## 2024-02-22 NOTE — Plan of Care (Signed)
 Discussion about eating Ice. Encourage meds with applesauce. Declined Ensure. OJ on spoon. Nectar drinks are what was ordered.    Problem: Education: Goal: Ability to describe self-care measures that may prevent or decrease complications (Diabetes Survival Skills Education) will improve Outcome: Progressing   Problem: Coping: Goal: Ability to adjust to condition or change in health will improve Outcome: Progressing   Problem: Skin Integrity: Goal: Risk for impaired skin integrity will decrease Outcome: Progressing   Problem: Activity: Goal: Risk for activity intolerance will decrease Outcome: Progressing   Problem: Nutrition: Goal: Adequate nutrition will be maintained Outcome: Progressing   Problem: Safety: Goal: Ability to remain free from injury will improve Outcome: Progressing   Problem: Skin Integrity: Goal: Risk for impaired skin integrity will decrease Outcome: Progressing

## 2024-02-23 DIAGNOSIS — I639 Cerebral infarction, unspecified: Secondary | ICD-10-CM | POA: Diagnosis not present

## 2024-02-23 LAB — GLUCOSE, CAPILLARY
Glucose-Capillary: 236 mg/dL — ABNORMAL HIGH (ref 70–99)
Glucose-Capillary: 230 mg/dL — ABNORMAL HIGH (ref 70–99)
Glucose-Capillary: 238 mg/dL — ABNORMAL HIGH (ref 70–99)
Glucose-Capillary: 219 mg/dL — ABNORMAL HIGH (ref 70–99)

## 2024-02-23 LAB — BASIC METABOLIC PANEL WITH GFR
Anion gap: 11 (ref 5–15)
BUN: 44 mg/dL — ABNORMAL HIGH (ref 8–23)
CO2: 23 mmol/L (ref 22–32)
Calcium: 8.8 mg/dL — ABNORMAL LOW (ref 8.9–10.3)
Chloride: 105 mmol/L (ref 98–111)
Creatinine, Ser: 0.93 mg/dL (ref 0.44–1.00)
GFR, Estimated: >60 mL/min (ref >60)
Glucose, Bld: 220 mg/dL — ABNORMAL HIGH (ref 70–99)
Potassium: 4.4 mmol/L (ref 3.5–5.1)
Sodium: 139 mmol/L (ref 135–145)

## 2024-02-23 MED ORDER — FUROSEMIDE 20 MG PO TABS
20.0000 mg | ORAL_TABLET | Freq: Every day | ORAL | Status: DC
  Administered 2024-02-23 – 2024-02-25 (×3): 20 mg via ORAL
  Filled 2024-02-23 (×3): qty 1

## 2024-02-23 NOTE — Inpatient Diabetes Management (Signed)
 Inpatient Diabetes Program Recommendations  AACE/ADA: New Consensus Statement on Inpatient Glycemic Control (2015)  Target Ranges:  Prepandial:   less than 140 mg/dL      Peak postprandial:   less than 180 mg/dL (1-2 hours)      Critically ill patients:  140 - 180 mg/dL    Latest Reference Range & Units 02/22/24 06:10 02/22/24 08:05 02/22/24 12:31 02/22/24 16:03 02/22/24 21:01  Glucose-Capillary 70 - 99 mg/dL 770 (H) 751 (H)  3 units Novolog   227 (H)  3 units Novolog   20 units Semglee  @1048   217 (H)  3 units Novolog   237 (H)  2 units Novolog    (H): Data is abnormally high  Latest Reference Range & Units 02/23/24 06:08  Glucose-Capillary 70 - 99 mg/dL 763 (H)  3 units Novolog    (H): Data is abnormally high     Home DM Meds: Amaryl 2 mg BID  Current Orders: Semglee  20 units Q24H     Novolog  Sensitive Correction Scale/ SSI (0-9 units) TID AC + HS     Getting PO Dysphagic Diet Getting Tube Feeds 45cc/hr   MD- Please consider:  1. Increase the frequency of the Novolog  SSI to Q4 hours since pt getting tube feeds  2. Start Novolog  Tube Feed Coverage: Novolog  3 units Q4 hours HOLD if tube feed HELD for any reason    --Will follow patient during hospitalization--  Adina Rudolpho Arrow RN, MSN, CDCES Diabetes Coordinator Inpatient Glycemic Control Team Team Pager: [phone] (8a-5p)

## 2024-02-23 NOTE — Progress Notes (Signed)
 Physical Therapy Treatment Patient Details Name: Christina Bennett MRN: [id number] DOB: 1941-07-06 Today's Date: 02/23/2024   History of Present Illness The pt is an 83 yo female presenting 6/23 with AMS. Initial work up with concern for UTI, pt given antibiotics and returned to baseline. Code stroke called 1AM 6/26, pt given TNK and transferred to ICU. 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 PEG placement 7/11. PMH includes: COPD, DM II, DVT, HTN, arthritis.    PT Comments  Pt received in supine and agreeable to session. Pt limited by PEG site pain this session, but is able to tolerate bed mobility and standing trials with mod-max A +2. Pt demonstrates difficulty reaching a full upright posture and demonstrates very limited standing tolerance, but is able to attempt lateral steps at EOB. Pt requires assist to advance LLE, but is unable to advance RLE before sitting. Pt continues to benefit from PT services to progress toward functional mobility goals.    If plan is discharge home, recommend the following: Two people to help with walking and/or transfers;Two people to help with bathing/dressing/bathroom;Assistance with cooking/housework;Direct supervision/assist for medications management;Direct supervision/assist for financial management;Assist for transportation;Help with stairs or ramp for entrance   Can travel by private vehicle     No  Equipment Recommendations  Wheelchair (measurements PT);Wheelchair cushion (measurements PT);Hoyer lift    Recommendations for [employer]       Precautions / Restrictions Precautions Precautions: Fall Recall of Precautions/Restrictions: Impaired Precaution/Restrictions Comments: SBP <180 Restrictions Weight Bearing Restrictions Per Provider Order: No     Mobility  Bed Mobility Overal bed mobility: Needs Assistance Bed Mobility: Supine to Sit,  Sit to Supine, Rolling Rolling: Mod assist, Used rails   Supine to sit: Mod assist, +2 for physical assistance Sit to supine: Max assist, +2 for physical assistance   General bed mobility comments: Pt able to advance BLE to EOB with min A and push up on rail, requiring mod A +2 for trunk elevation and scooting forward to EOB. Increased assist back to supine due to increased pain    Transfers Overall transfer level: Needs assistance Equipment used: Rolling walker (2 wheels) Transfers: Sit to/from Stand Sit to Stand: From elevated surface, Mod assist, +2 physical assistance           General transfer comment: STS from EOB with mod A +2 for power up and dense cues for upright posture and hand placement. Pt able to take a few short side steps towards HOB with max A +2 for weight shifting and LLE advancement    Ambulation/Gait                   Stairs             Wheelchair Mobility     Tilt Bed    Modified Rankin (Stroke Patients Only)       Balance Overall balance assessment: Needs assistance Sitting-balance support: Feet supported, Single extremity supported   Sitting balance - Comments: sitting EOB with CGA   Standing balance support: Bilateral upper extremity supported, Reliant on assistive device for balance, During functional activity Standing balance-Leahy Scale: Poor Standing balance comment: reliant on RW and therapist support                            Communication Communication Communication: No apparent difficulties Factors Affecting Communication:  Reduced clarity of speech  Cognition Arousal: Alert Behavior During Therapy: WFL for tasks assessed/performed   PT - Cognitive impairments: Sequencing, Problem solving, Initiation                         Following commands: Impaired Following commands impaired: Follows one step commands with increased time    Cueing Cueing Techniques: Verbal cues, Tactile cues   Exercises      General Comments        Pertinent Vitals/Pain Pain Assessment Pain Assessment: Faces Faces Pain Scale: Hurts whole lot Pain Location: PEG site with mobility Pain Descriptors / Indicators: Grimacing, Guarding, Sore Pain Intervention(s): Limited activity within patient's tolerance, Monitored during session, Repositioned     PT Goals (current goals can now be found in the care plan section) Acute Rehab PT Goals Patient Stated Goal: return to living at her house PT Goal Formulation: With patient Time For Goal Achievement: 02/20/24 Progress towards PT goals: Progressing toward goals    Frequency    Min 2X/week       AM-PAC PT 6 Clicks Mobility   Outcome Measure  Help needed turning from your back to your side while in a flat bed without using bedrails?: A Lot Help needed moving from lying on your back to sitting on the side of a flat bed without using bedrails?: A Lot Help needed moving to and from a bed to a chair (including a wheelchair)?: Total Help needed standing up from a chair using your arms (e.g., wheelchair or bedside chair)?: Total Help needed to walk in hospital room?: Total Help needed climbing 3-5 steps with a railing? : Total 6 Click Score: 8    End of Session Equipment Utilized During Treatment: Gait belt Activity Tolerance: Patient tolerated treatment well;Patient limited by pain Patient left: in bed;with call bell/phone within reach;with bed alarm set Nurse Communication: Mobility status PT Visit Diagnosis: Unsteadiness on feet (R26.81);Muscle weakness (generalized) (M62.81);Hemiplegia and hemiparesis Hemiplegia - Right/Left: Left Hemiplegia - dominant/non-dominant: Non-dominant Hemiplegia - caused by: Cerebral infarction     Time: 9091-9064 PT Time Calculation (min) (ACUTE ONLY): 27 min  Charges:    $Therapeutic Activity: 23-37 mins PT General Charges $$ ACUTE PT VISIT: 1 Visit                     Darryle George,  PTA Acute Rehabilitation Services Secure Chat Preferred  [phone]    Darryle George 02/23/2024, 10:14 AM

## 2024-02-23 NOTE — Progress Notes (Signed)
 Speech Language Pathology Treatment: Dysphagia  Patient Details Name: Christina Bennett MRN: [id number] DOB: 01/10/1941 Today's Date: 02/23/2024 Time: 9164-9097 SLP Time Calculation (min) (ACUTE ONLY): 27 min  Assessment / Plan / Recommendation Clinical Impression  Pt seen for ongoing dysphagia management.  On SLP arrival pt was drinking unthickened chocolate milk by straw.  Pt with thickened drinks with ice in room as well.  Pt refusing all thickened liquids.  Provided education regarding recommendations of MBSS and regarding aspiration pneumonia. When given a choice, pt states she would prefer to focus on enjoyment of POs rather than on getting stronger and recovering.  Palliative medicine has worked with pt this admission, but comfort care was not pursued.  Today pt exhibited immediate cough with 1 of 3 spoon amounts of water . She exhibited coughing and throat clearing with both nectar and honey thick consistencies as well.  Pt is having difficulty following recommendations and I do not believe current diet with nectar thick liquids is safe.  Nursing reports pt has been unable to follow safety precautions with ice chips, sips of water . They report coughing with current diet.  Despite clinical s/s of aspiration with HTL, there has been no penetration/aspiration of this consistency on her swallow studies.  Will change diet to puree with honey/moderately thick liquids.  Will plan for repeat MBSS next date now that NGT has been removed to assess for any change in swallow function.  Recommend puree diet with honey thick liquids.     HPI HPI: Patient is an 83 y.o. female with PMH: CAD, COPD, essential HTN, CKD stage III, morbid obesity. She presented to the Ed on 02/03/24 with multiple complaints inclucding generalized weakeness, HA, poor appetite, chest congestion, blurry vision and family reporting change in her speech. ED evaluation found her to be hemodynamically stable but with BP up to 183/86, UA  showed evidence of UTI. CT head negative for acute intracranial abnormality or hemorrhage but did show moderate periventricular WM changes likely reflecting the sequela of small vessel ischemia. CXR showed diffuse interstitial opacities, R>L. While in ED, patient became very restless, anxious and fidgety. Despite multiple doses of Ativan  to attempt MRI,, it was canceled due to patient not cooperating. Non-violent restraints placed. SLP swallow evalauation ordered secondary to family reports of swallowing difficulities as well as RN observing patient to not be able to swallow even smaller pills, and coughing with liquids.  MBSS 6/25 with recs for D3/NTL. Pt with code stroke overnight and reassessed 6/26 with change in swallow function.  PEG planned 7/11 due to pt's poor po.  MBS indicated to assess readiness for dietary advancement.      SLP Plan  MBS          Recommendations  Diet recommendations: Dysphagia 1 (puree);Honey-thick liquid Medication Administration: Crushed with puree Supervision: Trained caregiver to feed patient Compensations: Slow rate;Small sips/bites Postural Changes and/or Swallow Maneuvers: Seated upright 90 degrees;Upright 30-60 min after meal                  Oral care BID;Oral care prior to ice chip/H20   Frequent or constant Supervision/Assistance Dysphagia, oropharyngeal phase (R13.12)     MBS     Anette FORBES Grippe, MA, CCC-SLP Acute Rehabilitation Services Office: [phone] 02/23/2024, 9:13 AM

## 2024-02-23 NOTE — Plan of Care (Signed)
  Problem: Education: Goal: Ability to describe self-care measures that may prevent or decrease complications (Diabetes Survival Skills Education) will improve Outcome: Progressing Goal: Individualized Educational Video(s) Outcome: Progressing   Problem: Coping: Goal: Ability to adjust to condition or change in health will improve Outcome: Progressing   Problem: Fluid Volume: Goal: Ability to maintain a balanced intake and output will improve Outcome: Progressing   Problem: Health Behavior/Discharge Planning: Goal: Ability to identify and utilize available resources and services will improve Outcome: Progressing Goal: Ability to manage health-related needs will improve Outcome: Progressing   Problem: Metabolic: Goal: Ability to maintain appropriate glucose levels will improve Outcome: Progressing   Problem: Nutritional: Goal: Maintenance of adequate nutrition will improve Outcome: Progressing Goal: Progress toward achieving an optimal weight will improve Outcome: Progressing   Problem: Skin Integrity: Goal: Risk for impaired skin integrity will decrease Outcome: Progressing   Problem: Tissue Perfusion: Goal: Adequacy of tissue perfusion will improve Outcome: Progressing   Problem: Education: Goal: Knowledge of General Education information will improve Description: Including pain rating scale, medication(s)/side effects and non-pharmacologic comfort measures Outcome: Progressing   Problem: Health Behavior/Discharge Planning: Goal: Ability to manage health-related needs will improve Outcome: Progressing   Problem: Clinical Measurements: Goal: Ability to maintain clinical measurements within normal limits will improve Outcome: Progressing Goal: Will remain free from infection Outcome: Progressing Goal: Diagnostic test results will improve Outcome: Progressing Goal: Respiratory complications will improve Outcome: Progressing Goal: Cardiovascular complication will  be avoided Outcome: Progressing   Problem: Activity: Goal: Risk for activity intolerance will decrease Outcome: Progressing   Problem: Nutrition: Goal: Adequate nutrition will be maintained Outcome: Progressing   Problem: Coping: Goal: Level of anxiety will decrease Outcome: Progressing   Problem: Elimination: Goal: Will not experience complications related to bowel motility Outcome: Progressing Goal: Will not experience complications related to urinary retention Outcome: Progressing   Problem: Pain Managment: Goal: General experience of comfort will improve and/or be controlled Outcome: Progressing   Problem: Safety: Goal: Ability to remain free from injury will improve Outcome: Progressing   Problem: Skin Integrity: Goal: Risk for impaired skin integrity will decrease Outcome: Progressing   Problem: Activity: Goal: Ability to tolerate increased activity will improve Outcome: Progressing   Problem: Clinical Measurements: Goal: Ability to maintain a body temperature in the normal range will improve Outcome: Progressing   Problem: Respiratory: Goal: Ability to maintain adequate ventilation will improve Outcome: Progressing Goal: Ability to maintain a clear airway will improve Outcome: Progressing   Problem: Education: Goal: Knowledge of disease or condition will improve Outcome: Progressing Goal: Knowledge of secondary prevention will improve (MUST DOCUMENT ALL) Outcome: Progressing Goal: Knowledge of patient specific risk factors will improve (DELETE if not current risk factor) Outcome: Progressing   Problem: Ischemic Stroke/TIA Tissue Perfusion: Goal: Complications of ischemic stroke/TIA will be minimized Outcome: Progressing   Problem: Coping: Goal: Will verbalize positive feelings about self Outcome: Progressing Goal: Will identify appropriate support needs Outcome: Progressing   Problem: Health Behavior/Discharge Planning: Goal: Ability to manage  health-related needs will improve Outcome: Progressing Goal: Goals will be collaboratively established with patient/family Outcome: Progressing   Problem: Self-Care: Goal: Ability to participate in self-care as condition permits will improve Outcome: Progressing Goal: Verbalization of feelings and concerns over difficulty with self-care will improve Outcome: Progressing Goal: Ability to communicate needs accurately will improve Outcome: Progressing   Problem: Nutrition: Goal: Risk of aspiration will decrease Outcome: Progressing Goal: Dietary intake will improve Outcome: Progressing

## 2024-02-23 NOTE — TOC Progression Note (Addendum)
 Transition of Care [facility]) - Progression Note    Patient Details  Name: Christina Bennett MRN: [id number] Date of Birth: 03-07-1941  [facility] CM/SW Contact  Isaiah Public, LCSWA Phone Number: 02/23/2024, 12:32 PM  Clinical Narrative:     CSW LVM for patients niece [name]. CSW awaiting call back to discuss SNF choice. CSW will continue to follow.  Update- CSW spoke with patients niece [name] and provided SNF bed offers for patient. Patients niece [name] accepted SNF bed offer with [facility]. Kia informed CSW that facility will have SNF bed for patient on Wednesday if medically stable. CSW requested for Jon CMA to start insurance authorization for patient. CSW will continue to follow.   Update-Patients insurance authorization is approved 7/15 7/17 [employer] [place] Auth [id number].  Expected Discharge Plan: Skilled Nursing Facility Barriers to Discharge: Continued Medical Work up, English as a second language teacher  Expected Discharge Plan and Services In-house Referral: Clinical Social Work     Living arrangements for the past 2 months: Single Family Home                                       Social Determinants of Health (SDOH) Interventions SDOH Screenings   Food Insecurity: Patient Unable To Answer (02/03/2024)  Housing: Unknown (02/03/2024)  Transportation Needs: Patient Unable To Answer (02/03/2024)  Utilities: Patient Unable To Answer (02/03/2024)  Social Connections: Patient Unable To Answer (02/03/2024)  Tobacco Use: Medium Risk (02/03/2024)    Readmission Risk Interventions     No data to display

## 2024-02-23 NOTE — Progress Notes (Signed)
 Progress Note   Patient: Christina Bennett FMW:[id number] DOB: 02-14-1941 DOA: 02/02/2024     20 DOS: the patient was seen and examined on 02/23/2024   Brief hospital course:  83 year old with past medical history significant for COPD, obesity, CAD, CKD 3A who presents with dizziness, blurry vision, headache, chest congestion, speech disturbance.  EMS was called and patient was brought to the hospital on her initial physical examination blood pressure was 122/63.  Chest x-ray with hypoinflation and mild cardiomegaly.  Patient was agitated in the ED and received multiple medications Ativan , droperidol  Geodon  and Haldol .  After admission she developed acute left-sided weakness overnight and code stroke was called received TNK and transferred to ICU.  CT head no acute abnormality.  CT angio head severe proximal right P2 stenosis, moderate stenosis of the origin of the vertebral artery.  MRI showed right pontine infarct.  Transferred [facility] 6/[facility].  As of 7/10 she is very weak and deconditioned. Underwent Peg placement 7/11.  Pending SNF placement.  Assessment and Plan:  -Acute Ischemic nonhemorrhagic right pontine infarct, POA: -MRI brain showed small pontine infarct. Post-stroke course complicated by dysphagia. Non-invasive angiography showed focal severe right P2 stenosis and moderate vertebral right stenosis.  -Echocardiogram showed no cardiogenic source of embolism. -Carotid imaging unremarkable.   -Acute metabolic encephalopathy multifactorial, delirium.  - Continue with Plavix  and statin -Status post PEG tube placement on 02/20/2024 -Neurology consulted   Acute on chronic diastolic heart failure, POA: NYHA class III Acute hypoxemic respiratory failure due to acute cardiogenic pulmonary edema,resolved now.  Ruled out for pneumonia.  -ECHO Ef 55 % - Continue with Lasix  20 mg daily with close monitoring of the renal function panel along with strict intake output monitoring.     AKI on CKD  stage IIIa: Creatinine is at baseline now Continue with low-dose Lasix  orally  Essential hypertension: Continue Losartan .    History of CAD: Continue with plavix    Iron deficiency anemia: No acute issues, follow-up H&H closely   COPD: Not in exacerbation. Continue with as needed inhalation bronchodilator therapy   Diabetes melitis type 2 with hyperglycemia: Continue with sliding scale insulin  along with glargine 20 units subcutaneous daily   Moderate protein calorie malnutrition Dysphagia Underwent peg tube placement 7/11 Also started on Dysphagia diet.  Speech therapy on board and patient is currently on pured diet with honey thick liquids.    Class II obesity: As evidenced by BMI of 35.4.     Subjective: She wanted to have chocolate milk. Speech therapist was in the room as well. Patient was advised on the importance of consuming thickened milk to reduce the risk of aspiration.  Physical Exam: Vitals:   02/22/24 2354 02/23/24 0401 02/23/24 0757 02/23/24 0907  BP: (!) 120/53 (!) 133/56 (!) 158/78   Pulse: 73 71 77 82  Resp: 18 18 18 18   Temp: 98.4 F (36.9 C) 98 F (36.7 C) 99 F (37.2 C)   TempSrc: Oral  Oral   SpO2: 93% 98% 96% 96%  Weight:      Height:       Constitutional: NAD, calm, comfortable Eyes: PERRL, lids and conjunctivae normal ENMT: Mucous membranes are moist. Posterior pharynx clear of any exudate or lesions.Normal dentition.  Neck: normal, supple, no masses, no thyromegaly Respiratory: clear to auscultation bilaterally, no wheezing, no crackles. Normal respiratory effort. No accessory muscle use.  Cardiovascular: Regular rate and rhythm, no murmurs / rubs / gallops. No extremity edema. 2+ pedal pulses. No carotid  bruits.  Abdomen: no tenderness, no masses palpated. No hepatosplenomegaly. Bowel sounds positive.  Musculoskeletal: no clubbing / cyanosis. No joint deformity upper and lower extremities. Good ROM, no contractures. Normal muscle tone.   Skin: no rashes, lesions, ulcers. No induration Neurologic: CN 2-12 grossly intact. Sensation intact, DTR normal. Strength 5/5 x all 4 extremities.  Psychiatric: Normal judgment and insight. Alert and oriented x 3. Normal mood.   Data Reviewed:  There are no new results to review at this time.  Family Communication: None at the bedside  Disposition: Status is: Inpatient Remains inpatient appropriate because: CHF, Stroke  Planned Discharge Destination: Skilled nursing facility    Time spent: 41 minutes  Author: Deliliah Room, MD 02/23/2024 10:59 AM  For on call review [email or web address].

## 2024-02-23 NOTE — Plan of Care (Signed)
 Pt doing well. Will have another MBS in the AM. DC to facility pending Wednesday.  Problem: Education: Goal: Ability to describe self-care measures that may prevent or decrease complications (Diabetes Survival Skills Education) will improve Outcome: Progressing   Problem: Coping: Goal: Ability to adjust to condition or change in health will improve Outcome: Progressing   Problem: Fluid Volume: Goal: Ability to maintain a balanced intake and output will improve Outcome: Progressing

## 2024-02-24 ENCOUNTER — Inpatient Hospital Stay (HOSPITAL_COMMUNITY)

## 2024-02-24 DIAGNOSIS — I639 Cerebral infarction, unspecified: Secondary | ICD-10-CM | POA: Diagnosis not present

## 2024-02-24 LAB — GLUCOSE, CAPILLARY
Glucose-Capillary: 252 mg/dL — ABNORMAL HIGH (ref 70–99)
Glucose-Capillary: 279 mg/dL — ABNORMAL HIGH (ref 70–99)
Glucose-Capillary: 233 mg/dL — ABNORMAL HIGH (ref 70–99)
Glucose-Capillary: 209 mg/dL — ABNORMAL HIGH (ref 70–99)

## 2024-02-24 MED ORDER — INSULIN GLARGINE-YFGN 100 UNIT/ML ~~LOC~~ SOLN
22.0000 [IU] | SUBCUTANEOUS | Status: DC
  Administered 2024-02-25: 22 [IU] via SUBCUTANEOUS
  Filled 2024-02-24: qty 0.22

## 2024-02-24 MED ORDER — INSULIN ASPART 100 UNIT/ML IJ SOLN
0.0000 [IU] | INTRAMUSCULAR | Status: DC
  Administered 2024-02-24: 3 [IU] via SUBCUTANEOUS
  Administered 2024-02-24: 5 [IU] via SUBCUTANEOUS
  Administered 2024-02-24: 3 [IU] via SUBCUTANEOUS
  Administered 2024-02-25 (×4): 2 [IU] via SUBCUTANEOUS

## 2024-02-24 MED ORDER — INSULIN ASPART 100 UNIT/ML IJ SOLN: 0.0000 [IU] | INTRAMUSCULAR | Status: DC | PRN

## 2024-02-24 MED ORDER — INSULIN ASPART 100 UNIT/ML IJ SOLN
3.0000 [IU] | INTRAMUSCULAR | Status: DC
  Administered 2024-02-24 – 2024-02-25 (×8): 3 [IU] via SUBCUTANEOUS

## 2024-02-24 NOTE — Plan of Care (Signed)
  Problem: Clinical Measurements: Goal: Will remain free from infection Outcome: Progressing Goal: Respiratory complications will improve Outcome: Progressing Goal: Cardiovascular complication will be avoided Outcome: Progressing   Problem: Nutrition: Goal: Risk of aspiration will decrease Outcome: Progressing   Problem: Skin Integrity: Goal: Risk for impaired skin integrity will decrease Outcome: Not Progressing   Problem: Pain Managment: Goal: General experience of comfort will improve and/or be controlled Outcome: Not Progressing   Problem: Activity: Goal: Ability to tolerate increased activity will improve Outcome: Not Progressing   Problem: Self-Care: Goal: Ability to participate in self-care as condition permits will improve Outcome: Not Progressing

## 2024-02-24 NOTE — Progress Notes (Signed)
 Modified Barium Swallow Study  Patient Details  Name: Christina Bennett MRN: [id number] Date of Birth: 1940-12-06  Today's Date: 02/24/2024  Modified Barium Swallow completed.  Full report located under Chart Review in the Imaging Section.  History of Present Illness Patient is an 83 y.o. female with PMH: CAD, COPD, essential HTN, CKD stage III, morbid obesity. She presented to the Ed on 02/03/24 with multiple complaints inclucding generalized weakeness, HA, poor appetite, chest congestion, blurry vision and family reporting change in her speech. ED evaluation found her to be hemodynamically stable but with BP up to 183/86, UA showed evidence of UTI. CT head negative for acute intracranial abnormality or hemorrhage but did show moderate periventricular WM changes likely reflecting the sequela of small vessel ischemia. CXR showed diffuse interstitial opacities, R>L. While in ED, patient became very restless, anxious and fidgety. Despite multiple doses of Ativan  to attempt MRI,, it was canceled due to patient not cooperating. Non-violent restraints placed. SLP swallow evalauation ordered secondary to family reports of swallowing difficulities as well as RN observing patient to not be able to swallow even smaller pills, and coughing with liquids.  MBSS 6/25 with recs for D3/NTL. Pt with code stroke overnight and reassessed 6/26 with change in swallow function.  PEG planned 7/11 due to pt's poor po.  MBS indicated to assess readiness for dietary advancement.  MBS conducted on 6/25 and 7/3 - Pt is s/p PEG and repeat MBS indicated.   Clinical Impression Patient continues with moderately severe oropharyngeal dysphagia with ongoing sensorimotor deficits.  Oral manipulation/transiting with liquids/puree weas improved.  However pt continues with compromised mastication with moistened cracker - with anterior vertical mastication pattern. Once transited into pharynx, cracker and puree swallow triggered at vallecula  without retention noted.  Pharyngeal swallow reflex noted at larynx with thin and nectar as barium spills over epiglottis- with ongoing posterior trachea aspiration of nectar producing cough response (PAS 7).  No aspiration of thin via cup - but pt continues with deep laryngeal penetration.  No aspiration or penetration of thin via tsp noted.   At this time, recommend continue current diet of dys1/honey thick liquids. She did throat clear/cough on command - but needs max encouragement for participation during MBS as she began complaining of hip pain. She will benefit from aggressive SLP follow up at next venue of care for dysphagia management. Factors that may increase risk of adverse event in presence of aspiration Noe [employer] Lianne 2021): Weak cough;Reduced cognitive function;Dependence for feeding and/or oral hygiene;Limited mobility;Frail or deconditioned;Inadequate oral hygiene;Reduced saliva  Swallow Evaluation Recommendations Recommendations: PO diet PO Diet Recommendation: Dysphagia 1 (Pureed);Moderately thick liquids (Level 3, honey thick) Medication Administration: Crushed with puree Supervision: Other (comment);Staff to assist with self-feeding Swallowing strategies  : Slow rate;Small bites/sips Postural changes: Position pt fully upright for meals;Stay upright 30-60 min after meals Oral care recommendations: Oral care QID (4x/day)  [name], MS [facility] SLP Acute Rehab Services Office [phone]     [name] 02/24/2024,11:21 AM

## 2024-02-24 NOTE — Progress Notes (Signed)
 Speech Language Pathology Treatment: Dysphagia  Patient Details Name: Christina Bennett MRN: [id number] DOB: 29-Sep-1940 Today's Date: 02/24/2024 Time: 8854-8844 SLP Time Calculation (min) (ACUTE ONLY): 10 min  Assessment / Plan / Recommendation Clinical Impression  SLP saw pt t follow up regarding her MBS findings and to implement Proval cup for independent thin liquid intake based on MBS.  Pt provided with cold soda via Provale cup = Cough both immediate and delayed noted - with approx 40% of po trials - even with pt attempt at chin tuck posture. Pt's anterior position of epiglottis allows liquids to spill into larynx prior to swallow trigger and despite pt's best effort, she continues to cough with thin.  Pt benefits from max cues to clear her throat strongly and cough to remove aspirates/penetrates.  SLP then removed soda from cup and provided pt with cold water .  She only took 1 bolus via Provale cup - without cough response - before declining to consume more water .  Anticipate pt will have ongoing aspiration of water  but thin water  after mouth care can be absorbed and will maintain pt's system swallowing thin water .   Pt able to articulate that coughing with intake indicates she aspirates - and she needs to cough to clear.   Of note, pt again reports baseline occasional coughing with thin liquids PTA - and no family present to confirm.    She will continue to require full assist with meals but able to use her Provale cup independently.   Posted swallow precaution signs and information re: cup in the pt's room.     HPI HPI: Patient is an 83 y.o. female with PMH: CAD, COPD, essential HTN, CKD stage III, morbid obesity. She presented to the Ed on 02/03/24 with multiple complaints inclucding generalized weakeness, HA, poor appetite, chest congestion, blurry vision and family reporting change in her speech. ED evaluation found her to be hemodynamically stable but with BP up to 183/86, UA showed evidence  of UTI. CT head negative for acute intracranial abnormality or hemorrhage but did show moderate periventricular WM changes likely reflecting the sequela of small vessel ischemia. CXR showed diffuse interstitial opacities, R>L. While in ED, patient became very restless, anxious and fidgety. Despite multiple doses of Ativan  to attempt MRI,, it was canceled due to patient not cooperating. Non-violent restraints placed. SLP swallow evalauation ordered secondary to family reports of swallowing difficulities as well as RN observing patient to not be able to swallow even smaller pills, and coughing with liquids.  MBSS 6/25 with recs for D3/NTL. Pt with code stroke overnight and reassessed 6/26 with change in swallow function.  PEG planned 7/11 due to pt's poor po.  MBS indicated to assess readiness for dietary advancement.  MBS conducted on 6/25 and 7/3 - Pt is s/p PEG and repeat MBS conducted 02/24/2024.      SLP Plan  Continue with current plan of care          Recommendations  Medication Administration: Crushed with puree Supervision:  (pt can do water  via provale cup independently) Compensations: Slow rate;Small sips/bites Postural Changes and/or Swallow Maneuvers: Seated upright 90 degrees;Upright 30-60 min after meal                  Oral care BID;Oral care prior to ice chip/H20   Frequent or constant Supervision/Assistance Dysphagia, oropharyngeal phase (R13.12)     Continue with current plan of care   [name], MS [facility] SLP Acute Rehab Services Office [phone]  [name]  02/24/2024, 12:11 PM

## 2024-02-24 NOTE — Progress Notes (Signed)
 Progress Note   Patient: Christina Bennett FMW:[id number] DOB: April 12, 1941 DOA: 02/02/2024     21 DOS: the patient was seen and examined on 02/24/2024   Brief hospital course:  83 year old with past medical history significant for COPD, obesity, CAD, CKD 3A who presents with dizziness, blurry vision, headache, chest congestion, speech disturbance.  EMS was called and patient was brought to the hospital on her initial physical examination blood pressure was 122/63.  Chest x-ray with hypoinflation and mild cardiomegaly.  Patient was agitated in the ED and received multiple medications Ativan , droperidol  Geodon  and Haldol .  After admission she developed acute left-sided weakness overnight and code stroke was called received TNK and transferred to ICU.  CT head no acute abnormality.  CT angio head severe proximal right P2 stenosis, moderate stenosis of the origin of the vertebral artery.  MRI showed right pontine infarct.  Transferred to [facility] 6/28.  As of 7/10 she is very weak and deconditioned. Underwent Peg placement 7/11.  Pending SNF placement.  Assessment and Plan:  -Acute Ischemic nonhemorrhagic right pontine infarct, POA: -MRI brain showed small pontine infarct. Post-stroke course complicated by dysphagia. Non-invasive angiography showed focal severe right P2 stenosis and moderate vertebral right stenosis.  -Echocardiogram showed no cardiogenic source of embolism. -Carotid imaging unremarkable.   -Acute metabolic encephalopathy multifactorial, delirium.  - Continue with Plavix  and statin -Status post PEG tube placement on 02/20/2024 -Neurology consulted   Acute on chronic diastolic heart failure, POA: NYHA class III Acute hypoxemic respiratory failure due to acute cardiogenic pulmonary edema,resolved now.  Ruled out for pneumonia.  -ECHO Ef 55 % - Continue with Lasix  20 mg daily with close monitoring of the renal function panel along with strict intake output monitoring.     AKI on CKD  stage IIIa: Creatinine is at baseline now Continue with low-dose Lasix  orally  Essential hypertension: Continue Losartan .    History of CAD: Continue with plavix    Iron deficiency anemia: No acute issues, follow-up H&H closely   COPD: Not in exacerbation. Continue with as needed inhalational bronchodilator therapy   Diabetes melitis type 2 with hyperglycemia: Continue with sliding scale insulin  along with glargine 22 units (increased from 20 on 7/15) subcutaneous daily   Moderate protein calorie malnutrition Dysphagia Underwent peg tube placement 7/11 Also started on Dysphagia diet.  Speech therapy on board and patient is currently on pured diet with honey thick liquids.    Class II obesity: As evidenced by BMI of 35.4.     Subjective: No acute events overnight.  Resting comfortably in the bed.  She is asking about the chocolate milk.  Physical Exam: Vitals:   02/24/24 0600 02/24/24 0700 02/24/24 0828 02/24/24 0842  BP:   129/79   Pulse:   77   Resp: 20 20 18    Temp:   97.8 F (36.6 C)   TempSrc:   Oral   SpO2:   94% 92%  Weight:      Height:       Constitutional: NAD, calm, comfortable Eyes: PERRL, lids and conjunctivae normal ENMT: Mucous membranes are moist. Posterior pharynx clear of any exudate or lesions.Normal dentition.  Neck: normal, supple, no masses, no thyromegaly Respiratory: clear to auscultation bilaterally, no wheezing, no crackles. Normal respiratory effort. No accessory muscle use.  Cardiovascular: Regular rate and rhythm, no murmurs / rubs / gallops. No extremity edema. 2+ pedal pulses. No carotid bruits.  Abdomen: no tenderness, no masses palpated. No hepatosplenomegaly. Bowel sounds positive.  Musculoskeletal: no  clubbing / cyanosis. No joint deformity upper and lower extremities. Good ROM, no contractures. Normal muscle tone.  Skin: no rashes, lesions, ulcers. No induration Neurologic: CN 2-12 grossly intact. Sensation intact, DTR normal.  Strength 5/5 x all 4 extremities.  Psychiatric: Normal judgment and insight. Alert and oriented x 3. Normal mood.   Data Reviewed:  There are no new results to review at this time.  Family Communication: None at the bedside  Disposition: Status is: Inpatient Remains inpatient appropriate because: CHF, Stroke  Planned Discharge Destination: Skilled nursing facility    Time spent: 41 minutes  Author: Deliliah Room, MD 02/24/2024 9:38 AM  For on call review [email or web address].

## 2024-02-24 NOTE — Progress Notes (Signed)
 Occupational Therapy Treatment Patient Details Name: Christina Bennett MRN: [id number] DOB: Dec 06, 1940 Today's Date: 02/24/2024   History of present illness The pt is an 83 yo female presenting 6/23 with AMS. Initial work up with concern for UTI, pt given antibiotics and returned to baseline. Code stroke called 1AM 6/26, pt given TNK and transferred to ICU. 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 PEG placement 7/11. PMH includes: COPD, DM II, DVT, HTN, arthritis.   OT comments  Patient received in supine and declining EOB due to complaints of left hip pain and expecting transport for swallow study. Patient performed rolling in bed with mod assist to address left hip pain with change of positioning. Patient demonstrated gains with grooming with supervision and initiation cue to wash face and comb hair. LUE ROM addressed with patient stating pain with LUE shoulder flexion but decreased pain with abduction. Patient able to perform AAROM for elbow flexion and extension with gravity eliminated. Patient demonstrating finger flexion and extension but unable to actively move thumb. Session ended with the arrival of transport. Patient will benefit from continued inpatient follow up therapy, <3 hours/day. Acute OT to continue to follow to address established goals to facilitate DC to next venue of care.        If plan is discharge home, recommend the following:  Two people to help with walking and/or transfers;A lot of help with bathing/dressing/bathroom;Assistance with feeding;Assistance with Char[employer] fundraiser cushion (measurements OT);Wheelchair (measurements OT);Hoyer lift    Recommendations for [employer]      Precautions / Restrictions Precautions Precautions: Fall Recall of Precautions/Restrictions: Impaired Precaution/Restrictions Comments: SBP  <180 Restrictions Weight Bearing Restrictions Per Provider Order: No       Mobility Bed Mobility Overal bed mobility: Needs Assistance Bed Mobility: Rolling Rolling: Mod assist, Used rails         General bed mobility comments: rolling performed in bed to allow for positioned due to complaints of Left hip pain    Transfers Overall transfer level: Needs assistance                 General transfer comment: deferred due to expecting transportation     Balance                                           ADL either performed or assessed with clinical judgement   ADL Overall ADL's : Needs assistance/impaired     Grooming: Wash/dry face;Brushing hair;Supervision/safety;Bed level Grooming Details (indicate cue type and reason): verbal cues to initiate                               General ADL Comments: focused on grooming and LUE ROM    Extremity/Trunk Assessment Upper Extremity Assessment Upper Extremity Assessment: LUE deficits/detail LUE Deficits / Details: pain at LUE shoulder with shoulder flexion, active movement at and hand but difficult against gravity LUE: Shoulder pain with ROM LUE Sensation: WNL LUE Coordination: decreased fine motor;decreased gross motor            Vision       Perception     Praxis     Communication Communication Communication: No apparent difficulties   Cognition Arousal: Alert  Behavior During Therapy: WFL for tasks assessed/performed Cognition: Cognition impaired     Awareness: Online awareness impaired   Attention impairment (select first level of impairment): Sustained attention Executive functioning impairment (select all impairments): Initiation, Organization, Sequencing, Reasoning, Problem solving                   Following commands: Impaired Following commands impaired: Follows one step commands with increased time      Cueing   Cueing Techniques: Verbal cues, Tactile  cues  Exercises Exercises: General Upper Extremity General Exercises - Upper Extremity Shoulder Flexion: AAROM, Left, 5 reps, Supine Shoulder ABduction: AAROM, Left, 10 reps, Supine Elbow Flexion: AAROM, Both, 10 reps, Supine Elbow Extension: AAROM, Left, 10 reps, Seated Digit Composite Flexion: AAROM, Left Composite Extension: AAROM, Left    Shoulder Instructions       General Comments      Pertinent Vitals/ Pain       Pain Assessment Pain Assessment: Faces Faces Pain Scale: Hurts even more Pain Location: LUE shoulder with ROM, left hip pain Pain Descriptors / Indicators: Grimacing, Guarding, Moaning Pain Intervention(s): Limited activity within patient's tolerance, Monitored during session, Repositioned  Home Living                                          Prior Functioning/Environment              Frequency  Min 2X/week        Progress Toward Goals  OT Goals(current goals can now be found in the care plan section)  Progress towards OT goals: Progressing toward goals (slow progress)  Acute Rehab OT Goals Patient Stated Goal: to be able to eat OT Goal Formulation: With patient Time For Goal Achievement: 03/09/24 Potential to Achieve Goals: Fair ADL Goals Pt Will Perform Eating: with set-up;sitting Pt Will Perform Grooming: with set-up;sitting Pt Will Perform Upper Body Dressing: with min assist;sitting Pt Will Perform Lower Body Dressing: with mod assist;sitting/lateral leans Pt Will Transfer to Toilet: with min assist;squat pivot transfer;bedside commode Additional ADL Goal #1: Pt will demonstrate mod i with splint wear  Plan      Co-evaluation                 AM-PAC OT 6 Clicks Daily Activity     Outcome Measure   Help from another person eating meals?: A Little Help from another person taking care of personal grooming?: A Little Help from another person toileting, which includes using toliet, bedpan, or urinal?: A  Lot Help from another person bathing (including washing, rinsing, drying)?: A Lot Help from another person to put on and taking off regular upper body clothing?: A Lot Help from another person to put on and taking off regular lower body clothing?: Total 6 Click Score: 13    End of Session    OT Visit Diagnosis: Unsteadiness on feet (R26.81);Muscle weakness (generalized) (M62.81);Cognitive communication deficit (R41.841);Hemiplegia and hemiparesis Symptoms and signs involving cognitive functions: Cerebral infarction Hemiplegia - Right/Left: Left Hemiplegia - dominant/non-dominant: Non-Dominant Hemiplegia - caused by: Cerebral infarction   Activity Tolerance Patient tolerated treatment well   Patient Left in bed;Other (comment) (left with transport)   Nurse Communication Mobility status        Time: 9067-9052 OT Time Calculation (min): 15 min  Charges: OT General Charges $OT Visit: 1 Visit OT Treatments $Therapeutic Exercise: 8-22 mins  Dick  Dusty, OTA Acute Rehabilitation Services  Office [phone]   Jeb LITTIE Dusty 02/24/2024, 10:00 AM

## 2024-02-24 NOTE — Plan of Care (Signed)
  Problem: Education: Goal: Ability to describe self-care measures that may prevent or decrease complications (Diabetes Survival Skills Education) will improve Outcome: Progressing   Problem: Coping: Goal: Ability to adjust to condition or change in health will improve Outcome: Progressing   Problem: Fluid Volume: Goal: Ability to maintain a balanced intake and output will improve Outcome: Progressing   Problem: Metabolic: Goal: Ability to maintain appropriate glucose levels will improve Outcome: Progressing   Problem: Nutritional: Goal: Maintenance of adequate nutrition will improve Outcome: Progressing Goal: Progress toward achieving an optimal weight will improve Outcome: Progressing   Problem: Skin Integrity: Goal: Risk for impaired skin integrity will decrease Outcome: Progressing   Problem: Tissue Perfusion: Goal: Adequacy of tissue perfusion will improve Outcome: Progressing   Problem: Education: Goal: Knowledge of General Education information will improve Description: Including pain rating scale, medication(s)/side effects and non-pharmacologic comfort measures Outcome: Progressing   Problem: Clinical Measurements: Goal: Ability to maintain clinical measurements within normal limits will improve Outcome: Progressing Goal: Will remain free from infection Outcome: Progressing Goal: Diagnostic test results will improve Outcome: Progressing Goal: Respiratory complications will improve Outcome: Progressing Goal: Cardiovascular complication will be avoided Outcome: Progressing   Problem: Nutrition: Goal: Adequate nutrition will be maintained Outcome: Progressing   Problem: Elimination: Goal: Will not experience complications related to bowel motility Outcome: Progressing Goal: Will not experience complications related to urinary retention Outcome: Progressing   Problem: Pain Managment: Goal: General experience of comfort will improve and/or be  controlled Outcome: Progressing   Problem: Safety: Goal: Ability to remain free from injury will improve Outcome: Progressing   Problem: Skin Integrity: Goal: Risk for impaired skin integrity will decrease Outcome: Progressing   Problem: Clinical Measurements: Goal: Ability to maintain a body temperature in the normal range will improve Outcome: Progressing   Problem: Respiratory: Goal: Ability to maintain adequate ventilation will improve Outcome: Progressing Goal: Ability to maintain a clear airway will improve Outcome: Progressing   Problem: Education: Goal: Knowledge of disease or condition will improve Outcome: Progressing Goal: Knowledge of secondary prevention will improve (MUST DOCUMENT ALL) Outcome: Progressing Goal: Knowledge of patient specific risk factors will improve (DELETE if not current risk factor) Outcome: Progressing   Problem: Ischemic Stroke/TIA Tissue Perfusion: Goal: Complications of ischemic stroke/TIA will be minimized Outcome: Progressing   Problem: Coping: Goal: Will verbalize positive feelings about self Outcome: Progressing   Problem: Self-Care: Goal: Verbalization of feelings and concerns over difficulty with self-care will improve Outcome: Progressing Goal: Ability to communicate needs accurately will improve Outcome: Progressing   Problem: Nutrition: Goal: Dietary intake will improve Outcome: Progressing   Problem: Health Behavior/Discharge Planning: Goal: Ability to identify and utilize available resources and services will improve Outcome: Not Progressing Goal: Ability to manage health-related needs will improve Outcome: Not Progressing   Problem: Health Behavior/Discharge Planning: Goal: Ability to manage health-related needs will improve Outcome: Not Progressing   Problem: Activity: Goal: Risk for activity intolerance will decrease Outcome: Not Progressing   Problem: Coping: Goal: Level of anxiety will  decrease Outcome: Not Progressing   Problem: Activity: Goal: Ability to tolerate increased activity will improve Outcome: Not Progressing   Problem: Health Behavior/Discharge Planning: Goal: Ability to manage health-related needs will improve Outcome: Not Progressing   Problem: Self-Care: Goal: Ability to participate in self-care as condition permits will improve Outcome: Not Progressing   Problem: Nutrition: Goal: Risk of aspiration will decrease Outcome: Not Progressing

## 2024-02-24 NOTE — Progress Notes (Signed)
 Nutrition Follow-up  DOCUMENTATION CODES:  Non-severe (moderate) malnutrition in context of acute illness/injury  INTERVENTION:  Continue current diet as ordered Encourage PO intake  Continue tube feeding via PEG: Glucerna 1.5 at 45 ml/h (1080 ml per day) Flush with of free water  q4h Provides 1620 kcal, 89 gm protein, 820 ml free water  daily (TF+flush = 2022mL/d) Insulin  regimen adjusted by MD per DM coordinators recommendations  NUTRITION DIAGNOSIS:  Moderate Malnutrition related to poor appetite (in the context of acute illness) as evidenced by energy intake < 75% for > 7 days, mild muscle depletion, mild fat depletion. - progressing  GOAL:  Patient will meet greater than or equal to 90% of their needs - progressing  MONITOR:  PO intake, TF tolerance, Labs, Weight trends  REASON FOR ASSESSMENT:  Consult Enteral/tube feeding initiation and management  ASSESSMENT:  Pt with hx of CAD, COPD, HTN, HLD, CKD3, and DM type 2 presented to ED with AMS, poor appetite, and weakness.   6/23 - presented to ED 6/25 - MBS, DYS3, nectar liquids 6/26 - code stroke called, received TNK, CT negative, MRI showed R pontine infarct, repeat MBS: SLP recommended DYS2/honey thick 7/3 - cortrak placed, BSE, downgraded to DYS1/honey thick 7/10 - MBS, DYS 1 nectar thick 7/11 - PEG placed 7/14 - BSE. DYS 1/honey thick 7/15 - MBS, DYS 1/honey thick  Pt working with therapy at the time of assessment. Went down for MBS this AM, continues to have penetration and aspiration of thin and nectar this liquids. Diet remains unchanged at this time. Noted that TOC working on insurance authorization at this time, pt stable to discharge.   Noted continued high CBGs, DM coordinator continues to recommend TF coverage and adjustment of SSI insulin  to q4h to aid in management of intake from TF. Discussed with MD and orders adjusted.   Admit weight: 89.6 kg ? Accuracy, copied from last encounter in 2024 Current  weight: 88 kg    Average Meal Intake: 6/25-6/30: 22% intake x 6 recorded meals 7/5: 50% x 1 meal 7/9: 35% x 2 recorded meals  Nutritionally Relevant Medications: Scheduled Meds:  atorvastatin   40 mg Oral Daily   free water   200 mL Per Tube Q4H   furosemide   20 mg Oral Daily   insulin  aspart  0-5 Units Subcutaneous QHS   insulin  aspart  0-9 Units Subcutaneous TID WC   [START ON 02/25/2024] insulin  glargine-yfgn  22 Units Subcutaneous Q24H   pantoprazole   40 mg Oral BID   polyethylene glycol  17 g Oral Daily   senna-docusate  1 tablet Oral Daily   Continuous Infusions:  feeding supplement (GLUCERNA 1.5 CAL) 1,000 mL (02/24/24 0815)   PRN Meds:. bisacodyl , calcium  carbonate, ondansetron , phenol  Labs Reviewed: CBG ranges from 219-236 mg/dL over the last 24 hours HgbA1c 9.2%  NUTRITION - FOCUSED PHYSICAL EXAM: Flowsheet Row Most Recent Value  Orbital Region Mild depletion  Upper Arm Region No depletion  Thoracic and Lumbar Region No depletion  Buccal Region Mild depletion  Temple Region No depletion  Clavicle Bone Region No depletion  Clavicle and Acromion Bone Region Mild depletion  Scapular Bone Region No depletion  Dorsal Hand Mild depletion  Patellar Region Mild depletion  Anterior Thigh Region Mild depletion  Posterior Calf Region Mild depletion  Edema (RD Assessment) Mild  Hair Reviewed  Eyes Reviewed  Mouth Reviewed  Skin Reviewed  Nails Reviewed    Diet Order:   Diet Order  DIET - DYS 1 Room service appropriate? Yes; Fluid consistency: Honey Thick  Diet effective now                   EDUCATION NEEDS:  Education needs have been addressed  Skin:  Skin Assessment: Reviewed RN Assessment  Last BM:  7/15 - type 6  Height:  Ht Readings from Last 1 Encounters:  02/03/24 5' 2 (1.575 m)    Weight:  Wt Readings from Last 1 Encounters:  02/20/24 88 kg    Ideal Body Weight:  50 kg  BMI:  Body mass index is 35.48 kg/m.  Estimated  Nutritional Needs:  Kcal:  1500-1700 kcal/d Protein:  75-90g/d Fluid:  1.5-1.7L/d    Christina Bennett, RD, LDN, CNSC Registered Dietitian II Please reach out via secure chat

## 2024-02-25 ENCOUNTER — Other Ambulatory Visit (HOSPITAL_COMMUNITY): Payer: Self-pay

## 2024-02-25 DIAGNOSIS — E785 Hyperlipidemia, unspecified: Secondary | ICD-10-CM | POA: Diagnosis not present

## 2024-02-25 DIAGNOSIS — I5032 Chronic diastolic (congestive) heart failure: Secondary | ICD-10-CM | POA: Diagnosis not present

## 2024-02-25 DIAGNOSIS — H168 Other keratitis: Secondary | ICD-10-CM | POA: Diagnosis not present

## 2024-02-25 DIAGNOSIS — Z931 Gastrostomy status: Secondary | ICD-10-CM | POA: Diagnosis not present

## 2024-02-25 DIAGNOSIS — I2510 Atherosclerotic heart disease of native coronary artery without angina pectoris: Secondary | ICD-10-CM | POA: Diagnosis not present

## 2024-02-25 DIAGNOSIS — N1831 Chronic kidney disease, stage 3a: Secondary | ICD-10-CM | POA: Diagnosis not present

## 2024-02-25 DIAGNOSIS — E1165 Type 2 diabetes mellitus with hyperglycemia: Secondary | ICD-10-CM | POA: Diagnosis not present

## 2024-02-25 DIAGNOSIS — I10 Essential (primary) hypertension: Secondary | ICD-10-CM | POA: Diagnosis not present

## 2024-02-25 DIAGNOSIS — Z86718 Personal history of other venous thrombosis and embolism: Secondary | ICD-10-CM | POA: Diagnosis not present

## 2024-02-25 DIAGNOSIS — J309 Allergic rhinitis, unspecified: Secondary | ICD-10-CM | POA: Diagnosis not present

## 2024-02-25 DIAGNOSIS — J449 Chronic obstructive pulmonary disease, unspecified: Secondary | ICD-10-CM | POA: Diagnosis not present

## 2024-02-25 DIAGNOSIS — D509 Iron deficiency anemia, unspecified: Secondary | ICD-10-CM | POA: Diagnosis not present

## 2024-02-25 DIAGNOSIS — E440 Moderate protein-calorie malnutrition: Secondary | ICD-10-CM | POA: Diagnosis not present

## 2024-02-25 DIAGNOSIS — I639 Cerebral infarction, unspecified: Secondary | ICD-10-CM | POA: Diagnosis not present

## 2024-02-25 DIAGNOSIS — I69391 Dysphagia following cerebral infarction: Secondary | ICD-10-CM | POA: Diagnosis not present

## 2024-02-25 DIAGNOSIS — E1169 Type 2 diabetes mellitus with other specified complication: Secondary | ICD-10-CM | POA: Diagnosis not present

## 2024-02-25 DIAGNOSIS — H02053 Trichiasis without entropian right eye, unspecified eyelid: Secondary | ICD-10-CM | POA: Diagnosis not present

## 2024-02-25 DIAGNOSIS — M159 Polyosteoarthritis, unspecified: Secondary | ICD-10-CM | POA: Diagnosis not present

## 2024-02-25 DIAGNOSIS — I69822 Dysarthria following other cerebrovascular disease: Secondary | ICD-10-CM | POA: Diagnosis not present

## 2024-02-25 DIAGNOSIS — I63541 Cerebral infarction due to unspecified occlusion or stenosis of right cerebellar artery: Secondary | ICD-10-CM | POA: Diagnosis not present

## 2024-02-25 DIAGNOSIS — K219 Gastro-esophageal reflux disease without esophagitis: Secondary | ICD-10-CM | POA: Diagnosis not present

## 2024-02-25 DIAGNOSIS — I517 Cardiomegaly: Secondary | ICD-10-CM | POA: Diagnosis not present

## 2024-02-25 DIAGNOSIS — M6281 Muscle weakness (generalized): Secondary | ICD-10-CM | POA: Diagnosis not present

## 2024-02-25 DIAGNOSIS — E559 Vitamin D deficiency, unspecified: Secondary | ICD-10-CM | POA: Diagnosis not present

## 2024-02-25 DIAGNOSIS — J410 Simple chronic bronchitis: Secondary | ICD-10-CM | POA: Diagnosis not present

## 2024-02-25 LAB — CBC WITH DIFFERENTIAL/PLATELET
Abs Immature Granulocytes: 0.09 K/uL — ABNORMAL HIGH (ref 0.00–0.07)
Basophils Absolute: 0.1 K/uL (ref 0.0–0.1)
Basophils Relative: 0 %
Eosinophils Absolute: 0.6 K/uL — ABNORMAL HIGH (ref 0.0–0.5)
Eosinophils Relative: 5 %
HCT: 38.2 % (ref 36.0–46.0)
Hemoglobin: 11.5 g/dL — ABNORMAL LOW (ref 12.0–15.0)
Immature Granulocytes: 1 %
Lymphocytes Relative: 20 %
Lymphs Abs: 2.6 K/uL (ref 0.7–4.0)
MCH: 25.5 pg — ABNORMAL LOW (ref 26.0–34.0)
MCHC: 30.1 g/dL (ref 30.0–36.0)
MCV: 84.7 fL (ref 80.0–100.0)
Monocytes Absolute: 1.1 K/uL — ABNORMAL HIGH (ref 0.1–1.0)
Monocytes Relative: 9 %
Neutro Abs: 8.1 K/uL — ABNORMAL HIGH (ref 1.7–7.7)
Neutrophils Relative %: 65 %
Platelets: 206 K/uL (ref 150–400)
RBC: 4.51 MIL/uL (ref 3.87–5.11)
RDW: 18.4 % — ABNORMAL HIGH (ref 11.5–15.5)
WBC: 12.5 K/uL — ABNORMAL HIGH (ref 4.0–10.5)
nRBC: 0 % (ref 0.0–0.2)

## 2024-02-25 LAB — GLUCOSE, CAPILLARY
Glucose-Capillary: 175 mg/dL — ABNORMAL HIGH (ref 70–99)
Glucose-Capillary: 178 mg/dL — ABNORMAL HIGH (ref 70–99)
Glucose-Capillary: 184 mg/dL — ABNORMAL HIGH (ref 70–99)
Glucose-Capillary: 197 mg/dL — ABNORMAL HIGH (ref 70–99)

## 2024-02-25 LAB — BASIC METABOLIC PANEL WITH GFR
Anion gap: 14 (ref 5–15)
BUN: 56 mg/dL — ABNORMAL HIGH (ref 8–23)
CO2: 24 mmol/L (ref 22–32)
Calcium: 8.9 mg/dL (ref 8.9–10.3)
Chloride: 99 mmol/L (ref 98–111)
Creatinine, Ser: 1.02 mg/dL — ABNORMAL HIGH (ref 0.44–1.00)
GFR, Estimated: 55 mL/min — ABNORMAL LOW (ref >60)
Glucose, Bld: 178 mg/dL — ABNORMAL HIGH (ref 70–99)
Potassium: 4.2 mmol/L (ref 3.5–5.1)
Sodium: 137 mmol/L (ref 135–145)

## 2024-02-25 MED ORDER — GUAIFENESIN ER 600 MG PO TB12
600.0000 mg | ORAL_TABLET | Freq: Two times a day (BID) | ORAL | 0 refills | Status: AC | PRN
  Filled 2024-02-25: qty 20, 10d supply, fill #0

## 2024-02-25 MED ORDER — CLOPIDOGREL BISULFATE 75 MG PO TABS
75.0000 mg | ORAL_TABLET | Freq: Every day | ORAL | 0 refills | Status: AC
  Filled 2024-02-25: qty 30, 30d supply, fill #0

## 2024-02-25 MED ORDER — BUDESONIDE 0.25 MG/2ML IN SUSP: 0.2500 mg | Freq: Two times a day (BID) | RESPIRATORY_TRACT | Status: AC

## 2024-02-25 MED ORDER — FUROSEMIDE 20 MG PO TABS
20.0000 mg | ORAL_TABLET | Freq: Every day | ORAL | 0 refills | Status: AC
  Filled 2024-02-25: qty 30, 30d supply, fill #0

## 2024-02-25 MED ORDER — MELATONIN 5 MG PO TABS
5.0000 mg | ORAL_TABLET | Freq: Every evening | ORAL | 0 refills | Status: AC | PRN
  Filled 2024-02-25: qty 10, 10d supply, fill #0

## 2024-02-25 MED ORDER — PANTOPRAZOLE SODIUM 40 MG PO TBEC
40.0000 mg | DELAYED_RELEASE_TABLET | Freq: Two times a day (BID) | ORAL | 0 refills | Status: AC
  Filled 2024-02-25: qty 30, 15d supply, fill #0

## 2024-02-25 MED ORDER — PREDNISOLONE ACETATE 1 % OP SUSP
1.0000 [drp] | Freq: Every day | OPHTHALMIC | 0 refills | Status: AC
  Filled 2024-02-25: qty 5, 100d supply, fill #0

## 2024-02-25 MED ORDER — ARFORMOTEROL TARTRATE 15 MCG/2ML IN NEBU: 15.0000 ug | INHALATION_SOLUTION | Freq: Two times a day (BID) | RESPIRATORY_TRACT | Status: AC

## 2024-02-25 MED ORDER — SENNOSIDES-DOCUSATE SODIUM 8.6-50 MG PO TABS
1.0000 | ORAL_TABLET | Freq: Every evening | ORAL | 0 refills | Status: AC | PRN
  Filled 2024-02-25: qty 15, 15d supply, fill #0

## 2024-02-25 MED ORDER — ATORVASTATIN CALCIUM 40 MG PO TABS
40.0000 mg | ORAL_TABLET | Freq: Every day | ORAL | 0 refills | Status: AC
  Filled 2024-02-25: qty 30, 30d supply, fill #0

## 2024-02-25 MED ORDER — HYDROXYZINE HCL 10 MG PO TABS
10.0000 mg | ORAL_TABLET | Freq: Three times a day (TID) | ORAL | 0 refills | Status: AC | PRN
  Filled 2024-02-25: qty 15, 5d supply, fill #0

## 2024-02-25 MED ORDER — HYDROCODONE-ACETAMINOPHEN 7.5-325 MG PO TABS: 1.0000 | ORAL_TABLET | Freq: Four times a day (QID) | ORAL | 0 refills | Status: AC | PRN

## 2024-02-25 MED ORDER — SALINE SPRAY 0.65 % NA SOLN: 1.0000 | NASAL | Status: AC | PRN

## 2024-02-25 MED ORDER — LOSARTAN POTASSIUM 50 MG PO TABS
50.0000 mg | ORAL_TABLET | Freq: Every day | ORAL | 0 refills | Status: AC
  Filled 2024-02-25: qty 30, 30d supply, fill #0

## 2024-02-25 MED ORDER — IPRATROPIUM-ALBUTEROL 0.5-2.5 (3) MG/3ML IN SOLN: 3.0000 mL | Freq: Four times a day (QID) | RESPIRATORY_TRACT | Status: AC | PRN

## 2024-02-25 MED ORDER — CALCIUM CARBONATE ANTACID 500 MG PO CHEW
1.0000 | CHEWABLE_TABLET | ORAL | 0 refills | Status: AC | PRN
  Filled 2024-02-25: qty 15, 2d supply, fill #0

## 2024-02-25 MED ORDER — INSULIN ASPART 100 UNIT/ML IJ SOLN
3.0000 [IU] | INTRAMUSCULAR | 0 refills | Status: AC
  Filled 2024-02-25: qty 10, 56d supply, fill #0

## 2024-02-25 MED ORDER — ORAL CARE MOUTH RINSE: 15.0000 mL | OROMUCOSAL | Status: AC | PRN

## 2024-02-25 MED ORDER — FREE WATER
200.0000 mL | Status: AC
Start: 2024-02-25 — End: ?

## 2024-02-25 MED ORDER — INSULIN GLARGINE-YFGN 100 UNIT/ML ~~LOC~~ SOPN
22.0000 [IU] | PEN_INJECTOR | SUBCUTANEOUS | 1 refills | Status: AC
  Filled 2024-02-25: qty 9, 40d supply, fill #0

## 2024-02-25 MED ORDER — ONDANSETRON HCL 4 MG PO TABS
4.0000 mg | ORAL_TABLET | Freq: Four times a day (QID) | ORAL | 0 refills | Status: AC | PRN
  Filled 2024-02-25: qty 20, 5d supply, fill #0

## 2024-02-25 MED ORDER — PHENOL 1.4 % MT LIQD: 1.0000 | OROMUCOSAL | Status: AC | PRN

## 2024-02-25 NOTE — Discharge Summary (Addendum)
 Physician Discharge Summary   Patient: MURRELL ELIZONDO MRN: [id number] DOB: 1941/01/22  Admit date:     02/02/2024  Discharge date: 02/25/24  Discharge Physician: Deliliah Room   PCP: Ransom Other, MD   Recommendations at discharge:    Follow up with PCP in one week and neurology in one month Continue taking meds as prescribed.  Discharge Diagnoses:  Active Problems:  Acute ischemic CVA   Acute on chronic diastolic CHF (congestive heart failure) (HCC)   CKD stage 3a, GFR 45-59 ml/min (HCC)   Essential hypertension   History of CAD (coronary artery disease)   Iron deficiency anemia   COPD (chronic obstructive pulmonary disease) (HCC)   Type 2 diabetes mellitus with hyperlipidemia (HCC)   Malnutrition of moderate degree   Obesity, class 1   Dysphagia   Hospital Course:   83 year old with past medical history significant for COPD, obesity, CAD, CKD 3A who presents with dizziness, blurry vision, headache, chest congestion, speech disturbance. EMS was called and patient was brought to the hospital on her initial physical examination blood pressure was 122/63. Chest x-ray with hypoinflation and mild cardiomegaly. Patient was agitated in the ED and received multiple medications Ativan , droperidol  Geodon  and Haldol . After admission she developed acute left-sided weakness overnight and code stroke was called received TNK and transferred to ICU. CT head no acute abnormality. CT angio head severe proximal right P2 stenosis, moderate stenosis of the origin of the vertebral artery. MRI showed right pontine infarct. Transferred to [facility] 6/28. As of 7/10 she is very weak and deconditioned. Underwent Peg placement 7/11.   -Acute Ischemic nonhemorrhagic right pontine infarct: Not present on admission -MRI brain showed small pontine infarct. Post-stroke course complicated by dysphagia. Non-invasive angiography showed focal severe right P2 stenosis and moderate vertebral right stenosis.   -Echocardiogram showed no cardiogenic source of embolism. -Carotid imaging unremarkable.  - Continue with Plavix  and statin -Status post PEG tube placement on 02/20/2024 -Neurology consulted. Out patient follow up with them in a month.   Acute on chronic diastolic heart failure, POA: NYHA class III. Resolved Acute hypoxemic respiratory failure due to acute cardiogenic pulmonary edema,resolved now.  Ruled out pneumonia.  -ECHO Ef 55 % - Continue with Lasix  20 mg daily with close monitoring of the renal function panel along with strict intake output monitoring.     AKI on CKD stage IIIa: Creatinine is at baseline now Continue with low-dose Lasix  orally   Essential hypertension: Continue Losartan .    History of CAD: Continue with plavix    Iron deficiency anemia: No acute issues, follow-up H&H closely   COPD: Not in exacerbation. Continue with as needed inhalational bronchodilator therapy   Diabetes melitis type 2 with hyperglycemia: Continue with novolog  3 units Q4H along with glargine 22 units (increased from 20 on 7/15) subcutaneous daily with close monitoring of glucose levels.   Moderate protein calorie malnutrition Dysphagia Underwent peg tube placement 7/11, on glucerna.  Speech therapy on board and patient is currently on pured diet with honey thick liquids.        Consultants: neurology, speech, PT/OT, Dietician, IR Procedures performed: PEG tube placement  Disposition: Skilled nursing facility Diet recommendation:  Dysphagia type 1 Honey Thick Liquid, Tube feeds with glucerna 1.5 at 45 ml/hr, flush with 200 cc of free water  every 4 hours DISCHARGE MEDICATION: Allergies as of 02/25/2024       Reactions   Aspirin Swelling, Rash   Throat swells.   Bee Venom Anaphylaxis   Ibuprofen  Shortness Of Breath, Swelling   Throat swells.   Percocet [oxycodone-acetaminophen ] Itching        Medication List     STOP taking these medications    Breztri  Aerosphere  160-9-4.8 MCG/ACT Aero inhaler Generic drug: budesonide -glycopyrrolate -formoterol    glimepiride 2 MG tablet Commonly known as: AMARYL   simvastatin  40 MG tablet Commonly known as: ZOCOR    valACYclovir  1000 MG tablet Commonly known as: VALTREX        TAKE these medications    albuterol  108 (90 Base) MCG/ACT inhaler Commonly known as: VENTOLIN  HFA Inhale 2 puffs into the lungs every 4 (four) hours as needed for wheezing or shortness of breath.   arformoterol  15 MCG/2ML Nebu Commonly known as: BROVANA  Take 2 mLs (15 mcg total) by nebulization 2 (two) times daily.   artificial tears ophthalmic solution Place 1 drop into the left eye daily.   atorvastatin  40 MG tablet Commonly known as: LIPITOR Take 1 tablet (40 mg total) by mouth daily.   budesonide  0.25 MG/2ML nebulizer solution Commonly known as: PULMICORT  Take 2 mLs (0.25 mg total) by nebulization 2 (two) times daily.   calcium  carbonate 500 MG chewable tablet Commonly known as: TUMS - dosed in mg elemental calcium  Chew 1 tablet (200 mg of elemental calcium  total) by mouth every 2 (two) hours as needed for indigestion or heartburn.   clopidogrel  75 MG tablet Commonly known as: Plavix  Take 1 tablet (75 mg total) by mouth daily.   fluticasone 50 MCG/ACT nasal spray Commonly known as: FLONASE Place 2 sprays into both nostrils every evening.   free water  Soln Place 200 mLs into feeding tube every 4 (four) hours.   furosemide  20 MG tablet Commonly known as: LASIX  Take 1 tablet (20 mg total) by mouth daily.   guaiFENesin  600 MG 12 hr tablet Commonly known as: MUCINEX  Take 1 tablet (600 mg total) by mouth 2 (two) times daily as needed for cough or to loosen phlegm.   HYDROcodone -acetaminophen  7.5-325 MG tablet Commonly known as: NORCO Take 1 tablet by mouth every 6 (six) hours as needed for severe pain (pain score 7-10). What changed:  when to take this reasons to take this   hydrOXYzine  10 MG tablet Commonly  known as: ATARAX  Take 1 tablet (10 mg total) by mouth 3 (three) times daily as needed for anxiety or itching.   insulin  aspart 100 UNIT/ML injection Commonly known as: novoLOG  Inject 3 Units into the skin every 4 (four) hours.   insulin  glargine-yfgn 100 UNIT/ML injection Commonly known as: SEMGLEE  Inject 0.22 mLs (22 Units total) into the skin daily.   ipratropium-albuterol  0.5-2.5 (3) MG/3ML Soln Commonly known as: DUONEB Take 3 mLs by nebulization every 6 (six) hours as needed.   losartan  50 MG tablet Commonly known as: COZAAR  Take 1 tablet (50 mg total) by mouth daily. What changed:  medication strength how much to take   melatonin 5 MG Tabs Take 1 tablet (5 mg total) by mouth at bedtime as needed (insomnia).   montelukast  10 MG tablet Commonly known as: SINGULAIR  Take 10 mg by mouth every evening.   mouth rinse Liqd solution 15 mLs by Mouth Rinse route as needed (for oral care).   ondansetron  4 MG tablet Commonly known as: ZOFRAN  Take 1 tablet (4 mg total) by mouth every 6 (six) hours as needed for nausea.   pantoprazole  40 MG tablet Commonly known as: PROTONIX  Take 1 tablet (40 mg total) by mouth 2 (two) times daily. What changed: when to take  this   phenol 1.4 % Liqd Commonly known as: CHLORASEPTIC Use as directed 1 spray in the mouth or throat as needed for throat irritation / pain.   prednisoLONE  acetate 1 % ophthalmic suspension Commonly known as: PRED FORTE  Place 1 drop into the left eye daily at 12 noon.   senna-docusate 8.6-50 MG tablet Commonly known as: Senokot-S Take 1 tablet by mouth at bedtime as needed for mild constipation.   sodium chloride  0.65 % Soln nasal spray Commonly known as: OCEAN Place 1 spray into both nostrils as needed for congestion.   Vitamin D (Ergocalciferol) 1.25 MG (50000 UNIT) Caps capsule Commonly known as: DRISDOL Take 50,000 Units by mouth every 14 (fourteen) days.        Contact information for follow-up  providers     [facility]. Schedule an appointment as soon as possible for a visit in 1 month(s).   Specialty: Neurology Why: stroke clinic Contact information: [address] [place]  [address] [phone]             Contact information for after-discharge care     Destination     [facility] .   Service: Skilled Nursing Contact information: [address] [place]  [address] [phone]                    Discharge Exam: Filed Weights   02/17/24 0500 02/20/24 0500 02/24/24 0701  Weight: 86 kg 88 kg 86.7 kg   Constitutional: NAD, calm, comfortable Eyes: PERRL, lids and conjunctivae normal ENMT: Mucous membranes are moist. Posterior pharynx clear of any exudate or lesions.Normal dentition.  Neck: normal, supple, no masses, no thyromegaly Respiratory: clear to auscultation bilaterally, no wheezing, no crackles. Normal respiratory effort. No accessory muscle use.  Cardiovascular: Regular rate and rhythm, no murmurs / rubs / gallops. No extremity edema. 2+ pedal pulses. No carotid bruits.  Abdomen: no tenderness, no masses palpated. No hepatosplenomegaly. Bowel sounds positive. PEG tube in place Musculoskeletal: no clubbing / cyanosis. No joint deformity upper and lower extremities. Good ROM, no contractures. Normal muscle tone.  Skin: no rashes, lesions, ulcers. No induration Neurologic: CN 2-12 grossly intact. Sensation intact, DTR normal. Strength 4/5 x all 4 extremities.  Psychiatric: Normal judgment and insight. Alert and oriented x 3. Normal mood.    Condition at discharge: good  The results of significant diagnostics from this hospitalization (including imaging, microbiology, ancillary and laboratory) are listed below for reference.   Imaging Studies: DG Swallowing Func-Speech Pathology Result Date: 02/24/2024 CLINICAL DATA:  Dysphagia. Swallowing assessment after CVA.  EXAM: MODIFIED BARIUM SWALLOW TECHNIQUE: Different consistencies of barium were administered orally to the patient by the Speech Pathologist. Imaging of the pharynx was performed in the lateral projection. FLUOROSCOPY TIME:  Radiation Exposure Index (as provided by the fluoroscopic device): 14.23 mGy Kerma COMPARISON:  None Available. FINDINGS: Modified barium swallow was performed by the speech pathologist. Radiologist was not involved with this exam. Please refer to the Speech Pathology report for results and recommendations. IMPRESSION: Please refer to the Speech Pathologists report for complete details and recommendations. Electronically Signed   By: CHRISTELLA.  Shick M.D.   On: 02/24/2024 11:07   IR GASTROSTOMY TUBE MOD SED Result Date: 02/20/2024 INDICATION: This patient patient without soft a GIA stress fracture. Gastrostomy tube placement for nutritional support. EXAM: Fluoroscopic guided gastrostomy tube placement MEDICATIONS: 2 g Ancef ; Antibiotics were administered within 1 hour of the procedure. Glucagon  1 mg IV ANESTHESIA/SEDATION:  Moderate (conscious) sedation was employed during this procedure. A total of Versed  1.5 mg and Fentanyl  50 mcg was administered intravenously by the radiology nurse. Total intra-service moderate Sedation Time: 20 minutes. The patient's level of consciousness and vital signs were monitored continuously by radiology nursing throughout the procedure under my direct supervision. CONTRAST:  20 mL Omnipaque  300-administered into the gastric lumen. FLUOROSCOPY: Radiation Exposure Index (as provided by the fluoroscopic device): 25 mGy Kerma COMPLICATIONS: None immediate. PROCEDURE: Informed written consent was obtained from the patient after a thorough discussion of the procedural risks, benefits and alternatives. All questions were addressed. Maximal Sterile Barrier Technique was utilized including caps, mask, sterile gowns, sterile gloves, sterile drape, hand hygiene and skin  antiseptic. A timeout was performed prior to the initiation of the procedure. In a supine position, the epigastric region was prepped and draped in usual sterile fashion. Limited ultrasound of the right upper quadrant was performed in order to determine liver margin and evaluate anatomy. After administration of glucagon , the nasogastric tube was used to insufflate the stomach with room air. Under fluoroscopic guidance, the skin was anesthetized in sequential fashion in order to deployed 2 gastropexy sutures. Each suture was advanced through a small skin nick until the needle tip was identified within the stomach lumen and verified positioning by dripping contrast onto the posterior wall of the stomach under fluoroscopy. After deployment of the gastropexy sutures, a larger region of skin was infiltrated with 1% lidocaine  for the placement of the gastrostomy tube. Sharp and blunt dissection was then performed and an 18 gauge needle was then advanced under fluoroscopic guidance into the stomach lumen. Again positioning was verified by tripping contrast onto the posterior wall the stomach. A short Amplatz wire was then advanced and directed towards the duodenum. The needle was removed. A peel-away sheath was then advanced over the guidewire into the stomach. The 51 French gastrostomy tube was then advanced over the guidewire as the peel-away sheath was removed from the patient. Approximately 10 mL of saline was advanced into the retention balloon. The [facility] disc was closely applied to the skin. Retention suture applied. Contrast was injected into the catheter demonstrating intraluminal position within the stomach. Sterile dressing applied. IMPRESSION: Satisfactory placement of an 75 French gastrostomy tube under fluoroscopic guidance. Orders for use placed in the patient's chart. Electronically Signed   By: Cordella Banner   On: 02/20/2024 10:59   DG Swallowing Func-Speech Pathology Result Date: 02/19/2024 Table  formatting from the original result was not included. Modified Barium Swallow Study Patient Details Name: TIYONA DESOUZA MRN: [id number] Date of Birth: 1941-03-23 Today's Date: 02/19/2024 HPI/PMH: HPI: Patient is an 83 y.o. female with PMH: CAD, COPD, essential HTN, CKD stage III, morbid obesity. She presented to the Ed on 02/03/24 with multiple complaints inclucding generalized weakeness, HA, poor appetite, chest congestion, blurry vision and family reporting change in her speech. ED evaluation found her to be hemodynamically stable but with BP up to 183/86, UA showed evidence of UTI. CT head negative for acute intracranial abnormality or hemorrhage but did show moderate periventricular WM changes likely reflecting the sequela of small vessel ischemia. CXR showed diffuse interstitial opacities, R>L. While in ED, patient became very restless, anxious and fidgety. Despite multiple doses of Ativan  to attempt MRI,, it was canceled due to patient not cooperating. Non-violent restraints placed. SLP swallow evalauation ordered secondary to family reports of swallowing difficulities as well as RN observing patient to not be able to swallow even smaller pills,  and coughing with liquids.  MBSS 6/25 with recs for D3/NTL. Pt with code stroke overnight and reassessed 6/26 with change in swallow function.  PEG planned 7/11 due to pt's poor po.  MBS indicated to assess readiness for dietary advancement. Clinical Impression: Clinical Impression: Patient continues with moderately severe oropharyngeal dysphagia with ongoing sensorimotor deficits.  Her Cortrak feeding tube worsened her pharngeal retention compared to most recent MBS when she did not have a tube. She does not have sensation to pharyngeal retention during today's testing.   She continues with slower lingual transit and mastication with less lingual control and more oral residue.  She expectorated poorly masticated fig bar later in the MBS after she failed to swallow it  despite being offered puree to aid oral transiting.  Pt with premature spillage of barium into pharynx poorly controlled.  Trigger of swallow with thin with chin tuck was in larynx resulting in aspiration that produced cough response (PAS 7).  Nectar thick liquids with head neutral were also aspirated into posterior trach as boluses spilled over epiglottis into open larynx and did not clear with reflexive coughing (PAS 7).  Improved suction via straw noted with nectar liquids allowing nectar liquids to be consumed without aspiration with chin tuck posture.  Laryngeal vestibule closure and epiglottic inversion remain incomplete.  Pt benefied from verbal cues and counting 1,2,3,.... to improve timing of swallow initiation especially with solid.  Pt had no aspiration with nectar with chin tuck via straw, honey thick liquids or solids.  She does appear to have secretions retained in pharynx that mix with retention. Recommend she continue creamy puree/nectar thick liquids via straw with chin tuck posture. Single ice chips ok any time - with full supervision.  Head reclined to tsp of thin was not aspirated and may be feasible for between meals AFTER pt has her PEG placed and with SLP ONLY initially.   Pt fatigued during testing and said I don't want to do this anymore and this fatigue/frustration is likely contributing to her poor po intake in addition to her displeasure with current diet regimen.   Recommend she have follow up with SLP to aggressively treat her dysphagia after she receives her PEG for nutrition. Of note, denture in place did not improve pt's mastication ability. Factors that may increase risk of adverse event in presence of aspiration Noe & Lianne 2021): Factors that may increase risk of adverse event in presence of aspiration Noe & Lianne 2021): Frequent aspiration of large volumes; Aspiration of thick, dense, and/or acidic materials; Weak cough; Reduced cognitive function; Dependence for  feeding and/or oral hygiene Recommendations/Plan: Swallowing Evaluation Recommendations Swallowing Evaluation Recommendations Recommendations: PO diet PO Diet Recommendation: Dysphagia 1 (Pureed); Mildly thick liquids (Level 2, nectar thick) (family can bring pt soft snacks when visit for po) popsicles Liquid Administration via: Spoon; Straw- STRAW WITH NECTAR CHIN TUCK Medication Administration: Crushed with puree, START AND FOLLOW W/LIQUID Supervision: Staff to assist with self-feeding; Full supervision/cueing for swallowing strategies Swallowing strategies  : Slow rate; Small bites/sips; Chin tuck (COUGH and EXPECTORATE at completion of meal;snack) ORAL SUCTION AFTER MEALS; START ALL INTAKE w/LIQUIDS Postural changes: Position pt fully upright for meals; Stay upright 30-60 min after meals Oral care recommendations: Oral care before ice chips/water  Caregiver Recommendations: Remove water  pitcher; Have oral suction available Treatment Plan Treatment Plan Treatment recommendations: Therapy as outlined in treatment plan below Follow-up recommendations: Other (comment) (f/u at next venue) Functional status assessment: Patient has had a recent decline in their  functional status and demonstrates the ability to make significant improvements in function in a reasonable and predictable amount of time. Treatment frequency: Min 2x/week Treatment duration: 2 weeks Interventions: Aspiration precaution training; Respiratory muscle strength training; Compensatory techniques; Patient/family education; Diet toleration management by SLP; Trials of upgraded texture/liquids Recommendations Recommendations for follow up therapy are one component of a multi-disciplinary discharge planning process, led by the attending physician.  Recommendations may be updated based on patient status, additional functional criteria and insurance authorization. Assessment: Orofacial Exam: Orofacial Exam Oral Cavity: Oral Hygiene: Xerostomia Oral Cavity  - Dentition: Missing dentition; Poor condition Orofacial Anatomy: Other (comment) Oral Motor/Sensory Function: Suspected cranial nerve impairment CN V - Trigeminal: WFL; Not tested CN VII - Facial: Not tested CN IX - Glossopharyngeal, CN X - Vagus: Not tested CN XII - Hypoglossal: -- (reduced both right and left) Anatomy: Anatomy: Other (Comment) (has CorTrak in place) Boluses Administered: Boluses Administered Boluses Administered: Thin liquids (Level 0); Mildly thick liquids (Level 2, nectar thick); Moderately thick liquids (Level 3, honey thick); Puree; Solid  Oral Impairment Domain: Oral Impairment Domain Lip Closure: Interlabial escape, no progression to anterior lip Tongue control during bolus hold: Escape to lateral buccal cavity/floor of mouth Bolus preparation/mastication: Disorganized chewing/mashing with solid pieces of bolus unchewed Bolus transport/lingual motion: Slow tongue motion Oral residue: Trace residue lining oral structures Location of oral residue : Tongue Initiation of pharyngeal swallow : Pyriform sinuses; Posterior laryngeal surface of the epiglottis  Pharyngeal Impairment Domain: Pharyngeal Impairment Domain Soft palate elevation: No bolus between soft palate (SP)/pharyngeal wall (PW) Laryngeal elevation: Partial superior movement of thyroid  cartilage/partial approximation of arytenoids to epiglottic petiole Anterior hyoid excursion: Partial anterior movement Epiglottic movement: Partial inversion Laryngeal vestibule closure: Incomplete, narrow column air/contrast in laryngeal vestibule Pharyngeal stripping wave : Present - diminished Pharyngeal contraction (A/P view only): N/A (unable due to pt's size and positioning) Pharyngoesophageal segment opening: Partial distention/partial duration, partial obstruction of flow Tongue base retraction: Narrow column of contrast or air between tongue base and PPW (suspect her small bore tube negatively impacted her swallowing) Pharyngeal residue:  Collection of residue within or on pharyngeal structures Location of pharyngeal residue: Valleculae; Tongue base  Esophageal Impairment Domain: Esophageal Impairment Domain Esophageal clearance upright position: Esophageal retention Pill: Pill Puree: -- (DNT) Penetration/Aspiration Scale Score: Penetration/Aspiration Scale Score 1.  Material does not enter airway: Moderately thick liquids (Level 3, honey thick); Puree; Solid 7.  Material enters airway, passes BELOW cords and not ejected out despite cough attempt by patient: Thin liquids (Level 0); Mildly thick liquids (Level 2, nectar thick) Compensatory Strategies: Compensatory Strategies Compensatory strategies: Yes Effective Chin Tuck: Mildly thick liquid (Level 2, nectar thick) Left head turn: Ineffective Ineffective Left Head Turn: Puree; Mildly thick liquid (Level 2, nectar thick) (to decrease retention) Posterior head tilt: Effective Effective Posterior head tilt: Thin liquid (Level 0) (with tsp amount)   General Information: Caregiver present: No  Diet Prior to this Study: Dysphagia 1 (pureed); Moderately thick liquids (Level 3, honey thick)   Temperature : Normal   Respiratory Status: WFL (does become slightly dyspneic with po intake)   No data recorded  History of Recent Intubation: No  Behavior/Cognition: Alert; Cooperative; Other (Comment); Requires cueing Self-Feeding Abilities: Dependent for feeding Baseline vocal quality/speech: Hypophonia/low volume Volitional Cough: Able to elicit Volitional Swallow: Able to elicit Exam Limitations: Poor bolus acceptance (limited acceptance due to coughing episodes) Goal Planning: Prognosis for improved oropharyngeal function: Fair Barriers to Reach Goals: Motivation; Time post onset No  data recorded Patient/Family Stated Goal: I don't like honey Consulted and agree with results and recommendations: Patient; Nurse; Nurse Tech Pain: Pain Assessment Pain Assessment: Faces Pain Score: 6 Faces Pain Scale: 4 Breathing:  0 Negative Vocalization: 0 Facial Expression: 0 Body Language: 0 Consolability: 0 PAINAD Score: 0 Pain Location: throat x2 days Pain Descriptors / Indicators: Aching; Moaning; Grimacing Pain Intervention(s): Limited activity within patient's tolerance; Monitored during session; Ice applied; Patient requesting pain meds-RN notified End of Session: Start Time:SLP Start Time (ACUTE ONLY): 0845 Stop Time: SLP Stop Time (ACUTE ONLY): 0915 Time Calculation:SLP Time Calculation (min) (ACUTE ONLY): 30 min Charges: SLP Evaluations $ SLP Speech Visit: 1 Visit SLP Evaluations $MBS Swallow: 1 Procedure $Swallowing Treatment: 1 Procedure SLP visit diagnosis: SLP Visit Diagnosis: Dysphagia, oropharyngeal phase (R13.12) Past Medical History: Past Medical History: Diagnosis Date  Anemia   iron insufion on 05/2016   Atypical chest pain   Atypical CP--stress test, cor angio, and CT chest negative in 2005. 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  History of DVT (deep vein thrombosis)   on OCP  History of kidney stones   Hypertension   Mild anemia   WBC mild high, lab 2012/ HEMATOLOGY   Osteoarthritis   Osteoarthritis of the hip, knee, and hand, vicodin , prn  Osteopenia   BD in 2013  Tobacco use   quit 03/2008  Vitamin D deficiency  Past Surgical History: Past Surgical History: Procedure Laterality Date  APPENDECTOMY    BALLOON DILATION N/A 07/09/2016  Procedure: BALLOON DILATION;  Surgeon: Gladis MARLA Louder, MD;  Location: [facility];  Service: Endoscopy;  Laterality: N/A;  COLONOSCOPY WITH PROPOFOL  N/A 07/09/2016  Procedure: COLONOSCOPY WITH PROPOFOL ;  Surgeon: Gladis MARLA Louder, MD;  Location: [facility];  Service: Endoscopy;  Laterality: N/A;   ESOPHAGOGASTRODUODENOSCOPY N/A 07/09/2016  Procedure: ESOPHAGOGASTRODUODENOSCOPY (EGD);  Surgeon: Gladis MARLA Louder, MD;  Location: [facility];  Service: Endoscopy;  Laterality: N/A;  EYE SURGERY    left eye cataract srugery   HERNIA REPAIR    surgery fro endometriosis    TONSILLECTOMY   Madelin MARLA, MS [facility] S[facility] Acute Rehab Services Office [phone] [name] 02/19/2024, 10:06 AM  DG CHEST PORT 1 VIEW Result Date: 02/13/2024 CLINICAL DATA:  Chest pain EXAM: PORTABLE CHEST 1 VIEW COMPARISON:  02/11/2024 FINDINGS: Cardiomegaly. Right base atelectasis. Left lung clear. No effusions or edema. No acute bony abnormality. IMPRESSION: Cardiomegaly. Right base atelectasis. Electronically Signed   By: Franky Crease M.D.   On: 02/13/2024 17:41   CT ABDOMEN WO CONTRAST Result Date: 02/12/2024 CLINICAL DATA:  Gastrostomy status EXAM: CT ABDOMEN WITHOUT CONTRAST TECHNIQUE: Multidetector CT imaging of the abdomen was performed following the standard protocol without IV contrast. RADIATION DOSE REDUCTION: This exam was performed according to the departmental dose-optimization program which includes automated exposure control, adjustment of the mA and/or kV according to patient size and/or use of iterative reconstruction technique. COMPARISON:  CT 05/07/2016 FINDINGS: Lower chest: Bibasilar atelectasis.  No acute abnormality. Hepatobiliary: No acute abnormality. Pancreas: Unremarkable. Spleen: Unremarkable. Adrenals/Urinary Tract: Left adrenal nodule is unchanged since 2017 compatible with benign adenoma. No follow-up recommended. Normal right adrenal gland. Right nonobstructing nephrolithiasis versus vascular calcifications. No  hydronephrosis. Stomach/Bowel: Enteric tube tip in the gastric antrum. The visualized small bowel and colon are unremarkable. No bowel interposed between the greater curvature of the stomach and the anterior abdominal wall. Vascular/Lymphatic: Aortic atherosclerotic calcification. No  lymphadenopathy. Other: No free intraperitoneal fluid or air is visualized. Musculoskeletal: No acute fracture. IMPRESSION: No acute abnormality. No bowel interposed between the greater curvature of the stomach and the anterior abdominal wall. Feeding tube tip in the stomach. Electronically Signed   By: Norman Gatlin M.D.   On: 02/12/2024 21:34   DG Chest 2 View Result Date: 02/11/2024 CLINICAL DATA:  Hypoxemia. EXAM: CHEST - 2 VIEW COMPARISON:  Radiograph 02/08/2024 FINDINGS: The heart is borderline enlarged. Mediastinal contours are stable. Improvement in interstitial opacities from prior exam. No developing airspace disease. Minor subsegmental atelectasis at the lung bases. No pleural effusion. No pneumothorax. IMPRESSION: Improvement in interstitial opacities from prior exam. No developing airspace disease. Electronically Signed   By: Andrea Gasman M.D.   On: 02/11/2024 14:43   DG Chest 2 View Result Date: 02/08/2024 CLINICAL DATA:  Hypoxia EXAM: CHEST - 2 VIEW COMPARISON:  02/02/2024 FINDINGS: Mild bilateral interstitial edema or infiltrates may be marginally increased. Heart size upper limits normal. Aortic Atherosclerosis (ICD10-170.0). No definite effusion. Vertebral endplate spurring at multiple levels in the mid and lower thoracic spine. IMPRESSION: Mild bilateral interstitial edema or infiltrates. Electronically Signed   By: JONETTA Faes M.D.   On: 02/08/2024 09:44   MR BRAIN WO CONTRAST Result Date: 02/06/2024 CLINICAL DATA:  Follow-up examination for stroke. EXAM: MRI HEAD WITHOUT CONTRAST TECHNIQUE: Multiplanar, multiecho pulse sequences of the brain and surrounding structures were obtained without intravenous contrast. COMPARISON:  Comparison made with prior CTs from 02/05/2024. FINDINGS: Brain: Cerebral volume within normal limits. Patchy T2/FLAIR hyperintensity involving the periventricular deep white matter both cerebral hemispheres, consistent chronic small vessel ischemic disease,  moderate in nature. Small remote lacunar infarct present at the right frontal corona radiata. Focus of restricted diffusion measuring 1.7 x 0.8 x 1.1 cm seen involving the right pons, consistent with an acute ischemic infarct (series 5, image 68). No associated hemorrhage or mass effect. No other evidence for acute or subacute ischemia. Gray-white matter differentiation otherwise maintained. No areas of chronic cortical infarction. No acute or significant chronic intracranial blood products. No mass lesion, midline shift or mass effect. No hydrocephalus or extra-axial fluid collection. Pituitary gland within normal limits. Vascular: Major intracranial vascular flow voids are maintained. Skull and upper cervical spine: Craniocervical junction within normal limits. Bone marrow signal intensity overall within normal limits. No scalp soft tissue abnormality. Sinuses/Orbits: Prior bilateral ocular lens replacement. Paranasal sinuses are largely clear. Trace right mastoid effusion noted, of doubtful significance. Other: None. IMPRESSION: 1. 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 2. Underlying moderate chronic microvascular ischemic disease with small remote lacunar infarct at the right frontal corona radiata. Electronically Signed   By: Morene Hoard M.D.   On: 02/06/2024 04:55   DG Swallowing Func-Speech Pathology Result Date: 02/05/2024 Table formatting from the original result was not included. Modified Barium Swallow Study Patient Details Name: CHERE BABSON MRN: [id number] Date of Birth: 03-03-1941 Today's Date: 02/05/2024 HPI/PMH: HPI: Patient is an 83 y.o. female with PMH: CAD, COPD, essential HTN, CKD stage III, morbid obesity. She presented to the Ed on 02/03/24 with multiple complaints inclucding generalized weakeness, HA, poor appetite, chest congestion, blurry vision and family reporting change in her speech. ED evaluation found her to be hemodynamically stable but with  BP up to  183/86, UA showed evidence of UTI. CT head negative for acute intracranial abnormality or hemorrhage but did show moderate periventricular WM changes likely reflecting the sequela of small vessel ischemia. CXR showed diffuse interstitial opacities, R>L. While in ED, patient became very restless, anxious and fidgety. Despite multiple doses of Ativan  to attempt MRI,, it was canceled due to patient not cooperating. Non-violent restraints placed. SLP swallow evalauation ordered secondary to family reports of swallowing difficulities as well as RN observing patient to not be able to swallow even smaller pills, and coughing with liquids.  MBSS 6/25 with recs for D3/NTL. Pt with code stroke overnight and reassessed 6/26 with change in swallow function. Clinical Impression: Pt has had a change in oral and pharyngeal function since initial MBS on previous date. She has slower lingual transit and mastication, with less lingual control and more oral residue, although residue is mild. She has difficulty getting thickened liquids up via straw. New pharyngeal impairments also include reduced base of tongue retraction and pharyngeal squeeze. Laryngeal vestibule closure and epiglottic inversion are incomplete, although thicker boluses help invert the epiglottis a little more. These pharyngeal deficits, combined with more sluggish oral phase, result in boluses dumping into the larynx and pharynx before the swallow, with aspiration of thin and nectar thick liquids that is sensed but not cleared (PAS 7). This includes gross aspiration from one small cup sip of nectar thick liquids, that elicited persistent coughing from the pt throughout the remaining duration of the study. Pt had no aspiration with honey thick liquids or solids, but given this persistent coughing, it was hard to challenge her to take larger volumes at a time. Recommend that she resume a PO diet with assistance during intake, starting with Dys 2 (finely chopped) diet  and honey thick liquids by cup. DIGEST Swallow Severity Rating*  Safety: 4  Efficiency: 1  Overall Pharyngeal Swallow Severity: 3 1: mild; 2: moderate; 3: severe; 4: profound *The Dynamic Imaging Grade of Swallowing Toxicity is standardized for the head and neck cancer population, however, demonstrates promising clinical applications across populations to standardize the clinical rating of pharyngeal swallow safety and severity. Factors that may increase risk of adverse event in presence of aspiration Noe & Lianne 2021): Factors that may increase risk of adverse event in presence of aspiration Noe & Lianne 2021): Frequent aspiration of large volumes; Aspiration of thick, dense, and/or acidic materials; Weak cough; Reduced cognitive function; Dependence for feeding and/or oral hygiene Recommendations/Plan: Swallowing Evaluation Recommendations Swallowing Evaluation Recommendations Recommendations: PO diet PO Diet Recommendation: Dysphagia 2 (Finely chopped); Moderately thick liquids (Level 3, honey thick) Liquid Administration via: Cup; Spoon Medication Administration: Crushed with puree Supervision: Staff to assist with self-feeding; Full supervision/cueing for swallowing strategies Swallowing strategies  : Slow rate; Small bites/sips Postural changes: Position pt fully upright for meals; Stay upright 30-60 min after meals Oral care recommendations: Oral care BID (2x/day) Caregiver Recommendations: Avoid jello, ice cream, thin soups, popsicles; Remove water  pitcher; Have oral suction available Treatment Plan Treatment Plan Treatment recommendations: Therapy as outlined in treatment plan below Follow-up recommendations: Home health SLP Functional status assessment: Patient has had a recent decline in their functional status and demonstrates the ability to make significant improvements in function in a reasonable and predictable amount of time. Treatment frequency: Min 2x/week Treatment duration: 2 weeks  Interventions: Aspiration precaution training; Patient/family education; Trials of upgraded texture/liquids; Diet toleration management by SLP; Oropharyngeal exercises Recommendations Recommendations for follow up therapy are one component  of a multi-disciplinary discharge planning process, led by the attending physician.  Recommendations may be updated based on patient status, additional functional criteria and insurance authorization. Assessment: Orofacial Exam: Orofacial Exam Oral Cavity: Oral Hygiene: WFL Oral Cavity - Dentition: Dentures, top; Missing dentition; Poor condition (lower arch) Orofacial Anatomy: WFL Oral Motor/Sensory Function: WFL Anatomy: Anatomy: Suspected cervical osteophytes Boluses Administered: Boluses Administered Boluses Administered: Thin liquids (Level 0); Mildly thick liquids (Level 2, nectar thick); Moderately thick liquids (Level 3, honey thick); Puree; Solid  Oral Impairment Domain: Oral Impairment Domain Lip Closure: Interlabial escape, no progression to anterior lip Tongue control during bolus hold: Escape to lateral buccal cavity/floor of mouth Bolus preparation/mastication: Slow prolonged chewing/mashing with complete recollection Bolus transport/lingual motion: Slow tongue motion Oral residue: Residue collection on oral structures Location of oral residue : Floor of mouth; Tongue; Palate Initiation of pharyngeal swallow : Pyriform sinuses  Pharyngeal Impairment Domain: Pharyngeal Impairment Domain Soft palate elevation: No bolus between soft palate (SP)/pharyngeal wall (PW) Laryngeal elevation: Complete superior movement of thyroid  cartilage with complete approximation of arytenoids to epiglottic petiole Anterior hyoid excursion: Partial anterior movement Epiglottic movement: Partial inversion Laryngeal vestibule closure: Incomplete, narrow column air/contrast in laryngeal vestibule Pharyngeal stripping wave : Present - diminished Pharyngeal contraction (A/P view only): N/A  Pharyngoesophageal segment opening: Partial distention/partial duration, partial obstruction of flow Tongue base retraction: Narrow column of contrast or air between tongue base and PPW Pharyngeal residue: Collection of residue within or on pharyngeal structures Location of pharyngeal residue: Valleculae; Tongue base  Esophageal Impairment Domain: Esophageal Impairment Domain Esophageal clearance upright position: Esophageal retention Pill: Pill Consistency administered: Puree Puree: WFL Penetration/Aspiration Scale Score: Penetration/Aspiration Scale Score 1.  Material does not enter airway: Moderately thick liquids (Level 3, honey thick); Puree; Solid 3.  Material enters airway, remains ABOVE vocal cords and not ejected out: Mildly thick liquids (Level 2, nectar thick) 5.  Material enters airway, CONTACTS cords and not ejected out: Mildly thick liquids (Level 2, nectar thick) 7.  Material enters airway, passes BELOW cords and not ejected out despite cough attempt by patient: Thin liquids (Level 0); Mildly thick liquids (Level 2, nectar thick) Compensatory Strategies: Compensatory Strategies Compensatory strategies: No Effortful swallow: Ineffective Ineffective Effortful Swallow: Thin liquid (Level 0) Chin tuck: Effective Effective Chin Tuck: Thin liquid (Level 0) Ineffective Chin Tuck: Thin liquid (Level 0)   General Information: Caregiver present: Yes Banker)  Diet Prior to this Study: NPO   Temperature : Normal   Respiratory Status: WFL   Supplemental O2: Nasal cannula   History of Recent Intubation: No  Behavior/Cognition: Alert; Requires cueing; Distractible Self-Feeding Abilities: Needs assist with self-feeding Baseline vocal quality/speech: Dysphonic Volitional Cough: Able to elicit Volitional Swallow: Able to elicit Exam Limitations: Poor bolus acceptance (limited acceptance due to coughing episodes) Goal Planning: Prognosis for improved oropharyngeal function: Fair Barriers to Reach Goals: Time post onset;  Cognitive deficits No data recorded Patient/Family Stated Goal: not stated; RN reports requests for food and drink Consulted and agree with results and recommendations: Patient; Nurse Pain: Pain Assessment Pain Assessment: Faces Pain Score: 0 Faces Pain Scale: 0 Facial Expression: 0 Body Movements: 0 Muscle Tension: 0 Compliance with ventilator (intubated pts.): N/A Vocalization (extubated pts.): 0 CPOT Total: 0 End of Session: Start Time:SLP Start Time (ACUTE ONLY): 1453 Stop Time: SLP Stop Time (ACUTE ONLY): 1519 Time Calculation:SLP Time Calculation (min) (ACUTE ONLY): 26 min Charges: SLP Evaluations $ SLP Speech Visit: 1 Visit SLP Evaluations $BSS Swallow: 1 Procedure $MBS Swallow: 1  Procedure $ SLP EVAL LANGUAGE/SOUND PRODUCTION: 1 Procedure SLP visit diagnosis: SLP Visit Diagnosis: Dysphagia, oropharyngeal phase (R13.12) Past Medical History: Past Medical History: Diagnosis Date  Anemia   iron insufion on 05/2016   Atypical chest pain   Atypical CP--stress test, cor angio, and CT chest negative in 2005. 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  History of DVT (deep vein thrombosis)   on OCP  History of kidney stones   Hypertension   Mild anemia   WBC mild high, lab 2012/ HEMATOLOGY   Osteoarthritis   Osteoarthritis of the hip, knee, and hand, vicodin , prn  Osteopenia   BD in 2013  Tobacco use   quit 03/2008  Vitamin D deficiency  Past Surgical History: Past Surgical History: Procedure Laterality Date  APPENDECTOMY    BALLOON DILATION N/A 07/09/2016  Procedure: BALLOON DILATION;  Surgeon: Gladis MARLA Louder, MD;  Location: [facility];  Service: Endoscopy;  Laterality: N/A;  COLONOSCOPY WITH PROPOFOL  N/A 07/09/2016  Procedure: COLONOSCOPY  WITH PROPOFOL ;  Surgeon: Gladis MARLA Louder, MD;  Location: [facility];  Service: Endoscopy;  Laterality: N/A;  ESOPHAGOGASTRODUODENOSCOPY N/A 07/09/2016  Procedure: ESOPHAGOGASTRODUODENOSCOPY (EGD);  Surgeon: Gladis MARLA Louder, MD;  Location: [facility];  Service: Endoscopy;  Laterality: N/A;  EYE SURGERY    left eye cataract srugery   HERNIA REPAIR    surgery fro endometriosis    TONSILLECTOMY   Leita SAILOR., M.A. CCC-SLP Acute Rehabilitation Services Office: [phone] Secure chat preferred 02/05/2024, 3:59 PM  ECHOCARDIOGRAM COMPLETE Result Date: 02/05/2024    ECHOCARDIOGRAM REPORT   Patient Name:   LAPORCHE MARTELLE Gilles Date of Exam: 02/05/2024 Medical Rec #:  [id number]        Height:       62.0 in Accession #:    [id number]       Weight:       197.5 lb Date of Birth:  [birth year]        BSA:          1.902 m Patient Age:    83 years         BP:           145/62 mmHg Patient Gender: F                HR:           88 bpm. Exam Location:  Inpatient Procedure: 2D Echo, Intracardiac Opacification Agent, Cardiac Doppler and Color            Doppler (Both Spectral and Color Flow Doppler were utilized during            procedure). Indications:    stroke  History:        Patient has prior history of Echocardiogram examinations, most                 recent 09/14/2020.  Sonographer:    Therisa Crouch Referring Phys: [id number] SRISHTI L BHAGAT IMPRESSIONS  1. Left ventricular ejection fraction, by estimation, is 55 to 60%. The left ventricle has normal function. The left ventricle has no regional wall motion abnormalities. Left ventricular diastolic parameters are consistent with Grade I diastolic dysfunction (impaired relaxation).  2.  Right ventricular systolic function is normal. The right ventricular size is normal.  3. The mitral valve is normal in structure. No evidence of mitral valve regurgitation. No evidence of mitral stenosis.  4. The aortic valve is normal in structure. Aortic valve regurgitation is not visualized. No  aortic stenosis is present.  5. The inferior vena cava is normal in size with greater than 50% respiratory variability, suggesting right atrial pressure of 3 mmHg. FINDINGS  Left Ventricle: Left ventricular ejection fraction, by estimation, is 55 to 60%. The left ventricle has normal function. The left ventricle has no regional wall motion abnormalities. The left ventricular internal cavity size was normal in size. There is  no left ventricular hypertrophy. Left ventricular diastolic parameters are consistent with Grade I diastolic dysfunction (impaired relaxation). Right Ventricle: The right ventricular size is normal. No increase in right ventricular wall thickness. Right ventricular systolic function is normal. Left Atrium: Left atrial size was normal in size. Right Atrium: Right atrial size was normal in size. Pericardium: There is no evidence of pericardial effusion. Mitral Valve: The mitral valve is normal in structure. No evidence of mitral valve regurgitation. No evidence of mitral valve stenosis. Tricuspid Valve: The tricuspid valve is normal in structure. Tricuspid valve regurgitation is not demonstrated. No evidence of tricuspid stenosis. Aortic Valve: The aortic valve is normal in structure. Aortic valve regurgitation is not visualized. No aortic stenosis is present. Pulmonic Valve: The pulmonic valve was normal in structure. Pulmonic valve regurgitation is not visualized. No evidence of pulmonic stenosis. Aorta: The aortic root is normal in size and structure. Venous: The inferior vena cava is normal in size with greater than 50% respiratory variability, suggesting right atrial pressure of 3 mmHg. IAS/Shunts: No atrial level shunt detected by color flow Doppler.   Diastology LV e' medial:    4.79 cm/s LV E/e' medial:  17.2 LV e' lateral:   7.18 cm/s LV E/e' lateral: 11.5  RIGHT VENTRICLE             IVC RV Basal diam:  2.80 cm     IVC diam: 1.90 cm RV S prime:     14.90 cm/s TAPSE (M-mode): 1.8 cm LEFT  ATRIUM             Index LA Vol (A2C):   51.1 ml 26.87 ml/m LA Vol (A4C):   47.8 ml 25.14 ml/m LA Biplane Vol: 52.3 ml 27.50 ml/m  MITRAL VALVE MV Area (PHT): 5.38 cm MV Decel Time: 141 msec MV E velocity: 82.30 cm/s MV A velocity: 108.00 cm/s MV E/A ratio:  0.76 Aditya Sabharwal Electronically signed by Ria Commander Signature Date/Time: 02/05/2024/11:47:44 AM    Final    CT ANGIO HEAD NECK W WO CM (CODE STROKE) Result Date: 02/05/2024 CLINICAL DATA:  Initial evaluation for acute neuro deficit, stroke suspected. EXAM: CT ANGIOGRAPHY HEAD AND NECK WITH AND WITHOUT CONTRAST TECHNIQUE: Multidetector CT imaging of the head and neck was performed using the standard protocol during bolus administration of intravenous contrast. Multiplanar CT image reconstructions and MIPs were obtained to evaluate the vascular anatomy. Carotid stenosis measurements (when applicable) are obtained utilizing NASCET criteria, using the distal internal carotid diameter as the denominator. RADIATION DOSE REDUCTION: This exam was performed according to the departmental dose-optimization program which includes automated exposure control, adjustment of the mA and/or kV according to patient size and/or use of iterative reconstruction technique. CONTRAST:  75mL OMNIPAQUE  IOHEXOL  350 MG/ML SOLN COMPARISON:  CT from earlier the same day. FINDINGS: For  caliber standard branch pattern. Aortic atherosclerosis. No significant stenosis about the origin the great vessels. CTA NECK FINDINGS Aortic arch: Visualized aortic arch within normal limits Right carotid system: Right common and internal carotid arteries are tortuous but patent without dissection. Atheromatous change about the right carotid bulb without hemodynamically significant greater than 50% stenosis. Left carotid system: Left common and internal carotid arteries are tortuous but patent without dissection. Atheromatous change about the left carotid bulb without hemodynamically  significant greater than 50% stenosis. Vertebral arteries: Both vertebral arteries arise from the subclavian arteries. Atheromatous change at the origin of the right vertebral artery with moderate stenosis. Vertebral arteries are tortuous but otherwise patent. No other stenosis or dissection. Skeleton: Few small sclerotic foci in noted within the cervical spine, favored to reflect small benign bone islands. No worrisome osseous lesions. Moderate to advanced cervical spondylosis. Patient is largely edentulous. Other neck: No other acute finding. Few small right thyroid  nodules measuring up to 9 mm noted, of doubtful significance given size and patient age, no follow-up imaging recommended. Upper chest: No other acute finding. Review of the MIP images confirms the above findings CTA HEAD FINDINGS Anterior circulation: Atheromatous change about the carotid siphons without hemodynamically significant stenosis. A1 segments patent bilaterally. Normal anterior communicating artery complex. Both ACAs patent without stenosis. No M1 stenosis or occlusion. Distal MCA branches perfused and symmetric. Posterior circulation: Both V4 segments patent without significant stenosis. Both PICA patent. Basilar patent without stenosis. Superior cerebellar arteries patent bilaterally. Both PCAs primarily supplied via the basilar. Focal severe proximal right P2 stenosis (series 15, image 18). PCAs otherwise widely patent to their distal aspects. Venous sinuses: Patent allowing for timing the contrast bolus. Anatomic variants: As above.  No aneurysm. Review of the MIP images confirms the above findings IMPRESSION: 1. Negative CTA for large vessel occlusion or other emergent finding. 2. Focal severe proximal right P2 stenosis. 3. Moderate stenosis at the origin of the right vertebral artery. 4. Atheromatous change about the carotid bifurcations and carotid siphons without hemodynamically significant stenosis. 5. Diffuse tortuosity of the  major arterial vasculature of the head and neck, suggesting chronic underlying hypertension. 6.  Aortic Atherosclerosis (ICD10-I70.0). These results were communicated to Dr. Jerrie at 2:47 am on 02/05/2024 by text page via the [facility] messaging system. Electronically Signed   By: Morene Hoard M.D.   On: 02/05/2024 02:49   CT HEAD CODE STROKE WO CONTRAST Result Date: 02/05/2024 CLINICAL DATA:  Code stroke. Initial evaluation for acute neuro deficit, stroke suspected. EXAM: CT HEAD WITHOUT CONTRAST TECHNIQUE: Contiguous axial images were obtained from the base of the skull through the vertex without intravenous contrast. RADIATION DOSE REDUCTION: This exam was performed according to the departmental dose-optimization program which includes automated exposure control, adjustment of the mA and/or kV according to patient size and/or use of iterative reconstruction technique. COMPARISON:  Head CT from 02/02/2024 FINDINGS: Brain: Cerebral volume within normal limits. Patchy hypodensity involving the supratentorial cerebral white matter, consistent with chronic small vessel ischemic disease, moderately advanced in nature. Small remote lacunar infarct present at the right frontal corona radiata. Overall, appearance is stable from prior. No acute intracranial hemorrhage. No acute large vessel territory infarct. No mass lesion or midline shift. No hydrocephalus or extra-axial fluid collection. Vascular: No abnormal hyperdense vessel. Calcified atherosclerosis present at the skull base. Skull: Scalp soft tissues within normal limits.  Calvarium intact. Sinuses/Orbits: Globes orbital soft tissues within normal limits. Paranasal sinuses are clear. Small chronic appearing right mastoid effusion, stable.  Left mastoid air cells are clear. Other: None. ASPECTS [facility] Stroke Program Early CT Score) - Ganglionic level infarction (caudate, lentiform nuclei, internal capsule, insula, M1-M3 cortex): 7 - Supraganglionic  infarction (M4-M6 cortex): 3 Total score (0-10 with 10 being normal): 10 IMPRESSION: 1. Stable head CT.  No acute intracranial abnormality. 2. ASPECTS is 10. 3. Moderate chronic microvascular ischemic disease, with small remote lacunar infarct at the right frontal corona radiata. Results communicated by telephone at the time of interpretation on 02/05/2024 at 2:16 am to provider Dr. Jerrie. Electronically Signed   By: Morene Hoard M.D.   On: 02/05/2024 02:18   DG Swallowing Func-Speech Pathology Result Date: 02/04/2024 Table formatting from the original result was not included. Modified Barium Swallow Study Patient Details Name: RAIANA PHARRIS MRN: [id number] Date of Birth: [birth year] Today's Date: 02/04/2024 HPI/PMH: HPI: Patient is an 83 y.o. female with PMH: CAD, COPD, essential HTN, CKD stage III, morbid obesity. She presented to the Ed on 02/03/24 with multiple complaints inclucding generalized weakeness, HA, poor appetite, chest congestion, blurry vision and family reporting change in her speech. ED evaluation found her to be hemodynamically stable but with BP up to 183/86, UA showed evidence of UTI. CT head negative for acute intracranial abnormality or hemorrhage but did show moderate periventricular WM changes likely reflecting the sequela of small vessel ischemia. CXR showed diffuse interstitial opacities, R>L. While in ED, patient became very restless, anxious and fidgety. Despite multiple doses of Ativan  to attempt MRI,, it was canceled due to patient not cooperating. Non-violent restraints placed. SLP swallow evalauation ordered secondary to family reports of swallowing difficulities as well as RN observing patient to not be able to swallow even smaller pills, and coughing with liquids. 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 Swallow is initiated  at level of pyriform sinus with thin and nectar thick liquids but incidents of thin liquids entering laryngeal vestibule before swallow initiated were also observed. Anterior hyoid excursion was partial in completion, and although epiglottic inversion was complete, laryngeal vestibule closure was incomplete and delayed. This led to thin liquid aspiration which was sensed every time (PAS 8) with 1/4 spoon size, 1/2 spoon size and small controlled cup size sips. Chin tuck posture with small sips of thin liquids was effective to protect airway and prevent aspiration but small straw sip in chin tuck position was not effective to prevent aspiration. Nectar thick liquids from various sizes of cup sips were tolerated without aspiration but with penetration above the vocal cords (PAS 3). Mastication of solids was mildly prolonged and expected to be primarily due to patient's poor condition of dentiton. Masticated solids bolus and puree solids boluses both transited pharyngeally without delay or difficulty. Esophageal sweep revealed timely transit of barium tablet but with some barium in distal portion of esophagus. SLP is recommending Dys 3 (mechanical soft) solids and nectar thick liquids. SLP will plan to follow for toleration, as well as education and training of patient to consistently perform chin tuck with small sips of thin liquids. Factors that may increase risk of adverse event in presence of aspiration Noe & Lianne 2021): Factors that may increase risk of adverse event in presence of aspiration Noe & Lianne 2021): Frequent aspiration of large volumes Recommendations/Plan: Swallowing Evaluation Recommendations Swallowing Evaluation Recommendations Recommendations: PO diet PO Diet Recommendation: Dysphagia 3 (Mechanical soft); Mildly thick liquids (Level 2, nectar thick) Liquid Administration via:  Cup Medication Administration: Whole meds with puree Supervision: Patient able to self-feed; Intermittent  supervision/cueing for swallowing strategies Swallowing strategies  : Slow rate; Small bites/sips Postural changes: Position pt fully upright for meals; Stay upright 30-60 min after meals Oral care recommendations: Oral care BID (2x/day) Treatment Plan Treatment Plan Treatment recommendations: Therapy as outlined in treatment plan below Follow-up recommendations: Home health SLP Functional status assessment: Patient has had a recent decline in their functional status and demonstrates the ability to make significant improvements in function in a reasonable and predictable amount of time. Treatment frequency: Min 2x/week Treatment duration: 1 week Interventions: Aspiration precaution training; Diet toleration management by SLP; Compensatory techniques; Patient/family education; Trials of upgraded texture/liquids Recommendations Recommendations for follow up therapy are one component of a multi-disciplinary discharge planning process, led by the attending physician.  Recommendations may be updated based on patient status, additional functional criteria and insurance authorization. Assessment: Orofacial Exam: Orofacial Exam Oral Cavity: Oral Hygiene: WFL Oral Cavity - Dentition: Poor condition Orofacial Anatomy: WFL Oral Motor/Sensory Function: WFL Anatomy: Anatomy: Suspected cervical osteophytes Boluses Administered: Boluses Administered Boluses Administered: Thin liquids (Level 0); Moderately thick liquids (Level 3, honey thick); Mildly thick liquids (Level 2, nectar thick); Puree; Solid  Oral Impairment Domain: Oral Impairment Domain Lip Closure: No labial escape Tongue control during bolus hold: Cohesive bolus between tongue to palatal seal Bolus preparation/mastication: Timely and efficient chewing and mashing Bolus transport/lingual motion: Brisk tongue motion Oral residue: Complete oral clearance Location of oral residue : N/A Initiation of pharyngeal swallow : Pyriform sinuses; Posterior laryngeal surface of the  epiglottis  Pharyngeal Impairment Domain: Pharyngeal Impairment Domain Soft palate elevation: No bolus between soft palate (SP)/pharyngeal wall (PW) Laryngeal elevation: Complete superior movement of thyroid  cartilage with complete approximation of arytenoids to epiglottic petiole Anterior hyoid excursion: Partial anterior movement Epiglottic movement: Complete inversion Laryngeal vestibule closure: Incomplete, narrow column air/contrast in laryngeal vestibule Pharyngeal stripping wave : Present - complete Pharyngeal contraction (A/P view only): N/A Pharyngoesophageal segment opening: Complete distension and complete duration, no obstruction of flow Tongue base retraction: No contrast between tongue base and posterior pharyngeal wall (PPW) Pharyngeal residue: Collection of residue within or on pharyngeal structures Location of pharyngeal residue: Tongue base  Esophageal Impairment Domain: Esophageal Impairment Domain Esophageal clearance upright position: Esophageal retention Pill: Pill Consistency administered: Puree Puree: WFL Penetration/Aspiration Scale Score: Penetration/Aspiration Scale Score 1.  Material does not enter airway: Moderately thick liquids (Level 3, honey thick); Puree; Solid 3.  Material enters airway, remains ABOVE vocal cords and not ejected out: Mildly thick liquids (Level 2, nectar thick) 5.  Material enters airway, CONTACTS cords and not ejected out: Mildly thick liquids (Level 2, nectar thick) 7.  Material enters airway, passes BELOW cords and not ejected out despite cough attempt by patient: Thin liquids (Level 0) Compensatory Strategies: Compensatory Strategies Compensatory strategies: Yes Effortful swallow: Ineffective Ineffective Effortful Swallow: Thin liquid (Level 0) Chin tuck: Effective Effective Chin Tuck: Thin liquid (Level 0) Ineffective Chin Tuck: Thin liquid (Level 0)   General Information: No data recorded Diet Prior to this Study: Regular; Thin liquids (Level 0)   Temperature  : Normal   Respiratory Status: WFL   Supplemental O2: Nasal cannula   History of Recent Intubation: No  Behavior/Cognition: Alert; Cooperative; Pleasant mood Self-Feeding Abilities: Able to self-feed Baseline vocal quality/speech: Dysphonic Volitional Cough: Able to elicit Volitional Swallow: Able to elicit Exam Limitations: No limitations Goal Planning: Prognosis for improved oropharyngeal function: Good Barriers to Reach Goals: Time post  onset No data recorded Patient/Family Stated Goal: no family present,, patient calling out for nurse to take restraints off Consulted and agree with results and recommendations: Patient Pain: Pain Assessment Pain Assessment: No/denies pain Pain Score: 0 Pain Location: crying but doesnt seem to be pain but frustration End of Session: Start Time:SLP Start Time (ACUTE ONLY): 1017 Stop Time: SLP Stop Time (ACUTE ONLY): 1035 Time Calculation:SLP Time Calculation (min) (ACUTE ONLY): 18 min Charges: SLP Evaluations $ SLP Speech Visit: 1 Visit SLP Evaluations $BSS Swallow: 1 Procedure $MBS Swallow: 1 Procedure SLP visit diagnosis: SLP Visit Diagnosis: Dysphagia, oropharyngeal phase (R13.12) Past Medical History: Past Medical History: Diagnosis Date  Anemia   iron insufion on 05/2016   Atypical chest pain   Atypical CP--stress test, cor angio, and CT chest negative in 2005. 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  History of DVT (deep vein thrombosis)   on OCP  History of kidney stones   Hypertension   Mild anemia   WBC mild high, lab 2012/ HEMATOLOGY   Osteoarthritis   Osteoarthritis of the hip, knee, and hand, vicodin , prn  Osteopenia   BD in 2013  Tobacco use   quit 03/2008  Vitamin D deficiency  Past Surgical  History: Past Surgical History: Procedure Laterality Date  APPENDECTOMY    BALLOON DILATION N/A 07/09/2016  Procedure: BALLOON DILATION;  Surgeon: Gladis MARLA Louder, MD;  Location: [facility];  Service: Endoscopy;  Laterality: N/A;  COLONOSCOPY WITH PROPOFOL  N/A 07/09/2016  Procedure: COLONOSCOPY WITH PROPOFOL ;  Surgeon: Gladis MARLA Louder, MD;  Location: [facility];  Service: Endoscopy;  Laterality: N/A;  ESOPHAGOGASTRODUODENOSCOPY N/A 07/09/2016  Procedure: ESOPHAGOGASTRODUODENOSCOPY (EGD);  Surgeon: Gladis MARLA Louder, MD;  Location: [facility];  Service: Endoscopy;  Laterality: N/A;  EYE SURGERY    left eye cataract srugery   HERNIA REPAIR    surgery fro endometriosis    TONSILLECTOMY   Norleen IVAR Blase, MA, CCC-SLP Speech Therapy   CT Head Wo Contrast Result Date: 02/02/2024 CLINICAL DATA:  Sinusitis, acute, orbital or intracranial complications suspected headache, paresthesias EXAM: CT HEAD WITHOUT CONTRAST TECHNIQUE: Contiguous axial images were obtained from the base of the skull through the vertex without intravenous contrast. RADIATION DOSE REDUCTION: This exam was performed according to the departmental dose-optimization program which includes automated exposure control, adjustment of the mA and/or kV according to patient size and/or use of iterative reconstruction technique. COMPARISON:  None Available. FINDINGS: Brain: Normal anatomic configuration. Parenchymal volume loss is commensurate with the patient's age. Moderate periventricular white matter changes are present likely reflecting the sequela of small vessel ischemia. No abnormal intra or extra-axial mass lesion or fluid collection. No abnormal mass effect or midline shift. No evidence of acute intracranial hemorrhage or infarct. Ventricular size is normal. Cerebellum unremarkable. Vascular: No asymmetric hyperdense vasculature at the skull base. Skull: Intact Sinuses/Orbits: Paranasal sinuses are clear. Orbits are unremarkable. Other: Fluid  opacification of several right mastoid air cells without associated osseous erosion. Left mastoid air cells and middle ear cavities are clear. IMPRESSION: 1. No evidence of acute intracranial hemorrhage or infarct. 2. Moderate periventricular white matter changes likely reflecting the sequela of small vessel ischemia. 3. Right mastoid effusion. 4.  Paranasal sinuses are clear. Electronically Signed   By: Dorethia Molt M.D.   On: 02/02/2024 21:01   DG Chest 2 View Result Date: 02/02/2024 CLINICAL DATA:  Chest pain and shortness of breath EXAM: CHEST - 2 VIEW COMPARISON:  Chest radiograph dated 10/03/2020 FINDINGS: Patient is rotated to the right. Low lung volumes with bronchovascular crowding. Increased diffuse interstitial opacities, right-greater-than-left. No pleural effusion or pneumothorax. Enlarged cardiomediastinal silhouette. No acute osseous abnormality. IMPRESSION: 1. Increased diffuse interstitial opacities, right-greater-than-left, which may represent pulmonary edema or atypical infection. 2. Cardiomegaly. Electronically Signed   By: Limin  Xu M.D.   On: 02/02/2024 16:45    Microbiology: Results for orders placed or performed during the hospital encounter of 02/02/24  Urine Culture     Status: Abnormal   Collection Time: 02/02/24  9:11 PM   Specimen: Urine, Random  Result Value Ref Range Status   Specimen Description URINE, RANDOM  Final   Special Requests   Final    NONE Reflexed from [id number] Performed at [facility], [address] [place], [name] [address]    Culture MULTIPLE SPECIES PRESENT, SUGGEST RECOLLECTION (A)  Final   Report Status 02/03/2024 FINAL  Final  Respiratory (~20 pathogens) panel by PCR     Status: None   Collection Time: 02/03/24  1:37 AM   Specimen: Nasopharyngeal Swab; Respiratory  Result Value Ref Range Status   Adenovirus NOT DETECTED NOT DETECTED Final   Coronavirus 229E NOT DETECTED NOT DETECTED Final    Comment: (NOTE) The Coronavirus on the  Respiratory Panel, DOES NOT test for the novel  Coronavirus (2019 nCoV)    Coronavirus HKU1 NOT DETECTED NOT DETECTED Final   Coronavirus NL63 NOT DETECTED NOT DETECTED Final   Coronavirus OC43 NOT DETECTED NOT DETECTED Final   Metapneumovirus NOT DETECTED NOT DETECTED Final   Rhinovirus / Enterovirus NOT DETECTED NOT DETECTED Final   Influenza A NOT DETECTED NOT DETECTED Final   Influenza B NOT DETECTED NOT DETECTED Final   Parainfluenza Virus 1 NOT DETECTED NOT DETECTED Final   Parainfluenza Virus 2 NOT DETECTED NOT DETECTED Final   Parainfluenza Virus 3 NOT DETECTED NOT DETECTED Final   Parainfluenza Virus 4 NOT DETECTED NOT DETECTED Final   Respiratory Syncytial Virus NOT DETECTED NOT DETECTED Final   Bordetella pertussis NOT DETECTED NOT DETECTED Final   Bordetella Parapertussis NOT DETECTED NOT DETECTED Final   Chlamydophila pneumoniae NOT DETECTED NOT DETECTED Final   Mycoplasma pneumoniae NOT DETECTED NOT DETECTED Final    Comment: Performed at [facility], [address] [place], [name] [address]  Culture, blood (routine x 2) Call MD if unable to obtain prior to antibiotics being given     Status: None   Collection Time: 02/03/24  4:10 AM   Specimen: BLOOD  Result Value Ref Range Status   Specimen Description BLOOD SITE NOT SPECIFIED  Final   Special Requests   Final    BOTTLES DRAWN AEROBIC AND ANAEROBIC Blood Culture adequate volume   Culture   Final    NO GROWTH 5 DAYS Performed at [facility], [address] [place], [name] [address]    Report Status 02/08/2024 FINAL  Final  Culture, blood (routine x 2) Call MD if unable to obtain prior to antibiotics being given     Status: None   Collection Time: 02/03/24  4:18 AM   Specimen: BLOOD  Result Value Ref Range Status   Specimen Description BLOOD SITE NOT SPECIFIED  Final   Special Requests   Final    BOTTLES DRAWN AEROBIC AND ANAEROBIC Blood Culture adequate volume   Culture   Final    NO GROWTH 5  DAYS Performed at [facility], [address], [place], [name] [address]    Report Status 02/08/2024 FINAL  Final  MRSA Next Gen by PCR, Nasal     Status: None   Collection Time: 02/05/24  3:17 AM   Specimen: Nasal Mucosa; Nasal Swab  Result Value Ref Range Status   MRSA by PCR Next Gen NOT DETECTED NOT DETECTED Final    Comment: (NOTE) The GeneXpert MRSA Assay (FDA approved for NASAL specimens only), is one component of a comprehensive MRSA colonization surveillance program. It is not intended to diagnose MRSA infection nor to guide or monitor treatment for MRSA infections. Test performance is not FDA approved in patients less than 80 years old. Performed at [facility], [address] [place], [name] [address]     Labs: CBC: Recent Labs  Lab 02/20/24 0359 02/21/24 0457 02/25/24 0358  WBC 12.2* 11.5* 12.5*  NEUTROABS  --   --  8.1*  HGB 12.5 12.1 11.5*  HCT 42.8 40.9 38.2  MCV 87.7 87.8 84.7  PLT 150 150 206   Basic Metabolic Panel: Recent Labs  Lab 02/20/24 0359 02/21/24 0457 02/22/24 0647 02/23/24 0451 02/25/24 0358  NA 145 140 142 139 137  K 4.7 4.7 4.6 4.4 4.2  CL 104 103 105 105 99  CO2 29 25 27 23 24   GLUCOSE 212* 232* 241* 220* 178*  BUN 41* 56* 52* 44* 56*  CREATININE 0.87 1.31* 1.02* 0.93 1.02*  CALCIUM  9.2 8.9 8.9 8.8* 8.9   Liver Function Tests: No results for input(s): AST, ALT, ALKPHOS, BILITOT, PROT, ALBUMIN in the last 168 hours. CBG: Recent Labs  Lab 02/24/24 1633 02/24/24 2136 02/25/24 0044 02/25/24 0428 02/25/24 0724  GLUCAP 233* 209* 197* 175* 178*    Discharge time spent: 45 minutes.  Signed: Deliliah Room, MD [facility] 02/25/2024

## 2024-02-25 NOTE — TOC Transition Note (Signed)
 Transition of Care [facility]) - Discharge Note   Patient Details  Name: Christina Bennett MRN: [id number] Date of Birth: 03-29-1941  [facility]) CM/SW Contact:  [name], LCSW Phone Number: 02/25/2024, 11:10 AM   Clinical Narrative:   CSW updated MD about bed available today, sent discharge information to [facility] and confirmed receipt. CSW spoke with niece, [name], and she is in agreement. Transport arranged with [facility] for next available.  Nurse to call report to [phone], Room 119B.    Final next level of care: Skilled Nursing Facility Barriers to Discharge: Barriers Resolved   Patient Goals and CMS Choice            Discharge Placement              Patient chooses bed at: [facility] Patient to be transferred to facility by: [facility] Name of family member notified: Debra Patient and family notified of of transfer: 02/25/24  Discharge Plan and Services Additional resources added to the After Visit Summary for   In-house Referral: Clinical Social Work                                   Social Drivers of Health (SDOH) Interventions SDOH Screenings   Food Insecurity: Patient Unable To Answer (02/03/2024)  Housing: Unknown (02/03/2024)  Transportation Needs: Patient Unable To Answer (02/03/2024)  Utilities: Patient Unable To Answer (02/03/2024)  Social Connections: Patient Unable To Answer (02/03/2024)  Tobacco Use: Medium Risk (02/03/2024)     Readmission Risk Interventions     No data to display

## 2024-02-25 NOTE — Plan of Care (Signed)
  Problem: Clinical Measurements: Goal: Respiratory complications will improve Outcome: Progressing Goal: Cardiovascular complication will be avoided Outcome: Progressing   Problem: Elimination: Goal: Will not experience complications related to urinary retention Outcome: Progressing   Problem: Health Behavior/Discharge Planning: Goal: Ability to manage health-related needs will improve Outcome: Not Progressing   Problem: Skin Integrity: Goal: Risk for impaired skin integrity will decrease Outcome: Not Progressing   Problem: Activity: Goal: Risk for activity intolerance will decrease Outcome: Not Progressing   Problem: Coping: Goal: Level of anxiety will decrease Outcome: Not Progressing   Problem: Pain Managment: Goal: General experience of comfort will improve and/or be controlled Outcome: Not Progressing

## 2024-02-25 NOTE — TOC Progression Note (Signed)
 Transition of Care [facility]) - Progression Note    Patient Details  Name: Christina Bennett MRN: [id number] Date of Birth: 12-23-1940  [facility]) CM/SW Contact  [name], [name] Phone Number: 02/25/2024, 11:09 AM  Clinical Narrative:   CSW confirmed with [facility] that they will have a bed available tomorrow. CSW spoke with niece, [name], to confirm. CSW to follow.    Expected Discharge Plan: Skilled Nursing Facility Barriers to Discharge: Continued Medical Work up, English as a second language teacher  Expected Discharge Plan and Services In-house Referral: Clinical Social Work     Living arrangements for the past 2 months: Single Family Home Expected Discharge Date: 02/25/24                                     Social Determinants of Health (SDOH) Interventions SDOH Screenings   Food Insecurity: Patient Unable To Answer (02/03/2024)  Housing: Unknown (02/03/2024)  Transportation Needs: Patient Unable To Answer (02/03/2024)  Utilities: Patient Unable To Answer (02/03/2024)  Social Connections: Patient Unable To Answer (02/03/2024)  Tobacco Use: Medium Risk (02/03/2024)    Readmission Risk Interventions     No data to display

## 2024-02-26 DIAGNOSIS — I69822 Dysarthria following other cerebrovascular disease: Secondary | ICD-10-CM | POA: Diagnosis not present

## 2024-02-26 DIAGNOSIS — N1831 Chronic kidney disease, stage 3a: Secondary | ICD-10-CM | POA: Diagnosis not present

## 2024-02-26 DIAGNOSIS — Z931 Gastrostomy status: Secondary | ICD-10-CM | POA: Diagnosis not present

## 2024-02-26 DIAGNOSIS — D509 Iron deficiency anemia, unspecified: Secondary | ICD-10-CM | POA: Diagnosis not present

## 2024-02-26 DIAGNOSIS — E785 Hyperlipidemia, unspecified: Secondary | ICD-10-CM | POA: Diagnosis not present

## 2024-02-26 DIAGNOSIS — I69391 Dysphagia following cerebral infarction: Secondary | ICD-10-CM | POA: Diagnosis not present

## 2024-02-26 DIAGNOSIS — E559 Vitamin D deficiency, unspecified: Secondary | ICD-10-CM | POA: Diagnosis not present

## 2024-02-26 DIAGNOSIS — J449 Chronic obstructive pulmonary disease, unspecified: Secondary | ICD-10-CM | POA: Diagnosis not present

## 2024-02-26 DIAGNOSIS — I63541 Cerebral infarction due to unspecified occlusion or stenosis of right cerebellar artery: Secondary | ICD-10-CM | POA: Diagnosis not present

## 2024-02-26 DIAGNOSIS — E1165 Type 2 diabetes mellitus with hyperglycemia: Secondary | ICD-10-CM | POA: Diagnosis not present

## 2024-02-26 DIAGNOSIS — M6281 Muscle weakness (generalized): Secondary | ICD-10-CM | POA: Diagnosis not present

## 2024-02-27 DIAGNOSIS — Z7409 Other reduced mobility: Secondary | ICD-10-CM | POA: Diagnosis not present

## 2024-02-27 DIAGNOSIS — T50905A Adverse effect of unspecified drugs, medicaments and biological substances, initial encounter: Secondary | ICD-10-CM | POA: Diagnosis not present

## 2024-02-27 DIAGNOSIS — R197 Diarrhea, unspecified: Secondary | ICD-10-CM | POA: Diagnosis not present

## 2024-02-27 DIAGNOSIS — I63541 Cerebral infarction due to unspecified occlusion or stenosis of right cerebellar artery: Secondary | ICD-10-CM | POA: Diagnosis not present

## 2024-02-27 DIAGNOSIS — Z9289 Personal history of other medical treatment: Secondary | ICD-10-CM | POA: Diagnosis not present

## 2024-02-27 DIAGNOSIS — M6281 Muscle weakness (generalized): Secondary | ICD-10-CM | POA: Diagnosis not present

## 2024-02-27 DIAGNOSIS — I69391 Dysphagia following cerebral infarction: Secondary | ICD-10-CM | POA: Diagnosis not present

## 2024-02-27 DIAGNOSIS — Z931 Gastrostomy status: Secondary | ICD-10-CM | POA: Diagnosis not present

## 2024-02-28 DIAGNOSIS — T50905A Adverse effect of unspecified drugs, medicaments and biological substances, initial encounter: Secondary | ICD-10-CM | POA: Diagnosis not present

## 2024-02-28 DIAGNOSIS — Z7409 Other reduced mobility: Secondary | ICD-10-CM | POA: Diagnosis not present

## 2024-02-28 DIAGNOSIS — I69391 Dysphagia following cerebral infarction: Secondary | ICD-10-CM | POA: Diagnosis not present

## 2024-02-28 DIAGNOSIS — I63541 Cerebral infarction due to unspecified occlusion or stenosis of right cerebellar artery: Secondary | ICD-10-CM | POA: Diagnosis not present

## 2024-02-28 DIAGNOSIS — Z9289 Personal history of other medical treatment: Secondary | ICD-10-CM | POA: Diagnosis not present

## 2024-02-28 DIAGNOSIS — Z931 Gastrostomy status: Secondary | ICD-10-CM | POA: Diagnosis not present

## 2024-02-28 DIAGNOSIS — M6281 Muscle weakness (generalized): Secondary | ICD-10-CM | POA: Diagnosis not present

## 2024-02-28 DIAGNOSIS — R197 Diarrhea, unspecified: Secondary | ICD-10-CM | POA: Diagnosis not present

## 2024-02-29 DIAGNOSIS — Z931 Gastrostomy status: Secondary | ICD-10-CM | POA: Diagnosis not present

## 2024-02-29 DIAGNOSIS — I69391 Dysphagia following cerebral infarction: Secondary | ICD-10-CM | POA: Diagnosis not present

## 2024-02-29 DIAGNOSIS — T50905A Adverse effect of unspecified drugs, medicaments and biological substances, initial encounter: Secondary | ICD-10-CM | POA: Diagnosis not present

## 2024-02-29 DIAGNOSIS — Z9289 Personal history of other medical treatment: Secondary | ICD-10-CM | POA: Diagnosis not present

## 2024-02-29 DIAGNOSIS — Z789 Other specified health status: Secondary | ICD-10-CM | POA: Diagnosis not present

## 2024-02-29 DIAGNOSIS — R197 Diarrhea, unspecified: Secondary | ICD-10-CM | POA: Diagnosis not present

## 2024-02-29 DIAGNOSIS — J309 Allergic rhinitis, unspecified: Secondary | ICD-10-CM | POA: Diagnosis not present

## 2024-02-29 DIAGNOSIS — Z7409 Other reduced mobility: Secondary | ICD-10-CM | POA: Diagnosis not present

## 2024-02-29 DIAGNOSIS — M6281 Muscle weakness (generalized): Secondary | ICD-10-CM | POA: Diagnosis not present

## 2024-02-29 DIAGNOSIS — I63541 Cerebral infarction due to unspecified occlusion or stenosis of right cerebellar artery: Secondary | ICD-10-CM | POA: Diagnosis not present

## 2024-03-01 DIAGNOSIS — Z931 Gastrostomy status: Secondary | ICD-10-CM | POA: Diagnosis not present

## 2024-03-01 DIAGNOSIS — M6281 Muscle weakness (generalized): Secondary | ICD-10-CM | POA: Diagnosis not present

## 2024-03-01 DIAGNOSIS — J449 Chronic obstructive pulmonary disease, unspecified: Secondary | ICD-10-CM | POA: Diagnosis not present

## 2024-03-01 DIAGNOSIS — T50905A Adverse effect of unspecified drugs, medicaments and biological substances, initial encounter: Secondary | ICD-10-CM | POA: Diagnosis not present

## 2024-03-01 DIAGNOSIS — I69391 Dysphagia following cerebral infarction: Secondary | ICD-10-CM | POA: Diagnosis not present

## 2024-03-01 DIAGNOSIS — Z9289 Personal history of other medical treatment: Secondary | ICD-10-CM | POA: Diagnosis not present

## 2024-03-01 DIAGNOSIS — B370 Candidal stomatitis: Secondary | ICD-10-CM | POA: Diagnosis not present

## 2024-03-01 DIAGNOSIS — Z7409 Other reduced mobility: Secondary | ICD-10-CM | POA: Diagnosis not present

## 2024-03-01 DIAGNOSIS — J309 Allergic rhinitis, unspecified: Secondary | ICD-10-CM | POA: Diagnosis not present

## 2024-03-01 DIAGNOSIS — R197 Diarrhea, unspecified: Secondary | ICD-10-CM | POA: Diagnosis not present

## 2024-03-02 DIAGNOSIS — R197 Diarrhea, unspecified: Secondary | ICD-10-CM | POA: Diagnosis not present

## 2024-03-02 DIAGNOSIS — Z7409 Other reduced mobility: Secondary | ICD-10-CM | POA: Diagnosis not present

## 2024-03-02 DIAGNOSIS — M6281 Muscle weakness (generalized): Secondary | ICD-10-CM | POA: Diagnosis not present

## 2024-03-02 DIAGNOSIS — I69391 Dysphagia following cerebral infarction: Secondary | ICD-10-CM | POA: Diagnosis not present

## 2024-03-02 DIAGNOSIS — J449 Chronic obstructive pulmonary disease, unspecified: Secondary | ICD-10-CM | POA: Diagnosis not present

## 2024-03-02 DIAGNOSIS — R310 Gross hematuria: Secondary | ICD-10-CM | POA: Diagnosis not present

## 2024-03-02 DIAGNOSIS — Z8673 Personal history of transient ischemic attack (TIA), and cerebral infarction without residual deficits: Secondary | ICD-10-CM | POA: Diagnosis not present

## 2024-03-03 DIAGNOSIS — I69822 Dysarthria following other cerebrovascular disease: Secondary | ICD-10-CM | POA: Diagnosis not present

## 2024-03-03 DIAGNOSIS — I509 Heart failure, unspecified: Secondary | ICD-10-CM | POA: Diagnosis not present

## 2024-03-03 DIAGNOSIS — J449 Chronic obstructive pulmonary disease, unspecified: Secondary | ICD-10-CM | POA: Diagnosis not present

## 2024-03-03 DIAGNOSIS — I69391 Dysphagia following cerebral infarction: Secondary | ICD-10-CM | POA: Diagnosis not present

## 2024-03-03 DIAGNOSIS — Z789 Other specified health status: Secondary | ICD-10-CM | POA: Diagnosis not present

## 2024-03-03 DIAGNOSIS — I69359 Hemiplegia and hemiparesis following cerebral infarction affecting unspecified side: Secondary | ICD-10-CM | POA: Diagnosis not present

## 2024-03-03 DIAGNOSIS — R062 Wheezing: Secondary | ICD-10-CM | POA: Diagnosis not present

## 2024-03-03 DIAGNOSIS — I63541 Cerebral infarction due to unspecified occlusion or stenosis of right cerebellar artery: Secondary | ICD-10-CM | POA: Diagnosis not present

## 2024-03-03 DIAGNOSIS — Z931 Gastrostomy status: Secondary | ICD-10-CM | POA: Diagnosis not present

## 2024-03-04 DIAGNOSIS — M6281 Muscle weakness (generalized): Secondary | ICD-10-CM | POA: Diagnosis not present

## 2024-03-04 DIAGNOSIS — Z7409 Other reduced mobility: Secondary | ICD-10-CM | POA: Diagnosis not present

## 2024-03-04 DIAGNOSIS — J441 Chronic obstructive pulmonary disease with (acute) exacerbation: Secondary | ICD-10-CM | POA: Diagnosis not present

## 2024-03-04 DIAGNOSIS — Z789 Other specified health status: Secondary | ICD-10-CM | POA: Diagnosis not present

## 2024-03-04 DIAGNOSIS — Z8673 Personal history of transient ischemic attack (TIA), and cerebral infarction without residual deficits: Secondary | ICD-10-CM | POA: Diagnosis not present

## 2024-03-04 DIAGNOSIS — Z931 Gastrostomy status: Secondary | ICD-10-CM | POA: Diagnosis not present

## 2024-03-04 DIAGNOSIS — I69391 Dysphagia following cerebral infarction: Secondary | ICD-10-CM | POA: Diagnosis not present

## 2024-03-05 DIAGNOSIS — J441 Chronic obstructive pulmonary disease with (acute) exacerbation: Secondary | ICD-10-CM | POA: Diagnosis not present

## 2024-03-05 DIAGNOSIS — Z7409 Other reduced mobility: Secondary | ICD-10-CM | POA: Diagnosis not present

## 2024-03-06 DIAGNOSIS — M6281 Muscle weakness (generalized): Secondary | ICD-10-CM | POA: Diagnosis not present

## 2024-03-06 DIAGNOSIS — J441 Chronic obstructive pulmonary disease with (acute) exacerbation: Secondary | ICD-10-CM | POA: Diagnosis not present

## 2024-03-06 DIAGNOSIS — Z7409 Other reduced mobility: Secondary | ICD-10-CM | POA: Diagnosis not present

## 2024-03-07 DIAGNOSIS — M6281 Muscle weakness (generalized): Secondary | ICD-10-CM | POA: Diagnosis not present

## 2024-03-07 DIAGNOSIS — R079 Chest pain, unspecified: Secondary | ICD-10-CM | POA: Diagnosis not present

## 2024-03-07 DIAGNOSIS — J441 Chronic obstructive pulmonary disease with (acute) exacerbation: Secondary | ICD-10-CM | POA: Diagnosis not present

## 2024-03-07 DIAGNOSIS — Z7409 Other reduced mobility: Secondary | ICD-10-CM | POA: Diagnosis not present

## 2024-03-08 DIAGNOSIS — R310 Gross hematuria: Secondary | ICD-10-CM | POA: Diagnosis not present

## 2024-03-08 DIAGNOSIS — J441 Chronic obstructive pulmonary disease with (acute) exacerbation: Secondary | ICD-10-CM | POA: Diagnosis not present

## 2024-03-08 DIAGNOSIS — Z7409 Other reduced mobility: Secondary | ICD-10-CM | POA: Diagnosis not present

## 2024-03-08 DIAGNOSIS — M6281 Muscle weakness (generalized): Secondary | ICD-10-CM | POA: Diagnosis not present

## 2024-03-08 DIAGNOSIS — E1165 Type 2 diabetes mellitus with hyperglycemia: Secondary | ICD-10-CM | POA: Diagnosis not present

## 2024-03-09 DIAGNOSIS — I69391 Dysphagia following cerebral infarction: Secondary | ICD-10-CM | POA: Diagnosis not present

## 2024-03-09 DIAGNOSIS — Z931 Gastrostomy status: Secondary | ICD-10-CM | POA: Diagnosis not present

## 2024-03-09 DIAGNOSIS — Z7409 Other reduced mobility: Secondary | ICD-10-CM | POA: Diagnosis not present

## 2024-03-09 DIAGNOSIS — J441 Chronic obstructive pulmonary disease with (acute) exacerbation: Secondary | ICD-10-CM | POA: Diagnosis not present

## 2024-03-09 DIAGNOSIS — Z789 Other specified health status: Secondary | ICD-10-CM | POA: Diagnosis not present

## 2024-03-09 DIAGNOSIS — Z8673 Personal history of transient ischemic attack (TIA), and cerebral infarction without residual deficits: Secondary | ICD-10-CM | POA: Diagnosis not present

## 2024-03-11 DIAGNOSIS — I69391 Dysphagia following cerebral infarction: Secondary | ICD-10-CM | POA: Diagnosis not present

## 2024-03-11 DIAGNOSIS — Z789 Other specified health status: Secondary | ICD-10-CM | POA: Diagnosis not present

## 2024-03-11 DIAGNOSIS — Z931 Gastrostomy status: Secondary | ICD-10-CM | POA: Diagnosis not present

## 2024-03-11 DIAGNOSIS — J441 Chronic obstructive pulmonary disease with (acute) exacerbation: Secondary | ICD-10-CM | POA: Diagnosis not present

## 2024-03-11 DIAGNOSIS — Z7409 Other reduced mobility: Secondary | ICD-10-CM | POA: Diagnosis not present

## 2024-03-11 DIAGNOSIS — Z8673 Personal history of transient ischemic attack (TIA), and cerebral infarction without residual deficits: Secondary | ICD-10-CM | POA: Diagnosis not present

## 2024-03-12 DIAGNOSIS — J441 Chronic obstructive pulmonary disease with (acute) exacerbation: Secondary | ICD-10-CM | POA: Diagnosis not present

## 2024-03-12 DIAGNOSIS — I69391 Dysphagia following cerebral infarction: Secondary | ICD-10-CM | POA: Diagnosis not present

## 2024-03-12 DIAGNOSIS — Z789 Other specified health status: Secondary | ICD-10-CM | POA: Diagnosis not present

## 2024-03-12 DIAGNOSIS — Z8673 Personal history of transient ischemic attack (TIA), and cerebral infarction without residual deficits: Secondary | ICD-10-CM | POA: Diagnosis not present

## 2024-03-12 DIAGNOSIS — Z931 Gastrostomy status: Secondary | ICD-10-CM | POA: Diagnosis not present

## 2024-03-12 DIAGNOSIS — Z7409 Other reduced mobility: Secondary | ICD-10-CM | POA: Diagnosis not present

## 2024-03-14 DIAGNOSIS — I69391 Dysphagia following cerebral infarction: Secondary | ICD-10-CM | POA: Diagnosis not present

## 2024-03-14 DIAGNOSIS — Z789 Other specified health status: Secondary | ICD-10-CM | POA: Diagnosis not present

## 2024-03-14 DIAGNOSIS — Z8673 Personal history of transient ischemic attack (TIA), and cerebral infarction without residual deficits: Secondary | ICD-10-CM | POA: Diagnosis not present

## 2024-03-14 DIAGNOSIS — J441 Chronic obstructive pulmonary disease with (acute) exacerbation: Secondary | ICD-10-CM | POA: Diagnosis not present

## 2024-03-14 DIAGNOSIS — Z7409 Other reduced mobility: Secondary | ICD-10-CM | POA: Diagnosis not present

## 2024-03-14 DIAGNOSIS — Z931 Gastrostomy status: Secondary | ICD-10-CM | POA: Diagnosis not present

## 2024-03-15 DIAGNOSIS — M6281 Muscle weakness (generalized): Secondary | ICD-10-CM | POA: Diagnosis not present

## 2024-03-15 DIAGNOSIS — Z8673 Personal history of transient ischemic attack (TIA), and cerebral infarction without residual deficits: Secondary | ICD-10-CM | POA: Diagnosis not present

## 2024-03-15 DIAGNOSIS — I69391 Dysphagia following cerebral infarction: Secondary | ICD-10-CM | POA: Diagnosis not present

## 2024-03-15 DIAGNOSIS — Z7409 Other reduced mobility: Secondary | ICD-10-CM | POA: Diagnosis not present

## 2024-03-15 DIAGNOSIS — Z931 Gastrostomy status: Secondary | ICD-10-CM | POA: Diagnosis not present

## 2024-03-15 DIAGNOSIS — Z8709 Personal history of other diseases of the respiratory system: Secondary | ICD-10-CM | POA: Diagnosis not present

## 2024-03-17 DIAGNOSIS — J449 Chronic obstructive pulmonary disease, unspecified: Secondary | ICD-10-CM | POA: Diagnosis not present

## 2024-03-17 DIAGNOSIS — I63541 Cerebral infarction due to unspecified occlusion or stenosis of right cerebellar artery: Secondary | ICD-10-CM | POA: Diagnosis not present

## 2024-03-17 DIAGNOSIS — J690 Pneumonitis due to inhalation of food and vomit: Secondary | ICD-10-CM | POA: Diagnosis not present

## 2024-03-17 DIAGNOSIS — Z931 Gastrostomy status: Secondary | ICD-10-CM | POA: Diagnosis not present

## 2024-03-17 DIAGNOSIS — Z7409 Other reduced mobility: Secondary | ICD-10-CM | POA: Diagnosis not present

## 2024-03-17 DIAGNOSIS — M6281 Muscle weakness (generalized): Secondary | ICD-10-CM | POA: Diagnosis not present

## 2024-03-17 DIAGNOSIS — J441 Chronic obstructive pulmonary disease with (acute) exacerbation: Secondary | ICD-10-CM | POA: Diagnosis not present

## 2024-03-17 DIAGNOSIS — I69391 Dysphagia following cerebral infarction: Secondary | ICD-10-CM | POA: Diagnosis not present

## 2024-03-22 DIAGNOSIS — R1013 Epigastric pain: Secondary | ICD-10-CM | POA: Diagnosis not present

## 2024-03-22 DIAGNOSIS — Z931 Gastrostomy status: Secondary | ICD-10-CM | POA: Diagnosis not present

## 2024-03-22 DIAGNOSIS — R079 Chest pain, unspecified: Secondary | ICD-10-CM | POA: Diagnosis not present

## 2024-03-22 DIAGNOSIS — I69391 Dysphagia following cerebral infarction: Secondary | ICD-10-CM | POA: Diagnosis not present

## 2024-03-22 DIAGNOSIS — J449 Chronic obstructive pulmonary disease, unspecified: Secondary | ICD-10-CM | POA: Diagnosis not present

## 2024-03-22 DIAGNOSIS — Z789 Other specified health status: Secondary | ICD-10-CM | POA: Diagnosis not present

## 2024-03-23 DIAGNOSIS — I509 Heart failure, unspecified: Secondary | ICD-10-CM | POA: Diagnosis not present

## 2024-03-23 DIAGNOSIS — Z931 Gastrostomy status: Secondary | ICD-10-CM | POA: Diagnosis not present

## 2024-03-23 DIAGNOSIS — M6281 Muscle weakness (generalized): Secondary | ICD-10-CM | POA: Diagnosis not present

## 2024-03-23 DIAGNOSIS — R079 Chest pain, unspecified: Secondary | ICD-10-CM | POA: Diagnosis not present

## 2024-03-23 DIAGNOSIS — Z7409 Other reduced mobility: Secondary | ICD-10-CM | POA: Diagnosis not present

## 2024-03-23 DIAGNOSIS — Z8709 Personal history of other diseases of the respiratory system: Secondary | ICD-10-CM | POA: Diagnosis not present

## 2024-03-23 DIAGNOSIS — I69391 Dysphagia following cerebral infarction: Secondary | ICD-10-CM | POA: Diagnosis not present

## 2024-03-24 DIAGNOSIS — J449 Chronic obstructive pulmonary disease, unspecified: Secondary | ICD-10-CM | POA: Diagnosis not present

## 2024-03-24 DIAGNOSIS — M6281 Muscle weakness (generalized): Secondary | ICD-10-CM | POA: Diagnosis not present

## 2024-03-24 DIAGNOSIS — Z789 Other specified health status: Secondary | ICD-10-CM | POA: Diagnosis not present

## 2024-03-24 DIAGNOSIS — I509 Heart failure, unspecified: Secondary | ICD-10-CM | POA: Diagnosis not present

## 2024-03-24 DIAGNOSIS — N1831 Chronic kidney disease, stage 3a: Secondary | ICD-10-CM | POA: Diagnosis not present

## 2024-03-24 DIAGNOSIS — Z7409 Other reduced mobility: Secondary | ICD-10-CM | POA: Diagnosis not present

## 2024-03-24 DIAGNOSIS — I69391 Dysphagia following cerebral infarction: Secondary | ICD-10-CM | POA: Diagnosis not present

## 2024-03-24 DIAGNOSIS — E1165 Type 2 diabetes mellitus with hyperglycemia: Secondary | ICD-10-CM | POA: Diagnosis not present

## 2024-03-24 DIAGNOSIS — I69822 Dysarthria following other cerebrovascular disease: Secondary | ICD-10-CM | POA: Diagnosis not present

## 2024-03-24 DIAGNOSIS — I69359 Hemiplegia and hemiparesis following cerebral infarction affecting unspecified side: Secondary | ICD-10-CM | POA: Diagnosis not present

## 2024-03-24 DIAGNOSIS — I63541 Cerebral infarction due to unspecified occlusion or stenosis of right cerebellar artery: Secondary | ICD-10-CM | POA: Diagnosis not present

## 2024-03-24 DIAGNOSIS — Z931 Gastrostomy status: Secondary | ICD-10-CM | POA: Diagnosis not present

## 2024-03-25 DIAGNOSIS — E1165 Type 2 diabetes mellitus with hyperglycemia: Secondary | ICD-10-CM | POA: Diagnosis not present

## 2024-03-25 DIAGNOSIS — I69391 Dysphagia following cerebral infarction: Secondary | ICD-10-CM | POA: Diagnosis not present

## 2024-03-25 DIAGNOSIS — M6281 Muscle weakness (generalized): Secondary | ICD-10-CM | POA: Diagnosis not present

## 2024-03-25 DIAGNOSIS — Z7409 Other reduced mobility: Secondary | ICD-10-CM | POA: Diagnosis not present

## 2024-03-29 DIAGNOSIS — Z931 Gastrostomy status: Secondary | ICD-10-CM | POA: Diagnosis not present

## 2024-03-29 DIAGNOSIS — M6281 Muscle weakness (generalized): Secondary | ICD-10-CM | POA: Diagnosis not present

## 2024-03-29 DIAGNOSIS — Z7409 Other reduced mobility: Secondary | ICD-10-CM | POA: Diagnosis not present

## 2024-03-29 DIAGNOSIS — Z8673 Personal history of transient ischemic attack (TIA), and cerebral infarction without residual deficits: Secondary | ICD-10-CM | POA: Diagnosis not present

## 2024-03-29 DIAGNOSIS — I69359 Hemiplegia and hemiparesis following cerebral infarction affecting unspecified side: Secondary | ICD-10-CM | POA: Diagnosis not present

## 2024-03-29 DIAGNOSIS — I69391 Dysphagia following cerebral infarction: Secondary | ICD-10-CM | POA: Diagnosis not present

## 2024-03-29 DIAGNOSIS — I63541 Cerebral infarction due to unspecified occlusion or stenosis of right cerebellar artery: Secondary | ICD-10-CM | POA: Diagnosis not present

## 2024-03-30 ENCOUNTER — Ambulatory Visit: Payer: Self-pay | Admitting: Neurology

## 2024-03-30 VITALS — BP 148/65 | HR 94

## 2024-03-30 DIAGNOSIS — I10 Essential (primary) hypertension: Secondary | ICD-10-CM | POA: Diagnosis not present

## 2024-03-30 DIAGNOSIS — R1312 Dysphagia, oropharyngeal phase: Secondary | ICD-10-CM

## 2024-03-30 DIAGNOSIS — E782 Mixed hyperlipidemia: Secondary | ICD-10-CM

## 2024-03-30 DIAGNOSIS — I639 Cerebral infarction, unspecified: Secondary | ICD-10-CM | POA: Diagnosis not present

## 2024-03-30 DIAGNOSIS — J449 Chronic obstructive pulmonary disease, unspecified: Secondary | ICD-10-CM | POA: Diagnosis not present

## 2024-03-30 DIAGNOSIS — I69391 Dysphagia following cerebral infarction: Secondary | ICD-10-CM | POA: Diagnosis not present

## 2024-03-30 DIAGNOSIS — E1165 Type 2 diabetes mellitus with hyperglycemia: Secondary | ICD-10-CM | POA: Diagnosis not present

## 2024-03-30 DIAGNOSIS — Z7409 Other reduced mobility: Secondary | ICD-10-CM | POA: Diagnosis not present

## 2024-03-30 DIAGNOSIS — M6281 Muscle weakness (generalized): Secondary | ICD-10-CM | POA: Diagnosis not present

## 2024-03-30 DIAGNOSIS — I509 Heart failure, unspecified: Secondary | ICD-10-CM | POA: Diagnosis not present

## 2024-03-30 DIAGNOSIS — Z8673 Personal history of transient ischemic attack (TIA), and cerebral infarction without residual deficits: Secondary | ICD-10-CM | POA: Diagnosis not present

## 2024-03-30 NOTE — Progress Notes (Signed)
 Patient: Christina Bennett Date of Birth: 03/24/1941  Reason for Visit: Stroke Clinic Follow Up  History from: Patient, niece [name] Primary Neurologist: Rosemarie   ASSESSMENT AND PLAN 83 y.o. 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  Presented with left-sided weakness.  Vascular risk factors: HTN, HLD, DM.  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 Difficulty with short term memory since CVA.  - Continue Plavix  75 mg daily for secondary stroke prevention - Strict management of vascular risk factors with a goal BP less than 130/90, A1c less than 7.0, LDL less than 70 for secondary stroke prevention - Visibly uncomfortable today from being upright in wheelchair, also complaining of left arm pain.  Advised to work with facility on management/evaluation of pain concerns.  Continue work with PT.  Supposed to have swallow reevaluation tomorrow. - Return here on an as-needed basis  HISTORY OF PRESENT ILLNESS: Today 03/30/24 Here with her niece, [name]. At SNF.  Very uncomfortable in wheelchair due to reported pressure sores.  She has a feeding tube.  She has poststroke dysphagia. Nothing by mouth. She is visibly uncomfortable in her wheelchair, moaning.  Poor historian.  Asking for something for pain.  Has been having diarrhea from constant tube feeds.  Mostly stays in the bed, does some ambulation with PT.  She is incontinent of bowel and bladder.  Prior to stroke she lives alone, did some driving.  Since stroke difficulty with memory.  Complains of general pain all over.  Complains of left arm pain, with elevation upper arm.  Stopped smoking 40 years ago.  Supposed to have swallow evaluation tomorrow.  Is asking for something to drink.  On Plavix  75 mg daily.  BP little bit up 148/65.  Family has applied for Medicaid, will stay at SNF, does not have any family members to care for her at home.   HISTORY  Admitted to the hospital 6/23 for generalized weakness,  headache, poor appetite and blurred vision.  While admitted in early morning of 6/26 had sudden onset left-sided weakness and tingling.  Code stroke was activated and she was given TNK.  Right pontine infarct etiology likely small vessel disease.  It was felt that she may have UTI/pneumonia, prolactin was negative antibiotics were discontinued and felt she may have pulmonary edema.  She had post stroke dysphagia.  -Code stroke CT head no acute abnormality.  Small vessel disease.  Aspects 10. - CTA head and neck no LVO, proximal right P2 stenosis, moderate stenosis at origin of right vertebral artery - MRI of the brain 1.7 cm acute ischemic right pontine infarct - 2D echo EF 55 to 60% - LDL 100 switch to Lipitor 40 mg - A1c 9.2 - No antithrombotic prior to admission, placed on Plavix  75 mg daily alone due to aspirin allergy  REVIEW OF SYSTEMS: Out of a complete 14 system review of symptoms, the patient complains only of the following symptoms, and all other reviewed systems are negative.  See HPI  ALLERGIES: Allergies  Allergen Reactions   Aspirin Swelling and Rash    Throat swells.   Bee Venom Anaphylaxis   Ibuprofen Shortness Of Breath and Swelling    Throat swells.   Percocet [Oxycodone-Acetaminophen ] Itching    HOME MEDICATIONS: Outpatient Medications Prior to Visit  Medication Sig Dispense Refill   albuterol  (PROVENTIL  HFA;VENTOLIN  HFA) 108 (90 BASE) MCG/ACT inhaler Inhale 2 puffs into the lungs every 4 (four) hours as needed for wheezing or shortness of breath.  arformoterol  (BROVANA ) 15 MCG/2ML NEBU Take 2 mLs (15 mcg total) by nebulization 2 (two) times daily.     atorvastatin  (LIPITOR) 40 MG tablet Take 1 tablet (40 mg total) by mouth daily. 30 tablet 0   budesonide  (PULMICORT ) 0.25 MG/2ML nebulizer solution Take 2 mLs (0.25 mg total) by nebulization 2 (two) times daily.     calcium  carbonate (TUMS - DOSED IN MG ELEMENTAL CALCIUM ) 500 MG chewable tablet Chew 1 tablet (200  mg of elemental calcium  total) by mouth every 2 (two) hours as needed for indigestion or heartburn. 15 tablet 0   clopidogrel  (PLAVIX ) 75 MG tablet Take 1 tablet (75 mg total) by mouth daily. 30 tablet 0   fluticasone (FLONASE) 50 MCG/ACT nasal spray Place 2 sprays into both nostrils every evening.      furosemide  (LASIX ) 20 MG tablet Take 1 tablet (20 mg total) by mouth daily. 30 tablet 0   guaiFENesin  (MUCINEX ) 600 MG 12 hr tablet Take 1 tablet (600 mg total) by mouth 2 (two) times daily as needed for cough or to loosen phlegm. 20 tablet 0   HYDROcodone -acetaminophen  (NORCO) 7.5-325 MG tablet Take 1 tablet by mouth every 6 (six) hours as needed for severe pain (pain score 7-10). 20 tablet 0   hydrOXYzine  (ATARAX ) 10 MG tablet Take 1 tablet (10 mg total) by mouth 3 (three) times daily as needed for anxiety or itching. 15 tablet 0   insulin  aspart (NOVOLOG ) 100 UNIT/ML injection Inject 3 Units into the skin every 4 (four) hours. 10 mL 0   insulin  glargine-yfgn (SEMGLEE ) 100 UNIT/ML Pen Inject 22 Units into the skin daily. 9 mL 1   ipratropium-albuterol  (DUONEB) 0.5-2.5 (3) MG/3ML SOLN Take 3 mLs by nebulization every 6 (six) hours as needed.     losartan  (COZAAR ) 50 MG tablet Take 1 tablet (50 mg total) by mouth daily. 30 tablet 0   melatonin 5 MG TABS Take 1 tablet (5 mg total) by mouth at bedtime as needed (insomnia). 10 tablet 0   montelukast  (SINGULAIR ) 10 MG tablet Take 10 mg by mouth every evening.   2   Mouthwashes (MOUTH RINSE) LIQD solution 15 mLs by Mouth Rinse route as needed (for oral care).     ondansetron  (ZOFRAN ) 4 MG tablet Take 1 tablet (4 mg total) by mouth every 6 (six) hours as needed for nausea. 20 tablet 0   pantoprazole  (PROTONIX ) 40 MG tablet Take 1 tablet (40 mg total) by mouth 2 (two) times daily. 30 tablet 0   phenol (CHLORASEPTIC) 1.4 % LIQD Use as directed 1 spray in the mouth or throat as needed for throat irritation / pain.     polyvinyl alcohol  (LIQUIFILM TEARS) 1.4 %  ophthalmic solution Place 1 drop into the left eye daily.     prednisoLONE  acetate (PRED FORTE ) 1 % ophthalmic suspension Place 1 drop into the left eye daily at 12 noon. 5 mL 0   senna-docusate (SENOKOT-S) 8.6-50 MG tablet Take 1 tablet by mouth at bedtime as needed for mild constipation. 15 tablet 0   sodium chloride  (OCEAN) 0.65 % SOLN nasal spray Place 1 spray into both nostrils as needed for congestion.     Vitamin D, Ergocalciferol, (DRISDOL) 50000 UNITS CAPS Take 50,000 Units by mouth every 14 (fourteen) days.     Water  For Irrigation, Sterile (FREE WATER ) SOLN Place 200 mLs into feeding tube every 4 (four) hours.     No facility-administered medications prior to visit.    PAST MEDICAL HISTORY:  Past Medical History:  Diagnosis Date   Anemia    iron insufion on 05/2016    Atypical chest pain    Atypical CP--stress test, cor angio, and CT chest negative in 2005. Nuclear study normal in 2011    COPD (chronic obstructive pulmonary disease) (HCC)    Diabetes (HCC)    Diabetes type 2- 1/13- metformin started   Dyslipidemia    Endometriosis    with ovarian radiation in 1966, gyn Dr cousins   GERD (gastroesophageal reflux disease)    H/H   Herpes    herpes of the left eye, Dr gust optho   History of DVT (deep vein thrombosis)    on OCP   History of kidney stones    Hypertension    Mild anemia    WBC mild high, lab 2012/ HEMATOLOGY    Osteoarthritis    Osteoarthritis of the hip, knee, and hand, vicodin , prn   Osteopenia    BD in 2013   Tobacco use    quit 03/2008   Vitamin D deficiency     PAST SURGICAL HISTORY: Past Surgical History:  Procedure Laterality Date   APPENDECTOMY     BALLOON DILATION N/A 07/09/2016   Procedure: BALLOON DILATION;  Surgeon: Gladis MARLA Louder, MD;  Location: [facility];  Service: Endoscopy;  Laterality: N/A;   COLONOSCOPY WITH PROPOFOL  N/A 07/09/2016   Procedure: COLONOSCOPY WITH PROPOFOL ;  Surgeon: Gladis MARLA Louder, MD;  Location: [facility];  Service: Endoscopy;  Laterality: N/A;   ESOPHAGOGASTRODUODENOSCOPY N/A 07/09/2016   Procedure: ESOPHAGOGASTRODUODENOSCOPY (EGD);  Surgeon: Gladis MARLA Louder, MD;  Location: [facility];  Service: Endoscopy;  Laterality: N/A;   EYE SURGERY     left eye cataract srugery    HERNIA REPAIR     IR GASTROSTOMY TUBE MOD SED  02/20/2024   surgery fro endometriosis     TONSILLECTOMY      FAMILY HISTORY: Family History  Problem Relation Age of Onset   Hypertension Mother    Heart disease Brother    Heart disease Brother     SOCIAL HISTORY: Social History   Socioeconomic History   Marital status: Single    Spouse name: Not on file   Number of children: Not on file   Years of education: Not on file   Highest education level: Not on file  Occupational History   Not on file  Tobacco Use   Smoking status: Former   Smokeless tobacco: Never  Substance and Sexual Activity   Alcohol  use: No   Drug use: No   Sexual activity: Not on file  Other Topics Concern   Not on file  Social History Narrative   Not on file   Social Drivers of Health   Financial Resource Strain: Not on file  Food Insecurity: Patient Unable To Answer (02/03/2024)   Hunger Vital Sign    Worried About Running Out of Food in the Last Year: Patient unable to answer    Ran Out of Food in the Last Year: Patient unable to answer  Transportation Needs: Patient Unable To Answer (02/03/2024)   PRAPARE - Transportation    Lack of Transportation (Medical): Patient unable to answer    Lack of Transportation (Non-Medical): Patient unable to answer  Physical Activity: Not on file  Stress: Not on file  Social Connections: Patient Unable To Answer (02/03/2024)   Social Connection and Isolation Panel    Frequency of Communication with Friends and Family: Patient unable to answer  Frequency of Social Gatherings with Friends and Family: Patient unable to answer    Attends Religious Services: Patient unable to answer     Active Member of Clubs or Organizations: Patient unable to answer    Attends Banker Meetings: Patient unable to answer    Marital Status: Patient unable to answer  Intimate Partner Violence: Patient Unable To Answer (02/03/2024)   Humiliation, Afraid, Rape, and Kick questionnaire    Fear of Current or Ex-Partner: Patient unable to answer    Emotionally Abused: Patient unable to answer    Physically Abused: Patient unable to answer    Sexually Abused: Patient unable to answer   PHYSICAL EXAM  Vitals:   03/30/24 1003  BP: (!) 148/65  Pulse: 94   There is no height or weight on file to calculate BMI.  Generalized: Well developed, seated in wheelchair, uncomfortable, complaining of pain to buttocks, moaning  Neurological examination  Mentation: Alert oriented to place, situation, focused on her pain, discomfort. Niece provides history. Follows all commands speech. Speech is slurred but understandable, is raspy, soft.  Cranial nerve II-XII: Pupils were equal round reactive to light. Extraocular movements were full, poor vision on the left. Facial sensation and strength were normal.  Decreased shoulder shrug on the left. Motor: 3/5 left upper, weak left grip strength; 4/5 left leg  Sensory: Sensory testing is intact to soft touch on all 4 extremities. No evidence of extinction is noted.  Coordination: Difficulty with finger-nose-finger with the left hand, hard time understanding heel-to-shin command Gait and station: Visibly uncomfortable, was not ambulated today Reflexes: Deep tendon reflexes are symmetric and normal bilaterally.   DIAGNOSTIC DATA (LABS, IMAGING, TESTING) - I reviewed patient records, labs, notes, testing and imaging myself where available.  Lab Results  Component Value Date   WBC 12.5 (H) 02/25/2024   HGB 11.5 (L) 02/25/2024   HCT 38.2 02/25/2024   MCV 84.7 02/25/2024   PLT 206 02/25/2024      Component Value Date/Time   NA 137 02/25/2024 0358    NA 141 05/02/2015 0952   K 4.2 02/25/2024 0358   K 4.2 05/02/2015 0952   CL 99 02/25/2024 0358   CL 104 04/23/2012 1017   CO2 24 02/25/2024 0358   CO2 28 05/02/2015 0952   GLUCOSE 178 (H) 02/25/2024 0358   GLUCOSE 103 05/02/2015 0952   GLUCOSE 121 (H) 04/23/2012 1017   BUN 56 (H) 02/25/2024 0358   BUN 13.0 05/02/2015 0952   CREATININE 1.02 (H) 02/25/2024 0358   CREATININE 0.7 05/02/2015 0952   CALCIUM  8.9 02/25/2024 0358   CALCIUM  9.4 05/02/2015 0952   PROT 6.1 (L) 02/17/2024 0457   PROT 6.6 05/02/2015 0952   ALBUMIN 2.9 (L) 02/17/2024 0457   ALBUMIN 3.5 05/02/2015 0952   AST 12 (L) 02/17/2024 0457   AST 12 05/02/2015 0952   ALT 10 02/17/2024 0457   ALT 7 05/02/2015 0952   ALKPHOS 80 02/17/2024 0457   ALKPHOS 82 05/02/2015 0952   BILITOT 0.6 02/17/2024 0457   BILITOT 0.38 05/02/2015 0952   GFRNONAA 55 (L) 02/25/2024 0358   GFRAA 59 (L) 05/07/2016 0326   Lab Results  Component Value Date   CHOL 178 02/05/2024   HDL 31 (L) 02/05/2024   LDLCALC 100 (H) 02/05/2024   TRIG 233 (H) 02/05/2024   CHOLHDL 5.7 02/05/2024   Lab Results  Component Value Date   HGBA1C 9.2 (H) 02/03/2024   No results found for: [id number] Lab Results  Component Value Date   TSH 1.017 02/03/2024   Lauraine Born, AGNP-C, DNP 03/30/2024, 10:09 AM [facility] [address] [place], [name] [address] [phone]

## 2024-03-30 NOTE — Patient Instructions (Addendum)
 Continue Plavix  75 mg daily for secondary stroke prevention  Strict management of vascular risk factors with a goal BP less than 130/90, A1c less than 7.0, LDL less than 70 for secondary stroke prevention  Follow-up with facility primary care for evaluation of arm pain, buttocks pain  Continue work with physical, speech therapy  Return here on an as-needed basis

## 2024-03-31 DIAGNOSIS — Z8673 Personal history of transient ischemic attack (TIA), and cerebral infarction without residual deficits: Secondary | ICD-10-CM | POA: Diagnosis not present

## 2024-03-31 DIAGNOSIS — M79602 Pain in left arm: Secondary | ICD-10-CM | POA: Diagnosis not present

## 2024-03-31 DIAGNOSIS — M6281 Muscle weakness (generalized): Secondary | ICD-10-CM | POA: Diagnosis not present

## 2024-03-31 DIAGNOSIS — R531 Weakness: Secondary | ICD-10-CM | POA: Diagnosis not present

## 2024-03-31 DIAGNOSIS — I69391 Dysphagia following cerebral infarction: Secondary | ICD-10-CM | POA: Diagnosis not present

## 2024-03-31 DIAGNOSIS — I63541 Cerebral infarction due to unspecified occlusion or stenosis of right cerebellar artery: Secondary | ICD-10-CM | POA: Diagnosis not present

## 2024-03-31 DIAGNOSIS — Z7409 Other reduced mobility: Secondary | ICD-10-CM | POA: Diagnosis not present

## 2024-03-31 DIAGNOSIS — M79622 Pain in left upper arm: Secondary | ICD-10-CM | POA: Diagnosis not present

## 2024-04-01 DIAGNOSIS — M79622 Pain in left upper arm: Secondary | ICD-10-CM | POA: Diagnosis not present

## 2024-04-01 DIAGNOSIS — R531 Weakness: Secondary | ICD-10-CM | POA: Diagnosis not present

## 2024-04-01 DIAGNOSIS — I69391 Dysphagia following cerebral infarction: Secondary | ICD-10-CM | POA: Diagnosis not present

## 2024-04-01 DIAGNOSIS — R52 Pain, unspecified: Secondary | ICD-10-CM | POA: Diagnosis not present

## 2024-04-01 DIAGNOSIS — Z7409 Other reduced mobility: Secondary | ICD-10-CM | POA: Diagnosis not present

## 2024-04-01 DIAGNOSIS — R197 Diarrhea, unspecified: Secondary | ICD-10-CM | POA: Diagnosis not present

## 2024-04-01 DIAGNOSIS — M6281 Muscle weakness (generalized): Secondary | ICD-10-CM | POA: Diagnosis not present

## 2024-04-02 DIAGNOSIS — R310 Gross hematuria: Secondary | ICD-10-CM | POA: Diagnosis not present

## 2024-04-02 DIAGNOSIS — R531 Weakness: Secondary | ICD-10-CM | POA: Diagnosis not present

## 2024-04-02 DIAGNOSIS — M6281 Muscle weakness (generalized): Secondary | ICD-10-CM | POA: Diagnosis not present

## 2024-04-02 DIAGNOSIS — I69391 Dysphagia following cerebral infarction: Secondary | ICD-10-CM | POA: Diagnosis not present

## 2024-04-02 DIAGNOSIS — R109 Unspecified abdominal pain: Secondary | ICD-10-CM | POA: Diagnosis not present

## 2024-04-02 DIAGNOSIS — I63541 Cerebral infarction due to unspecified occlusion or stenosis of right cerebellar artery: Secondary | ICD-10-CM | POA: Diagnosis not present

## 2024-04-02 DIAGNOSIS — Z7409 Other reduced mobility: Secondary | ICD-10-CM | POA: Diagnosis not present

## 2024-04-03 DIAGNOSIS — R197 Diarrhea, unspecified: Secondary | ICD-10-CM | POA: Diagnosis not present

## 2024-04-03 DIAGNOSIS — R531 Weakness: Secondary | ICD-10-CM | POA: Diagnosis not present

## 2024-04-03 DIAGNOSIS — Z7409 Other reduced mobility: Secondary | ICD-10-CM | POA: Diagnosis not present

## 2024-04-04 DIAGNOSIS — R52 Pain, unspecified: Secondary | ICD-10-CM | POA: Diagnosis not present

## 2024-04-04 DIAGNOSIS — R531 Weakness: Secondary | ICD-10-CM | POA: Diagnosis not present

## 2024-04-04 DIAGNOSIS — E1165 Type 2 diabetes mellitus with hyperglycemia: Secondary | ICD-10-CM | POA: Diagnosis not present

## 2024-04-04 DIAGNOSIS — I69391 Dysphagia following cerebral infarction: Secondary | ICD-10-CM | POA: Diagnosis not present

## 2024-04-04 DIAGNOSIS — M6281 Muscle weakness (generalized): Secondary | ICD-10-CM | POA: Diagnosis not present

## 2024-04-04 DIAGNOSIS — Z7409 Other reduced mobility: Secondary | ICD-10-CM | POA: Diagnosis not present

## 2024-04-04 DIAGNOSIS — R197 Diarrhea, unspecified: Secondary | ICD-10-CM | POA: Diagnosis not present

## 2024-04-04 NOTE — Progress Notes (Signed)
 I agree with the above plan

## 2024-04-05 DIAGNOSIS — R52 Pain, unspecified: Secondary | ICD-10-CM | POA: Diagnosis not present

## 2024-04-05 DIAGNOSIS — Z7409 Other reduced mobility: Secondary | ICD-10-CM | POA: Diagnosis not present

## 2024-04-05 DIAGNOSIS — Z931 Gastrostomy status: Secondary | ICD-10-CM | POA: Diagnosis not present

## 2024-04-05 DIAGNOSIS — I69391 Dysphagia following cerebral infarction: Secondary | ICD-10-CM | POA: Diagnosis not present

## 2024-04-05 DIAGNOSIS — M6281 Muscle weakness (generalized): Secondary | ICD-10-CM | POA: Diagnosis not present

## 2024-04-05 DIAGNOSIS — R310 Gross hematuria: Secondary | ICD-10-CM | POA: Diagnosis not present

## 2024-04-05 DIAGNOSIS — R197 Diarrhea, unspecified: Secondary | ICD-10-CM | POA: Diagnosis not present

## 2024-04-05 DIAGNOSIS — I10 Essential (primary) hypertension: Secondary | ICD-10-CM | POA: Diagnosis not present

## 2024-04-05 DIAGNOSIS — I63541 Cerebral infarction due to unspecified occlusion or stenosis of right cerebellar artery: Secondary | ICD-10-CM | POA: Diagnosis not present

## 2024-04-06 DIAGNOSIS — I63541 Cerebral infarction due to unspecified occlusion or stenosis of right cerebellar artery: Secondary | ICD-10-CM | POA: Diagnosis not present

## 2024-04-06 DIAGNOSIS — I69391 Dysphagia following cerebral infarction: Secondary | ICD-10-CM | POA: Diagnosis not present

## 2024-04-06 DIAGNOSIS — M6281 Muscle weakness (generalized): Secondary | ICD-10-CM | POA: Diagnosis not present

## 2024-04-06 DIAGNOSIS — Z931 Gastrostomy status: Secondary | ICD-10-CM | POA: Diagnosis not present

## 2024-04-06 DIAGNOSIS — R197 Diarrhea, unspecified: Secondary | ICD-10-CM | POA: Diagnosis not present

## 2024-04-07 DIAGNOSIS — R059 Cough, unspecified: Secondary | ICD-10-CM | POA: Diagnosis not present

## 2024-04-07 DIAGNOSIS — J449 Chronic obstructive pulmonary disease, unspecified: Secondary | ICD-10-CM | POA: Diagnosis not present

## 2024-04-07 DIAGNOSIS — Z931 Gastrostomy status: Secondary | ICD-10-CM | POA: Diagnosis not present

## 2024-04-07 DIAGNOSIS — Z7409 Other reduced mobility: Secondary | ICD-10-CM | POA: Diagnosis not present

## 2024-04-07 DIAGNOSIS — I63541 Cerebral infarction due to unspecified occlusion or stenosis of right cerebellar artery: Secondary | ICD-10-CM | POA: Diagnosis not present

## 2024-04-07 DIAGNOSIS — M6281 Muscle weakness (generalized): Secondary | ICD-10-CM | POA: Diagnosis not present

## 2024-04-07 DIAGNOSIS — R062 Wheezing: Secondary | ICD-10-CM | POA: Diagnosis not present

## 2024-04-07 DIAGNOSIS — I69391 Dysphagia following cerebral infarction: Secondary | ICD-10-CM | POA: Diagnosis not present

## 2024-04-08 DIAGNOSIS — I63541 Cerebral infarction due to unspecified occlusion or stenosis of right cerebellar artery: Secondary | ICD-10-CM | POA: Diagnosis not present

## 2024-04-08 DIAGNOSIS — Z7409 Other reduced mobility: Secondary | ICD-10-CM | POA: Diagnosis not present

## 2024-04-08 DIAGNOSIS — R062 Wheezing: Secondary | ICD-10-CM | POA: Diagnosis not present

## 2024-04-08 DIAGNOSIS — I69391 Dysphagia following cerebral infarction: Secondary | ICD-10-CM | POA: Diagnosis not present

## 2024-04-08 DIAGNOSIS — R5381 Other malaise: Secondary | ICD-10-CM | POA: Diagnosis not present

## 2024-04-08 DIAGNOSIS — J441 Chronic obstructive pulmonary disease with (acute) exacerbation: Secondary | ICD-10-CM | POA: Diagnosis not present

## 2024-04-08 DIAGNOSIS — M6281 Muscle weakness (generalized): Secondary | ICD-10-CM | POA: Diagnosis not present

## 2024-04-09 DIAGNOSIS — I69391 Dysphagia following cerebral infarction: Secondary | ICD-10-CM | POA: Diagnosis not present

## 2024-04-09 DIAGNOSIS — M6281 Muscle weakness (generalized): Secondary | ICD-10-CM | POA: Diagnosis not present

## 2024-04-09 DIAGNOSIS — R5381 Other malaise: Secondary | ICD-10-CM | POA: Diagnosis not present

## 2024-04-09 DIAGNOSIS — J441 Chronic obstructive pulmonary disease with (acute) exacerbation: Secondary | ICD-10-CM | POA: Diagnosis not present

## 2024-04-09 DIAGNOSIS — I63541 Cerebral infarction due to unspecified occlusion or stenosis of right cerebellar artery: Secondary | ICD-10-CM | POA: Diagnosis not present

## 2024-04-09 DIAGNOSIS — Z7409 Other reduced mobility: Secondary | ICD-10-CM | POA: Diagnosis not present

## 2024-04-09 DIAGNOSIS — Z931 Gastrostomy status: Secondary | ICD-10-CM | POA: Diagnosis not present

## 2024-04-10 DIAGNOSIS — R062 Wheezing: Secondary | ICD-10-CM | POA: Diagnosis not present

## 2024-04-10 DIAGNOSIS — R5381 Other malaise: Secondary | ICD-10-CM | POA: Diagnosis not present

## 2024-04-10 DIAGNOSIS — I63541 Cerebral infarction due to unspecified occlusion or stenosis of right cerebellar artery: Secondary | ICD-10-CM | POA: Diagnosis not present

## 2024-04-10 DIAGNOSIS — Z931 Gastrostomy status: Secondary | ICD-10-CM | POA: Diagnosis not present

## 2024-04-10 DIAGNOSIS — Z7409 Other reduced mobility: Secondary | ICD-10-CM | POA: Diagnosis not present

## 2024-04-10 DIAGNOSIS — J441 Chronic obstructive pulmonary disease with (acute) exacerbation: Secondary | ICD-10-CM | POA: Diagnosis not present

## 2024-04-10 DIAGNOSIS — I69391 Dysphagia following cerebral infarction: Secondary | ICD-10-CM | POA: Diagnosis not present

## 2024-04-10 DIAGNOSIS — M6281 Muscle weakness (generalized): Secondary | ICD-10-CM | POA: Diagnosis not present

## 2024-04-12 DIAGNOSIS — Z931 Gastrostomy status: Secondary | ICD-10-CM | POA: Diagnosis not present

## 2024-04-12 DIAGNOSIS — M6281 Muscle weakness (generalized): Secondary | ICD-10-CM | POA: Diagnosis not present

## 2024-04-12 DIAGNOSIS — J441 Chronic obstructive pulmonary disease with (acute) exacerbation: Secondary | ICD-10-CM | POA: Diagnosis not present

## 2024-04-12 DIAGNOSIS — I69391 Dysphagia following cerebral infarction: Secondary | ICD-10-CM | POA: Diagnosis not present

## 2024-04-12 DIAGNOSIS — Z7409 Other reduced mobility: Secondary | ICD-10-CM | POA: Diagnosis not present

## 2024-04-12 DIAGNOSIS — R5381 Other malaise: Secondary | ICD-10-CM | POA: Diagnosis not present

## 2024-04-13 DIAGNOSIS — I69391 Dysphagia following cerebral infarction: Secondary | ICD-10-CM | POA: Diagnosis not present

## 2024-04-13 DIAGNOSIS — I63541 Cerebral infarction due to unspecified occlusion or stenosis of right cerebellar artery: Secondary | ICD-10-CM | POA: Diagnosis not present

## 2024-04-13 DIAGNOSIS — M6281 Muscle weakness (generalized): Secondary | ICD-10-CM | POA: Diagnosis not present

## 2024-04-14 ENCOUNTER — Encounter (HOSPITAL_COMMUNITY): Payer: Self-pay

## 2024-04-14 DIAGNOSIS — M6281 Muscle weakness (generalized): Secondary | ICD-10-CM | POA: Diagnosis not present

## 2024-04-14 DIAGNOSIS — J449 Chronic obstructive pulmonary disease, unspecified: Secondary | ICD-10-CM | POA: Diagnosis not present

## 2024-04-14 DIAGNOSIS — I63541 Cerebral infarction due to unspecified occlusion or stenosis of right cerebellar artery: Secondary | ICD-10-CM | POA: Diagnosis not present

## 2024-04-14 DIAGNOSIS — I69391 Dysphagia following cerebral infarction: Secondary | ICD-10-CM | POA: Diagnosis not present

## 2024-04-14 DIAGNOSIS — Z931 Gastrostomy status: Secondary | ICD-10-CM | POA: Diagnosis not present

## 2024-04-15 ENCOUNTER — Other Ambulatory Visit (HOSPITAL_COMMUNITY): Payer: Self-pay

## 2024-04-15 DIAGNOSIS — Z431 Encounter for attention to gastrostomy: Secondary | ICD-10-CM

## 2024-04-15 DIAGNOSIS — I69391 Dysphagia following cerebral infarction: Secondary | ICD-10-CM | POA: Diagnosis not present

## 2024-04-15 DIAGNOSIS — I63541 Cerebral infarction due to unspecified occlusion or stenosis of right cerebellar artery: Secondary | ICD-10-CM | POA: Diagnosis not present

## 2024-04-15 DIAGNOSIS — M6281 Muscle weakness (generalized): Secondary | ICD-10-CM | POA: Diagnosis not present

## 2024-04-16 DIAGNOSIS — Z8673 Personal history of transient ischemic attack (TIA), and cerebral infarction without residual deficits: Secondary | ICD-10-CM | POA: Diagnosis not present

## 2024-04-16 DIAGNOSIS — M6281 Muscle weakness (generalized): Secondary | ICD-10-CM | POA: Diagnosis not present

## 2024-04-16 DIAGNOSIS — J449 Chronic obstructive pulmonary disease, unspecified: Secondary | ICD-10-CM | POA: Diagnosis not present

## 2024-04-16 DIAGNOSIS — R531 Weakness: Secondary | ICD-10-CM | POA: Diagnosis not present

## 2024-04-16 DIAGNOSIS — I69391 Dysphagia following cerebral infarction: Secondary | ICD-10-CM | POA: Diagnosis not present

## 2024-04-16 DIAGNOSIS — R059 Cough, unspecified: Secondary | ICD-10-CM | POA: Diagnosis not present

## 2024-04-16 DIAGNOSIS — Z7409 Other reduced mobility: Secondary | ICD-10-CM | POA: Diagnosis not present

## 2024-04-17 DIAGNOSIS — M6281 Muscle weakness (generalized): Secondary | ICD-10-CM | POA: Diagnosis not present

## 2024-04-17 DIAGNOSIS — R059 Cough, unspecified: Secondary | ICD-10-CM | POA: Diagnosis not present

## 2024-04-17 DIAGNOSIS — I63541 Cerebral infarction due to unspecified occlusion or stenosis of right cerebellar artery: Secondary | ICD-10-CM | POA: Diagnosis not present

## 2024-04-17 DIAGNOSIS — I69391 Dysphagia following cerebral infarction: Secondary | ICD-10-CM | POA: Diagnosis not present

## 2024-04-18 DIAGNOSIS — R52 Pain, unspecified: Secondary | ICD-10-CM | POA: Diagnosis not present

## 2024-04-18 DIAGNOSIS — Z8673 Personal history of transient ischemic attack (TIA), and cerebral infarction without residual deficits: Secondary | ICD-10-CM | POA: Diagnosis not present

## 2024-04-18 DIAGNOSIS — J449 Chronic obstructive pulmonary disease, unspecified: Secondary | ICD-10-CM | POA: Diagnosis not present

## 2024-04-18 DIAGNOSIS — R197 Diarrhea, unspecified: Secondary | ICD-10-CM | POA: Diagnosis not present

## 2024-04-18 DIAGNOSIS — Z7409 Other reduced mobility: Secondary | ICD-10-CM | POA: Diagnosis not present

## 2024-04-18 DIAGNOSIS — R531 Weakness: Secondary | ICD-10-CM | POA: Diagnosis not present

## 2024-04-18 DIAGNOSIS — I69822 Dysarthria following other cerebrovascular disease: Secondary | ICD-10-CM | POA: Diagnosis not present

## 2024-04-18 DIAGNOSIS — I69391 Dysphagia following cerebral infarction: Secondary | ICD-10-CM | POA: Diagnosis not present

## 2024-04-18 DIAGNOSIS — E440 Moderate protein-calorie malnutrition: Secondary | ICD-10-CM | POA: Diagnosis not present

## 2024-04-19 DIAGNOSIS — M6281 Muscle weakness (generalized): Secondary | ICD-10-CM | POA: Diagnosis not present

## 2024-04-19 DIAGNOSIS — I63541 Cerebral infarction due to unspecified occlusion or stenosis of right cerebellar artery: Secondary | ICD-10-CM | POA: Diagnosis not present

## 2024-04-19 DIAGNOSIS — I69391 Dysphagia following cerebral infarction: Secondary | ICD-10-CM | POA: Diagnosis not present

## 2024-04-20 ENCOUNTER — Ambulatory Visit (HOSPITAL_COMMUNITY)
Admission: RE | Admit: 2024-04-20 | Discharge: 2024-04-20 | Disposition: A | Discharge status: Home / Self Care | Source: Ambulatory Visit

## 2024-04-20 DIAGNOSIS — I63541 Cerebral infarction due to unspecified occlusion or stenosis of right cerebellar artery: Secondary | ICD-10-CM | POA: Diagnosis not present

## 2024-04-20 DIAGNOSIS — M6281 Muscle weakness (generalized): Secondary | ICD-10-CM | POA: Diagnosis not present

## 2024-04-20 DIAGNOSIS — R6889 Other general symptoms and signs: Secondary | ICD-10-CM | POA: Diagnosis not present

## 2024-04-20 DIAGNOSIS — Z431 Encounter for attention to gastrostomy: Secondary | ICD-10-CM | POA: Insufficient documentation

## 2024-04-20 DIAGNOSIS — I69391 Dysphagia following cerebral infarction: Secondary | ICD-10-CM | POA: Diagnosis not present

## 2024-04-20 DIAGNOSIS — Z743 Need for continuous supervision: Secondary | ICD-10-CM | POA: Diagnosis not present

## 2024-04-20 DIAGNOSIS — Z7401 Bed confinement status: Secondary | ICD-10-CM | POA: Diagnosis not present

## 2024-04-20 DIAGNOSIS — R531 Weakness: Secondary | ICD-10-CM | POA: Diagnosis not present

## 2024-04-21 DIAGNOSIS — E860 Dehydration: Secondary | ICD-10-CM | POA: Diagnosis not present

## 2024-04-21 DIAGNOSIS — M6281 Muscle weakness (generalized): Secondary | ICD-10-CM | POA: Diagnosis not present

## 2024-04-21 DIAGNOSIS — I63541 Cerebral infarction due to unspecified occlusion or stenosis of right cerebellar artery: Secondary | ICD-10-CM | POA: Diagnosis not present

## 2024-04-21 DIAGNOSIS — Z8673 Personal history of transient ischemic attack (TIA), and cerebral infarction without residual deficits: Secondary | ICD-10-CM | POA: Diagnosis not present

## 2024-04-21 DIAGNOSIS — R531 Weakness: Secondary | ICD-10-CM | POA: Diagnosis not present

## 2024-04-21 DIAGNOSIS — I69391 Dysphagia following cerebral infarction: Secondary | ICD-10-CM | POA: Diagnosis not present

## 2024-04-21 DIAGNOSIS — E440 Moderate protein-calorie malnutrition: Secondary | ICD-10-CM | POA: Diagnosis not present

## 2024-04-21 DIAGNOSIS — Z7409 Other reduced mobility: Secondary | ICD-10-CM | POA: Diagnosis not present

## 2024-04-21 DIAGNOSIS — R638 Other symptoms and signs concerning food and fluid intake: Secondary | ICD-10-CM | POA: Diagnosis not present

## 2024-04-21 DIAGNOSIS — R5381 Other malaise: Secondary | ICD-10-CM | POA: Diagnosis not present

## 2024-04-22 DIAGNOSIS — R1310 Dysphagia, unspecified: Secondary | ICD-10-CM | POA: Diagnosis not present

## 2024-04-22 DIAGNOSIS — Z79891 Long term (current) use of opiate analgesic: Secondary | ICD-10-CM | POA: Diagnosis not present

## 2024-04-22 DIAGNOSIS — R531 Weakness: Secondary | ICD-10-CM | POA: Diagnosis not present

## 2024-04-22 DIAGNOSIS — E440 Moderate protein-calorie malnutrition: Secondary | ICD-10-CM | POA: Diagnosis not present

## 2024-04-22 DIAGNOSIS — Z931 Gastrostomy status: Secondary | ICD-10-CM | POA: Diagnosis not present

## 2024-04-22 DIAGNOSIS — I63541 Cerebral infarction due to unspecified occlusion or stenosis of right cerebellar artery: Secondary | ICD-10-CM | POA: Diagnosis not present

## 2024-04-22 DIAGNOSIS — Z7409 Other reduced mobility: Secondary | ICD-10-CM | POA: Diagnosis not present

## 2024-04-22 DIAGNOSIS — J449 Chronic obstructive pulmonary disease, unspecified: Secondary | ICD-10-CM | POA: Diagnosis not present

## 2024-04-22 DIAGNOSIS — M6281 Muscle weakness (generalized): Secondary | ICD-10-CM | POA: Diagnosis not present

## 2024-04-22 DIAGNOSIS — R638 Other symptoms and signs concerning food and fluid intake: Secondary | ICD-10-CM | POA: Diagnosis not present

## 2024-04-22 DIAGNOSIS — R5381 Other malaise: Secondary | ICD-10-CM | POA: Diagnosis not present

## 2024-04-22 DIAGNOSIS — G3184 Mild cognitive impairment, so stated: Secondary | ICD-10-CM | POA: Diagnosis not present

## 2024-04-22 DIAGNOSIS — I69391 Dysphagia following cerebral infarction: Secondary | ICD-10-CM | POA: Diagnosis not present

## 2024-04-22 DIAGNOSIS — Z8673 Personal history of transient ischemic attack (TIA), and cerebral infarction without residual deficits: Secondary | ICD-10-CM | POA: Diagnosis not present

## 2024-04-23 DIAGNOSIS — M6281 Muscle weakness (generalized): Secondary | ICD-10-CM | POA: Diagnosis not present

## 2024-04-23 DIAGNOSIS — I63541 Cerebral infarction due to unspecified occlusion or stenosis of right cerebellar artery: Secondary | ICD-10-CM | POA: Diagnosis not present

## 2024-04-23 DIAGNOSIS — I69391 Dysphagia following cerebral infarction: Secondary | ICD-10-CM | POA: Diagnosis not present

## 2024-04-24 DIAGNOSIS — I69391 Dysphagia following cerebral infarction: Secondary | ICD-10-CM | POA: Diagnosis not present

## 2024-04-24 DIAGNOSIS — M6281 Muscle weakness (generalized): Secondary | ICD-10-CM | POA: Diagnosis not present

## 2024-04-25 DIAGNOSIS — M6281 Muscle weakness (generalized): Secondary | ICD-10-CM | POA: Diagnosis not present

## 2024-04-25 DIAGNOSIS — I69391 Dysphagia following cerebral infarction: Secondary | ICD-10-CM | POA: Diagnosis not present

## 2024-04-26 DIAGNOSIS — I63541 Cerebral infarction due to unspecified occlusion or stenosis of right cerebellar artery: Secondary | ICD-10-CM | POA: Diagnosis not present

## 2024-04-26 DIAGNOSIS — M6281 Muscle weakness (generalized): Secondary | ICD-10-CM | POA: Diagnosis not present

## 2024-04-26 DIAGNOSIS — I69391 Dysphagia following cerebral infarction: Secondary | ICD-10-CM | POA: Diagnosis not present

## 2024-04-27 DIAGNOSIS — I69391 Dysphagia following cerebral infarction: Secondary | ICD-10-CM | POA: Diagnosis not present

## 2024-04-27 DIAGNOSIS — Z7409 Other reduced mobility: Secondary | ICD-10-CM | POA: Diagnosis not present

## 2024-04-27 DIAGNOSIS — R638 Other symptoms and signs concerning food and fluid intake: Secondary | ICD-10-CM | POA: Diagnosis not present

## 2024-04-27 DIAGNOSIS — R52 Pain, unspecified: Secondary | ICD-10-CM | POA: Diagnosis not present

## 2024-04-27 DIAGNOSIS — M6281 Muscle weakness (generalized): Secondary | ICD-10-CM | POA: Diagnosis not present

## 2024-04-28 DIAGNOSIS — I69391 Dysphagia following cerebral infarction: Secondary | ICD-10-CM | POA: Diagnosis not present

## 2024-04-28 DIAGNOSIS — M6281 Muscle weakness (generalized): Secondary | ICD-10-CM | POA: Diagnosis not present

## 2024-04-29 DIAGNOSIS — R638 Other symptoms and signs concerning food and fluid intake: Secondary | ICD-10-CM | POA: Diagnosis not present

## 2024-04-29 DIAGNOSIS — Z7409 Other reduced mobility: Secondary | ICD-10-CM | POA: Diagnosis not present

## 2024-04-29 DIAGNOSIS — I69391 Dysphagia following cerebral infarction: Secondary | ICD-10-CM | POA: Diagnosis not present

## 2024-04-29 DIAGNOSIS — E440 Moderate protein-calorie malnutrition: Secondary | ICD-10-CM | POA: Diagnosis not present

## 2024-04-29 DIAGNOSIS — R52 Pain, unspecified: Secondary | ICD-10-CM | POA: Diagnosis not present

## 2024-04-29 DIAGNOSIS — R5381 Other malaise: Secondary | ICD-10-CM | POA: Diagnosis not present

## 2024-04-29 DIAGNOSIS — M6281 Muscle weakness (generalized): Secondary | ICD-10-CM | POA: Diagnosis not present

## 2024-05-01 DIAGNOSIS — I69391 Dysphagia following cerebral infarction: Secondary | ICD-10-CM | POA: Diagnosis not present

## 2024-05-01 DIAGNOSIS — E440 Moderate protein-calorie malnutrition: Secondary | ICD-10-CM | POA: Diagnosis not present

## 2024-05-01 DIAGNOSIS — I69822 Dysarthria following other cerebrovascular disease: Secondary | ICD-10-CM | POA: Diagnosis not present

## 2024-05-01 DIAGNOSIS — J309 Allergic rhinitis, unspecified: Secondary | ICD-10-CM | POA: Diagnosis not present

## 2024-05-01 DIAGNOSIS — J449 Chronic obstructive pulmonary disease, unspecified: Secondary | ICD-10-CM | POA: Diagnosis not present

## 2024-05-01 DIAGNOSIS — Z8673 Personal history of transient ischemic attack (TIA), and cerebral infarction without residual deficits: Secondary | ICD-10-CM | POA: Diagnosis not present

## 2024-05-01 DIAGNOSIS — N1831 Chronic kidney disease, stage 3a: Secondary | ICD-10-CM | POA: Diagnosis not present

## 2024-05-01 DIAGNOSIS — I509 Heart failure, unspecified: Secondary | ICD-10-CM | POA: Diagnosis not present

## 2024-05-01 DIAGNOSIS — E1165 Type 2 diabetes mellitus with hyperglycemia: Secondary | ICD-10-CM | POA: Diagnosis not present

## 2024-05-01 DIAGNOSIS — E785 Hyperlipidemia, unspecified: Secondary | ICD-10-CM | POA: Diagnosis not present

## 2024-05-06 DIAGNOSIS — G3184 Mild cognitive impairment, so stated: Secondary | ICD-10-CM | POA: Diagnosis not present

## 2024-05-06 DIAGNOSIS — I69391 Dysphagia following cerebral infarction: Secondary | ICD-10-CM | POA: Diagnosis not present

## 2024-05-06 DIAGNOSIS — G8929 Other chronic pain: Secondary | ICD-10-CM | POA: Diagnosis not present

## 2024-05-06 DIAGNOSIS — R531 Weakness: Secondary | ICD-10-CM | POA: Diagnosis not present

## 2024-05-06 DIAGNOSIS — Z8709 Personal history of other diseases of the respiratory system: Secondary | ICD-10-CM | POA: Diagnosis not present

## 2024-05-06 DIAGNOSIS — Z8673 Personal history of transient ischemic attack (TIA), and cerebral infarction without residual deficits: Secondary | ICD-10-CM | POA: Diagnosis not present

## 2024-05-07 DIAGNOSIS — G8929 Other chronic pain: Secondary | ICD-10-CM | POA: Diagnosis not present

## 2024-05-07 DIAGNOSIS — Z8709 Personal history of other diseases of the respiratory system: Secondary | ICD-10-CM | POA: Diagnosis not present

## 2024-05-07 DIAGNOSIS — R531 Weakness: Secondary | ICD-10-CM | POA: Diagnosis not present

## 2024-05-07 DIAGNOSIS — Z8673 Personal history of transient ischemic attack (TIA), and cerebral infarction without residual deficits: Secondary | ICD-10-CM | POA: Diagnosis not present

## 2024-05-07 DIAGNOSIS — I69391 Dysphagia following cerebral infarction: Secondary | ICD-10-CM | POA: Diagnosis not present

## 2024-06-04 ENCOUNTER — Telehealth: Payer: Self-pay

## 2024-06-04 NOTE — Patient Outreach (Signed)
 Telephone outreach to patient's Niece to obtain mRS was successfully completed. MRS= 5  Shereen Gin [facility] VBCI Assistant Direct Dial: [phone]  Fax: [phone] Website: [email or web address]
